# Patient Record
Sex: Female | Born: 1950
Health system: Southern US, Community
[De-identification: ages and names within clinical notes are randomized; demographics above are authoritative.]

## PROBLEM LIST (undated history)

## (undated) DIAGNOSIS — I350 Nonrheumatic aortic (valve) stenosis: Secondary | ICD-10-CM

## (undated) DIAGNOSIS — I4819 Other persistent atrial fibrillation: Secondary | ICD-10-CM

## (undated) DIAGNOSIS — H9190 Unspecified hearing loss, unspecified ear: Secondary | ICD-10-CM

## (undated) DIAGNOSIS — I10 Essential (primary) hypertension: Secondary | ICD-10-CM

## (undated) DIAGNOSIS — E039 Hypothyroidism, unspecified: Secondary | ICD-10-CM

## (undated) DIAGNOSIS — I499 Cardiac arrhythmia, unspecified: Secondary | ICD-10-CM

## (undated) DIAGNOSIS — Z87442 Personal history of urinary calculi: Secondary | ICD-10-CM

## (undated) DIAGNOSIS — N200 Calculus of kidney: Secondary | ICD-10-CM

## (undated) DIAGNOSIS — G20 Parkinson's disease: Secondary | ICD-10-CM

## (undated) DIAGNOSIS — R319 Hematuria, unspecified: Secondary | ICD-10-CM

## (undated) DIAGNOSIS — Z8719 Personal history of other diseases of the digestive system: Secondary | ICD-10-CM

## (undated) DIAGNOSIS — R0600 Dyspnea, unspecified: Secondary | ICD-10-CM

## (undated) DIAGNOSIS — G20A1 Parkinson's disease without dyskinesia, without mention of fluctuations: Secondary | ICD-10-CM

## (undated) DIAGNOSIS — K219 Gastro-esophageal reflux disease without esophagitis: Secondary | ICD-10-CM

## (undated) DIAGNOSIS — I4891 Unspecified atrial fibrillation: Secondary | ICD-10-CM

## (undated) DIAGNOSIS — G4730 Sleep apnea, unspecified: Secondary | ICD-10-CM

## (undated) DIAGNOSIS — E785 Hyperlipidemia, unspecified: Secondary | ICD-10-CM

## (undated) DIAGNOSIS — R011 Cardiac murmur, unspecified: Secondary | ICD-10-CM

## (undated) HISTORY — DX: Nonrheumatic aortic (valve) stenosis: I35.0

## (undated) HISTORY — PX: TONSILLECTOMY: SUR1361

## (undated) HISTORY — PX: KNEE ARTHROSCOPY: SUR90

## (undated) HISTORY — PX: VEIN LIGATION: SHX2652

## (undated) HISTORY — DX: Other persistent atrial fibrillation: I48.19

## (undated) HISTORY — DX: Hyperlipidemia, unspecified: E78.5

## (undated) HISTORY — DX: Essential (primary) hypertension: I10

## (undated) HISTORY — PX: ABDOMINAL HYSTERECTOMY: SHX81

## (undated) HISTORY — DX: Gastro-esophageal reflux disease without esophagitis: K21.9

---
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
## 1999-11-28 ENCOUNTER — Encounter: Payer: Self-pay | Admitting: Family Medicine

## 1999-11-28 ENCOUNTER — Encounter: Admission: RE | Admit: 1999-11-28 | Discharge: 1999-11-28 | Payer: Self-pay | Admitting: Family Medicine

## 2000-12-02 ENCOUNTER — Encounter: Payer: Self-pay | Admitting: Internal Medicine

## 2000-12-02 ENCOUNTER — Encounter: Admission: RE | Admit: 2000-12-02 | Discharge: 2000-12-02 | Payer: Self-pay | Admitting: Internal Medicine

## 2001-12-04 ENCOUNTER — Encounter: Admission: RE | Admit: 2001-12-04 | Discharge: 2001-12-04 | Payer: Self-pay | Admitting: Internal Medicine

## 2001-12-04 ENCOUNTER — Encounter: Payer: Self-pay | Admitting: Internal Medicine

## 2003-03-18 ENCOUNTER — Other Ambulatory Visit: Admission: RE | Admit: 2003-03-18 | Discharge: 2003-03-18 | Payer: Self-pay | Admitting: Family Medicine

## 2003-08-12 ENCOUNTER — Ambulatory Visit (HOSPITAL_COMMUNITY): Admission: RE | Admit: 2003-08-12 | Discharge: 2003-08-12 | Payer: Self-pay | Admitting: Family Medicine

## 2004-04-02 ENCOUNTER — Ambulatory Visit: Payer: Self-pay | Admitting: Family Medicine

## 2004-05-16 ENCOUNTER — Ambulatory Visit: Payer: Self-pay

## 2004-09-18 ENCOUNTER — Encounter: Admission: RE | Admit: 2004-09-18 | Discharge: 2004-09-18 | Payer: Self-pay | Admitting: Family Medicine

## 2004-10-08 ENCOUNTER — Ambulatory Visit: Payer: Self-pay | Admitting: Family Medicine

## 2005-09-23 ENCOUNTER — Encounter: Admission: RE | Admit: 2005-09-23 | Discharge: 2005-09-23 | Payer: Self-pay | Admitting: Family Medicine

## 2005-10-11 ENCOUNTER — Ambulatory Visit: Payer: Self-pay | Admitting: Family Medicine

## 2005-10-11 ENCOUNTER — Other Ambulatory Visit: Admission: RE | Admit: 2005-10-11 | Discharge: 2005-10-11 | Payer: Self-pay | Admitting: Family Medicine

## 2005-10-14 ENCOUNTER — Ambulatory Visit: Payer: Self-pay | Admitting: Family Medicine

## 2006-01-22 ENCOUNTER — Ambulatory Visit: Payer: Self-pay | Admitting: Family Medicine

## 2006-01-22 LAB — CONVERTED CEMR LAB: TSH: 3.56 microintl units/mL (ref 0.35–5.50)

## 2006-02-12 ENCOUNTER — Ambulatory Visit: Payer: Self-pay | Admitting: Family Medicine

## 2006-08-28 ENCOUNTER — Telehealth (INDEPENDENT_AMBULATORY_CARE_PROVIDER_SITE_OTHER): Payer: Self-pay | Admitting: *Deleted

## 2006-10-06 ENCOUNTER — Ambulatory Visit (HOSPITAL_COMMUNITY): Admission: RE | Admit: 2006-10-06 | Discharge: 2006-10-06 | Payer: Self-pay | Admitting: Family Medicine

## 2006-10-06 ENCOUNTER — Ambulatory Visit: Payer: Self-pay | Admitting: Family Medicine

## 2006-10-08 ENCOUNTER — Encounter (INDEPENDENT_AMBULATORY_CARE_PROVIDER_SITE_OTHER): Payer: Self-pay | Admitting: *Deleted

## 2006-10-08 LAB — CONVERTED CEMR LAB
BUN: 19 mg/dL (ref 6–23)
Basophils Relative: 0.8 % (ref 0.0–1.0)
Bilirubin, Direct: 0.1 mg/dL (ref 0.0–0.3)
CO2: 28 meq/L (ref 19–32)
Eosinophils Relative: 3.4 % (ref 0.0–5.0)
GFR calc Af Amer: 73 mL/min
Glucose, Bld: 103 mg/dL — ABNORMAL HIGH (ref 70–99)
Hemoglobin: 14.2 g/dL (ref 12.0–15.0)
Lymphocytes Relative: 25.9 % (ref 12.0–46.0)
Monocytes Absolute: 0.6 10*3/uL (ref 0.2–0.7)
Monocytes Relative: 10.4 % (ref 3.0–11.0)
Neutro Abs: 3.7 10*3/uL (ref 1.4–7.7)
Potassium: 4.5 meq/L (ref 3.5–5.1)
Sodium: 142 meq/L (ref 135–145)
TSH: 4.21 microintl units/mL (ref 0.35–5.50)
Total Protein: 6.4 g/dL (ref 6.0–8.3)

## 2007-03-25 ENCOUNTER — Encounter (INDEPENDENT_AMBULATORY_CARE_PROVIDER_SITE_OTHER): Payer: Self-pay | Admitting: *Deleted

## 2007-05-01 ENCOUNTER — Telehealth (INDEPENDENT_AMBULATORY_CARE_PROVIDER_SITE_OTHER): Payer: Self-pay | Admitting: *Deleted

## 2007-10-07 ENCOUNTER — Ambulatory Visit (HOSPITAL_COMMUNITY): Admission: RE | Admit: 2007-10-07 | Discharge: 2007-10-07 | Payer: Self-pay | Admitting: *Deleted

## 2008-10-12 ENCOUNTER — Ambulatory Visit (HOSPITAL_COMMUNITY): Admission: RE | Admit: 2008-10-12 | Discharge: 2008-10-12 | Payer: Self-pay | Admitting: Family Medicine

## 2009-10-24 ENCOUNTER — Ambulatory Visit (HOSPITAL_COMMUNITY): Admission: RE | Admit: 2009-10-24 | Discharge: 2009-10-24 | Payer: Self-pay | Admitting: Family Medicine

## 2011-04-16 ENCOUNTER — Other Ambulatory Visit (HOSPITAL_COMMUNITY): Payer: Self-pay | Admitting: Family Medicine

## 2011-04-16 DIAGNOSIS — Z1231 Encounter for screening mammogram for malignant neoplasm of breast: Secondary | ICD-10-CM

## 2011-05-09 ENCOUNTER — Ambulatory Visit (HOSPITAL_COMMUNITY)
Admission: RE | Admit: 2011-05-09 | Discharge: 2011-05-09 | Disposition: A | Discharge status: Home / Self Care | Payer: BC Managed Care – PPO | Source: Ambulatory Visit | Attending: Family Medicine | Admitting: Family Medicine

## 2011-05-09 DIAGNOSIS — Z1231 Encounter for screening mammogram for malignant neoplasm of breast: Secondary | ICD-10-CM | POA: Insufficient documentation

## 2012-01-30 ENCOUNTER — Other Ambulatory Visit: Payer: Self-pay | Admitting: Family Medicine

## 2012-01-30 DIAGNOSIS — R1032 Left lower quadrant pain: Secondary | ICD-10-CM

## 2012-02-03 ENCOUNTER — Ambulatory Visit
Admission: RE | Admit: 2012-02-03 | Discharge: 2012-02-03 | Disposition: A | Discharge status: Home / Self Care | Payer: BC Managed Care – PPO | Source: Ambulatory Visit | Attending: Family Medicine | Admitting: Family Medicine

## 2012-02-03 DIAGNOSIS — R1032 Left lower quadrant pain: Secondary | ICD-10-CM

## 2012-02-03 MED ORDER — IOHEXOL 300 MG/ML  SOLN
125.0000 mL | Freq: Once | INTRAMUSCULAR | Status: AC | PRN
  Administered 2012-02-03: 125 mL via INTRAVENOUS

## 2012-02-10 ENCOUNTER — Other Ambulatory Visit: Payer: Self-pay | Admitting: Urology

## 2012-02-25 ENCOUNTER — Encounter (HOSPITAL_BASED_OUTPATIENT_CLINIC_OR_DEPARTMENT_OTHER): Payer: Self-pay | Admitting: *Deleted

## 2012-02-25 NOTE — Progress Notes (Signed)
Pt instructed npo p mn 02/27/12 x omeprazole, bisoprolol, levothyroxine w sip of water.  Ok to take pain med prn w sip of water.  To [facility] 1/3 @ 1100.  Needs istat, ekg on arrival.

## 2012-02-28 ENCOUNTER — Ambulatory Visit (HOSPITAL_BASED_OUTPATIENT_CLINIC_OR_DEPARTMENT_OTHER)
Admission: RE | Admit: 2012-02-28 | Discharge: 2012-02-28 | Disposition: A | Discharge status: Home / Self Care | Payer: BC Managed Care – PPO | Source: Ambulatory Visit | Attending: Urology | Admitting: Urology

## 2012-02-28 ENCOUNTER — Encounter (HOSPITAL_BASED_OUTPATIENT_CLINIC_OR_DEPARTMENT_OTHER): Payer: Self-pay | Admitting: Certified Registered"

## 2012-02-28 ENCOUNTER — Encounter (HOSPITAL_BASED_OUTPATIENT_CLINIC_OR_DEPARTMENT_OTHER)
Admission: RE | Disposition: A | Discharge status: Home / Self Care | Payer: Self-pay | Source: Ambulatory Visit | Attending: Urology

## 2012-02-28 ENCOUNTER — Ambulatory Visit (HOSPITAL_BASED_OUTPATIENT_CLINIC_OR_DEPARTMENT_OTHER): Payer: BC Managed Care – PPO | Admitting: Certified Registered"

## 2012-02-28 ENCOUNTER — Other Ambulatory Visit: Payer: Self-pay

## 2012-02-28 DIAGNOSIS — R319 Hematuria, unspecified: Secondary | ICD-10-CM | POA: Insufficient documentation

## 2012-02-28 DIAGNOSIS — I10 Essential (primary) hypertension: Secondary | ICD-10-CM | POA: Insufficient documentation

## 2012-02-28 DIAGNOSIS — Z01810 Encounter for preprocedural cardiovascular examination: Secondary | ICD-10-CM | POA: Insufficient documentation

## 2012-02-28 DIAGNOSIS — N200 Calculus of kidney: Secondary | ICD-10-CM | POA: Insufficient documentation

## 2012-02-28 HISTORY — DX: Hematuria, unspecified: R31.9

## 2012-02-28 HISTORY — PX: CYSTOSCOPY/RETROGRADE/URETEROSCOPY/STONE EXTRACTION WITH BASKET: SHX5317

## 2012-02-28 HISTORY — PX: CYSTOSCOPY WITH STENT PLACEMENT: SHX5790

## 2012-02-28 HISTORY — DX: Cardiac murmur, unspecified: R01.1

## 2012-02-28 HISTORY — PX: HOLMIUM LASER APPLICATION: SHX5852

## 2012-02-28 HISTORY — DX: Hypothyroidism, unspecified: E03.9

## 2012-02-28 HISTORY — DX: Calculus of kidney: N20.0

## 2012-02-28 HISTORY — DX: Unspecified hearing loss, unspecified ear: H91.90

## 2012-02-28 HISTORY — PX: CYSTOSCOPY W/ RETROGRADES: SHX1426

## 2012-02-28 HISTORY — DX: Personal history of other diseases of the digestive system: Z87.19

## 2012-02-28 LAB — POCT I-STAT, CHEM 8
BUN: 15 mg/dL (ref 6–23)
Chloride: 105 mEq/L (ref 96–112)
Creatinine, Ser: 1 mg/dL (ref 0.50–1.10)
Potassium: 4 mEq/L (ref 3.5–5.1)
Sodium: 141 mEq/L (ref 135–145)

## 2012-02-28 SURGERY — HOLMIUM LASER APPLICATION
Anesthesia: General | Site: Ureter | Laterality: Right | Wound class: Clean Contaminated

## 2012-02-28 MED ORDER — FENTANYL CITRATE 0.05 MG/ML IJ SOLN
INTRAMUSCULAR | Status: DC | PRN
  Administered 2012-02-28: 50 ug via INTRAVENOUS
  Administered 2012-02-28 (×2): 25 ug via INTRAVENOUS

## 2012-02-28 MED ORDER — MIDAZOLAM HCL 5 MG/5ML IJ SOLN
INTRAMUSCULAR | Status: DC | PRN
  Administered 2012-02-28: 2 mg via INTRAVENOUS

## 2012-02-28 MED ORDER — OXYBUTYNIN CHLORIDE 5 MG PO TABS: 5.0000 mg | ORAL_TABLET | Freq: Three times a day (TID) | ORAL | Status: DC | PRN

## 2012-02-28 MED ORDER — DEXAMETHASONE SODIUM PHOSPHATE 4 MG/ML IJ SOLN
INTRAMUSCULAR | Status: DC | PRN
  Administered 2012-02-28: 8 mg via INTRAVENOUS

## 2012-02-28 MED ORDER — LACTATED RINGERS IV SOLN
INTRAVENOUS | Status: DC
  Filled 2012-02-28: qty 1000

## 2012-02-28 MED ORDER — SENNA-DOCUSATE SODIUM 8.6-50 MG PO TABS: 1.0000 | ORAL_TABLET | Freq: Two times a day (BID) | ORAL | Status: DC

## 2012-02-28 MED ORDER — PROMETHAZINE HCL 25 MG/ML IJ SOLN
6.2500 mg | INTRAMUSCULAR | Status: DC | PRN
  Filled 2012-02-28: qty 1

## 2012-02-28 MED ORDER — PROPOFOL 10 MG/ML IV BOLUS
INTRAVENOUS | Status: DC | PRN
  Administered 2012-02-28: 180 mg via INTRAVENOUS

## 2012-02-28 MED ORDER — GENTAMICIN IN SALINE 1.6-0.9 MG/ML-% IV SOLN
80.0000 mg | INTRAVENOUS | Status: AC
  Administered 2012-02-28: 400 mg via INTRAVENOUS
  Filled 2012-02-28: qty 50

## 2012-02-28 MED ORDER — KETOROLAC TROMETHAMINE 30 MG/ML IJ SOLN
INTRAMUSCULAR | Status: DC | PRN
  Administered 2012-02-28: 30 mg via INTRAVENOUS

## 2012-02-28 MED ORDER — FENTANYL CITRATE 0.05 MG/ML IJ SOLN
25.0000 ug | INTRAMUSCULAR | Status: DC | PRN
  Filled 2012-02-28: qty 1

## 2012-02-28 MED ORDER — ONDANSETRON HCL 4 MG/2ML IJ SOLN
INTRAMUSCULAR | Status: DC | PRN
  Administered 2012-02-28: 4 mg via INTRAVENOUS

## 2012-02-28 MED ORDER — SODIUM CHLORIDE 0.9 % IR SOLN
Status: DC | PRN
  Administered 2012-02-28: 3000 mL

## 2012-02-28 MED ORDER — IOHEXOL 350 MG/ML SOLN
INTRAVENOUS | Status: DC | PRN
  Administered 2012-02-28: 5 mL

## 2012-02-28 MED ORDER — OXYCODONE-ACETAMINOPHEN 5-325 MG PO TABS: 1.0000 | ORAL_TABLET | ORAL | Status: DC | PRN

## 2012-02-28 MED ORDER — LIDOCAINE HCL (CARDIAC) 20 MG/ML IV SOLN
INTRAVENOUS | Status: DC | PRN
  Administered 2012-02-28: 60 mg via INTRAVENOUS

## 2012-02-28 MED ORDER — LACTATED RINGERS IV SOLN
INTRAVENOUS | Status: DC
  Administered 2012-02-28 (×2): via INTRAVENOUS
  Filled 2012-02-28: qty 1000

## 2012-02-28 SURGICAL SUPPLY — 37 items
ADAPTER CATH URET PLST 4-6FR (CATHETERS) ×4 IMPLANT
BAG DRAIN URO-CYSTO SKYTR STRL (DRAIN) ×4 IMPLANT
BAG URO CATCHER STRL LF (DRAPE) ×4 IMPLANT
BASKET LASER NITINOL 1.9FR (BASKET) ×4 IMPLANT
BASKET STNLS GEMINI 4WIRE 3FR (BASKET) IMPLANT
BASKET ZERO TIP NITINOL 2.4FR (BASKET) IMPLANT
CANISTER SUCT LVC 12 LTR MEDI- (MISCELLANEOUS) ×4 IMPLANT
CATH FOLEY 2WAY  3CC  8FR (CATHETERS)
CATH FOLEY 2WAY 3CC 8FR (CATHETERS) IMPLANT
CATH INTERMIT  6FR 70CM (CATHETERS) ×4 IMPLANT
CLOTH BEACON ORANGE TIMEOUT ST (SAFETY) ×4 IMPLANT
DRAPE CAMERA CLOSED 9X96 (DRAPES) ×4 IMPLANT
ELECT REM PT RETURN 9FT ADLT (ELECTROSURGICAL)
ELECTRODE REM PT RTRN 9FT ADLT (ELECTROSURGICAL) IMPLANT
FLEXIVA TRACTIP ×4 IMPLANT
GLOVE BIO SURGEON STRL SZ 6.5 (GLOVE) ×4 IMPLANT
GLOVE BIO SURGEON STRL SZ7 (GLOVE) ×4 IMPLANT
GLOVE BIO SURGEON STRL SZ7.5 (GLOVE) ×4 IMPLANT
GLOVE INDICATOR 7.0 STRL GRN (GLOVE) ×8 IMPLANT
GOWN PREVENTION PLUS LG XLONG (DISPOSABLE) ×8 IMPLANT
GOWN STRL REIN XL XLG (GOWN DISPOSABLE) ×4 IMPLANT
GUIDEWIRE 0.038 PTFE COATED (WIRE) IMPLANT
GUIDEWIRE ANG ZIPWIRE 038X150 (WIRE) ×4 IMPLANT
GUIDEWIRE STR DUAL SENSOR (WIRE) ×4 IMPLANT
IV NS IRRIG 3000ML ARTHROMATIC (IV SOLUTION) ×4 IMPLANT
KIT BALLIN UROMAX 15FX10 (LABEL) IMPLANT
KIT BALLN UROMAX 15FX4 (MISCELLANEOUS) IMPLANT
KIT BALLN UROMAX 26 75X4 (MISCELLANEOUS)
PACK CYSTOSCOPY (CUSTOM PROCEDURE TRAY) ×4 IMPLANT
SET HIGH PRES BAL DIL (LABEL)
SHEATH ACCESS URETERAL 38CM (SHEATH) ×4 IMPLANT
SHEATH URET ACCESS 12FR/35CM (UROLOGICAL SUPPLIES) IMPLANT
SHEATH URET ACCESS 12FR/55CM (UROLOGICAL SUPPLIES) IMPLANT
STENT CONTOUR 6FRX24X.038 (STENTS) IMPLANT
STENT URET 6FRX24 CONTOUR (STENTS) ×4 IMPLANT
SYRINGE 10CC LL (SYRINGE) ×4 IMPLANT
SYRINGE IRR TOOMEY STRL 70CC (SYRINGE) IMPLANT

## 2012-02-28 NOTE — Anesthesia Preprocedure Evaluation (Signed)
Anesthesia Evaluation  Patient identified by MRN, date of birth, ID band Patient awake    Reviewed: Allergy & Precautions, H&P , NPO status , Patient's Chart, lab work & pertinent test results  Airway Mallampati: III TM Distance: >3 FB Neck ROM: Full    Dental  (+) Teeth Intact and Dental Advisory Given   Pulmonary neg pulmonary ROS,  breath sounds clear to auscultation  Pulmonary exam normal       Cardiovascular negative cardio ROS      Neuro/Psych negative neurological ROS  negative psych ROS   GI/Hepatic negative GI ROS, Neg liver ROS, hiatal hernia, GERD-  ,  Endo/Other  Hypothyroidism Morbid obesity  Renal/GU negative Renal ROS  negative genitourinary   Musculoskeletal negative musculoskeletal ROS (+) Fibromyalgia -  Abdominal (+) + obese,   Peds negative pediatric ROS (+)  Hematology negative hematology ROS (+)   Anesthesia Other Findings   Reproductive/Obstetrics negative OB ROS                           Anesthesia Physical Anesthesia Plan  ASA: III  Anesthesia Plan: General   Post-op Pain Management:    Induction: Intravenous  Airway Management Planned: LMA  Additional Equipment:   Intra-op Plan:   Post-operative Plan: Extubation in OR  Informed Consent: I have reviewed the patients History and Physical, chart, labs and discussed the procedure including the risks, benefits and alternatives for the proposed anesthesia with the patient or authorized representative who has indicated his/her understanding and acceptance.   Dental advisory given  Plan Discussed with: CRNA  Anesthesia Plan Comments:         Anesthesia Quick Evaluation

## 2012-02-28 NOTE — H&P (Signed)
Cindy Richmond is an 62 y.o. female.   Chief Complaint: Pre-Op Left Ureteroscopic Stone Manipulation  HPI:   1 - Left Renal Stone / Flank Pain / Hematuria -  Pt with 11mm left UPJ stone, 1100HU found by PCP on work-up of intermitant hematuria and flank pain on/off x several monthes. No prior stone events. No concomitant stones. Pain very infreqeuny, bothers for a day every few mos then goes away.  2 - Gross Hematuria - Intermitant gross blood on/off x last 6-94mos. Contrast CT abdomen (no delayed phase) 01/2012 wtih no large GU masses, but ureters / bladder not yet evaluated. Non-smoker. No dye / chemical exposure.   PMH sig for obesity, OA, hyothyroid, HTN. No CV disease. No strong blood thinners.  PT initially opted for observation of left stone, but has since decided for treatment given ongoing intermittent flank pain and blood.   Past Medical History  Diagnosis Date  . Hypothyroidism   . Kidney stone on left side   . Heart murmur     asymptomatic  . Fibromyalgia   . H/O hiatal hernia   . Hematuria   . HOH (hard of hearing)     sightly, bilaterally    Past Surgical History  Procedure Date  . Tonsillectomy   . Knee arthroscopy     right  . Abdominal hysterectomy     lso  . Vein ligation     History reviewed. No pertinent family history. Social History:  reports that she has never smoked. She does not have any smokeless tobacco history on file. She reports that she does not drink alcohol. Her drug history not on file.  Allergies:  Allergies  Allergen Reactions  . Penicillins Other (See Comments)    unknown    No prescriptions prior to admission    No results found for this or any previous visit (from the past 48 hour(s)). No results found.  Review of Systems  Constitutional: Negative.  Negative for fever and chills.  HENT: Negative.   Eyes: Negative.   Respiratory: Negative.   Cardiovascular: Negative.   Gastrointestinal: Negative.   Genitourinary: Positive  for hematuria and flank pain. Negative for dysuria.  Musculoskeletal: Negative.   Skin: Negative.   Neurological: Negative.   Endo/Heme/Allergies: Negative.  Does not bruise/bleed easily.  Psychiatric/Behavioral: Negative.     Height 5\' 6"  (1.676 m), weight 113.399 kg (250 lb). Physical Exam  Constitutional: She is oriented to person, place, and time. She appears well-developed and well-nourished.       obese  HENT:  Head: Normocephalic and atraumatic.  Eyes: EOM are normal. Pupils are equal, round, and reactive to light.  Neck: Normal range of motion. Neck supple.  Cardiovascular: Normal rate and regular rhythm.   Respiratory: Effort normal and breath sounds normal.  GI: Soft. Bowel sounds are normal.  Genitourinary:       Minimal CVAT  Musculoskeletal: Normal range of motion.  Neurological: She is alert and oriented to person, place, and time.  Skin: Skin is warm and dry.  Psychiatric: She has a normal mood and affect. Her behavior is normal. Judgment and thought content normal.     Assessment/Plan  1 - Left Renal Stone / Flank Pain / Hematuria -  LIkely source of intermitatn pain and hematuria. Not passable size. Pt body habitus and stone density make success of SWL not great.  We re-discussed treatment approaches to renal stones including observation, medical expulsive therapy (MET), ureteroscopy (URS), shockwave lithotripsy (SWL), and percutaneous  nephrostolithotomy (PCNL) with the later preferred for cases of larger stones or in cases of unique / complex anatomy. We discussed the relative merits of the techniques including risks, benefits, and predicted efficacy with PCNL, URS, and SWL being presented in order of decreasing efficacy, but also decreasing risk in most patients.  After careful consideration, the patient has elected to undergo URS with cysto / bilateral retrogrades to treat her stone and complete hematuria evaluation.   We re-discussed ureteroscopic stone  manipulation with basketing and laser-lithotripsy in detail.  We discussed risks including bleeding, infection, damage to kidney / ureter  bladder, rarely loss of kidney. We discussed anesthetic risks and rare but serious surgical complications including DVT, PE, MI, and mortality. We specifically addressed that in 5-10% of cases a staged approach is required with stenting followed by re-attempt ureteroscopy if anatomy unfavorable. The patient voiced understanding and wises to proceed.   Siyah Mault 02/28/2012, 6:38 AM

## 2012-02-28 NOTE — Anesthesia Postprocedure Evaluation (Signed)
  Anesthesia Post-op Note  Patient: Cindy Richmond  Procedure(s) Performed: Procedure(s) (LRB): HOLMIUM LASER APPLICATION (Left) CYSTOSCOPY WITH RETROGRADE PYELOGRAM (Right) CYSTOSCOPY/RETROGRADE/URETEROSCOPY/STONE EXTRACTION WITH BASKET (Left) CYSTOSCOPY WITH STENT PLACEMENT (Left)  Patient Location: PACU  Anesthesia Type: General  Level of Consciousness: awake and alert   Airway and Oxygen Therapy: Patient Spontanous Breathing  Post-op Pain: mild  Post-op Assessment: Post-op Vital signs reviewed, Patient's Cardiovascular Status Stable, Respiratory Function Stable, Patent Airway and No signs of Nausea or vomiting  Last Vitals:  Filed Vitals:   02/28/12 1047  BP: 135/86  Pulse: 71  Temp: 36.5 C  Resp: 18    Post-op Vital Signs: stable   Complications: No apparent anesthesia complications

## 2012-02-28 NOTE — Transfer of Care (Signed)
Immediate Anesthesia Transfer of Care Note  Patient: Cindy Richmond  Procedure(s) Performed: Procedure(s) (LRB): HOLMIUM LASER APPLICATION (Left) CYSTOSCOPY WITH RETROGRADE PYELOGRAM (Right) CYSTOSCOPY/RETROGRADE/URETEROSCOPY/STONE EXTRACTION WITH BASKET (Left) CYSTOSCOPY WITH STENT PLACEMENT (Left)  Patient Location: PACU  Anesthesia Type: General  Level of Consciousness: awake, oriented, sedated and patient cooperative  Airway & Oxygen Therapy: Patient Spontanous Breathing and Patient connected to face mask oxygen  Post-op Assessment: Report given to PACU RN and Post -op Vital signs reviewed and stable  Post vital signs: Reviewed and stable  Complications: No apparent anesthesia complications

## 2012-02-28 NOTE — Brief Op Note (Signed)
02/28/2012  2:10 PM  PATIENT:  Aleena P Desantis  62 y.o. female  PRE-OPERATIVE DIAGNOSIS:  left renal stone  POST-OPERATIVE DIAGNOSIS:  left renal stone  PROCEDURE:  Procedure(s) (LRB) with comments: HOLMIUM LASER APPLICATION (Left) CYSTOSCOPY WITH RETROGRADE PYELOGRAM (Right) CYSTOSCOPY/RETROGRADE/URETEROSCOPY/STONE EXTRACTION WITH BASKET (Left) CYSTOSCOPY WITH STENT PLACEMENT (Left)  SURGEON:  Surgeon(s) and Role:    * Sebastian Ache, MD - Primary  PHYSICIAN ASSISTANT:   ASSISTANTS: none   ANESTHESIA:   general  EBL:  Total I/O In: 1000 [I.V.:1000] Out: -   BLOOD ADMINISTERED:none  DRAINS: none   LOCAL MEDICATIONS USED:  NONE  SPECIMEN:  Source of Specimen:  Left Renal Stone  DISPOSITION OF SPECIMEN:  [facility] for compositional analysis  COUNTS:  YES  TOURNIQUET:  * No tourniquets in log *  DICTATION: .Other Dictation: Dictation Number [id number]  PLAN OF CARE: Discharge to home after PACU  PATIENT DISPOSITION:  PACU - hemodynamically stable.   Delay start of Pharmacological VTE agent (>24hrs) due to surgical blood loss or risk of bleeding: no

## 2012-02-28 NOTE — Anesthesia Procedure Notes (Signed)
Procedure Name: LMA Insertion Date/Time: 02/28/2012 12:58 PM Performed by: Renella Cunas D Pre-anesthesia Checklist: Patient identified, Emergency Drugs available, Suction available and Patient being monitored Patient Re-evaluated:Patient Re-evaluated prior to inductionOxygen Delivery Method: Circle System Utilized Preoxygenation: Pre-oxygenation with 100% oxygen Intubation Type: IV induction Ventilation: Mask ventilation without difficulty LMA: LMA inserted LMA Size: 4.0 Number of attempts: 1 Airway Equipment and Method: bite block Placement Confirmation: positive ETCO2 Tube secured with: Tape Dental Injury: Teeth and Oropharynx as per pre-operative assessment

## 2012-03-02 NOTE — Op Note (Signed)
[name], Richmond NO.:  [id number]  MEDICAL RECORD NO.:  [id number]  LOCATION:                                 FACILITY:  PHYSICIAN:  Sebastian Ache, MD          DATE OF BIRTH:  DATE OF PROCEDURE:02/28/2012 DATE OF DISCHARGE:                              OPERATIVE REPORT   DIAGNOSES:  Large left renal stone, hematuria and flank pain.  PROCEDURES: 1. Cystoscopy with bilateral retrograde pyelograms and interpretation. 2. Left ureteroscopic stone manipulation with laser lithotripsy. 3. Left ureteral stent placement, 6 x 24, no tether.  FINDINGS: 1. Unremarkable bilateral retrograde pyelograms. 2. Large left intrarenal stone within the renal pelvis.  DRAINS:  None.  SPECIMEN:  Left renal stone fragments for compositional analysis.  ESTIMATED BLOOD LOSS:  Nil.  INDICATIONS:  Ms. Cindy Richmond is a pleasant 62 year old lady with recent history of gross hematuria.  She was found on workup of this to have a large left intrarenal stone.  She had axial imaging with CT urogram; however, her ureters were not suitably opacified.  We discussed the options and she then wished to proceed with left ureteroscopic stone manipulation with concomitant retrograde pyelograms to complete her hematuria workup.  Informed consent was obtained and placed in the medical record.  PROCEDURE IN DETAIL:  The patient being Cindy Richmond, was verified. Procedure being left ureteroscopic stone manipulation with bilateral retrograde pyelograms was confirmed.  Procedure was carried out.  Time- out was performed.  Intravenous antibiotics were administered.  General LMA anesthesia was introduced.  The patient was placed into a low lithotomy position.  Sterile field was created by prepping and draping the patient's vagina, introitus and proximal thighs using iodine x3. Next, cystourethroscopy was performed using a 22-French rigid cystoscope with 12-degree offset lens.  Inspection of the urinary  bladder revealed no diverticula, calcifications, papillary lesions.  Ureteral orifices were in normal anatomic position.  Next, the right ureteral orifice was gently cannulated using a 6-French end-hole catheter and right retrograde pyelogram was seen.  Right retrograde pyelogram demonstrated a single right ureter, single system right kidney.  No filling defects or narrowing was noted. Similarly, the left retrograde pyelogram was obtained.  Left retrograde pyelogram demonstrated a single left ureter, single system left kidney.  There was a filling defect in the left renal pelvis consistent with known stone.  No hydroureteronephrosis noted.  A 0.038 Glidewire was advanced at the level of the upper pole and set aside as a safety wire.  Next, the cystoscope was exchanged for the 6.4-French semi- rigid ureteroscope.  Then, semi-rigid ureteroscopy was performed at the entire length of left ureter alongside a second Sensor working wire using normal saline under pressure.  No filling defects or calcifications were noted.  Next, the ureteroscope was exchanged for a 36-cm 12/14 ureteral access sheath, which was placed under continuous fluoroscopy.  Next, flexible digital ureteroscopy was performed at the entire left kidney.  A single large calcification was found within the left renal pelvis consistent with known stone.  Holmium laser energy was then applied to the stone using settings of 0.5 joules and 10 hertz, and the stone was  fragmented into pieces approximately 3 mm or less in diameter.  Next, an escape-type basket was used to grasp these fragments and removed them out sequentially and set aside for compositional analysis.  Repeat panendoscopy revealed no residual stone fragments larger than 0.5 mm and no evidence of perforation.  Excellent hemostasis was noted.  The sheath was removed under continuous ureteroscopy and no mucosal abnormalities or additional calcifications were found.   Next, a new 6 x 24 double-J stent was placed through the remaining safety wire using fluoroscopic guidance.  Good proximal and distal curl were noted. Bladder was emptied per cystoscope.  Procedure was then terminated.  The patient tolerated the procedure well.  There were no immediate periprocedural complications.  The patient was taken to the postanesthesia care unit in stable condition.          ______________________________ Sebastian Ache, MD     TM/MEDQ  D:  02/28/2012  T:  02/28/2012  Job:  [id number]

## 2012-03-03 ENCOUNTER — Encounter (HOSPITAL_BASED_OUTPATIENT_CLINIC_OR_DEPARTMENT_OTHER): Payer: Self-pay | Admitting: Urology

## 2012-11-04 ENCOUNTER — Other Ambulatory Visit (HOSPITAL_COMMUNITY): Payer: Self-pay | Admitting: Family Medicine

## 2012-11-04 DIAGNOSIS — Z1231 Encounter for screening mammogram for malignant neoplasm of breast: Secondary | ICD-10-CM

## 2012-11-06 ENCOUNTER — Ambulatory Visit (HOSPITAL_COMMUNITY)
Admission: RE | Admit: 2012-11-06 | Discharge: 2012-11-06 | Disposition: A | Discharge status: Home / Self Care | Payer: BC Managed Care – PPO | Source: Ambulatory Visit | Attending: Family Medicine | Admitting: Family Medicine

## 2012-11-06 DIAGNOSIS — Z1231 Encounter for screening mammogram for malignant neoplasm of breast: Secondary | ICD-10-CM

## 2012-11-10 ENCOUNTER — Other Ambulatory Visit: Payer: Self-pay | Admitting: Family Medicine

## 2012-11-10 DIAGNOSIS — R928 Other abnormal and inconclusive findings on diagnostic imaging of breast: Secondary | ICD-10-CM

## 2012-11-25 ENCOUNTER — Ambulatory Visit
Admission: RE | Admit: 2012-11-25 | Discharge: 2012-11-25 | Disposition: A | Discharge status: Home / Self Care | Payer: BC Managed Care – PPO | Source: Ambulatory Visit | Attending: Family Medicine | Admitting: Family Medicine

## 2012-11-25 DIAGNOSIS — R928 Other abnormal and inconclusive findings on diagnostic imaging of breast: Secondary | ICD-10-CM

## 2013-03-12 ENCOUNTER — Other Ambulatory Visit: Payer: Self-pay | Admitting: Family Medicine

## 2013-03-12 DIAGNOSIS — N6320 Unspecified lump in the left breast, unspecified quadrant: Secondary | ICD-10-CM

## 2013-06-01 ENCOUNTER — Ambulatory Visit
Admission: RE | Admit: 2013-06-01 | Discharge: 2013-06-01 | Disposition: A | Discharge status: Home / Self Care | Payer: Self-pay | Source: Ambulatory Visit | Attending: Family Medicine | Admitting: Family Medicine

## 2013-06-01 ENCOUNTER — Ambulatory Visit
Admission: RE | Admit: 2013-06-01 | Discharge: 2013-06-01 | Disposition: A | Discharge status: Home / Self Care | Payer: Medicare Other | Source: Ambulatory Visit | Attending: Family Medicine | Admitting: Family Medicine

## 2013-06-01 DIAGNOSIS — N6320 Unspecified lump in the left breast, unspecified quadrant: Secondary | ICD-10-CM

## 2013-11-03 ENCOUNTER — Other Ambulatory Visit (HOSPITAL_COMMUNITY): Payer: Self-pay | Admitting: Family Medicine

## 2013-11-03 ENCOUNTER — Other Ambulatory Visit: Payer: Self-pay | Admitting: Family Medicine

## 2013-11-03 DIAGNOSIS — Z1231 Encounter for screening mammogram for malignant neoplasm of breast: Secondary | ICD-10-CM

## 2013-11-03 DIAGNOSIS — N6320 Unspecified lump in the left breast, unspecified quadrant: Secondary | ICD-10-CM

## 2013-11-10 ENCOUNTER — Ambulatory Visit
Admission: RE | Admit: 2013-11-10 | Discharge: 2013-11-10 | Disposition: A | Discharge status: Home / Self Care | Payer: Medicare Other | Source: Ambulatory Visit | Attending: Family Medicine | Admitting: Family Medicine

## 2013-11-10 DIAGNOSIS — N6320 Unspecified lump in the left breast, unspecified quadrant: Secondary | ICD-10-CM

## 2014-04-26 ENCOUNTER — Other Ambulatory Visit: Payer: Self-pay | Admitting: Family Medicine

## 2014-04-26 DIAGNOSIS — N6489 Other specified disorders of breast: Secondary | ICD-10-CM

## 2014-05-03 ENCOUNTER — Ambulatory Visit
Admission: RE | Admit: 2014-05-03 | Discharge: 2014-05-03 | Disposition: A | Discharge status: Home / Self Care | Payer: Medicare Other | Source: Ambulatory Visit | Attending: Family Medicine | Admitting: Family Medicine

## 2014-05-03 ENCOUNTER — Other Ambulatory Visit: Payer: Self-pay | Admitting: Family Medicine

## 2014-05-03 DIAGNOSIS — N6489 Other specified disorders of breast: Secondary | ICD-10-CM

## 2014-06-08 ENCOUNTER — Ambulatory Visit: Payer: Medicare Other | Attending: Family Medicine

## 2014-06-08 DIAGNOSIS — M79605 Pain in left leg: Secondary | ICD-10-CM | POA: Insufficient documentation

## 2014-06-08 DIAGNOSIS — M25652 Stiffness of left hip, not elsewhere classified: Secondary | ICD-10-CM | POA: Insufficient documentation

## 2014-06-08 DIAGNOSIS — R269 Unspecified abnormalities of gait and mobility: Secondary | ICD-10-CM | POA: Diagnosis not present

## 2014-06-08 DIAGNOSIS — M797 Fibromyalgia: Secondary | ICD-10-CM | POA: Insufficient documentation

## 2014-06-08 DIAGNOSIS — E039 Hypothyroidism, unspecified: Secondary | ICD-10-CM | POA: Diagnosis not present

## 2014-06-08 DIAGNOSIS — M25562 Pain in left knee: Secondary | ICD-10-CM

## 2014-06-08 NOTE — Therapy (Signed)
[facility] [address], [address] [place], [place], [address] Phone: [phone]   Fax:  [phone]  Physical Therapy Evaluation  Patient Details  Name: Cindy Richmond MRN: [id number] Date of Birth: [birth year] Referring Provider:  Farris Has, MD  Encounter Date: 06/08/2014      PT End of Session - 06/08/14 1311    Visit Number 1   Number of Visits 10  medicare   Date for PT Re-Evaluation 08/03/14   PT Start Time 1231   PT Stop Time 1308   PT Time Calculation (min) 37 min   Activity Tolerance Patient tolerated treatment well   Behavior During Therapy [facility] for tasks assessed/performed      Past Medical History  Diagnosis Date  . Hypothyroidism   . Kidney stone on left side   . Heart murmur     asymptomatic  . Fibromyalgia   . H/O hiatal hernia   . Hematuria   . HOH (hard of hearing)     sightly, bilaterally    Past Surgical History  Procedure Laterality Date  . Tonsillectomy    . Knee arthroscopy      right  . Abdominal hysterectomy      lso  . Vein ligation    . Holmium laser application  02/28/2012    Procedure: HOLMIUM LASER APPLICATION;  Surgeon: Sebastian Ache, MD;  Location: [facility];  Service: Urology;  Laterality: Left;  . Cystoscopy w/ retrogrades  02/28/2012    Procedure: CYSTOSCOPY WITH RETROGRADE PYELOGRAM;  Surgeon: Sebastian Ache, MD;  Location: [facility];  Service: Urology;  Laterality: Right;  . Cystoscopy/retrograde/ureteroscopy/stone extraction with basket  02/28/2012    Procedure: CYSTOSCOPY/RETROGRADE/URETEROSCOPY/STONE EXTRACTION WITH BASKET;  Surgeon: Sebastian Ache, MD;  Location: [facility];  Service: Urology;  Laterality: Left;  . Cystoscopy with stent placement  02/28/2012    Procedure: CYSTOSCOPY WITH STENT PLACEMENT;  Surgeon: Sebastian Ache, MD;  Location: [facility];  Service: Urology;  Laterality: Left;    There were  no vitals filed for this visit.  Visit Diagnosis:  Pain in joint, lower leg, left - Plan: PT plan of care cert/re-cert  Abnormality of gait - Plan: PT plan of care cert/re-cert  Hip stiffness, left - Plan: PT plan of care cert/re-cert      Subjective Assessment - 06/08/14 1237    Subjective Pt reports to PT with Lt LE pain that began ~3 weeks ago after helping at her church.  Woke up the next day with pain.     Pertinent History Rt knee scope   Limitations Standing;Walking   How long can you stand comfortably? 5 minutes in the kitchen  (15 minutes-goal)   How long can you walk comfortably? 5 minutes (15 minutes-goal)   Patient Stated Goals reduce Lt leg pain to improve standing/walking   Currently in Pain? Yes   Pain Score 5    Pain Location Leg  from ankle to hip   Pain Orientation Left   Pain Descriptors / Indicators Aching;Burning;Shooting;Sore;Sharp   Pain Type Acute pain   Pain Onset 1 to 4 weeks ago   Pain Frequency Intermittent   Aggravating Factors  standing, walking   Pain Relieving Factors sitting, topical rub   Multiple Pain Sites No            [facility] PT Assessment - 06/08/14 0001    Assessment   Medical Diagnosis leg pain (M79.606)   Onset Date 05/18/14  Next MD Visit none scheduled   Prior Therapy none   Precautions   Precautions Fall  cane    Restrictions   Weight Bearing Restrictions No   Balance Screen   Has the patient fallen in the past 6 months No   Has the patient had a decrease in activity level because of a fear of falling?  No   Is the patient reluctant to leave their home because of a fear of falling?  No   Home Environment   Living Enviornment Private residence   Home Access Stairs to enter   Home Layout --  bedroom is down stairs   Prior Function   Level of Independence Independent with basic ADLs   Vocation Full time employment   Leisure walking, travel   Cognition   Overall Cognitive Status Within Functional Limits for tasks  assessed   Observation/Other Assessments   Focus on Therapeutic Outcomes (FOTO)  63% limitation   Posture/Postural Control   Posture/Postural Control No significant limitations   ROM / Strength   AROM / PROM / Strength AROM;Strength;PROM   AROM   Overall AROM  Within functional limits for tasks performed   PROM   Overall PROM  Deficits   Overall PROM Comments Hip flexibility limited by 25% in all motions Lt=Rt, bilateral knee PROM  Rt=Lt   PROM Assessment Site Knee;Hip   Strength   Overall Strength Deficits   Overall Strength Comments Bilateral hip strength 4+/5, knees 4+/5   Strength Assessment Site Knee;Hip   Palpation   Palpation No significant palpable tenderness today   Transfers   Transfers Sit to Stand   Sit to Stand With upper extremity assist   Ambulation/Gait   Ambulation/Gait Yes   Ambulation/Gait Assistance 6: Modified independent (Device/Increase time)   Ambulation Distance (Feet) 100 Feet   Assistive device Straight cane   Gait Pattern Decreased stance time - left;Decreased step length - left                           PT Education - 06/08/14 1304    Education provided Yes   Education Details single knee to chest, long arc quads, heel slides, hamstring stretch   Person(s) Educated Patient   Methods Explanation;Handout;Demonstration   Comprehension Verbalized understanding;Returned demonstration          PT Short Term Goals - 06/08/14 1313    PT SHORT TERM GOAL #1   Title be independent in initial HEP   Time 4   Period Weeks   Status New   PT SHORT TERM GOAL #2   Title stand and walk for 7-10 minutes without limitation   Time 4   Period Weeks   Status New   PT SHORT TERM GOAL #3   Title report 40% less Lt LE pain with standing tasks   Time 4   Period Weeks   Status New   PT SHORT TERM GOAL #4   Title demonstrate symmetry with ambulation on level surface   Time 4   Period Weeks   Status New           PT Long Term Goals  - 06/08/14 1314    PT LONG TERM GOAL #1   Title be independent in advanced HEP   Time 8   Period Weeks   Status New   PT LONG TERM GOAL #2   Title reduce FOTO to < or = to 43% limitaiton   Time 8  Period Weeks   Status New   PT LONG TERM GOAL #3   Title stand and walk for 15 minutes without limitation   Time 8   Period Weeks   Status New   PT LONG TERM GOAL #4   Title report 75% reduced Lt LE pain with standing and walking   Time 8   Period Weeks   Status New               Plan - 29-Jun-2014 1311    Clinical Impression Statement Pt with Lt LE pain, stiffness and gait abnormality. Pt will benefit from PT to return to regular standing and walking tolerance.   Pt will benefit from skilled therapeutic intervention in order to improve on the following deficits Abnormal gait;Decreased range of motion;Difficulty walking;Decreased endurance;Decreased activity tolerance;Pain;Decreased strength   Rehab Potential Good   PT Frequency 2x / week   PT Duration 8 weeks   PT Treatment/Interventions ADLs/Self Care Home Management;Moist Heat;Therapeutic activities;Passive range of motion;Therapeutic exercise;Ultrasound;Gait training;Manual techniques;Electrical Stimulation;Cryotherapy;Neuromuscular re-education   PT Next Visit Plan Lt LE flexibility and strength, gait training.  Modalities PRN   Consulted and Agree with Plan of Care Patient          G-Codes - 2014-06-29 1250    Functional Assessment Tool Used FOTO:  63% limitation   Functional Limitation Other PT primary   Other PT Primary Current Status (Z6109) At least 60 percent but less than 80 percent impaired, limited or restricted   Other PT Primary Goal Status (U0454) At least 40 percent but less than 60 percent impaired, limited or restricted       Problem List There are no active problems to display for this patient.   Kastin Cerda, PT 06-29-2014, 1:22 PM  [facility] [address] [place], [place], [address] Phone: [phone]   Fax:  [phone]

## 2014-06-08 NOTE — Patient Instructions (Signed)
Knee to Chest   Lying supine, bend involved knee to chest.  Hold 20 seconds,  _3__ times. Repeat with other leg. Do __2-3_ times per day.  HIP / KNEE: Flexion, Heel Slides - Supine   Slide heel up toward buttocks, keeping leg in straight line.  Hold 10 seconds,. __5_ reps per set, _2-3__ sets per day Use towel or pillowcase under heel as needed.  Copyright  VHI. All rights reserved.   KNEE: Extension, Long Arc Quads - Sitting   Raise leg until knee is straight.  Hold 5 seconds 10-20___ reps per set, _2-3__ sets per day  Copyright  VHI. All rights reserved.  HIP: Hamstrings - Short Sitting   Rest leg on raised surface. Keep knee straight. Lift chest. Hold _20__ seconds. _2__ reps per set, _3__ sets per day  Copyright  VHI. All rights reserved.

## 2014-06-15 ENCOUNTER — Ambulatory Visit: Payer: Medicare Other

## 2014-06-15 DIAGNOSIS — M25562 Pain in left knee: Secondary | ICD-10-CM

## 2014-06-15 DIAGNOSIS — M79605 Pain in left leg: Secondary | ICD-10-CM | POA: Diagnosis not present

## 2014-06-15 DIAGNOSIS — R269 Unspecified abnormalities of gait and mobility: Secondary | ICD-10-CM

## 2014-06-15 DIAGNOSIS — M25652 Stiffness of left hip, not elsewhere classified: Secondary | ICD-10-CM

## 2014-06-15 NOTE — Therapy (Signed)
[facility] [address], [address] [place], [place], [address] Phone: [phone]   Fax:  [phone]  Physical Therapy Treatment  Patient Details  Name: Cindy HopperMyla P Badman MRN: [id number] Date of Birth: 01/06/1951 Referring Provider:  Lelon PerlaLowne, Yvonne R, DO  Encounter Date: 06/15/2014      PT End of Session - 06/15/14 1308    Visit Number 2   Number of Visits 10  Medicare   Date for PT Re-Evaluation 08/03/14   PT Start Time 1230   PT Stop Time 1304   PT Time Calculation (min) 34 min   Activity Tolerance Patient tolerated treatment well   Behavior During Therapy [facility] for tasks assessed/performed      Past Medical History  Diagnosis Date  . Hypothyroidism   . Kidney stone on left side   . Heart murmur     asymptomatic  . Fibromyalgia   . H/O hiatal hernia   . Hematuria   . HOH (hard of hearing)     sightly, bilaterally    Past Surgical History  Procedure Laterality Date  . Tonsillectomy    . Knee arthroscopy      right  . Abdominal hysterectomy      lso  . Vein ligation    . Holmium laser application  02/28/2012    Procedure: HOLMIUM LASER APPLICATION;  Surgeon: Sebastian Acheheodore Manny, MD;  [facility] [place];  Service: Urology;  Laterality: Left;  . Cystoscopy w/ retrogrades  02/28/2012    Procedure: CYSTOSCOPY WITH RETROGRADE PYELOGRAM;  Surgeon: Sebastian Acheheodore Manny, MD;  [facility] [place];  Service: Urology;  Laterality: Right;  . Cystoscopy/retrograde/ureteroscopy/stone extraction with basket  02/28/2012    Procedure: CYSTOSCOPY/RETROGRADE/URETEROSCOPY/STONE EXTRACTION WITH BASKET;  Surgeon: Sebastian Acheheodore Manny, MD;  [facility] [place];  Service: Urology;  Laterality: Left;  . Cystoscopy with stent placement  02/28/2012    Procedure: CYSTOSCOPY WITH STENT PLACEMENT;  Surgeon: Sebastian Acheheodore Manny, MD;  [facility] [place];  Service: Urology;  Laterality: Left;    There were  no vitals filed for this visit.  Visit Diagnosis:  Pain in joint, lower leg, left  Abnormality of gait  Hip stiffness, left      Subjective Assessment - 06/15/14 1232    Subjective Pt denies any pain today.  Some "soreness" reported sometimes during the day.     Currently in Pain? Yes   Pain Score 1    Pain Location Leg   Pain Orientation Left   Pain Descriptors / Indicators Sore   Pain Type Acute pain   Pain Onset 1 to 4 weeks ago   Aggravating Factors  standing and walking   Pain Relieving Factors sitting, topical rub   Multiple Pain Sites No                         [facility] Adult PT Treatment/Exercise - 06/15/14 0001    Exercises   Exercises Knee/Hip   Knee/Hip Exercises: Stretches   Active Hamstring Stretch 3 reps;20 seconds  seated and supine with strap   Knee/Hip Exercises: Standing   Other Standing Knee Exercises standing hip abduction & extension 2x10 bil. each   Knee/Hip Exercises: Seated   Long Arc Quad Strengthening;Left;2 sets;10 reps   Knee/Hip Exercises: Supine   Short Arc Quad Sets AAROM;Strengthening;Both;20 reps   Heel Slides AAROM;Left;1 set;10 reps   Modalities   Modalities Ultrasound   Ultrasound   Ultrasound Location Lt medial/distal  patella   Ultrasound Parameters 1.2 w/cm2 continuous x 8 minutes   Ultrasound Goals Pain                  PT Short Term Goals - 06/15/14 1236    PT SHORT TERM GOAL #1   Title be independent in initial HEP   Time 4   Period Weeks   Status Achieved  independent with current HEP   PT SHORT TERM GOAL #2   Title stand and walk for 7-10 minutes without limitation   Time 4   Period Weeks   Status Achieved  10 minutes max   PT SHORT TERM GOAL #4   Title demonstrate symmetry with ambulation on level surface   Time 4   Period Weeks   Status On-going  Pt demonstrates mild assymmetry on level surface           PT Long Term Goals - 06/08/14 1314    PT LONG TERM GOAL #1   Title be  independent in advanced HEP   Time 8   Period Weeks   Status New   PT LONG TERM GOAL #2   Title reduce FOTO to < or = to 43% limitaiton   Time 8   Period Weeks   Status New   PT LONG TERM GOAL #3   Title stand and walk for 15 minutes without limitation   Time 8   Period Weeks   Status New   PT LONG TERM GOAL #4   Title report 75% reduced Lt LE pain with standing and walking   Time 8   Period Weeks   Status New               Plan - 06/15/14 1308    Pt will benefit from skilled therapeutic intervention in order to improve on the following deficits Abnormal gait;Decreased range of motion;Difficulty walking;Decreased endurance;Decreased activity tolerance;Pain;Decreased strength        Problem List There are no active problems to display for this patient.   Sachi Boulay, PT 06/15/2014, 1:10 PM  [facility] [address] [place], [place], [address] Phone: [phone]   Fax:  [phone]

## 2014-06-22 ENCOUNTER — Encounter: Payer: Self-pay | Admitting: Physical Therapy

## 2014-06-22 ENCOUNTER — Ambulatory Visit: Payer: Medicare Other | Admitting: Physical Therapy

## 2014-06-22 DIAGNOSIS — R269 Unspecified abnormalities of gait and mobility: Secondary | ICD-10-CM

## 2014-06-22 DIAGNOSIS — M79605 Pain in left leg: Secondary | ICD-10-CM | POA: Diagnosis not present

## 2014-06-22 DIAGNOSIS — M25652 Stiffness of left hip, not elsewhere classified: Secondary | ICD-10-CM

## 2014-06-22 DIAGNOSIS — M25562 Pain in left knee: Secondary | ICD-10-CM

## 2014-06-22 NOTE — Therapy (Addendum)
[facility] [address] [place], [place], [address] Phone: [phone]   Fax:  [phone]  Physical Therapy Treatment  Patient Details  Name: Cindy Richmond MRN: [id number] Date of Birth: 12-25-1950 Referring Provider:  Rosalita Chessman, DO  Encounter Date: 06/22/2014      PT End of Session - 06/22/14 1307    Visit Number 3   Number of Visits 10   Date for PT Re-Evaluation 08/03/14   PT Start Time 1238   PT Stop Time 1315   PT Time Calculation (min) 37 min   Activity Tolerance Patient tolerated treatment well   Behavior During Therapy [facility] for tasks assessed/performed      Past Medical History  Diagnosis Date  . Hypothyroidism   . Kidney stone on left side   . Heart murmur     asymptomatic  . Fibromyalgia   . H/O hiatal hernia   . Hematuria   . HOH (hard of hearing)     sightly, bilaterally    Past Surgical History  Procedure Laterality Date  . Tonsillectomy    . Knee arthroscopy      right  . Abdominal hysterectomy      lso  . Vein ligation    . Holmium laser application  2/0/3559    Procedure: HOLMIUM LASER APPLICATION;  Surgeon: Alexis Frock, MD;  [facility];  Service: Urology;  Laterality: Left;  . Cystoscopy w/ retrogrades  02/28/2012    Procedure: CYSTOSCOPY WITH RETROGRADE PYELOGRAM;  Surgeon: Alexis Frock, MD;  [facility];  Service: Urology;  Laterality: Right;  . Cystoscopy/retrograde/ureteroscopy/stone extraction with basket  02/28/2012    Procedure: CYSTOSCOPY/RETROGRADE/URETEROSCOPY/STONE EXTRACTION WITH BASKET;  Surgeon: Alexis Frock, MD;  [facility];  Service: Urology;  Laterality: Left;  . Cystoscopy with stent placement  02/28/2012    Procedure: CYSTOSCOPY WITH STENT PLACEMENT;  Surgeon: Alexis Frock, MD;  Location: [facility];  Service: Urology;  Laterality: Left;    There were no vitals  filed for this visit.  Visit Diagnosis:  Pain in joint, lower leg, left  Hip stiffness, left  Abnormality of gait      Subjective Assessment - 06/22/14 1240    Subjective Knee is sore today. Pt stood for many hours at church this weekend.    Currently in Pain? Yes   Pain Score 4    Pain Location Leg   Pain Orientation Left   Pain Descriptors / Indicators Sore   Pain Type Acute pain   Aggravating Factors  Walking and standing   Pain Relieving Factors Sitting   Multiple Pain Sites No                         [facility] Adult PT Treatment/Exercise - 06/22/14 0001    Knee/Hip Exercises: Stretches   Active Hamstring Stretch 3 reps;20 seconds  seated and supine with strap   Knee/Hip Exercises: Standing   Other Standing Knee Exercises standing hip abduction & extension 2x10 bil. each   Knee/Hip Exercises: Seated   Long Arc Quad Strengthening;Both;2 sets;10 reps;Weights   Long Arc Quad Weight 1 lbs.   Knee/Hip Exercises: Supine   Short Arc Quad Sets Strengthening;Both;2 sets;10 reps   Short Arc Quad Sets Limitations Added 1#   Ultrasound   Ultrasound Location Lt medial, distal knee   Ultrasound Parameters 1.2 w/cm   Ultrasound Goals Pain  PT Short Term Goals - 06/22/14 1306    PT SHORT TERM GOAL #1   Title be independent in initial HEP   Time 4   Period Weeks   Status Achieved   PT SHORT TERM GOAL #2   Title stand and walk for 7-10 minutes without limitation   Time 4   Period Weeks   Status Achieved   PT SHORT TERM GOAL #3   Title report 40% less Lt LE pain with standing tasks   Time 4   Period Weeks   Status Achieved  60% per pt.    PT SHORT TERM GOAL #4   Title demonstrate symmetry with ambulation on level surface   Time 4   Period Weeks   Status On-going           PT Long Term Goals - 06/08/14 1314    PT LONG TERM GOAL #1   Title be independent in advanced HEP   Time 8   Period Weeks   Status New   PT LONG  TERM GOAL #2   Title reduce FOTO to < or = to 43% limitaiton   Time 8   Period Weeks   Status New   PT LONG TERM GOAL #3   Title stand and walk for 15 minutes without limitation   Time 8   Period Weeks   Status New   PT LONG TERM GOAL #4   Title report 75% reduced Lt LE pain with standing and walking   Time 8   Period Weeks   Status New               Plan - 06/22/14 1308    Clinical Impression Statement Pt met pain goal with standing today. Continues to improve doing HEP.    Pt will benefit from skilled therapeutic intervention in order to improve on the following deficits Abnormal gait;Decreased range of motion;Difficulty walking;Decreased endurance;Decreased activity tolerance;Pain;Decreased strength   Rehab Potential Good   PT Frequency 2x / week   PT Duration 8 weeks   PT Treatment/Interventions ADLs/Self Care Home Management;Moist Heat;Therapeutic activities;Passive range of motion;Therapeutic exercise;Ultrasound;Gait training;Manual techniques;Electrical Stimulation;Cryotherapy;Neuromuscular re-education   PT Next Visit Plan Lt LE flexibility and strength, gait training, continue ultrasound    Consulted and Agree with Plan of Care Patient        Problem List There are no active problems to display for this patient.   Aladdin Kollmann, PTA 06/22/2014, 1:12 PM PHYSICAL THERAPY DISCHARGE SUMMARY  Visits from Start of Care: 3  Current functional level related to goals / functional outcomes: Pt attended 3 PT sessions and then called to cancel all remaining appts.     Remaining deficits: Unknown as pt didn't return to PT.     Education / Equipment: HEP Plan: Patient agrees to discharge.  Patient goals were partially met. Patient is being discharged due to the patient's request.  ?????   Sigurd Sos, PT 08/02/2014 8:43 AM [facility] [address], [place], [place], [address] Phone:  [phone]   Fax:  [phone]

## 2014-06-29 ENCOUNTER — Encounter: Payer: Medicare Other | Admitting: Physical Therapy

## 2014-07-06 ENCOUNTER — Encounter: Payer: Medicare Other | Admitting: Physical Therapy

## 2014-12-09 ENCOUNTER — Other Ambulatory Visit: Payer: Self-pay

## 2014-12-09 DIAGNOSIS — Z1231 Encounter for screening mammogram for malignant neoplasm of breast: Secondary | ICD-10-CM

## 2015-01-05 ENCOUNTER — Ambulatory Visit
Admission: RE | Admit: 2015-01-05 | Discharge: 2015-01-05 | Disposition: A | Discharge status: Home / Self Care | Payer: Medicare Other | Source: Ambulatory Visit

## 2015-01-05 DIAGNOSIS — Z1231 Encounter for screening mammogram for malignant neoplasm of breast: Secondary | ICD-10-CM

## 2016-01-24 ENCOUNTER — Other Ambulatory Visit: Payer: Self-pay

## 2016-01-24 ENCOUNTER — Other Ambulatory Visit: Payer: Self-pay | Admitting: Family Medicine

## 2016-01-24 ENCOUNTER — Ambulatory Visit (HOSPITAL_COMMUNITY): Payer: Medicare Other | Attending: Cardiology

## 2016-01-24 DIAGNOSIS — R011 Cardiac murmur, unspecified: Secondary | ICD-10-CM

## 2016-01-24 DIAGNOSIS — I350 Nonrheumatic aortic (valve) stenosis: Secondary | ICD-10-CM | POA: Insufficient documentation

## 2016-02-08 ENCOUNTER — Telehealth: Payer: Self-pay | Admitting: *Deleted

## 2016-02-08 NOTE — Telephone Encounter (Signed)
NOTES SENT TO SCHEDULING ON 02/08/16

## 2016-02-09 ENCOUNTER — Telehealth: Payer: Self-pay

## 2016-02-09 NOTE — Telephone Encounter (Signed)
NOTES SENT TO SCHEDULING.  °

## 2016-02-25 NOTE — Progress Notes (Signed)
HPI: 65 yo female for evaluation of AS. Echo 11/17 showed normal LV function, moderate LVH, moderate AS with mean gradient 24 mmHg and mild RAE. Patient notes dyspnea after walking 50 yards and increased fatigue during the day. No orthopnea, PND or pedal edema. She does not have exertional chest pain or syncope. Because of the above cardiology asked to evaluate.   Current Outpatient Prescriptions  Medication Sig Dispense Refill  . bisoprolol-hydrochlorothiazide (ZIAC) 5-6.25 MG per tablet Take 1 tablet by mouth daily.    Marland Kitchen. esomeprazole (NEXIUM) 20 MG capsule Take 20 mg by mouth daily at 12 noon.    . etodolac (LODINE) 400 MG tablet Take 400 mg by mouth 2 (two) times daily.    . fish oil-omega-3 fatty acids 1000 MG capsule Take 2 g by mouth daily.    Marland Kitchen. levothyroxine (SYNTHROID, LEVOTHROID) 75 MCG tablet Take 75 mcg by mouth daily.     No current facility-administered medications for this visit.     Allergies  Allergen Reactions  . Penicillins Other (See Comments)    unknown     Past Medical History:  Diagnosis Date  . Aortic stenosis   . H/O hiatal hernia   . Heart murmur    asymptomatic  . Hematuria   . HOH (hard of hearing)    sightly, bilaterally  . Hyperlipidemia   . Hypertension   . Hypothyroidism   . Kidney stone on left side     Past Surgical History:  Procedure Laterality Date  . ABDOMINAL HYSTERECTOMY     lso  . CYSTOSCOPY W/ RETROGRADES  02/28/2012   Procedure: CYSTOSCOPY WITH RETROGRADE PYELOGRAM;  Surgeon: Sebastian Acheheodore Manny, MD;  [facility] [place];  Service: Urology;  Laterality: Right;  . CYSTOSCOPY WITH STENT PLACEMENT  02/28/2012   Procedure: CYSTOSCOPY WITH STENT PLACEMENT;  Surgeon: Sebastian Acheheodore Manny, MD;  [facility] [place];  Service: Urology;  Laterality: Left;  . CYSTOSCOPY/RETROGRADE/URETEROSCOPY/STONE EXTRACTION WITH BASKET  02/28/2012   Procedure: CYSTOSCOPY/RETROGRADE/URETEROSCOPY/STONE EXTRACTION WITH BASKET;   Surgeon: Sebastian Acheheodore Manny, MD;  [facility] [place];  Service: Urology;  Laterality: Left;  . HOLMIUM LASER APPLICATION  02/28/2012   Procedure: HOLMIUM LASER APPLICATION;  Surgeon: Sebastian Acheheodore Manny, MD;  [facility] [place];  Service: Urology;  Laterality: Left;  . KNEE ARTHROSCOPY     right  . TONSILLECTOMY    . VEIN LIGATION      Social History   Social History  . Marital status: Widowed    Spouse name: N/A  . Number of children: 3  . Years of education: N/A   Occupational History  . Not on file.   Social History Main Topics  . Smoking status: Never Smoker  . Smokeless tobacco: Never Used  . Alcohol use Yes     Comment: Occasional  . Drug use: Unknown  . Sexual activity: Not on file   Other Topics Concern  . Not on file   Social History Narrative  . No narrative on file    Family History  Problem Relation Age of Onset  . Heart failure Father     ROS: Fatigue but no fevers or chills, productive cough, hemoptysis, dysphasia, odynophagia, melena, hematochezia, dysuria, hematuria, rash, seizure activity, orthopnea, PND, pedal edema, claudication. Remaining systems are negative.  Physical Exam:   Blood pressure 130/80, pulse 70, height 5\' 6"  (1.676 m), weight 283 lb (128.4 kg).  General:  Well developed/obese in NAD Skin warm/dry Patient not depressed No  peripheral clubbing Back-normal HEENT-normal/normal eyelids Neck supple/normal carotid upstroke bilaterally; no bruits; no JVD; no thyromegaly chest - CTA/ normal expansion CV - RRR/normal S1 and S2; no rubs or gallops;  PMI nondisplaced, 2/6 systolic murmur left sternal border. S2 is not diminished.  Abdomen -NT/ND, no HSM, no mass, + bowel sounds, no bruit 2+ femoral pulses, no bruits Ext-no edema, chords, 2+ DP Neuro-grossly nonfocal  ECG -sinus rhythm at a rate of 70. Incomplete right bundle branch block. Left ventricular hypertrophy.  A/P  1 Aortic stenosis-patient's  aortic stenosis is moderate on echocardiogram and does not sound severe on examination. She will need follow-up echocardiogram in 1 year. I have explained that she will likely require aortic valve replacement in the future. I do not think it is contributing to her dyspnea at present.  2 dyspnea-this is likely multifactorial including obesity, deconditioning and possibly obstructive sleep apnea. We have discussed the importance of exercise and weight loss. I will refer her to pulmonary for sleep study and treatment of sleep apnea if indicated.  3 hypertension-blood pressure controlled. Continue present medications.  4 Morbid obesity-We discussed weight loss and diet.  Olga MillersBrian Crenshaw, MD

## 2016-03-01 ENCOUNTER — Encounter: Payer: Self-pay | Admitting: Cardiology

## 2016-03-01 ENCOUNTER — Ambulatory Visit (INDEPENDENT_AMBULATORY_CARE_PROVIDER_SITE_OTHER): Payer: Medicare Other | Admitting: Cardiology

## 2016-03-01 VITALS — BP 130/80 | HR 70 | Ht 66.0 in | Wt 283.0 lb

## 2016-03-01 DIAGNOSIS — I350 Nonrheumatic aortic (valve) stenosis: Secondary | ICD-10-CM

## 2016-03-01 DIAGNOSIS — G4730 Sleep apnea, unspecified: Secondary | ICD-10-CM | POA: Diagnosis not present

## 2016-03-01 DIAGNOSIS — R0600 Dyspnea, unspecified: Secondary | ICD-10-CM

## 2016-03-01 NOTE — Patient Instructions (Signed)
Medication Instructions:   NO CHANGE  Follow-Up:  Your physician wants you to follow-up in: 6 MONTHS WITH DR CRENSHAW You will receive a reminder letter in the mail two months in advance. If you don't receive a letter, please call our office to schedule the follow-up appointment.   REFERRAL TO PULMONARY FOR SLEEP APNEA   

## 2016-03-05 NOTE — Addendum Note (Signed)
Addended by: Domingos Riggi L on: 03/05/2016 10:05 AM   Modules accepted: Orders  

## 2016-03-06 NOTE — Addendum Note (Signed)
Addended by: Yara Tomkinson L on: 03/06/2016 08:14 AM   Modules accepted: Orders  

## 2016-03-19 ENCOUNTER — Other Ambulatory Visit: Payer: Self-pay | Admitting: Family Medicine

## 2016-03-19 DIAGNOSIS — Z1231 Encounter for screening mammogram for malignant neoplasm of breast: Secondary | ICD-10-CM

## 2016-04-10 ENCOUNTER — Ambulatory Visit: Payer: 59

## 2016-04-22 ENCOUNTER — Encounter: Payer: Self-pay | Admitting: Internal Medicine

## 2016-04-22 ENCOUNTER — Encounter (INDEPENDENT_AMBULATORY_CARE_PROVIDER_SITE_OTHER): Payer: Self-pay

## 2016-04-22 ENCOUNTER — Ambulatory Visit (INDEPENDENT_AMBULATORY_CARE_PROVIDER_SITE_OTHER)
Admission: RE | Admit: 2016-04-22 | Discharge: 2016-04-22 | Disposition: A | Discharge status: Home / Self Care | Payer: Medicare Other | Source: Ambulatory Visit | Attending: Internal Medicine | Admitting: Internal Medicine

## 2016-04-22 ENCOUNTER — Ambulatory Visit (INDEPENDENT_AMBULATORY_CARE_PROVIDER_SITE_OTHER): Payer: Medicare Other | Admitting: Internal Medicine

## 2016-04-22 VITALS — BP 128/80 | HR 68 | Ht 63.0 in | Wt 285.0 lb

## 2016-04-22 DIAGNOSIS — I350 Nonrheumatic aortic (valve) stenosis: Secondary | ICD-10-CM | POA: Insufficient documentation

## 2016-04-22 DIAGNOSIS — R0609 Other forms of dyspnea: Secondary | ICD-10-CM | POA: Diagnosis not present

## 2016-04-22 DIAGNOSIS — G4733 Obstructive sleep apnea (adult) (pediatric): Secondary | ICD-10-CM

## 2016-04-22 DIAGNOSIS — F514 Sleep terrors [night terrors]: Secondary | ICD-10-CM | POA: Diagnosis not present

## 2016-04-22 NOTE — Progress Notes (Signed)
04/22/2016-66 year old female never smoker referred courtesy of Dr. Jens Som with question of OSA. Medical problems include moderate AS, morbid obesity, HBP, dyspnea on exertion, hypothyroid Widowed for 15 years but husband and sister both had told her she snores loudly. Sleeping alone now. Bothersome daytime sleepiness-never finishes a TV show but okay driving. No sleeping pills. 2 cups of coffee daily. Concerned about dyspnea on exertion which has been present probably for months with no acute event. Not aware of any pulmonary history. Describes "night terrors" for at least much of her adult life-about 8 times she has thrown herself out of bed onto the floor. No sleepwalking. ENT surgery limited to tonsils. Office Spirometry 04/23/2811-mild restriction-FVC 2.22/75%, FEV1 1.85/83%, ratio 0.83.  Prior to Admission medications   Medication Sig Start Date End Date Taking? Authorizing Provider  bisoprolol-hydrochlorothiazide (ZIAC) 5-6.25 MG per tablet Take 1 tablet by mouth daily.   Yes Historical Provider, MD  cholecalciferol (VITAMIN D) 1000 units tablet Take 2,000 Units by mouth daily.   Yes Historical Provider, MD  esomeprazole (NEXIUM) 20 MG capsule Take 20 mg by mouth daily at 12 noon.   Yes Historical Provider, MD  etodolac (LODINE) 400 MG tablet Take 400 mg by mouth 2 (two) times daily.   Yes Historical Provider, MD  fish oil-omega-3 fatty acids 1000 MG capsule Take 2 g by mouth daily.   Yes Historical Provider, MD  Glucosamine-Chondroit-Vit C-Mn (GLUCOSAMINE 1500 COMPLEX) CAPS Take by mouth.   Yes Historical Provider, MD  levothyroxine (SYNTHROID, LEVOTHROID) 75 MCG tablet Take 75 mcg by mouth daily.   Yes Historical Provider, MD  vitamin B-12 (CYANOCOBALAMIN) 500 MCG tablet Take 500 mcg by mouth daily.   Yes Historical Provider, MD   Past Medical History:  Diagnosis Date  . Aortic stenosis   . H/O hiatal hernia   . Heart murmur    asymptomatic  . Hematuria   . HOH (hard of hearing)     sightly, bilaterally  . Hyperlipidemia   . Hypertension   . Hypothyroidism   . Kidney stone on left side    Past Surgical History:  Procedure Laterality Date  . ABDOMINAL HYSTERECTOMY     lso  . CYSTOSCOPY W/ RETROGRADES  02/28/2012   Procedure: CYSTOSCOPY WITH RETROGRADE PYELOGRAM;  Surgeon: Sebastian Ache, MD;  Location[facility];  Service: Urology;  Laterality: Right;  . CYSTOSCOPY WITH STENT PLACEMENT  02/28/2012   Procedure: CYSTOSCOPY WITH STENT PLACEMENT;  Surgeon: Sebastian Ache, MD;  [facility];  Service: Urology;  Laterality: Left;  . CYSTOSCOPY/RETROGRADE/URETEROSCOPY/STONE EXTRACTION WITH BASKET  02/28/2012   Procedure: CYSTOSCOPY/RETROGRADE/URETEROSCOPY/STONE EXTRACTION WITH BASKET;  Surgeon: Sebastian Ache, MD;  Location: [facility];  Service: Urology;  Laterality: Left;  . HOLMIUM LASER APPLICATION  02/28/2012   Procedure: HOLMIUM LASER APPLICATION;  Surgeon: Sebastian Ache, MD;  [facility];  Service: Urology;  Laterality: Left;  . KNEE ARTHROSCOPY     right  . TONSILLECTOMY    . VEIN LIGATION     Family History  Problem Relation Age of Onset  . Heart failure Father    Social History   Social History  . Marital status: Widowed    Spouse name: N/A  . Number of children: 3  . Years of education: N/A   Occupational History  . Not on file.   Social History Main Topics  . Smoking status: Never Smoker  . Smokeless tobacco: Never Used  . Alcohol use Yes  Comment: Occasional  . Drug use: Unknown  . Sexual activity: Not on file   Other Topics Concern  . Not on file   Social History Narrative  . No narrative on file   ROS-see HPI   Negative unless "+" Constitutional:    weight loss, night sweats, fevers, chills, + fatigue, lassitude. HEENT:    headaches, difficulty swallowing, tooth/dental problems, sore throat,       sneezing, itching, ear ache, nasal congestion, post nasal  drip, snoring CV:    chest pain, orthopnea, PND, swelling in lower extremities, anasarca,                                                           dizziness, palpitations Resp:   + shortness of breath with exertion or at rest.                productive cough,   non-productive cough, coughing up of blood.              change in color of mucus.  wheezing.   Skin:    rash or lesions. GI:  No-   heartburn, indigestion, abdominal pain, nausea, vomiting, diarrhea,                 change in bowel habits, loss of appetite GU: dysuria, change in color of urine, no urgency or frequency.   flank pain. MS:   joint pain, stiffness, decreased range of motion, back pain. Neuro-     nothing unusual Psych:  change in mood or affect.  depression or anxiety.   memory loss.  OBJ- Physical Exam General- Alert, Oriented, Affect-appropriate, Distress- none acute, + obese Skin- rash-none, lesions- none, excoriation- none Lymphadenopathy- none Head- atraumatic            Eyes- Gross vision intact, PERRLA, conjunctivae and secretions clear            Ears- Hearing, canals-normal            Nose- Clear, no-Septal dev, mucus, polyps, erosion, perforation             Throat- Mallampati IV , mucosa clear , drainage- none, tonsils- atrophic, + own teeth Neck- flexible , trachea midline, no stridor , thyroid nl, carotid no bruit Chest - symmetrical excursion , unlabored           Heart/CV- RRR ,  Murmur + 2/6 syst  Ao space , no gallop  , no rub, nl s1 s2                           - JVD- none , edema- none, stasis changes- none, varices- none           Lung- clear to P&A, wheeze- none, cough- none , dullness-none, rub- none           Chest wall-  Abd-  Br/ Gen/ Rectal- Not done, not indicated Extrem- cyanosis- none, clubbing, none, atrophy- none, strength- nl Neuro- grossly intact to observation

## 2016-04-22 NOTE — Assessment & Plan Note (Signed)
She is significantly overweight, impacting her other medical problems. Weight loss is encouraged.

## 2016-04-22 NOTE — Assessment & Plan Note (Signed)
This degree of restriction is consistent with her body habitus and presumption of obesity hypoventilation. Dyspnea likely multifactorial, reflecting obesity, deconditioning, aortic stenosis. She denies history of significant anemia. Plan- CXR

## 2016-04-22 NOTE — Assessment & Plan Note (Signed)
She describes a total of probably 8 events going back at least to her teens. With these she has thrown herself out of bed. Not clear if this is a true parasomnia or reflects abrupt respiratory-induced arousals from sleep. I doubt seizure. Plan-sleep study

## 2016-04-22 NOTE — Assessment & Plan Note (Signed)
Defined by Dr. Crenshaw/cardiology as moderate. No intervention for now, being followed annually.

## 2016-04-22 NOTE — Assessment & Plan Note (Signed)
Probable diagnosis based on exam and history. Complicated by cardiac disease and her history of night terrors. Plan-schedule split protocol sleep study

## 2016-04-22 NOTE — Patient Instructions (Signed)
Order- schedudle split protocol NPSG   Dx OSA, night terrors  Order- CXR    Dx dyspnea on exertion  Order- office spirometry     We will arrange a return visit here for you after your sleep study, to go over results

## 2016-04-26 ENCOUNTER — Ambulatory Visit
Admission: RE | Admit: 2016-04-26 | Discharge: 2016-04-26 | Disposition: A | Discharge status: Home / Self Care | Payer: Medicare Other | Source: Ambulatory Visit | Attending: Family Medicine | Admitting: Family Medicine

## 2016-04-26 DIAGNOSIS — Z1231 Encounter for screening mammogram for malignant neoplasm of breast: Secondary | ICD-10-CM

## 2016-06-10 ENCOUNTER — Encounter (HOSPITAL_BASED_OUTPATIENT_CLINIC_OR_DEPARTMENT_OTHER): Payer: Medicare Other

## 2016-06-30 ENCOUNTER — Ambulatory Visit (HOSPITAL_BASED_OUTPATIENT_CLINIC_OR_DEPARTMENT_OTHER): Payer: Medicare Other | Attending: Internal Medicine | Admitting: Internal Medicine

## 2016-06-30 DIAGNOSIS — G4733 Obstructive sleep apnea (adult) (pediatric): Secondary | ICD-10-CM | POA: Insufficient documentation

## 2016-06-30 DIAGNOSIS — F514 Sleep terrors [night terrors]: Secondary | ICD-10-CM

## 2016-07-02 DIAGNOSIS — G4733 Obstructive sleep apnea (adult) (pediatric): Secondary | ICD-10-CM | POA: Diagnosis not present

## 2016-07-02 NOTE — Procedures (Signed)
Patient Name: Cindy Richmond, Kearl Date: 06/30/2016 Gender: Female D.O.B: 1950-11-23 Age (years): 11 Referring Provider: Baird Lyons MD, ABSM Height (inches): 65 Interpreting Physician: Baird Lyons MD, ABSM Weight (lbs): 280 RPSGT: Joni Reining BMI: 29 MRN: [id number] Neck Size: 16.00 CLINICAL INFORMATION Sleep Study Type: Split Night CPAP  Indication for sleep study: Excessive Daytime Sleepiness, Fatigue, Hypertension, OSA, Sleep walking/talking/parasomnias, Snoring  Epworth Sleepiness Score: 12  SLEEP STUDY TECHNIQUE As per the AASM Manual for the Scoring of Sleep and Associated Events v2.3 (April 2016) with a hypopnea requiring 4% desaturations.  The channels recorded and monitored were frontal, central and occipital EEG, electrooculogram (EOG), submentalis EMG (chin), nasal and oral airflow, thoracic and abdominal wall motion, anterior tibialis EMG, snore microphone, electrocardiogram, and pulse oximetry. Continuous positive airway pressure (CPAP) was initiated when the patient met split night criteria and was titrated according to treat sleep-disordered breathing.  MEDICATIONS Medications self-administered by patient taken the night of the study : NONE REPORTED  RESPIRATORY PARAMETERS Diagnostic  Total AHI (/hr): 46.4 RDI (/hr): 54.4 OA Index (/hr): 12.9 CA Index (/hr): 0.0 REM AHI (/hr): 18.5 NREM AHI (/hr): 48.3 Supine AHI (/hr): N/A Non-supine AHI (/hr): 46.39 Min O2 Sat (%): 87.00 Mean O2 (%): 93.36 Time below 88% (min): 0.4   Titration  Optimal Pressure (cm): 13 AHI at Optimal Pressure (/hr): 0.0 Min O2 at Optimal Pressure (%): 93.0 Supine % at Optimal (%): 0 Sleep % at Optimal (%): 95   SLEEP ARCHITECTURE The recording time for the entire night was 378.5 minutes.  During a baseline period of 183.2 minutes, the patient slept for 134.5 minutes in REM and nonREM, yielding a sleep efficiency of 73.4%. Sleep onset after lights out was 11.9 minutes with a REM  latency of 134.5 minutes. The patient spent 24.54% of the night in stage N1 sleep, 70.63% in stage N2 sleep, 0.00% in stage N3 and 4.83% in REM.  During the titration period of 178.2 minutes, the patient slept for 102.0 minutes in REM and nonREM, yielding a sleep efficiency of 57.3%. Sleep onset after CPAP initiation was 42.0 minutes with a REM latency of 23.0 minutes. The patient spent 9.31% of the night in stage N1 sleep, 60.78% in stage N2 sleep, 0.00% in stage N3 and 29.90% in REM.  CARDIAC DATA The 2 lead EKG demonstrated sinus rhythm. The mean heart rate was 65.20 beats per minute. Other EKG findings include: PVCs.  LEG MOVEMENT DATA The total Periodic Limb Movements of Sleep (PLMS) were 38. The PLMS index was 9.64 .  IMPRESSIONS - Severe obstructive sleep apnea occurred during the diagnostic portion of the study (AHI = 46.4/hour). An optimal PAP pressure was selected for this patient ( 13 cm of water) - No significant central sleep apnea occurred during the diagnostic portion of the study (CAI = 0.0/hour). - Mild oxygen desaturation was noted during the diagnostic portion of the study (Min O2 = 87.00%). - The patient snored with Loud snoring volume during the diagnostic portion of the study. - EKG findings include PVCs. - Mild periodic limb movements of sleep occurred during the study. - Patient history on arrival described sleep walking, acting out dreams. This was not observed during current study. Consider confusional arousals due to respiratory events, or possibly REM Behavior Disorder.  DIAGNOSIS - Obstructive Sleep Apnea (327.23 [G47.33 ICD-10])  RECOMMENDATIONS - Trial of CPAP therapy on 13 cm H2O with a Medium size Fisher&Paykel Nasal Mask Eson2 mask and heated humidification. - Be careful with alcohol,  sedatives and other CNS depressants that may worsen sleep apnea and disrupt normal sleep architecture. - Sleep hygiene should be reviewed to assess factors that may improve  sleep quality. - Weight management and regular exercise should be initiated or continued. - Note if parasomnias are prevented by controlling respiratory arousals.  [Electronically signed] 07/02/2016 01:17 PM  Baird Lyons MD, [place], American Board of Sleep Medicine   NPI: [id number]  [place], American Board of Sleep Medicine  ELECTRONICALLY SIGNED ON:  07/02/2016, 1:13 PM [place] PH: [phone]   FX: [phone] [place]

## 2016-07-25 ENCOUNTER — Ambulatory Visit (INDEPENDENT_AMBULATORY_CARE_PROVIDER_SITE_OTHER): Payer: Medicare Other | Admitting: Internal Medicine

## 2016-07-25 ENCOUNTER — Encounter: Payer: Self-pay | Admitting: Internal Medicine

## 2016-07-25 VITALS — BP 128/68 | HR 65 | Ht 65.0 in | Wt 290.0 lb

## 2016-07-25 DIAGNOSIS — G4733 Obstructive sleep apnea (adult) (pediatric): Secondary | ICD-10-CM | POA: Diagnosis not present

## 2016-07-25 DIAGNOSIS — F514 Sleep terrors [night terrors]: Secondary | ICD-10-CM | POA: Diagnosis not present

## 2016-07-25 NOTE — Patient Instructions (Addendum)
Order- new DME, new CPAP auto 5-20, mask of choice, humidifier, supplies AirView     Dx OSA  Please call as needed

## 2016-07-25 NOTE — Progress Notes (Signed)
HPI  female never smoker followed for OSA, complicated by ASA, morbid obesity, HBP, DOE, hypothyroid NPSG 06/30/16- Severe OSA-AHI 46.4/hour, CPAP titration to 13 CWP, desaturation to 87%, body weight 280 pounds Office Spirometry 04/23/2811-mild restriction-FVC 2.22/75%, FEV1 1.85/83%, ratio 0.83. -----------------------------------------------------------------------------  04/22/2016-66 year old female never smoker referred courtesy of Dr. Jens Som with question of OSA. Medical problems include moderate AS, morbid obesity, HBP, dyspnea on exertion, hypothyroid Widowed for 15 years but husband and sister both had told her she snores loudly. Sleeping alone now. Bothersome daytime sleepiness-never finishes a TV show but okay driving. No sleeping pills. 2 cups of coffee daily. Concerned about dyspnea on exertion which has been present probably for months with no acute event. Not aware of any pulmonary history. Describes "night terrors" for at least much of her adult life-about 8 times she has thrown herself out of bed onto the floor. No sleepwalking. ENT surgery limited to tonsils. Office Spirometry 04/23/2811-mild restriction-FVC 2.22/75%, FEV1 1.85/83%, ratio 0.83.  07/25/16- 66 year old female never smoker followed for OSA, complicated by ASA, morbid obesity, HBP, DOE, hypothyroid NPSG 06/30/16- Severe OSA-AHI 46.4/hour, CPAP titration to 13 CWP, desaturation to 87%, body weight 280 pounds RESULTS  OF THE SLEEP STUDY  patient wants to go over the sleep study  We reviewed her sleep study, discussed the basics and treatment choices. Plan-CPAP auto 5-20  ROS-see HPI   Negative unless "+" Constitutional:    weight loss, night sweats, fevers, chills, + fatigue, lassitude. HEENT:    headaches, difficulty swallowing, tooth/dental problems, sore throat,       sneezing, itching, ear ache, nasal congestion, post nasal drip, snoring CV:    chest pain, orthopnea, PND, swelling in lower extremities, anasarca,                                                            dizziness, palpitations Resp:   + shortness of breath with exertion or at rest.                productive cough,   non-productive cough, coughing up of blood.              change in color of mucus.  wheezing.   Skin:    rash or lesions. GI:  No-   heartburn, indigestion, abdominal pain, nausea, vomiting, diarrhea,                 change in bowel habits, loss of appetite GU: dysuria, change in color of urine, no urgency or frequency.   flank pain. MS:   joint pain, stiffness, decreased range of motion, back pain. Neuro-     nothing unusual Psych:  change in mood or affect.  depression or anxiety.   memory loss.  OBJ- Physical Exam General- Alert, Oriented, Affect-appropriate, Distress- none acute, + obese Skin- rash-none, lesions- none, excoriation- none Lymphadenopathy- none Head- atraumatic            Eyes- Gross vision intact, PERRLA, conjunctivae and secretions clear            Ears- Hearing, canals-normal            Nose- Clear, no-Septal dev, mucus, polyps, erosion, perforation             Throat- Mallampati IV , mucosa clear , drainage- none, tonsils-  atrophic, + own teeth Neck- flexible , trachea midline, no stridor , thyroid nl, carotid no bruit Chest - symmetrical excursion , unlabored           Heart/CV- RRR ,  Murmur + 2/6 syst  Ao space , no gallop  , no rub, nl s1 s2                           - JVD- none , edema- none, stasis changes- none, varices- none           Lung- clear to P&A, wheeze- none, cough- none , dullness-none, rub- none           Chest wall-  Abd-  Br/ Gen/ Rectal- Not done, not indicated Extrem- cyanosis- none, clubbing, none, atrophy- none, strength- nl Neuro- grossly intact to observation

## 2016-07-28 NOTE — Assessment & Plan Note (Signed)
These events were not noted during her sleep study and may reflect partial arousals due to sleep disordered breathing. We will reassess as needed.

## 2016-07-28 NOTE — Assessment & Plan Note (Signed)
Importance of weight control emphasized. Consider bariatric referral.

## 2016-07-28 NOTE — Assessment & Plan Note (Addendum)
We discussed options. CPAP is appropriate initial therapy for her to try at auto 5-20. We discussed comfort issues and goals and reinforced importance of achieving normal body weight, reemphasize in her responsibility to drive safely. Plan new CPAP auto 5-20

## 2016-09-24 ENCOUNTER — Encounter: Payer: Self-pay | Admitting: Internal Medicine

## 2016-09-26 ENCOUNTER — Ambulatory Visit (INDEPENDENT_AMBULATORY_CARE_PROVIDER_SITE_OTHER): Payer: Medicare Other | Admitting: Internal Medicine

## 2016-09-26 ENCOUNTER — Encounter: Payer: Self-pay | Admitting: Internal Medicine

## 2016-09-26 VITALS — BP 120/64 | HR 74 | Ht 65.0 in | Wt 293.4 lb

## 2016-09-26 DIAGNOSIS — G4733 Obstructive sleep apnea (adult) (pediatric): Secondary | ICD-10-CM | POA: Diagnosis not present

## 2016-09-26 NOTE — Progress Notes (Signed)
HPI  female never smoker followed for OSA, complicated by ASA, morbid obesity, HBP, DOE, hypothyroid NPSG 06/30/16- Severe OSA-AHI 46.4/hour, CPAP titration to 13 CWP, desaturation to 87%, body weight 280 pounds Office Spirometry 04/23/2811-mild restriction-FVC 2.22/75%, FEV1 1.85/83%, ratio 0.83. -----------------------------------------------------------------------------  04/22/2016-[age] year old female never smoker referred courtesy of Dr. Jens Somrenshaw with question of OSA. Medical problems include moderate AS, morbid obesity, HBP, dyspnea on exertion, hypothyroid Widowed for 15 years but husband and sister both had told her she snores loudly. Sleeping alone now. Bothersome daytime sleepiness-never finishes a TV show but okay driving. No sleeping pills. 2 cups of coffee daily. Concerned about dyspnea on exertion which has been present probably for months with no acute event. Not aware of any pulmonary history. Describes "night terrors" for at least much of her adult life-about 8 times she has thrown herself out of bed onto the floor. No sleepwalking. ENT surgery limited to tonsils. Office Spirometry 04/23/2811-mild restriction-FVC 2.22/75%, FEV1 1.85/83%, ratio 0.83.  07/25/16- [age] year old female never smoker followed for OSA,? Night terrors, complicated by ASA, morbid obesity, HBP, DOE, hypothyroid NPSG 06/30/16- Severe OSA-AHI 46.4/hour, CPAP titration to 13 CWP, desaturation to 87%, body weight 280 pounds RESULTS  OF THE SLEEP STUDY  patient wants to go over the sleep study  We reviewed her sleep study, discussed the basics and treatment choices. Plan-CPAP auto 5-20  09/26/16- [age] year old female never smoker followed for OSA, complicated by ASA, morbid obesity, HBP, DOE, hypothyroid CPAP auto 5-20/ [facility] FOLLOWS FOR: [facility]. Pt unable to tolerate CPAP machine-noise, face mask, and overall not sleeping. She says she just cannot get comfortable with CPAP-complains of "noise"  although she admits its very quiet. Complains that the 3 different styles of masks provided by her DME company have all made her nose or face uncomfortable one way or another. Admits she's so worked up by it now that she can't let go to sleep wearing it and doesn't want to wear it. States that her arthritis pains interfere with her sleep. She doesn't want to try a sleeping pill to see if we could get past this stage. She doesn't want to be referred to consider an oral appliance. Her son uses an oral appliance and she said she will talk with him about his. She is focused on wanting to lose weight as the solution, but wants to go somewhere where somebody else will make her lose weight. She doesn't feel able to make her life style changes herself.  ROS-see HPI   Negative unless "+" Constitutional:    weight loss, night sweats, fevers, chills, + fatigue, lassitude. HEENT:    headaches, difficulty swallowing, tooth/dental problems, sore throat,       sneezing, itching, ear ache, nasal congestion, post nasal drip, snoring CV:    chest pain, orthopnea, PND, swelling in lower extremities, anasarca,                                                           dizziness, palpitations Resp:   + shortness of breath with exertion or at rest.                productive cough,   non-productive cough, coughing up of blood.              change in color  of mucus.  wheezing.   Skin:    rash or lesions. GI:  No-   heartburn, indigestion, abdominal pain, nausea, vomiting, diarrhea,                 change in bowel habits, loss of appetite GU: dysuria, change in color of urine, no urgency or frequency.   flank pain. MS:   joint pain, stiffness, decreased range of motion, back pain. Neuro-     nothing unusual Psych:  change in mood or affect.  depression or anxiety.   memory loss.  OBJ- Physical Exam General- Alert, Oriented, Affect-appropriate, Distress- none acute, + obese Skin- rash-none, lesions- none, excoriation-  none Lymphadenopathy- none Head- atraumatic            Eyes- Gross vision intact, PERRLA, conjunctivae and secretions clear            Ears- Hearing, canals-normal            Nose- Clear, no-Septal dev, mucus, polyps, erosion, perforation             Throat- Mallampati IV , mucosa clear , drainage- none, tonsils- atrophic, + own teeth Neck- flexible , trachea midline, no stridor , thyroid nl, carotid no bruit Chest - symmetrical excursion , unlabored           Heart/CV- RRR ,  Murmur + 2/6 syst  Ao space , no gallop  , no rub, nl s1 s2                           - JVD- none , edema- none, stasis changes- none, varices- none           Lung- clear to P&A, wheeze- none, cough- none , dullness-none, rub- none           Chest wall-  Abd-  Br/ Gen/ Rectal- Not done, not indicated Extrem- cyanosis- none, clubbing, none, atrophy- none, strength- nl, + cane Neuro- grossly intact to observation

## 2016-09-26 NOTE — Assessment & Plan Note (Signed)
She correctly understands that significant weight loss would make a dramatic difference in her overall health, but doesn't feel she can do it on her own. Plan-referral connection for bariatric program.

## 2016-09-26 NOTE — Patient Instructions (Addendum)
Order- [facility]   Dc CPAP - patient unable to tolerate for now  Order- referral number to Bariatric Program for weight loss options  Please do talk with you son about the oral appliance he is using for sleep apnea. I would be happy to refer you to an expert who could explain this approach to you.   I would be happy to work with you again on your sleep apnea issue as needed.

## 2016-09-26 NOTE — Assessment & Plan Note (Signed)
She is not wearing CPAP and not prepared to keep working with it. She has rejected other options for now and wants to focus on weight loss. Plan-we will have her return her CPAP back in so she is not pain for which she is using. We can come back to active treatment for sleep apnea and she is ready.

## 2016-09-30 ENCOUNTER — Telehealth: Payer: Self-pay | Admitting: Internal Medicine

## 2016-09-30 NOTE — Telephone Encounter (Signed)
Called and spoke with pt and she stated that she would rather see Dr. Dalbert GarnetBeasley for weight loss options.  Pt wanted to call and make sure you were aware of this.  She will call them tomorrow. Nothing further is needed. Thanks

## 2016-11-07 ENCOUNTER — Ambulatory Visit: Payer: Medicare Other | Admitting: Internal Medicine

## 2017-03-11 NOTE — Progress Notes (Signed)
HPI: FU AS. Echo 11/17 showed normal LV function, moderate LVH, moderate AS with mean gradient 24 mmHg and mild RAE. Since last seen, she notes some dyspnea on exertion but no orthopnea, PND, pedal edema, exertional chest pain or syncope.  Current Outpatient Medications  Medication Sig Dispense Refill  . bisoprolol-hydrochlorothiazide (ZIAC) 5-6.25 MG per tablet Take 1 tablet by mouth daily.    . cholecalciferol (VITAMIN D) 1000 units tablet Take 2,000 Units by mouth daily.    Marland Kitchen. esomeprazole (NEXIUM) 20 MG capsule Take 20 mg by mouth daily at 12 noon.    . etodolac (LODINE) 400 MG tablet Take 400 mg by mouth 2 (two) times daily.    . fish oil-omega-3 fatty acids 1000 MG capsule Take 2 g by mouth daily.    . Glucosamine-Chondroit-Vit C-Mn (GLUCOSAMINE 1500 COMPLEX) CAPS Take by mouth.    . levothyroxine (SYNTHROID, LEVOTHROID) 75 MCG tablet Take 75 mcg by mouth daily.    . vitamin B-12 (CYANOCOBALAMIN) 500 MCG tablet Take 500 mcg by mouth daily.     No current facility-administered medications for this visit.      Past Medical History:  Diagnosis Date  . Aortic stenosis   . H/O hiatal hernia   . Heart murmur    asymptomatic  . Hematuria   . HOH (hard of hearing)    sightly, bilaterally  . Hyperlipidemia   . Hypertension   . Hypothyroidism   . Kidney stone on left side     Past Surgical History:  Procedure Laterality Date  . ABDOMINAL HYSTERECTOMY     lso  . CYSTOSCOPY W/ RETROGRADES  02/28/2012   Procedure: CYSTOSCOPY WITH RETROGRADE PYELOGRAM;  Surgeon: Sebastian Acheheodore Manny, MD;  [facility] [place];  Service: Urology;  Laterality: Right;  . CYSTOSCOPY WITH STENT PLACEMENT  02/28/2012   Procedure: CYSTOSCOPY WITH STENT PLACEMENT;  Surgeon: Sebastian Acheheodore Manny, MD;  [facility] [place];  Service: Urology;  Laterality: Left;  . CYSTOSCOPY/RETROGRADE/URETEROSCOPY/STONE EXTRACTION WITH BASKET  02/28/2012   Procedure:  CYSTOSCOPY/RETROGRADE/URETEROSCOPY/STONE EXTRACTION WITH BASKET;  Surgeon: Sebastian Acheheodore Manny, MD;  [facility] [place];  Service: Urology;  Laterality: Left;  . HOLMIUM LASER APPLICATION  02/28/2012   Procedure: HOLMIUM LASER APPLICATION;  Surgeon: Sebastian Acheheodore Manny, MD;  [facility] [place];  Service: Urology;  Laterality: Left;  . KNEE ARTHROSCOPY     right  . TONSILLECTOMY    . VEIN LIGATION      Social History   Socioeconomic History  . Marital status: Widowed    Spouse name: Not on file  . Number of children: 3  . Years of education: Not on file  . Highest education level: Not on file  Social Needs  . Financial resource strain: Not on file  . Food insecurity - worry: Not on file  . Food insecurity - inability: Not on file  . Transportation needs - medical: Not on file  . Transportation needs - non-medical: Not on file  Occupational History  . Not on file  Tobacco Use  . Smoking status: Never Smoker  . Smokeless tobacco: Never Used  Substance and Sexual Activity  . Alcohol use: Yes    Comment: Occasional  . Drug use: Not on file  . Sexual activity: Not on file  Other Topics Concern  . Not on file  Social History Narrative  . Not on file    Family History  Problem Relation Age of Onset  . Heart failure Father  ROS: no fevers or chills, productive cough, hemoptysis, dysphasia, odynophagia, melena, hematochezia, dysuria, hematuria, rash, seizure activity, orthopnea, PND, pedal edema, claudication. Remaining systems are negative.  Physical Exam: Well-developed obese in no acute distress.  Skin is warm and dry.  HEENT is normal.  Neck is supple.  Chest is clear to auscultation with normal expansion.  Cardiovascular exam is regular rate and rhythm. 2/6 systolic murmur left sternal border. S2 is not diminished. Abdominal exam nontender or distended. No masses palpated. Extremities show trace edema. neuro grossly intact  ECG- normal  sinus rhythm at a rate of 69. Incomplete right bundle branch block. Left ventricular hypertrophy. personally reviewed  A/P  1 aortic stenosis-plan follow-up echocardiogram to reassess. She understands she will likely require aortic valve replacement in the future.  2 hypertension-blood pressure is controlled. Continue present medications.  3 dyspnea-likely multifactorial including obesity hypoventilation syndrome, deconditioning and possible sleep apnea.  4 morbid obesity-we discussed the importance of weight loss.  [name], MD

## 2017-03-14 ENCOUNTER — Ambulatory Visit: Payer: Medicare Other | Admitting: Cardiology

## 2017-03-14 ENCOUNTER — Encounter: Payer: Self-pay | Admitting: Cardiology

## 2017-03-14 VITALS — BP 112/76 | HR 69 | Ht 65.0 in | Wt 289.0 lb

## 2017-03-14 DIAGNOSIS — I10 Essential (primary) hypertension: Secondary | ICD-10-CM

## 2017-03-14 DIAGNOSIS — I350 Nonrheumatic aortic (valve) stenosis: Secondary | ICD-10-CM | POA: Diagnosis not present

## 2017-03-14 DIAGNOSIS — R0600 Dyspnea, unspecified: Secondary | ICD-10-CM

## 2017-03-14 NOTE — Patient Instructions (Signed)

## 2017-03-26 ENCOUNTER — Ambulatory Visit (HOSPITAL_COMMUNITY): Payer: Medicare Other | Attending: Internal Medicine

## 2017-03-26 ENCOUNTER — Other Ambulatory Visit: Payer: Self-pay

## 2017-03-26 DIAGNOSIS — Z8249 Family history of ischemic heart disease and other diseases of the circulatory system: Secondary | ICD-10-CM | POA: Diagnosis not present

## 2017-03-26 DIAGNOSIS — I10 Essential (primary) hypertension: Secondary | ICD-10-CM | POA: Diagnosis not present

## 2017-03-26 DIAGNOSIS — E785 Hyperlipidemia, unspecified: Secondary | ICD-10-CM | POA: Insufficient documentation

## 2017-03-26 DIAGNOSIS — I350 Nonrheumatic aortic (valve) stenosis: Secondary | ICD-10-CM

## 2017-05-06 ENCOUNTER — Other Ambulatory Visit: Payer: Self-pay | Admitting: Family Medicine

## 2017-05-06 DIAGNOSIS — Z1231 Encounter for screening mammogram for malignant neoplasm of breast: Secondary | ICD-10-CM

## 2017-05-07 ENCOUNTER — Ambulatory Visit
Admission: RE | Admit: 2017-05-07 | Discharge: 2017-05-07 | Disposition: A | Discharge status: Home / Self Care | Payer: Medicare Other | Source: Ambulatory Visit | Attending: Family Medicine | Admitting: Family Medicine

## 2017-05-07 DIAGNOSIS — Z1231 Encounter for screening mammogram for malignant neoplasm of breast: Secondary | ICD-10-CM

## 2017-07-24 ENCOUNTER — Encounter: Payer: Self-pay | Admitting: Neurology

## 2017-07-29 NOTE — Progress Notes (Signed)
Cindy Richmond was seen today in the movement disorders clinic for neurologic consultation at the request of Mila PalmerWolters, Sharon, MD.  The consultation is for the evaluation of right hand tremor.  Tremor: Yes.   , started in leg on the right a year ago at rest.  That got better about 6 months ago but now notes tremor in the R hand.    How long has it been going on? 1 year.  Saw Dr. Charlett BlakeVoytek about it and states that she was told that it could be disc related.  Notes are reviewed from her visit with orthopedics dated January 01, 2017.  It appears that she was being seen for low back pain.  I do not see any comments about tremor.  At rest or with activation?  rest  Fam hx of tremor?  No.  Located where?  R hand and R leg  Affected by caffeine:  No. (cup in the AM)  Affected by alcohol: doesn't drink  Affected by stress:  No.  Affected by fatigue:  No.  Spills soup if on spoon:  No.  Spills glass of liquid if full:  No.  Affects ADL's (tying shoes, brushing teeth, etc):  No.  Tremor inducing meds:  No.  Other Specific Symptoms:  Voice: no change Sleep: had PSG - had sleep apnea but could not tolerate CPAP  Vivid Dreams:  Yes.    Acting out dreams:  Yes.  , very active at night.  Has fallen out of the bed last 4-5 months ago Wet Pillows: No. Postural symptoms:  Yes.    Falls?  Yes.   Bradykinesia symptoms: difficulty getting out of a chair (but attributes to arthritis); walking is troublesome because of back pain and "my legs feel tired" Loss of smell:  No. Loss of taste:  No. Urinary Incontinence:  No. but has urgency Difficulty Swallowing:  No. Handwriting, micrographia: Yes.   Depression:  No. Memory changes:  No. Hallucinations:  No.  visual distortions: "I don't know" N/V:  No. Lightheaded:  No.  Syncope: No. Diplopia:  No. Dyskinesia:  No.  Neuroimaging of the brain has not previously been performed.    PREVIOUS MEDICATIONS: none to date  ALLERGIES:   Allergies  Allergen  Reactions  . Penicillins Other (See Comments)    unknown    CURRENT MEDICATIONS:  Outpatient Encounter Medications as of 07/31/2017  Medication Sig  . bisoprolol-hydrochlorothiazide (ZIAC) 5-6.25 MG per tablet Take 1 tablet by mouth daily.  . cholecalciferol (VITAMIN D) 1000 units tablet Take 2,000 Units by mouth daily.  Marland Kitchen. esomeprazole (NEXIUM) 20 MG capsule Take 20 mg by mouth daily at 12 noon.  . etodolac (LODINE) 400 MG tablet Take 400 mg by mouth 2 (two) times daily.  . ferrous sulfate 325 (65 FE) MG EC tablet Take 325 mg by mouth daily.  . fish oil-omega-3 fatty acids 1000 MG capsule Take 2 g by mouth daily.  . Glucosamine-Chondroit-Vit C-Mn (GLUCOSAMINE 1500 COMPLEX) CAPS Take by mouth.  . levothyroxine (SYNTHROID, LEVOTHROID) 75 MCG tablet Take 75 mcg by mouth daily.  . vitamin B-12 (CYANOCOBALAMIN) 500 MCG tablet Take 500 mcg by mouth daily.   No facility-administered encounter medications on file as of 07/31/2017.     PAST MEDICAL HISTORY:   Past Medical History:  Diagnosis Date  . Aortic stenosis   . H/O hiatal hernia   . Heart murmur    asymptomatic  . Hematuria   . HOH (hard of hearing)  sightly, bilaterally  . Hyperlipidemia   . Hypertension   . Hypothyroidism   . Kidney stone on left side     PAST SURGICAL HISTORY:   Past Surgical History:  Procedure Laterality Date  . ABDOMINAL HYSTERECTOMY     lso  . CYSTOSCOPY W/ RETROGRADES  02/28/2012   Procedure: CYSTOSCOPY WITH RETROGRADE PYELOGRAM;  Surgeon: Sebastian Ache, MD;  Location: [facility];  Service: Urology;  Laterality: Right;  . CYSTOSCOPY WITH STENT PLACEMENT  02/28/2012   Procedure: CYSTOSCOPY WITH STENT PLACEMENT;  Surgeon: Sebastian Ache, MD;  Location: [facility];  Service: Urology;  Laterality: Left;  . CYSTOSCOPY/RETROGRADE/URETEROSCOPY/STONE EXTRACTION WITH BASKET  02/28/2012   Procedure: CYSTOSCOPY/RETROGRADE/URETEROSCOPY/STONE EXTRACTION WITH BASKET;  Surgeon: Sebastian Ache, MD;  Location: [facility];  Service: Urology;  Laterality: Left;  . HOLMIUM LASER APPLICATION  02/28/2012   Procedure: HOLMIUM LASER APPLICATION;  Surgeon: Sebastian Ache, MD;  Location: [facility];  Service: Urology;  Laterality: Left;  . KNEE ARTHROSCOPY     right  . TONSILLECTOMY    . VEIN LIGATION      SOCIAL HISTORY:   Social History   Socioeconomic History  . Marital status: Widowed    Spouse name: Not on file  . Number of children: 3  . Years of education: Not on file  . Highest education level: Not on file  Occupational History  . Not on file  Social Needs  . Financial resource strain: Not on file  . Food insecurity:    Worry: Not on file    Inability: Not on file  . Transportation needs:    Medical: Not on file    Non-medical: Not on file  Tobacco Use  . Smoking status: Never Smoker  . Smokeless tobacco: Never Used  Substance and Sexual Activity  . Alcohol use: Yes    Comment: Occasional  . Drug use: Never  . Sexual activity: Not on file  Lifestyle  . Physical activity:    Days per week: Not on file    Minutes per session: Not on file  . Stress: Not on file  Relationships  . Social connections:    Talks on phone: Not on file    Gets together: Not on file    Attends religious service: Not on file    Active member of club or organization: Not on file    Attends meetings of clubs or organizations: Not on file    Relationship status: Not on file  . Intimate partner violence:    Fear of current or ex partner: Not on file    Emotionally abused: Not on file    Physically abused: Not on file    Forced sexual activity: Not on file  Other Topics Concern  . Not on file  Social History Narrative  . Not on file    FAMILY HISTORY:   Family Status  Relation Name Status  . Mother  Deceased  . Father  Deceased    ROS:  A complete 10 system review of systems was obtained and was unremarkable apart from what is mentioned  above.  PHYSICAL EXAMINATION:    VITALS:   Vitals:   07/31/17 1323  BP: 100/70  Pulse: 68  SpO2: 93%  Weight: 285 lb (129.3 kg)  Height: 5\' 5"  (1.651 m)    GEN:  The patient appears stated age and is in NAD. HEENT:  Normocephalic, atraumatic.  The mucous membranes are moist. The superficial temporal  arteries are without ropiness or tenderness. CV:  RRR Lungs:  CTAB.  There is DOE. Neck/HEME:  There are no carotid bruits bilaterally.  Neurological examination:  Orientation: The patient is alert and oriented x3. Fund of knowledge is appropriate.  Recent and remote memory are intact.  Attention and concentration are normal.    Able to name objects and repeat phrases. Cranial nerves: There is good facial symmetry. Pupils are equal round and reactive to light bilaterally. Fundoscopic exam reveals clear margins bilaterally. Extraocular muscles are intact. The visual fields are full to confrontational testing. The speech is fluent and clear.  While she has no trouble with the gutteral sounds, she does have pseudobulbar speech.  Soft palate rises symmetrically and there is no tongue deviation. Hearing is intact to conversational tone. Sensation: Sensation is intact to light and pinprick throughout (facial, trunk, extremities). Vibration is intact at the bilateral big toe. There is no extinction with double simultaneous stimulation. There is no sensory dermatomal level identified. Motor: Strength is 5/5 in the bilateral upper and lower extremities.   Shoulder shrug is equal and symmetric.  There is no pronator drift. Deep tendon reflexes: Deep tendon reflexes are 2/4 at the bilateral biceps, triceps, brachioradialis, and absent at the bilateral patella and achilles. Plantar responses are downgoing bilaterally.  Movement examination: Tone: There is increased tone in the RUE.  She has normal tone in the LUE/bilateral lower extremities Abnormal movements: There is RUE and more rare LUE tremor.   There is RLE resting tremor.   Coordination:  There is no decremation with RAM's, with any form of RAMS, including alternating supination and pronation of the forearm, hand opening and closing, finger taps, heel taps and toe taps on the right.   Gait and Station: The patient has no difficulty arising out of a deep-seated chair without the use of the hands. The patient's stride length is slightly decreased, no shuffling, but has decreased arm swing bilaterally.    Labs: Received lab work from primary care physician.  Lab work is dated November 07, 2016.  White blood cells are 6.5, hemoglobin 15.5, hematocrit 47.1 and platelets 146.  Sodium was 144, potassium 4.4, chloride 105, CO2 29, BUN 17 and creatinine 0.87.  Glucose was 99.  AST 17, ALT 22, alkaline phosphatase 61.  TSH was 2.92.  ASSESSMENT/PLAN:  1.  Parkinsonism.  I suspect that this does represent idiopathic Parkinson's disease but I did tell her that, based on pseudobulbar speech, an atypical state cannot be ruled out.  Time will help [place] differentiate these clinically.  The patient has tremor, bradykinesia, rigidity and mild postural instability.  -We discussed the diagnosis as well as pathophysiology of the disease.  We discussed treatment options as well as prognostic indicators.  Patient education was provided.  -We discussed that it used to be thought that levodopa would increase risk of melanoma but now it is believed that Parkinsons itself likely increases risk of melanoma. she is to get regular skin checks.  -Greater than 50% of the 60 minute visit was spent in counseling answering questions and talking about what to expect now as well as in the future.  We talked about medication options as well as potential future surgical options.  We talked about safety in the home.  -We decided to add carbidopa/levodopa 25/100.  1/2 tab tid x 1 wk, then 1/2 in am & noon & 1 at night for a week, then 1/2 in am &1 at noon &night for a week,  then 1 po  tid.  Risks, benefits, side effects and alternative therapies were discussed.  The opportunity to ask questions was given and they were answered to the best of my ability.  The patient expressed understanding and willingness to follow the outlined treatment protocols.  -I will refer the patient to the Parkinson's program in [facility]  -We discussed community resources in the area including patient support groups and community exercise programs for PD and pt education was provided to the patient.  -MRI brain will be done  -invited to PD symposium  2. REM behavior disorder  -This is commonly associated with PD and the patient is experiencing this.  We discussed that this can be very serious and even harmful.  We talked about medications as well as physical barriers to put in the bed (particularly soft bed rails, pillow barriers).  We talked about moving the night stand so that it is not so close to the side of the bed.  We didn't want to add 2 different medications today but will continue to address/monitor.  3.   Follow up is anticipated in the next few months, sooner should new neurologic issues arise.     Cc:  Mila Palmer, MD

## 2017-07-31 ENCOUNTER — Ambulatory Visit: Payer: Medicare Other | Admitting: Neurology

## 2017-07-31 ENCOUNTER — Encounter: Payer: Self-pay | Admitting: Neurology

## 2017-07-31 VITALS — BP 100/70 | HR 68 | Ht 65.0 in | Wt 285.0 lb

## 2017-07-31 DIAGNOSIS — R490 Dysphonia: Secondary | ICD-10-CM

## 2017-07-31 DIAGNOSIS — J383 Other diseases of vocal cords: Secondary | ICD-10-CM

## 2017-07-31 DIAGNOSIS — G20A1 Parkinson's disease without dyskinesia, without mention of fluctuations: Secondary | ICD-10-CM | POA: Insufficient documentation

## 2017-07-31 DIAGNOSIS — R270 Ataxia, unspecified: Secondary | ICD-10-CM

## 2017-07-31 DIAGNOSIS — G20 Parkinson's disease: Secondary | ICD-10-CM | POA: Diagnosis not present

## 2017-07-31 DIAGNOSIS — G4752 REM sleep behavior disorder: Secondary | ICD-10-CM

## 2017-07-31 MED ORDER — CARBIDOPA-LEVODOPA 25-100 MG PO TABS: 1.0000 | ORAL_TABLET | Freq: Three times a day (TID) | ORAL | 1 refills | Status: DC

## 2017-07-31 NOTE — Addendum Note (Signed)
Addended bySilvio Pate: MCCRACKEN, [name] L on: 07/31/2017 03:16 PM   Modules accepted: Orders

## 2017-07-31 NOTE — Patient Instructions (Addendum)
1. Start Carbidopa Levodopa as follows:  Take 1/2 tablet three times daily, at least 30 minutes before meals, for one week  Then take 1/2 tablet in the morning, 1/2 tablet in the afternoon, 1 tablet in the evening, at least 30 minutes before meals, for one week  Then take 1/2 tablet in the morning, 1 tablet in the afternoon, 1 tablet in the evening, at least 30 minutes before meals, for one week  Then take 1 tablet three times daily, at least 30 minutes before meals   As a reminder, carbidopa/levodopa can be taken at the same time as a carbohydrate, but we like to have you take your pill either 30 minutes before a protein source or 1 hour after as protein can interfere with carbidopa/levodopa absorption.  Registration is OPEN!    Third Annual Parkinson's Education Symposium   To register: [email or web address]      Search:  [employer] DeckerParkinson's Symposium  Register EACH person attending individually Questions: Contact Link SnufferJessica Thomas, Alexander MtLCSW  [phone] or [email or web address]    Drumming for Adults with Parkinson's Thursdays 9:30-10:30 am May 2, May 9, & May 16 & Tuesdays 10:00-11:00 am June 4, June 18, & July 2 [employer], [address] To register: [phone] or email [email or web address]       Community Parkinson's Exercise Programs   Parkinson's Wellness Recovery Exercise Programs:   PWR! Moves PD Exercise Class:  This is a therapist-led exercise class for people with Parkinson's disease in the [place] community. It consists of a one-hour exercise class each week. Classes are offered in eight-week sessions, and the cost per session is $80. Class size is limited to a maximum of 20 participants. Participant criteria includes: Participant must be able to get up and down from the floor with minimal to no assistance, have had 0-1 falls in the past 6 months, and have completed physical or occupational therapy at [facility] within the past year.  To find out more about session dates, questions, or to register, please contact Lonia BloodAmy Marriott, Physical Therapist, or Modena Morrowenise Robertson, Physical Environmental health practitionerTherapist Assistant, at [facility] at [phone].  PWR! Circuit Class:  This is a therapist-led exercise class with intervals of circuit activities incorporating PWR! Moves into functional activities. It consists of one 45-minute exercise class per week. Classes are offered in eight-week sessions, and the cost per session is $120. Class size is limited to a maximum of eight participants to allow for hands-on instruction. Participant criteria: class is ideal for people with Parkinson's disease who have completed PWR! Moves Exercise Class or who are currently independently exercising and want to be challenged, must be able to walk independently with 0-1 falls in the past 6 months, able to get up and down from the floor independently, able to sit to stand independently, and able to jog 20 feet.   To find out more about session dates, questions, or to register, please contact Lonia BloodAmy Marriott, Physical Therapist, or Modena Morrowenise Robertson, Physical Environmental health practitionerTherapist Assistant, at [facility] at [phone].   [employer] Parkinson's Cycle:   Parkinson's Cycle Class at [facility] This is an ongoing class on Monday and Thursday mornings at 10:45 a.m. A healthcare provider referral is required to enroll. This class is FREE to participants, and you do not have to be a member of the [employer] to enroll. Contact Beth at [phone] or [email or web address]. Parkinson's Cycle Class at [facility] Ongoing Class Monday, Wednesday, and Friday mornings at 9:00 a.m. A  healthcare provider referral is required to enroll. This class is FREE to participants, and you do not have to be a member of the [facility] to enroll. Contact Marlee at [phone] or  [email or web address]. Parkinson's Cycle Class at [facility] Ongoing Class every Friday mornings at 12 p.m.  A healthcare provider referral is required to enroll. This class is FREE to participants, and you do not have to be a member of the [facility] to enroll. Contact [phone].  Parkinson's Cycle Class at [facility] Ongoing Class every Monday at 12pm.  A healthcare provider referral is required to enroll. This class is FREE to participants, and you do not have to be a member of the [facility] to enroll. Contact Raynelle Fanning at [phone] or  [email or web address].   [employer]:  [employer]  Classes are offered Mondays at 5:15 p.m. and Tuesdays and Thursdays at 12 p.m. at [employer]. For more information, contact [phone] or visit [email or web address] or [email or web address]. [employer] [place] Classes are offered Monday, Wednesday, and Friday from 9:30 a.m. - 11:00 a.m. For more information, contact [phone] or [phone] or email [email or web address] or visit [email or web address] or [email or web address]. [employer] (classes are offered at 2 locations) . [facility] [employer] in [place] (for more information, contact Thad Stovall at [phone] or email [email or web address] . Al Corpus at [facility] (class is open to the public -- for more information, contact Arn Medal at [phone] or email [email or web address]) Otho Bellows [place] Classes are held at [facility] in [place], [place]. For more information, call Dr. Ventura Bruns at [phone] or [email or web address].   Personal Training for Parkinson's:   ACT Offers certified personal training to customize a program to meet your exercise needs to address Parkinson's disease. For more information, contact [phone] or visit [email or web address].  Community Dance for  Parkinson's:   Community dance class for people with Parkinson's Disease Wednesdays at 9 a.m. The [employer] of [address], [place] [address] Please contact Arrie Senate [phone] for more information  Scholarships Available for Fitness Programs:  The [employer] for [employer] is a non-profit 501(C)3 organization run by volunteers, whose mission is to strive to empower those living with Parkinson's Disease (PD), Progressive Supra-Nuclear Palsy (PSP) and Multiple System Atrophy (MSA).  Through financial support, recipients benefit from individual and group programs. [phone] [email or web address]

## 2017-08-04 ENCOUNTER — Telehealth: Payer: Self-pay | Admitting: Psychology

## 2017-08-04 NOTE — Telephone Encounter (Signed)
Telephone call to patient to introduce myself and shared resources/information.  In addition, was calling to share information about the Parkinson's symposium as it sounds like she may be interested.  I left a message with my contact information for return call.

## 2017-08-05 ENCOUNTER — Encounter: Payer: Self-pay | Admitting: Neurology

## 2017-08-08 ENCOUNTER — Telehealth: Payer: Self-pay | Admitting: Neurology

## 2017-08-08 NOTE — Telephone Encounter (Signed)
Tell patient that I got report of her MRI brain from [facility].  Reported to show some T2 hyperintensities throughout the white matter.  This is likely from the patient's history of hyperlipidemia and hypertension.  It was otherwise unremarkable.  Cindy RubensteinJade, we will give patient results.  All

## 2017-08-08 NOTE — Telephone Encounter (Signed)
Tried to call patient with no answer and no vm.

## 2017-08-11 NOTE — Telephone Encounter (Signed)
Patient made aware of results.   Also asked about her low blood pressure in office and being on blood pressure medication. I advised her to keep a log of blood pressures at home and if consistently low, she may need to discuss d/c of medication with her PCP.

## 2017-09-02 ENCOUNTER — Ambulatory Visit: Payer: Medicare Other | Admitting: Physical Therapy

## 2017-09-29 ENCOUNTER — Encounter: Payer: Self-pay | Admitting: Physical Therapy

## 2017-09-29 ENCOUNTER — Ambulatory Visit: Payer: Medicare Other | Attending: Neurology | Admitting: Physical Therapy

## 2017-09-29 ENCOUNTER — Other Ambulatory Visit: Payer: Self-pay

## 2017-09-29 VITALS — HR 74

## 2017-09-29 DIAGNOSIS — M6281 Muscle weakness (generalized): Secondary | ICD-10-CM | POA: Diagnosis present

## 2017-09-29 DIAGNOSIS — R2689 Other abnormalities of gait and mobility: Secondary | ICD-10-CM

## 2017-09-29 DIAGNOSIS — R2681 Unsteadiness on feet: Secondary | ICD-10-CM | POA: Diagnosis present

## 2017-09-29 DIAGNOSIS — R29818 Other symptoms and signs involving the nervous system: Secondary | ICD-10-CM | POA: Diagnosis not present

## 2017-09-29 NOTE — Therapy (Addendum)
[facility] [address]  [address] [place], [place], [address] Phone: [phone]   Fax:  [phone]  Physical Therapy Evaluation  Patient Details  Name: Cindy Richmond MRN: [id number] Date of Birth: December 27, 1950 Referring Provider: Kerin Salen, DO   Encounter Date: 09/29/2017  PT End of Session - 09/29/17 1447    Visit Number  1    Number of Visits  12    PT Start Time  1441    PT Stop Time  1533    PT Time Calculation (min)  52 min    Equipment Utilized During Treatment  Gait belt    Activity Tolerance  Patient tolerated treatment well    Behavior During Therapy  [facility] for tasks assessed/performed       Past Medical History:  Diagnosis Date  . Aortic stenosis   . GERD (gastroesophageal reflux disease)   . H/O hiatal hernia   . Heart murmur    asymptomatic  . Hematuria   . HOH (hard of hearing)    sightly, bilaterally  . Hyperlipidemia   . Hypertension   . Hypothyroidism   . Kidney stone on left side     Past Surgical History:  Procedure Laterality Date  . ABDOMINAL HYSTERECTOMY     lso  . CYSTOSCOPY W/ RETROGRADES  02/28/2012   Procedure: CYSTOSCOPY WITH RETROGRADE PYELOGRAM;  Surgeon: Sebastian Ache, MD;  Location: [facility];  Service: Urology;  Laterality: Right;  . CYSTOSCOPY WITH STENT PLACEMENT  02/28/2012   Procedure: CYSTOSCOPY WITH STENT PLACEMENT;  Surgeon: Sebastian Ache, MD;  Location: [facility];  Service: Urology;  Laterality: Left;  . CYSTOSCOPY/RETROGRADE/URETEROSCOPY/STONE EXTRACTION WITH BASKET  02/28/2012   Procedure: CYSTOSCOPY/RETROGRADE/URETEROSCOPY/STONE EXTRACTION WITH BASKET;  Surgeon: Sebastian Ache, MD;  Location: [facility];  Service: Urology;  Laterality: Left;  . HOLMIUM LASER APPLICATION  02/28/2012   Procedure: HOLMIUM LASER APPLICATION;  Surgeon: Sebastian Ache, MD;  Location: [facility];  Service: Urology;  Laterality: Left;   . KNEE ARTHROSCOPY     right  . TONSILLECTOMY    . VEIN LIGATION      Vitals:   09/29/17 1525  Pulse: 74  SpO2: 96%     Subjective Assessment - 09/29/17 1446    Subjective  Pt states that she is seeking therapy because of her difficulty with walking and recent dx of Parkinson's in June. Pt states that she has two slipped vertebrae in her low back and has difficulty standing for > 5 minutes secondary to this. She states that any longer than that she feels that her legs will collapse. Pt states that she takes smaller steps and will perform other movements including turning when walking or going up stairs very slowly.     Pertinent History  Parkinson's dx,     Limitations  Standing;Walking;House hold activities    Currently in Pain?  No/denies         [facility] PT Assessment - 09/29/17 0001      Assessment   Medical Diagnosis  Parkinson's disease    Referring Provider  Lurena Joiner Tat, DO    Hand Dominance  Right    Next MD Visit  12/09/2017    Prior Therapy  Yes      Balance Screen   Has the patient fallen in the past 6 months  No    Has the patient had a decrease in activity level because of a fear of falling?  No    Is the patient reluctant to leave their home because of a fear of falling?   No      Home Environment   Living Environment  Private residence    Living Arrangements  Alone    Home Access  Level entry    Home Layout  Two level    Home Equipment  Cane - single point;Shower seaNyet - built in;Toilet riser      Prior Function   Level of Independence  Independent with basic ADLs    Vocation  Retired    Leisure  Go on cruises; Geophysicist/field seismologistilver sneakers 2x/week; chair yoga class 1x/week      Sensation   Light Touch  Appears Intact      Coordination   Gross Motor Movements are Fluid and Coordinated  Yes      ROM / Strength   AROM / PROM / Strength  Strength      AROM   AROM Assessment Site  --    Right/Left Hip  --    Right/Left Ankle  --      Strength   Strength  Assessment Site  Hip;Knee;Ankle    Right/Left Hip  Right;Left    Right Hip Flexion  4/5    Right Hip Extension  4-/5    Right Hip External Rotation   4/5    Right Hip Internal Rotation  4-/5    Right Hip ABduction  4+/5    Right Hip ADduction  4-/5    Left Hip Flexion  4/5    Left Hip Extension  4-/5    Left Hip External Rotation  4/5    Left Hip Internal Rotation  4-/5    Left Hip ABduction  4+/5    Left Hip ADduction  4-/5    Right/Left Knee  Right;Left    Right Knee Flexion  4+/5    Right Knee Extension  5/5    Left Knee Flexion  4+/5    Left Knee Extension  5/5    Right/Left Ankle  Right;Left    Right Ankle Dorsiflexion  4+/5    Left Ankle Dorsiflexion  4+/5      Ambulation/Gait   Ambulation/Gait  Yes    Ambulation/Gait Assistance  6: Modified independent (Device/Increase time)    Assistive device  Straight cane    Gait Pattern  Decreased arm swing - right;Decreased arm swing - left;Decreased step length - right;Decreased step length - left;Decreased stride length;Trendelenburg;Lateral trunk lean to right;Narrow base of support;Decreased trunk rotation;Trunk flexed    Gait velocity  2.06 with SPC; 1.89 w no AD    Stairs  Yes    Stairs Assistance  6: Modified independent (Device/Increase time);5: Supervision    Stairs Assistance Details (indicate cue type and reason)  Cueing for hand placement on railing and descending technique    Stair Management Technique  One rail Right;Step to pattern;Sideways;With cane    Number of Stairs  12    Height of Stairs  7      Standardized Balance Assessment   Standardized Balance Assessment  Berg Balance Test;Timed Up and Go Test;10 meter walk test    10 Meter Walk  15.91 sec w/ SPC; 17.28 sec w/o AD      Berg Balance Test   Sit to Stand  Able to stand without using hands and stabilize independently    Standing Unsupported  Able to stand safely 2 minutes    Sitting with Back Unsupported but Feet Supported on  Floor or Stool  Able to sit  safely and securely 2 minutes    Stand to Sit  Sits safely with minimal use of hands    Transfers  Able to transfer safely, minor use of hands    Standing Unsupported with Eyes Closed  Able to stand 10 seconds safely    Standing Ubsupported with Feet Together  Able to place feet together independently and stand 1 minute safely    From Standing, Reach Forward with Outstretched Arm  Can reach forward >5 cm safely (2")    From Standing Position, Pick up Object from Floor  Able to pick up shoe safely and easily    From Standing Position, Turn to Look Behind Over each Shoulder  Turn sideways only but maintains balance    Turn 360 Degrees  Able to turn 360 degrees safely but slowly    Standing Unsupported, Alternately Place Feet on Step/Stool  Able to stand independently and complete 8 steps >20 seconds    Standing Unsupported, One Foot in Front  Able to place foot tandem independently and hold 30 seconds    Standing on One Leg  Tries to lift leg/unable to hold 3 seconds but remains standing independently    Total Score  46    Berg comment:  46-51 moderate (>50%)      Timed Up and Go Test   Normal TUG (seconds)  12.71    Manual TUG (seconds)  12.97    Cognitive TUG (seconds)  15.18      Functional Gait  Assessment   Gait assessed   Yes    Gait Level Surface  Walks 20 ft, slow speed, abnormal gait pattern, evidence for imbalance or deviates 10-15 in outside of the 12 in walkway width. Requires more than 7 sec to ambulate 20 ft.    Change in Gait Speed  Able to change speed, demonstrates mild gait deviations, deviates 6-10 in outside of the 12 in walkway width, or no gait deviations, unable to achieve a major change in velocity, or uses a change in velocity, or uses an assistive device.    Gait with Horizontal Head Turns  Performs head turns with moderate changes in gait velocity, slows down, deviates 10-15 in outside 12 in walkway width but recovers, can continue to walk.    Gait with Vertical Head  Turns  Performs task with moderate change in gait velocity, slows down, deviates 10-15 in outside 12 in walkway width but recovers, can continue to walk.    Gait and Pivot Turn  Turns slowly, requires verbal cueing, or requires several small steps to catch balance following turn and stop    Step Over Obstacle  Is able to step over one shoe box (4.5 in total height) but must slow down and adjust steps to clear box safely. May require verbal cueing.    Gait with Narrow Base of Support  Ambulates less than 4 steps heel to toe or cannot perform without assistance.    Gait with Eyes Closed  Walks 20 ft, slow speed, abnormal gait pattern, evidence for imbalance, deviates 10-15 in outside 12 in walkway width. Requires more than 9 sec to ambulate 20 ft.    Ambulating Backwards  Walks 20 ft, slow speed, abnormal gait pattern, evidence for imbalance, deviates 10-15 in outside 12 in walkway width.    Steps  Two feet to a stair, must use rail.    Total Score  10    FGA comment:  < 19 =  high risk fall                Objective measurements completed on examination: See above findings.                PT Short Term Goals - 09/29/17 1830      PT SHORT TERM GOAL #1   Title  Pt will be independent with initial HEP    Time  --    Status  New    Target Date  10/13/17        PT Long Term Goals - 09/29/17 1832      PT LONG TERM GOAL #1   Title  Pt will be independent with advanced HEP    Status  New    Target Date  11/10/17      PT LONG TERM GOAL #2   Title  Pt will reduce cognitive TUG time to within 2 seconds of normal TUG time to decrease fall risk    Status  New    Target Date  11/10/17      PT LONG TERM GOAL #3   Title  Pt will increase gait speed to 2.62 ft/s to with LRAD improve safety with community amb    Status  New    Target Date  11/10/17      PT LONG TERM GOAL #4   Title  Pt will improve Berg score to 52/56 to demonstrate meaningful change in balance ability       Status  New    Target Date  11/10/17      PT LONG TERM GOAL #5   Title  Pt will improve FGA score to 19/30 to decrease fall risk    Status  New    Target Date  11/10/17      Additional Long Term Goals   Additional Long Term Goals  Yes      PT LONG TERM GOAL #6   Title  Pt will ascend/descend 1 flight of stairs with 1 UE assist and reciprocal gait pattern to improve functional mobility within the home    Status  New    Target Date  11/10/17             Plan - 09/29/17 1812    Clinical Impression Statement  Pt is a 67 y/o F who presents to OP PT with PD dx. She presents with decreased strength, impaired balance ability, gait abnormalities, and decreased ability to perform functional activities independently including climbing a flight of stairs with a normal gait pattern. Her prognosis is positively impacted by the fact that her dx was very recent, but is negatively impacted by the progressive nature of PD. She will benefit from physical therapy at this time to address these aforementioned impairments, and maintain or improve her functional mobility.     Clinical Presentation  Evolving    Clinical Presentation due to:  Progressive nature of PD; and other comorbidities including obesity and cardiac involvement    Clinical Decision Making  Moderate    Rehab Potential  Fair    Clinical Impairments Affecting Rehab Potential  PD dx, morbid obesity    PT Frequency  2x / week    PT Duration  6 weeks    PT Treatment/Interventions  ADLs/Self Care Home Management;Cryotherapy;Electrical Stimulation;DME Instruction;Gait training;Stair training;Functional mobility training;Therapeutic activities;Therapeutic exercise;Balance training;Neuromuscular re-education;Patient/family education;Manual techniques;Taping    Consulted and Agree with Plan of Care  Patient       Patient will benefit from  skilled therapeutic intervention in order to improve the following deficits and impairments:  Abnormal  gait, Decreased endurance, Decreased activity tolerance, Decreased strength, Decreased balance, Decreased mobility, Difficulty walking, Improper body mechanics, Postural dysfunction  Visit Diagnosis: Other symptoms and signs involving the nervous system  Unsteadiness on feet  Other abnormalities of gait and mobility  Muscle weakness (generalized)     Problem List Patient Active Problem List   Diagnosis Date Noted  . Parkinson's disease (HCC) 07/31/2017  . RBD (REM behavioral disorder) 07/31/2017  . Aortic stenosis, moderate 04/22/2016  . Obstructive sleep apnea 04/22/2016  . Dyspnea on exertion 04/22/2016  . Night terrors, adult 04/22/2016  . Obesity, morbid (HCC) 04/22/2016    Mikal Plane, SPT 09/29/2017, 7:23 PM  [facility] [address]  [address] [place], [place], [address] Phone: [phone]   Fax:  [phone]  Name: Cindy Richmond MRN: [id number] Date of Birth: 1950/06/08

## 2017-10-02 ENCOUNTER — Ambulatory Visit: Payer: Medicare Other | Admitting: Physical Therapy

## 2017-10-02 ENCOUNTER — Encounter: Payer: Self-pay | Admitting: Physical Therapy

## 2017-10-02 DIAGNOSIS — R29818 Other symptoms and signs involving the nervous system: Secondary | ICD-10-CM | POA: Diagnosis not present

## 2017-10-02 DIAGNOSIS — R2689 Other abnormalities of gait and mobility: Secondary | ICD-10-CM

## 2017-10-02 DIAGNOSIS — R2681 Unsteadiness on feet: Secondary | ICD-10-CM

## 2017-10-02 DIAGNOSIS — M6281 Muscle weakness (generalized): Secondary | ICD-10-CM

## 2017-10-02 NOTE — Therapy (Addendum)
[facility] [address]  [address] [place], [place], [address] Phone: [phone]   Fax:  [phone]  Physical Therapy Treatment  Patient Details  Name: Cindy Richmond MRN: [id number] Date of Birth: 04-Mar-1950 Referring Provider: Kerin Salen, DO   Encounter Date: 10/02/2017  PT End of Session - 10/02/17 1541    Visit Number  2    Number of Visits  12    PT Start Time  1537   Pt late due to returning home after forgetting to take medication   PT Stop Time  1611    PT Time Calculation (min)  34 min    Equipment Utilized During Treatment  Gait belt    Activity Tolerance  Patient tolerated treatment well    Behavior During Therapy  [facility] for tasks assessed/performed       Past Medical History:  Diagnosis Date  . Aortic stenosis   . GERD (gastroesophageal reflux disease)   . H/O hiatal hernia   . Heart murmur    asymptomatic  . Hematuria   . HOH (hard of hearing)    sightly, bilaterally  . Hyperlipidemia   . Hypertension   . Hypothyroidism   . Kidney stone on left side     Past Surgical History:  Procedure Laterality Date  . ABDOMINAL HYSTERECTOMY     lso  . CYSTOSCOPY W/ RETROGRADES  02/28/2012   Procedure: CYSTOSCOPY WITH RETROGRADE PYELOGRAM;  Surgeon: Sebastian Ache, MD;  Location: [facility];  Service: Urology;  Laterality: Right;  . CYSTOSCOPY WITH STENT PLACEMENT  02/28/2012   Procedure: CYSTOSCOPY WITH STENT PLACEMENT;  Surgeon: Sebastian Ache, MD;  Location[facility];  Service: Urology;  Laterality: Left;  . CYSTOSCOPY/RETROGRADE/URETEROSCOPY/STONE EXTRACTION WITH BASKET  02/28/2012   Procedure: CYSTOSCOPY/RETROGRADE/URETEROSCOPY/STONE EXTRACTION WITH BASKET;  Surgeon: Sebastian Ache, MD;  Location: [facility];  Service: Urology;  Laterality: Left;  . HOLMIUM LASER APPLICATION  02/28/2012   Procedure: HOLMIUM LASER APPLICATION;  Surgeon: Sebastian Ache, MD;  Location:  [facility];  Service: Urology;  Laterality: Left;  . KNEE ARTHROSCOPY     right  . TONSILLECTOMY    . VEIN LIGATION      There were no vitals filed for this visit.  Subjective Assessment - 10/02/17 1540    Subjective  Pt states that she is well today and was late secondary to forgetting to take her medication (Ldopa) and having to return home to get it.     Pertinent History  Parkinson's dx,     Limitations  Standing;Walking;House hold activities    Currently in Pain?  No/denies                       [facility] Adult PT Treatment/Exercise - 10/02/17 0001      Exercises   Exercises  Knee/Hip;Lumbar      Lumbar Exercises: Aerobic   Nustep  L3 x 6 min          Balance Exercises - 10/02/17 1553      OTAGO PROGRAM   Head Movements  Standing;5 reps    Neck Movements  Standing;5 reps    Back Extension  Standing;5 reps    Trunk Movements  Standing;5 reps    Ankle Movements  Sitting;10 reps    Knee Extensor  10 reps    Knee Flexor  10 reps    Hip ABductor  10 reps  Ankle Plantorflexors  20 reps, support    Ankle Dorsiflexors  20 reps, support    Knee Bends  10 reps, support    Backwards Walking  Support   4 x 10 ft reps at counter   Walking and Turning Around  Assistive device   2 reps; [facility] with CGA from therapist       PT Education - 10/02/17 1822    Education Details  [place] exercise instruction began today    Person(s) Educated  Patient       PT Short Term Goals - 10/02/17 1622      PT SHORT TERM GOAL #1   Title  Pt will be independent with initial HEP    Status  On-going        PT Long Term Goals - 10/02/17 1621      PT LONG TERM GOAL #1   Title  Pt will be independent with advanced HEP    Status  On-going      PT LONG TERM GOAL #2   Title  Pt will reduce cognitive TUG time to within 2 seconds of normal TUG time to decrease fall risk    Status  On-going      PT LONG TERM GOAL #3   Title  Pt will increase gait speed to  2.62 ft/s to with LRAD improve safety with community amb    Status  On-going      PT LONG TERM GOAL #4   Title  Pt will improve Berg score to 52/56 to demonstrate meaningful change in balance ability      Status  On-going      PT LONG TERM GOAL #5   Title  Pt will improve FGA score to 19/30 to decrease fall risk    Status  On-going      PT LONG TERM GOAL #6   Title  Pt will ascend/descend 1 flight of stairs with 1 UE assist and reciprocal gait pattern to improve functional mobility within the home    Status  On-going            Plan - 10/02/17 1803    Clinical Impression Statement  Konnie's visit today focused on instruction in [place] with emphasis on LE strengthening and initial of balance portion of program. Instruction will be continued next visit. Pt did well with exercises introduced today but did express some dizziness with figure-8 walking that subsided when pt looked ahead during exercise instead of down at the floor. She also requires intermittent rest breaks due to low back pain. Pt will continue to benefit from physical therapy to address impairments including gait abnormalities, decreased strength, and balance abilities and continue to progress toward functional goals.     Rehab Potential  Fair    Clinical Impairments Affecting Rehab Potential  PD dx, morbid obesity    PT Treatment/Interventions  ADLs/Self Care Home Management;Cryotherapy;Electrical Stimulation;DME Instruction;Gait training;Stair training;Functional mobility training;Therapeutic activities;Therapeutic exercise;Balance training;Neuromuscular re-education;Patient/family education;Manual techniques;Taping    Consulted and Agree with Plan of Care  Patient       Patient will benefit from skilled therapeutic intervention in order to improve the following deficits and impairments:  Abnormal gait, Decreased endurance, Decreased activity tolerance, Decreased strength, Decreased balance, Decreased  mobility, Difficulty walking, Improper body mechanics, Postural dysfunction  Visit Diagnosis: Other symptoms and signs involving the nervous system  Unsteadiness on feet  Other abnormalities of gait and mobility  Muscle weakness (generalized)     Problem List Patient  Active Problem List   Diagnosis Date Noted  . Parkinson's disease (HCC) 07/31/2017  . RBD (REM behavioral disorder) 07/31/2017  . Aortic stenosis, moderate 04/22/2016  . Obstructive sleep apnea 04/22/2016  . Dyspnea on exertion 04/22/2016  . Night terrors, adult 04/22/2016  . Obesity, morbid (HCC) 04/22/2016    Mikal Plane, SPT  10/02/2017, 6:36 PM  [facility] [address]  [address] [place], [place], [address] Phone: [phone]   Fax:  [phone]  Name: IKHLAS ALBO MRN: [id number] Date of Birth: March 13, 1950

## 2017-10-06 ENCOUNTER — Encounter: Payer: Medicare Other | Admitting: Physical Therapy

## 2017-10-09 ENCOUNTER — Ambulatory Visit: Payer: Medicare Other | Admitting: Physical Therapy

## 2017-10-09 ENCOUNTER — Encounter: Payer: Self-pay | Admitting: Physical Therapy

## 2017-10-09 DIAGNOSIS — R2689 Other abnormalities of gait and mobility: Secondary | ICD-10-CM

## 2017-10-09 DIAGNOSIS — R2681 Unsteadiness on feet: Secondary | ICD-10-CM

## 2017-10-09 DIAGNOSIS — M6281 Muscle weakness (generalized): Secondary | ICD-10-CM

## 2017-10-09 DIAGNOSIS — R29818 Other symptoms and signs involving the nervous system: Secondary | ICD-10-CM | POA: Diagnosis not present

## 2017-10-09 NOTE — Therapy (Addendum)
[facility] [address]  [address] [place], [place], [address] Phone: [phone]   Fax:  [phone]  Physical Therapy Treatment  Patient Details  Name: Cindy Richmond MRN: [id number] Date of Birth: 02/21/1951 Referring Provider: Kerin Salenebecca Tat, DO   Encounter Date: 10/09/2017  PT End of Session - 10/09/17 1410    Visit Number  3    Number of Visits  12    PT Start Time  1504    PT Stop Time  1545    PT Time Calculation (min)  41 min    Equipment Utilized During Treatment  Gait belt    Activity Tolerance  Patient tolerated treatment well    Behavior During Therapy  [facility] for tasks assessed/performed       Past Medical History:  Diagnosis Date  . Aortic stenosis   . GERD (gastroesophageal reflux disease)   . H/O hiatal hernia   . Heart murmur    asymptomatic  . Hematuria   . HOH (hard of hearing)    sightly, bilaterally  . Hyperlipidemia   . Hypertension   . Hypothyroidism   . Kidney stone on left side     Past Surgical History:  Procedure Laterality Date  . ABDOMINAL HYSTERECTOMY     lso  . CYSTOSCOPY W/ RETROGRADES  02/28/2012   Procedure: CYSTOSCOPY WITH RETROGRADE PYELOGRAM;  Surgeon: Sebastian Acheheodore Manny, MD;  [facility] [place];  Service: Urology;  Laterality: Right;  . CYSTOSCOPY WITH STENT PLACEMENT  02/28/2012   Procedure: CYSTOSCOPY WITH STENT PLACEMENT;  Surgeon: Sebastian Acheheodore Manny, MD;  [facility] [place];  Service: Urology;  Laterality: Left;  . CYSTOSCOPY/RETROGRADE/URETEROSCOPY/STONE EXTRACTION WITH BASKET  02/28/2012   Procedure: CYSTOSCOPY/RETROGRADE/URETEROSCOPY/STONE EXTRACTION WITH BASKET;  Surgeon: Sebastian Acheheodore Manny, MD;  [facility] [place];  Service: Urology;  Laterality: Left;  . HOLMIUM LASER APPLICATION  02/28/2012   Procedure: HOLMIUM LASER APPLICATION;  Surgeon: Sebastian Acheheodore Manny, MD;  [facility] [place];  Service: Urology;  Laterality: Left;   . KNEE ARTHROSCOPY     right  . TONSILLECTOMY    . VEIN LIGATION      There were no vitals filed for this visit.  Subjective Assessment - 10/09/17 1406    Subjective  Pt reports that she is well today with no new concerns. Pt states that she did not do the exercises while she was at the beach over the weekend, but did them when she returned on Tuesday.     Pertinent History  Parkinson's dx,     Limitations  Standing;Walking;House hold activities    Currently in Pain?  No/denies    Pain Score  7    with activity    Pain Location  Back    Pain Orientation  Medial                       [facility] Adult PT Treatment/Exercise - 10/09/17 0001      Lumbar Exercises: Aerobic   Nustep  L3 x 6 min         PWR [facility]) - 10/09/17 1436    PWR! exercises  Moves in sitting;Moves in standing    PWR! Up  --    PWR! Rock  --    [place] Up  Standing; 10 reps; VC for glenohumeral ER    PWR! Rock  Standing; 10 reps; 5 reps to each side    PWR! Up  Sitting; 10  reps; VC for external rotation     PWR! Rock  Sitting; 20 reps    PWR! Twist  Sitting; 20 reps; 10 each side    PWR! Step  Sitting; 10 reps; 5 each side       Balance Exercises - 10/09/17 1719      [place]AG[place] PROGRAM   Sideways Walking  Assistive device    Tandem Stance  10 seconds, support    Tandem Walk  Support    One Leg Stand  10 seconds, support    Heel Walking  Support    Toe Walk  Support   Stopped after 10 reps secondary to R knee pain (OA)   Heel Toe Walking Backward  --   Support   Sit to Stand  10 reps, no support        PT Education - 10/09/17 1727    Education Details  [employer] instruction today and began PWR! moves, as well as informed pt of rationale for PWR moves    Person(s) Educated  Patient    Methods  Explanation;Demonstration;Handout    Comprehension  Verbalized understanding;Returned demonstration       PT Short Term Goals - 10/09/17 1728      PT SHORT TERM GOAL #1   Title  Pt will  be independent with initial HEP    Status  Achieved        PT Long Term Goals - 10/02/17 1621      PT LONG TERM GOAL #1   Title  Pt will be independent with advanced HEP    Status  On-going      PT LONG TERM GOAL #2   Title  Pt will reduce cognitive TUG time to within 2 seconds of normal TUG time to decrease fall risk    Status  On-going      PT LONG TERM GOAL #3   Title  Pt will increase gait speed to 2.62 ft/s to with LRAD improve safety with community amb    Status  On-going      PT LONG TERM GOAL #4   Title  Pt will improve Berg score to 52/56 to demonstrate meaningful change in balance ability      Status  On-going      PT LONG TERM GOAL #5   Title  Pt will improve FGA score to 19/30 to decrease fall risk    Status  On-going      PT LONG TERM GOAL #6   Title  Pt will ascend/descend 1 flight of stairs with 1 UE assist and reciprocal gait pattern to improve functional mobility within the home    Status  On-going            Plan - 10/09/17 1725    Clinical Impression Statement  Finished instruction in [place] exercise program today with all exercises performed well during today's session. Began incorporating PWR! movements into today's session with some fatigue noted by patient during these activities. She requires rest breaks due to her back pain, and states today that pain can get up to a 7 with activity but immediately resolves with sitting. She continues to demonstrate decreased LE strength, gait abnormalities, and impairments in ability to perform activities of daily living, and therefore will continue to benefit from physical therapy to address these impairments and progress toward functional goals.     Rehab Potential  Fair    Clinical Impairments Affecting Rehab Potential  PD dx, morbid obesity  PT Treatment/Interventions  ADLs/Self Care Home Management;Cryotherapy;Electrical Stimulation;DME Instruction;Gait training;Stair training;Functional mobility  training;Therapeutic activities;Therapeutic exercise;Balance training;Neuromuscular re-education;Patient/family education;Manual techniques;Taping    Consulted and Agree with Plan of Care  Patient       Patient will benefit from skilled therapeutic intervention in order to improve the following deficits and impairments:  Abnormal gait, Decreased endurance, Decreased activity tolerance, Decreased strength, Decreased balance, Decreased mobility, Difficulty walking, Improper body mechanics, Postural dysfunction  Visit Diagnosis: Other symptoms and signs involving the nervous system  Unsteadiness on feet  Other abnormalities of gait and mobility  Muscle weakness (generalized)     Problem List Patient Active Problem List   Diagnosis Date Noted  . Parkinson's disease (HCC) 07/31/2017  . RBD (REM behavioral disorder) 07/31/2017  . Aortic stenosis, moderate 04/22/2016  . Obstructive sleep apnea 04/22/2016  . Dyspnea on exertion 04/22/2016  . Night terrors, adult 04/22/2016  . Obesity, morbid (HCC) 04/22/2016    Mikal PlaneJenna Basir Niven, SPT  10/09/2017, 6:34 PM  [facility] [place] [address]  [address] [place], [place], [address] Phone: [phone]   Fax:  [phone]  Name: Cindy Richmond MRN: [id number] Date of Birth: 09/06/1950

## 2017-10-13 ENCOUNTER — Ambulatory Visit: Payer: Medicare Other | Admitting: Physical Therapy

## 2017-10-13 ENCOUNTER — Encounter: Payer: Self-pay | Admitting: Physical Therapy

## 2017-10-13 DIAGNOSIS — R2681 Unsteadiness on feet: Secondary | ICD-10-CM

## 2017-10-13 DIAGNOSIS — M6281 Muscle weakness (generalized): Secondary | ICD-10-CM

## 2017-10-13 DIAGNOSIS — R2689 Other abnormalities of gait and mobility: Secondary | ICD-10-CM

## 2017-10-13 DIAGNOSIS — R29818 Other symptoms and signs involving the nervous system: Secondary | ICD-10-CM

## 2017-10-13 NOTE — Therapy (Signed)
[facility] [address]  [address] [place], [place], [address] Phone: [phone]   Fax:  [phone]  Physical Therapy Treatment  Patient Details  Name: Cindy Richmond MRN: [id number] Date of Birth: April 25, 1950 Referring Provider: Kerin Salen, DO   Encounter Date: 10/13/2017  PT End of Session - 10/13/17 1448    Visit Number  4    Number of Visits  12    Authorization Type  UHC Medicare    PT Start Time  1448    PT Stop Time  1527    PT Time Calculation (min)  39 min    Equipment Utilized During Treatment  Gait belt    Activity Tolerance  Patient tolerated treatment well    Behavior During Therapy  [facility] for tasks assessed/performed       Past Medical History:  Diagnosis Date  . Aortic stenosis   . GERD (gastroesophageal reflux disease)   . H/O hiatal hernia   . Heart murmur    asymptomatic  . Hematuria   . HOH (hard of hearing)    sightly, bilaterally  . Hyperlipidemia   . Hypertension   . Hypothyroidism   . Kidney stone on left side     Past Surgical History:  Procedure Laterality Date  . ABDOMINAL HYSTERECTOMY     lso  . CYSTOSCOPY W/ RETROGRADES  02/28/2012   Procedure: CYSTOSCOPY WITH RETROGRADE PYELOGRAM;  Surgeon: Sebastian Ache, MD;  [facility];  Service: Urology;  Laterality: Right;  . CYSTOSCOPY WITH STENT PLACEMENT  02/28/2012   Procedure: CYSTOSCOPY WITH STENT PLACEMENT;  Surgeon: Sebastian Ache, MD;  Location: [facility];  Service: Urology;  Laterality: Left;  . CYSTOSCOPY/RETROGRADE/URETEROSCOPY/STONE EXTRACTION WITH BASKET  02/28/2012   Procedure: CYSTOSCOPY/RETROGRADE/URETEROSCOPY/STONE EXTRACTION WITH BASKET;  Surgeon: Sebastian Ache, MD;  Location: [facility];  Service: Urology;  Laterality: Left;  . HOLMIUM LASER APPLICATION  02/28/2012   Procedure: HOLMIUM LASER APPLICATION;  Surgeon: Sebastian Ache, MD;  Location: [facility];   Service: Urology;  Laterality: Left;  . KNEE ARTHROSCOPY     right  . TONSILLECTOMY    . VEIN LIGATION      There were no vitals filed for this visit.  Subjective Assessment - 10/13/17 1451    Subjective  Pt reports long history of LBP with prolonged standing or walking but states this usually resolsves with rest - has been told she has 2 "slipped discs" and arthritis in her back.    Pertinent History  Parkinson's dx,     Limitations  Standing;Walking;House hold activities    Currently in Pain?  No/denies    Pain Score  0-No pain   at rest, up to 7-8/10   Pain Location  Back    Pain Orientation  Medial    Pain Descriptors / Indicators  Pressure   "feels like it will give way"   Pain Type  Chronic pain    Pain Frequency  Intermittent    Aggravating Factors   prolonged standing or walking    Pain Relieving Factors  sitting/resting    Effect of Pain on Daily Activities  limited walking/standing tolerance                       [facility] Adult PT Treatment/Exercise - 10/13/17 1448      Exercises   Exercises  Lumbar      Lumbar Exercises: Aerobic   Nustep  L4 x 6 min (B UE/LE to promote reciprocal movement patterns)      Lumbar Exercises: Supine   Ab Set  10 reps;5 seconds    Glut Set  10 reps;3 seconds    Glut Set Limitations  in hooklying + abd bracing    Bent Knee Raise  10 reps;2 seconds    Bent Knee Raise Limitations  brace marching    Bridge Limitations  unable to clear hips off mat table    Other Supine Lumbar Exercises  Hooklying abd bracing + alt hip ABD/ER with red TB x10        PWR ([facility]) - 10/13/17 1400    PWR! exercises  Moves in sitting;Moves in standing    PWR! Up  x10    PWR! Rock  x10    PWR! Twist  x10    PWR Step  x10    PWR! Up  x20    PWR! Rock  [employer]x20    PWR! Twist  x20    PWR! Step  x10          PT Education - 10/13/17 1527    Education Details  Seated PWR! Moves added to [employer]HEP    Person(s) Educated  Patient    Methods   Explanation;Demonstration;Handout    Comprehension  Verbalized understanding;Returned demonstration       PT Short Term Goals - 10/09/17 1728      PT SHORT TERM GOAL #1   Title  Pt will be independent with initial HEP    Status  Achieved        PT Long Term Goals - 10/02/17 1621      PT LONG TERM GOAL #1   Title  Pt will be independent with advanced HEP    Status  On-going      PT LONG TERM GOAL #2   Title  Pt will reduce cognitive TUG time to within 2 seconds of normal TUG time to decrease fall risk    Status  On-going      PT LONG TERM GOAL #3   Title  Pt will increase gait speed to 2.62 ft/s to with LRAD improve safety with community amb    Status  On-going      PT LONG TERM GOAL #4   Title  Pt will improve Berg score to 52/56 to demonstrate meaningful change in balance ability      Status  On-going      PT LONG TERM GOAL #5   Title  Pt will improve FGA score to 19/30 to decrease fall risk    Status  On-going      PT LONG TERM GOAL #6   Title  Pt will ascend/descend 1 flight of stairs with 1 UE assist and reciprocal gait pattern to improve functional mobility within the home    Status  On-going            Plan - 10/13/17 1500    Clinical Impression Statement  Everlene denies any issues/concerns with [facility] as current HEP, but continues to note limitation with standing/walking activities due to LBP from spinal stenosis, As such, introduced basic core/lumbopelvic strengthening to promote improved musculature support for low back. Reviewed seated PWR! Moves intriduced last session with good pt tolerance, therefore added to HEP. Completed instruction in standing PWR! Moves but deferred adding these to HEP until furhter review.    Rehab Potential  Fair    Clinical Impairments Affecting Rehab Potential  PD  dx, morbid obesity    PT Treatment/Interventions  ADLs/Self Care Home Management;Cryotherapy;Electrical Stimulation;DME Instruction;Gait  training;Stair training;Functional mobility training;Therapeutic activities;Therapeutic exercise;Balance training;Neuromuscular re-education;Patient/family education;Manual techniques;Taping    Consulted and Agree with Plan of Care  Patient       Patient will benefit from skilled therapeutic intervention in order to improve the following deficits and impairments:  Abnormal gait, Decreased endurance, Decreased activity tolerance, Decreased strength, Decreased balance, Decreased mobility, Difficulty walking, Improper body mechanics, Postural dysfunction  Visit Diagnosis: Other symptoms and signs involving the nervous system  Unsteadiness on feet  Other abnormalities of gait and mobility  Muscle weakness (generalized)     Problem List Patient Active Problem List   Diagnosis Date Noted  . Parkinson's disease (HCC) 07/31/2017  . RBD (REM behavioral disorder) 07/31/2017  . Aortic stenosis, moderate 04/22/2016  . Obstructive sleep apnea 04/22/2016  . Dyspnea on exertion 04/22/2016  . Night terrors, adult 04/22/2016  . Obesity, morbid (HCC) 04/22/2016    Marry GuanJoAnne M Evette Diclemente, PT, MPT 10/13/2017, 3:32 PM  [facility] [place] [address]  [address] [place], [place], [address] Phone: [phone]   Fax:  [phone]  Name: Murriel HopperMyla P Strohmeyer MRN: [id number] Date of Birth: 06/26/1950

## 2017-10-16 ENCOUNTER — Encounter: Payer: Self-pay | Admitting: Physical Therapy

## 2017-10-16 ENCOUNTER — Ambulatory Visit: Payer: Medicare Other | Admitting: Physical Therapy

## 2017-10-16 DIAGNOSIS — R2681 Unsteadiness on feet: Secondary | ICD-10-CM

## 2017-10-16 DIAGNOSIS — R29818 Other symptoms and signs involving the nervous system: Secondary | ICD-10-CM

## 2017-10-16 DIAGNOSIS — M6281 Muscle weakness (generalized): Secondary | ICD-10-CM

## 2017-10-16 DIAGNOSIS — R2689 Other abnormalities of gait and mobility: Secondary | ICD-10-CM

## 2017-10-16 NOTE — Therapy (Signed)
[facility] [address]  [address] [place], [place], [address] Phone: [phone]   Fax:  [phone]  Physical Therapy Treatment  Patient Details  Name: Cindy Richmond MRN: [id number] Date of Birth: Mar 03, 1950 Referring Provider: Kerin Salen, DO   Encounter Date: 10/16/2017  PT End of Session - 10/16/17 1447    Visit Number  5    Number of Visits  12    Date for PT Re-Evaluation  11/10/17    Authorization Type  UHC Medicare    PT Start Time  1447    PT Stop Time  1528    PT Time Calculation (min)  41 min    Activity Tolerance  Patient tolerated treatment well;Patient limited by fatigue    Behavior During Therapy  [facility] for tasks assessed/performed       Past Medical History:  Diagnosis Date  . Aortic stenosis   . GERD (gastroesophageal reflux disease)   . H/O hiatal hernia   . Heart murmur    asymptomatic  . Hematuria   . HOH (hard of hearing)    sightly, bilaterally  . Hyperlipidemia   . Hypertension   . Hypothyroidism   . Kidney stone on left side     Past Surgical History:  Procedure Laterality Date  . ABDOMINAL HYSTERECTOMY     lso  . CYSTOSCOPY W/ RETROGRADES  02/28/2012   Procedure: CYSTOSCOPY WITH RETROGRADE PYELOGRAM;  Surgeon: Sebastian Ache, MD;  Location: [facility];  Service: Urology;  Laterality: Right;  . CYSTOSCOPY WITH STENT PLACEMENT  02/28/2012   Procedure: CYSTOSCOPY WITH STENT PLACEMENT;  Surgeon: Sebastian Ache, MD;  [facility]: [facility];  Service: Urology;  Laterality: Left;  . CYSTOSCOPY/RETROGRADE/URETEROSCOPY/STONE EXTRACTION WITH BASKET  02/28/2012   Procedure: CYSTOSCOPY/RETROGRADE/URETEROSCOPY/STONE EXTRACTION WITH BASKET;  Surgeon: Sebastian Ache, MD;  Location: [facility];  Service: Urology;  Laterality: Left;  . HOLMIUM LASER APPLICATION  02/28/2012   Procedure: HOLMIUM LASER APPLICATION;  Surgeon: Sebastian Ache, MD;  Location[facility];  Service: Urology;  Laterality: Left;  . KNEE ARTHROSCOPY     right  . TONSILLECTOMY    . VEIN LIGATION      There were no vitals filed for this visit.  Subjective Assessment - 10/16/17 1454    Subjective  Pt reports she had difficulty finding a chair to complete the seated PWR! Moves, esp PWR! Step, as all of her chairs have padded seat coverings which don't allow her to pivot well.    Pertinent History  Parkinson's dx,     Limitations  Standing;Walking;House hold activities    Currently in Pain?  No/denies                       [facility] Adult PT Treatment/Exercise - 10/16/17 1447      Ambulation/Gait   Ambulation Distance (Feet)  100 Feet   x4   Assistive device  None;Straight cane    Gait Comments  No AD on 1st rep but difficulty coordinating arm swing, therefore B canes with opposite end of cane held by PT to facilitate reciprocal pattern, followed by B extended height canes (pt better able to perform reciprocal pattern but slower cadence), finishing up with SPC focusing on recpirocal arm swing on opp side.      Lumbar Exercises: Aerobic   Nustep  L4 x 6 min (B UE/LE to promote reciprocal movement patterns)  [facility]WR [facility]) - 10/16/17 1447    PWR! exercises  Moves in standing;Functional moves    PWR! Up  x20    PWR! Rock  [employer]x20    PWR! Twist  x20    PWR Step  x20    PWR! Step Through Forward/Back  PWR! Step forward & back alternating feet x10 each, combined fwd/back x10 with each leg            PT Short Term Goals - 10/09/17 1728      PT SHORT TERM GOAL #1   Title  Pt will be independent with initial HEP    Status  Achieved        PT Long Term Goals - 10/02/17 1621      PT LONG TERM GOAL #1   Title  Pt will be independent with advanced HEP    Status  On-going      PT LONG TERM GOAL #2   Title  Pt will reduce cognitive TUG time to within 2 seconds of normal TUG time to decrease fall risk    Status  On-going      PT LONG  TERM GOAL #3   Title  Pt will increase gait speed to 2.62 ft/s to with LRAD improve safety with community amb    Status  On-going      PT LONG TERM GOAL #4   Title  Pt will improve Berg score to 52/56 to demonstrate meaningful change in balance ability      Status  On-going      PT LONG TERM GOAL #5   Title  Pt will improve FGA score to 19/30 to decrease fall risk    Status  On-going      PT LONG TERM GOAL #6   Title  Pt will ascend/descend 1 flight of stairs with 1 UE assist and reciprocal gait pattern to improve functional mobility within the home    Status  On-going            Plan - 10/16/17 1452    Clinical Impression Statement  Cindy Richmond denying any questions regarding seated PWR! Moves at home, but did note difficulty with PWR! Step due to surface of chair not allowing for ability to pivot. Reviewed standing PWR! Moves with pt able to demonstrate good technique, but fatiguing quickly requiring seated rest breaks. Discussed remaining PWR! Moves positions with pt verbalizing concern with prone position due to unable to lay comfortably in prone and quadruped position due to inability to tolerate kneeling on knees, therefore willmost likely defer these positions. Remainder of visit focusing on PWR! stepping patterns to promote increased step length and improved foot clearance as well as increased reciprocal arm swing to improve gait stability and decrease risk for falls - pt able to coordinate reasonably well with isolated moves, but experienced difficulty incorporpating moves into walking, esp reciprocal arm swing.    Rehab Potential  Fair    Clinical Impairments Affecting Rehab Potential  PD dx, morbid obesity    PT Treatment/Interventions  ADLs/Self Care Home Management;Cryotherapy;Electrical Stimulation;DME Instruction;Gait training;Stair training;Functional mobility training;Therapeutic activities;Therapeutic exercise;Balance training;Neuromuscular re-education;Patient/family  education;Manual techniques;Taping    Consulted and Agree with Plan of Care  Patient       Patient will benefit from skilled therapeutic intervention in order to improve the following deficits and impairments:  Abnormal gait, Decreased endurance, Decreased activity tolerance, Decreased strength, Decreased balance, Decreased mobility, Difficulty walking, Improper body mechanics, Postural dysfunction  Visit Diagnosis: Other symptoms and signs  involving the nervous system  Unsteadiness on feet  Other abnormalities of gait and mobility  Muscle weakness (generalized)     Problem List Patient Active Problem List   Diagnosis Date Noted  . Parkinson's disease (HCC) 07/31/2017  . RBD (REM behavioral disorder) 07/31/2017  . Aortic stenosis, moderate 04/22/2016  . Obstructive sleep apnea 04/22/2016  . Dyspnea on exertion 04/22/2016  . Night terrors, adult 04/22/2016  . Obesity, morbid (HCC) 04/22/2016    Marry Guan, PT, MPT 10/16/2017, 3:47 PM  [facility] [address]  [address] [place], [place], [address] Phone: [phone]   Fax:  [phone]  Name: Cindy Richmond MRN: [id number] Date of Birth: 1950-03-29

## 2017-10-20 ENCOUNTER — Encounter: Payer: Self-pay | Admitting: Physical Therapy

## 2017-10-20 ENCOUNTER — Ambulatory Visit: Payer: Medicare Other | Admitting: Physical Therapy

## 2017-10-20 DIAGNOSIS — R29818 Other symptoms and signs involving the nervous system: Secondary | ICD-10-CM | POA: Diagnosis not present

## 2017-10-20 DIAGNOSIS — M6281 Muscle weakness (generalized): Secondary | ICD-10-CM

## 2017-10-20 DIAGNOSIS — R2681 Unsteadiness on feet: Secondary | ICD-10-CM

## 2017-10-20 DIAGNOSIS — R2689 Other abnormalities of gait and mobility: Secondary | ICD-10-CM

## 2017-10-20 NOTE — Therapy (Signed)
[facility] Outpatient Rehabilitation [facility] [address]  [address] [place], [place], [address] Phone: [phone]   Fax:  [phone]  Physical Therapy Treatment  Patient Details  Name: DAUN RENS MRN: [id number] Date of Birth: May 15, 1950 Referring Provider: Kerin Salen, DO   Encounter Date: 10/20/2017  PT End of Session - 10/20/17 1445    Visit Number  6    Number of Visits  12    Date for PT Re-Evaluation  11/10/17    Authorization Type  [employer] Medicare    PT Start Time  1445    PT Stop Time  1524    PT Time Calculation (min)  39 min    Activity Tolerance  Patient tolerated treatment well;Patient limited by fatigue    Behavior During Therapy  [facility] for tasks assessed/performed       Past Medical History:  Diagnosis Date  . Aortic stenosis   . GERD (gastroesophageal reflux disease)   . H/O hiatal hernia   . Heart murmur    asymptomatic  . Hematuria   . HOH (hard of hearing)    sightly, bilaterally  . Hyperlipidemia   . Hypertension   . Hypothyroidism   . Kidney stone on left side     Past Surgical History:  Procedure Laterality Date  . ABDOMINAL HYSTERECTOMY     lso  . CYSTOSCOPY W/ RETROGRADES  02/28/2012   Procedure: CYSTOSCOPY WITH RETROGRADE PYELOGRAM;  Surgeon: Sebastian Ache, MD;  Location: [facility];  Service: Urology;  Laterality: Right;  . CYSTOSCOPY WITH STENT PLACEMENT  02/28/2012   Procedure: CYSTOSCOPY WITH STENT PLACEMENT;  Surgeon: Sebastian Ache, MD;  Location: [facility];  Service: Urology;  Laterality: Left;  . CYSTOSCOPY/RETROGRADE/URETEROSCOPY/STONE EXTRACTION WITH BASKET  02/28/2012   Procedure: CYSTOSCOPY/RETROGRADE/URETEROSCOPY/STONE EXTRACTION WITH BASKET;  Surgeon: Sebastian Ache, MD;  Location: [facility];  Service: Urology;  Laterality: Left;  . HOLMIUM LASER APPLICATION  02/28/2012   Procedure: HOLMIUM LASER APPLICATION;  Surgeon: Sebastian Ache, MD;  Location: [facility];  Service: Urology;  Laterality: Left;  . KNEE ARTHROSCOPY     right  . TONSILLECTOMY    . VEIN LIGATION      There were no vitals filed for this visit.  Subjective Assessment - 10/20/17 1447    Subjective  Estee noting some increased discomfort in her shoulders last night, not sure if it is from the PWR! Moves or something else.    Pertinent History  Parkinson's disease    Limitations  Standing;Walking;House hold activities    Currently in Pain?  Yes    Pain Score  4     Pain Location  Knee    Pain Orientation  Right    Pain Descriptors / Indicators  Aching   "inflammed"   Pain Type  Chronic pain                       [facility] Adult PT Treatment/Exercise - 10/20/17 1445      Exercises   Exercises  Lumbar      Lumbar Exercises: Aerobic   Nustep  L4 x 6 min (B UE/LE to promote reciprocal movement patterns)      Lumbar Exercises: Seated   Other Seated Lumbar Exercises  Abd bracing + B row & scap retraction/shoulder extension with red TB x10 each, R/L pallof press with double red TB x10 each      Lumbar Exercises: Supine  Bent Knee Raise  10 reps;2 seconds    Bent Knee Raise Limitations  brace marching    Dead Bug  10 reps;2 seconds    Dead Bug Limitations  from hooklying        [facility]WR [facility]) - 10/20/17 1445    PWR! exercises  Moves in standing;Moves in supine;Functional moves    PWR! Up  x10    PWR! Rock  x10    PWR! Twist  x10   knees bent   PWR! Step  x10    PWR! Up  x10    PWR! Rock  x10    PWR! Twist  x10    PWR Step  x10    PWR! Sit to Stand  x10            PT Short Term Goals - 10/09/17 1728      PT SHORT TERM GOAL #1   Title  Pt will be independent with initial HEP    Status  Achieved        PT Long Term Goals - 10/02/17 1621      PT LONG TERM GOAL #1   Title  Pt will be independent with advanced HEP    Status  On-going      PT LONG TERM GOAL #2   Title  Pt will reduce cognitive TUG time to within 2 seconds of  normal TUG time to decrease fall risk    Status  On-going      PT LONG TERM GOAL #3   Title  Pt will increase gait speed to 2.62 ft/s to with LRAD improve safety with community amb    Status  On-going      PT LONG TERM GOAL #4   Title  Pt will improve Berg score to 52/56 to demonstrate meaningful change in balance ability      Status  On-going      PT LONG TERM GOAL #5   Title  Pt will improve FGA score to 19/30 to decrease fall risk    Status  On-going      PT LONG TERM GOAL #6   Title  Pt will ascend/descend 1 flight of stairs with 1 UE assist and reciprocal gait pattern to improve functional mobility within the home    Status  On-going            Plan - 10/20/17 1451    Clinical Impression Statement  Lavilla reporting more fatigued today requiring more frequent rest breaks. As such reduced reps with standing PWR! Moves and focused more on seated and supine activities with frequent opportunities for brief rest breaks. Pt with increased difficulty with supine PWR! Moves due to obesity and feels like her bed will be too soft at home to attempt these, therefore shifting to more core and lumbopelvic strengthening activities while in supine with better tolerance.    Rehab Potential  Fair    Clinical Impairments Affecting Rehab Potential  PD dx, morbid obesity    PT Treatment/Interventions  ADLs/Self Care Home Management;Cryotherapy;Electrical Stimulation;DME Instruction;Gait training;Stair training;Functional mobility training;Therapeutic activities;Therapeutic exercise;Balance training;Neuromuscular re-education;Patient/family education;Manual techniques;Taping    Consulted and Agree with Plan of Care  Patient       Patient will benefit from skilled therapeutic intervention in order to improve the following deficits and impairments:  Abnormal gait, Decreased endurance, Decreased activity tolerance, Decreased strength, Decreased balance, Decreased mobility, Difficulty walking, Improper  body mechanics, Postural dysfunction  Visit Diagnosis: Other symptoms and signs involving the nervous  system  Unsteadiness on feet  Other abnormalities of gait and mobility  Muscle weakness (generalized)     Problem List Patient Active Problem List   Diagnosis Date Noted  . Parkinson's disease (HCC) 07/31/2017  . RBD (REM behavioral disorder) 07/31/2017  . Aortic stenosis, moderate 04/22/2016  . Obstructive sleep apnea 04/22/2016  . Dyspnea on exertion 04/22/2016  . Night terrors, adult 04/22/2016  . Obesity, morbid (HCC) 04/22/2016    Marry GuanJoAnne M Gio Janoski, PT, MPT 10/20/2017, 3:29 PM  [facility] [address]  [address] [place], [place], [address] Phone: [phone]   Fax:  [phone]  Name: Murriel HopperMyla P Grosvenor MRN: [id number] Date of Birth: 08/12/1950

## 2017-10-23 ENCOUNTER — Encounter: Payer: Self-pay | Admitting: Physical Therapy

## 2017-10-23 ENCOUNTER — Ambulatory Visit: Payer: Medicare Other | Admitting: Physical Therapy

## 2017-10-23 DIAGNOSIS — R29818 Other symptoms and signs involving the nervous system: Secondary | ICD-10-CM

## 2017-10-23 DIAGNOSIS — R2689 Other abnormalities of gait and mobility: Secondary | ICD-10-CM

## 2017-10-23 DIAGNOSIS — R2681 Unsteadiness on feet: Secondary | ICD-10-CM

## 2017-10-23 DIAGNOSIS — M6281 Muscle weakness (generalized): Secondary | ICD-10-CM

## 2017-10-23 NOTE — Therapy (Signed)
[facility] Outpatient Rehabilitation [facility] [address]  [address] [place], [place], [address] Phone: [phone]   Fax:  [phone]  Physical Therapy Treatment  Patient Details  Name: Cindy Richmond MRN: [id number] Date of Birth: 29-Jan-1951 Referring Provider: Kerin Salen, DO   Encounter Date: 10/23/2017  PT End of Session - 10/23/17 1450    Visit Number  7    Number of Visits  12    Date for PT Re-Evaluation  11/10/17    Authorization Type  [employer] Medicare    PT Start Time  1450    PT Stop Time  1528    PT Time Calculation (min)  38 min    Activity Tolerance  Patient tolerated treatment well;Patient limited by fatigue    Behavior During Therapy  [facility] for tasks assessed/performed       Past Medical History:  Diagnosis Date  . Aortic stenosis   . GERD (gastroesophageal reflux disease)   . H/O hiatal hernia   . Heart murmur    asymptomatic  . Hematuria   . HOH (hard of hearing)    sightly, bilaterally  . Hyperlipidemia   . Hypertension   . Hypothyroidism   . Kidney stone on left side     Past Surgical History:  Procedure Laterality Date  . ABDOMINAL HYSTERECTOMY     lso  . CYSTOSCOPY W/ RETROGRADES  02/28/2012   Procedure: CYSTOSCOPY WITH RETROGRADE PYELOGRAM;  Surgeon: Sebastian Ache, MD;  Location: [facility];  Service: Urology;  Laterality: Right;  . CYSTOSCOPY WITH STENT PLACEMENT  02/28/2012   Procedure: CYSTOSCOPY WITH STENT PLACEMENT;  Surgeon: Sebastian Ache, MD;  Location: [facility];  Service: Urology;  Laterality: Left;  . CYSTOSCOPY/RETROGRADE/URETEROSCOPY/STONE EXTRACTION WITH BASKET  02/28/2012   Procedure: CYSTOSCOPY/RETROGRADE/URETEROSCOPY/STONE EXTRACTION WITH BASKET;  Surgeon: Sebastian Ache, MD;  Location: [facility];  Service: Urology;  Laterality: Left;  . HOLMIUM LASER APPLICATION  02/28/2012   Procedure: HOLMIUM LASER APPLICATION;  Surgeon: Sebastian Ache, MD;  Location: [facility];  Service: Urology;  Laterality: Left;  . KNEE ARTHROSCOPY     right  . TONSILLECTOMY    . VEIN LIGATION      There were no vitals filed for this visit.  Subjective Assessment - 10/23/17 1450    Subjective  Tylesha reporting increased soreness in arms after some therapy sessions.    Pertinent History  Parkinson's disease    Limitations  Standing;Walking;House hold activities    Currently in Pain?  No/denies                       [facility] Adult PT Treatment/Exercise - 10/23/17 1450      Exercises   Exercises  Lumbar;Knee/Hip      Lumbar Exercises: Aerobic   Nustep  L4 x 6 min (LE only at pt request due c/o increased arm pain)      Knee/Hip Exercises: Standing   Hip Flexion  Left;Both;10 reps    Hip Flexion Limitations  alt toe clears to 8" step - 2# on ankles    Hip Extension  Right;Left;15 reps;Knee straight;Stengthening    Extension Limitations  2#    Forward Step Up  Right;Left;10 reps;Hand Hold: 1;Step Height: 6"    Forward Step Up Limitations  step-over, UE support on TM rail    Step Down  Right;Left;10 reps;Hand Hold: 2;Step Height: 4"    Step Down Limitations  lateral step down  with light touch on floor      Knee/Hip Exercises: Seated   Long Arc Quad  Right;Left;15 reps;Weights;Strengthening    Long Arc Quad Weight  2 lbs.    Marching  Both;10 reps;15 reps;Weights;Strengthening    Marching Limitations  2    Hamstring Curl  Right;Left;15 reps;Strengthening    Hamstring Limitations  red TB    Abduction/Adduction   Right;Left;15 reps;Strengthening   each direction   Abd/Adduction Limitations  red TB               PT Short Term Goals - 10/09/17 1728      PT SHORT TERM GOAL #1   Title  Pt will be independent with initial HEP    Status  Achieved        PT Long Term Goals - 10/02/17 1621      PT LONG TERM GOAL #1   Title  Pt will be independent with advanced HEP    Status  On-going      PT LONG TERM GOAL #2   Title  Pt  will reduce cognitive TUG time to within 2 seconds of normal TUG time to decrease fall risk    Status  On-going      PT LONG TERM GOAL #3   Title  Pt will increase gait speed to 2.62 ft/s to with LRAD improve safety with community amb    Status  On-going      PT LONG TERM GOAL #4   Title  Pt will improve Berg score to 52/56 to demonstrate meaningful change in balance ability      Status  On-going      PT LONG TERM GOAL #5   Title  Pt will improve FGA score to 19/30 to decrease fall risk    Status  On-going      PT LONG TERM GOAL #6   Title  Pt will ascend/descend 1 flight of stairs with 1 UE assist and reciprocal gait pattern to improve functional mobility within the home    Status  On-going            Plan - 10/23/17 1457    Clinical Impression Statement  Sammi reporting only one opportunity to attempt Standing PWR! Moves at home but denies any concerns at this time. Treatment session today focusing more on LE strengthening to improve functional mobility and increase activity tolerance - pt continues to require intermittent rest breaks due to fatigue.    Rehab Potential  Fair    Clinical Impairments Affecting Rehab Potential  PD dx, morbid obesity    PT Treatment/Interventions  ADLs/Self Care Home Management;Cryotherapy;Electrical Stimulation;DME Instruction;Gait training;Stair training;Functional mobility training;Therapeutic activities;Therapeutic exercise;Balance training;Neuromuscular re-education;Patient/family education;Manual techniques;Taping    Consulted and Agree with Plan of Care  Patient       Patient will benefit from skilled therapeutic intervention in order to improve the following deficits and impairments:  Abnormal gait, Decreased endurance, Decreased activity tolerance, Decreased strength, Decreased balance, Decreased mobility, Difficulty walking, Improper body mechanics, Postural dysfunction  Visit Diagnosis: Other symptoms and signs involving the nervous  system  Unsteadiness on feet  Other abnormalities of gait and mobility  Muscle weakness (generalized)     Problem List Patient Active Problem List   Diagnosis Date Noted  . Parkinson's disease (HCC) 07/31/2017  . RBD (REM behavioral disorder) 07/31/2017  . Aortic stenosis, moderate 04/22/2016  . Obstructive sleep apnea 04/22/2016  . Dyspnea on exertion 04/22/2016  . Night terrors, adult 04/22/2016  .  Obesity, morbid (HCC) 04/22/2016    Marry Guan, PT, MPT 10/23/2017, 6:05 PM  [facility] [address]  [address] [place], [place], [address] Phone: [phone]   Fax:  [phone]  Name: Cindy Richmond MRN: [id number] Date of Birth: [birth year]

## 2017-10-28 ENCOUNTER — Ambulatory Visit: Payer: Medicare Other | Admitting: Physical Therapy

## 2017-11-03 ENCOUNTER — Ambulatory Visit: Payer: Medicare Other | Attending: Neurology | Admitting: Physical Therapy

## 2017-11-03 ENCOUNTER — Encounter: Payer: Self-pay | Admitting: Physical Therapy

## 2017-11-03 DIAGNOSIS — M6281 Muscle weakness (generalized): Secondary | ICD-10-CM | POA: Diagnosis present

## 2017-11-03 DIAGNOSIS — R2689 Other abnormalities of gait and mobility: Secondary | ICD-10-CM | POA: Insufficient documentation

## 2017-11-03 DIAGNOSIS — R29818 Other symptoms and signs involving the nervous system: Secondary | ICD-10-CM

## 2017-11-03 DIAGNOSIS — R2681 Unsteadiness on feet: Secondary | ICD-10-CM | POA: Insufficient documentation

## 2017-11-03 NOTE — Therapy (Signed)
[facility] Outpatient Rehabilitation [facility] [address]  [address] [place], [place], [address] Phone: [phone]   Fax:  [phone]  Physical Therapy Treatment  Patient Details  Name: Cindy Richmond Richmond MRN: [id number] Date of Birth: 26-Oct-1950 Referring Provider: Kerin Salen, DO   Encounter Date: 11/03/2017  PT End of Session - 11/03/17 1445    Visit Number  8    Number of Visits  12    Date for PT Re-Evaluation  11/10/17    Authorization Type  [employer]HC Medicare    PT Start Time  1445    PT Stop Time  1534    PT Time Calculation (min)  49 min    Activity Tolerance  Patient tolerated treatment well;Patient limited by fatigue    Behavior During Therapy  [facility] for tasks assessed/performed       Past Medical History:  Diagnosis Date  . Aortic stenosis   . GERD (gastroesophageal reflux disease)   . H/O hiatal hernia   . Heart murmur    asymptomatic  . Hematuria   . HOH (hard of hearing)    sightly, bilaterally  . Hyperlipidemia   . Hypertension   . Hypothyroidism   . Kidney stone on left side     Past Surgical History:  Procedure Laterality Date  . ABDOMINAL HYSTERECTOMY     lso  . CYSTOSCOPY W/ RETROGRADES  02/28/2012   Procedure: CYSTOSCOPY WITH RETROGRADE PYELOGRAM;  Surgeon: Sebastian Ache, MD;  [facility];  Service: Urology;  Laterality: Right;  . CYSTOSCOPY WITH STENT PLACEMENT  02/28/2012   Procedure: CYSTOSCOPY WITH STENT PLACEMENT;  Surgeon: Sebastian Ache, MD;  Location[facility]  Service: Urology;  Laterality: Left;  . CYSTOSCOPY/RETROGRADE/URETEROSCOPY/STONE EXTRACTION WITH BASKET  02/28/2012   Procedure: CYSTOSCOPY/RETROGRADE/URETEROSCOPY/STONE EXTRACTION WITH BASKET;  Surgeon: Sebastian Ache, MD;  Location: [facility];  Service: Urology;  Laterality: Left;  . HOLMIUM LASER APPLICATION  02/28/2012   Procedure: HOLMIUM LASER APPLICATION;  Surgeon: Sebastian Ache, MD;  Location: [facility];  Service: Urology;  Laterality: Left;  . KNEE ARTHROSCOPY     right  . TONSILLECTOMY    . VEIN LIGATION      There were no vitals filed for this visit.  Subjective Assessment - 11/03/17 1448    Subjective  Pt left HEP folder at clinic on last visit, so has been trying to do her exercises from memory as best she can.    Pertinent History  Parkinson's disease    Limitations  Standing;Walking;House hold activities    Currently in Pain?  No/denies                       [facility] Adult PT Treatment/Exercise - 11/03/17 1445      Self-Care   Self-Care  Posture    Posture  Pt inquiring about strategies to improve standing tolerance in kitchen w/o limitation d/t LBP - Provided instruction in posture & body mechanics for typical daily tasks with particular emphasis on standing task.      Exercises   Exercises  Lumbar;Knee/Hip      Lumbar Exercises: Aerobic   Nustep  L5 x 6 min (B UE/LE to promote reciprocal movement patterns)        [facility]) - 11/03/17 1445    PWR! exercises  Moves in sitting;Moves in standing;Moves in supine    PWR! Twist  x5 with knees bent - promoting carryover into  rolling side-to-side in bed    PWR! Up  x10 - cues for proper LE alignment during squat portion of movement    PWR! Rock  x10 - cues for weight shift and toe push from trailing leg with lifting leg off floor    PWR! Twist  x10 - cues for full return to starting position before initiating rotation to opp side    PWR Step  x10 - cues for slight lateral turn during step with both arms extended in opp directions & eyes following stepping hand    Comments  Standing PWR! Moves reviewed with pt leading demonstration & PT providing cues for clarification & proper technique.    PWR! Up  x20 - cues to externally rotate arms to promote scap retraction & trunk extension    PWR! Rock  x20 - cues proper trunk lean with opp UE/LE extension and eyes following hands    PWR! Twist  x20 - cues  for full return to starting position before initiating rotation to opp side    PWR! Step  x20 - cues for increased hip flexion prior to lateral stepping with feet     Comments  Seated PWR! Moves reviewed with pt leading demonstration & PT providing cues for clarification & proper technique.          PT Education - 11/03/17 1518    Education Details  Posture & body mechanics education for typical daily tasks    Person(s) Educated  Patient    Methods  Explanation;Demonstration;Handout    Comprehension  Verbalized understanding;Returned demonstration       PT Short Term Goals - 10/09/17 1728      PT SHORT TERM GOAL #1   Title  Pt Cindy Richmond be independent with initial HEP    Status  Achieved        PT Long Term Goals - 10/02/17 1621      PT LONG TERM GOAL #1   Title  Pt Cindy Richmond be independent with advanced HEP    Status  On-going      PT LONG TERM GOAL #2   Title  Pt Cindy Richmond reduce cognitive TUG time to within 2 seconds of normal TUG time to decrease fall risk    Status  On-going      PT LONG TERM GOAL #3   Title  Pt Cindy Richmond increase gait speed to 2.62 ft/s to with LRAD improve safety with community amb    Status  On-going      PT LONG TERM GOAL #4   Title  Pt Cindy Richmond improve Berg score to 52/56 to demonstrate meaningful change in balance ability      Status  On-going      PT LONG TERM GOAL #5   Title  Pt Cindy Richmond improve FGA score to 19/30 to decrease fall risk    Status  On-going      PT LONG TERM GOAL #6   Title  Pt Cindy Richmond ascend/descend 1 flight of stairs with 1 UE assist and reciprocal gait pattern to improve functional mobility within the home    Status  On-going            Plan - 11/03/17 1449    Clinical Impression Statement  Cindy Richmond Richmond reporting limited opportunity to complete HEP over past 2 weeks d/t having left HEP folder at clinic and limited recall of some of exercises and PWR! Moves - reviewed PWR! Moves in sitting and standing with pt leading demonstration of movement  pattens and  PT providing cues for clarification and proper technique as needed.  Pt inquiring about strategies to improve standing tolerance in kitchen w/o limitation d/t LBP, therefore provided instruction in proper neutral spine posture & body mechanics for typical daily tasks with particular emphasis on standing tasks in kitchen and with household chores. Pt nearing end of Medicare authorized dates, therefore Cindy Richmond plan to assess progess toward goals as of next session to determine readiness to transtion to HEP vs need for recert.    Rehab Potential  Fair    Clinical Impairments Affecting Rehab Potential  PD dx, morbid obesity    PT Treatment/Interventions  ADLs/Self Care Home Management;Cryotherapy;Electrical Stimulation;DME Instruction;Gait training;Stair training;Functional mobility training;Therapeutic activities;Therapeutic exercise;Balance training;Neuromuscular re-education;Patient/family education;Manual techniques;Taping    Consulted and Agree with Plan of Care  Patient       Patient Cindy Richmond benefit from skilled therapeutic intervention in order to improve the following deficits and impairments:  Abnormal gait, Decreased endurance, Decreased activity tolerance, Decreased strength, Decreased balance, Decreased mobility, Difficulty walking, Improper body mechanics, Postural dysfunction  Visit Diagnosis: Other symptoms and signs involving the nervous system  Unsteadiness on feet  Other abnormalities of gait and mobility  Muscle weakness (generalized)     Problem List Patient Active Problem List   Diagnosis Date Noted  . Parkinson's disease (HCC) 07/31/2017  . RBD (REM behavioral disorder) 07/31/2017  . Aortic stenosis, moderate 04/22/2016  . Obstructive sleep apnea 04/22/2016  . Dyspnea on exertion 04/22/2016  . Night terrors, adult 04/22/2016  . Obesity, morbid (HCC) 04/22/2016    Marry Guan, PT, MPT 11/03/2017, 6:08 PM  [facility] [address]  [address] [place], [place], [address] Phone: [phone]   Fax:  [phone]  Name: Cindy Richmond Richmond MRN: [id number] Date of Birth: 17-Nov-1950

## 2017-11-03 NOTE — Patient Instructions (Signed)

## 2017-11-06 ENCOUNTER — Ambulatory Visit: Payer: Medicare Other | Admitting: Physical Therapy

## 2017-11-06 ENCOUNTER — Encounter: Payer: Self-pay | Admitting: Physical Therapy

## 2017-11-06 VITALS — BP 142/90 | HR 69

## 2017-11-06 DIAGNOSIS — R29818 Other symptoms and signs involving the nervous system: Secondary | ICD-10-CM

## 2017-11-06 DIAGNOSIS — R2689 Other abnormalities of gait and mobility: Secondary | ICD-10-CM

## 2017-11-06 DIAGNOSIS — R2681 Unsteadiness on feet: Secondary | ICD-10-CM

## 2017-11-06 DIAGNOSIS — M6281 Muscle weakness (generalized): Secondary | ICD-10-CM

## 2017-11-06 NOTE — Therapy (Addendum)
[facility] [address]  [place], [place], [address] Phone: [phone]   Fax:  [phone]  Physical Therapy Treatment  Patient Details  Name: Cindy Richmond MRN: [id number] Date of Birth: [birth year] Referring Provider: Alonza Bogus, DO   Encounter Date: 11/06/2017  PT End of Session - 11/06/17 1445    Visit Number  9    Number of Visits  12    Date for PT Re-Evaluation  11/10/17    Authorization Type  [employer] Medicare    PT Start Time  5009    PT Stop Time  1534    PT Time Calculation (min)  49 min    Activity Tolerance  Patient tolerated treatment well;Patient limited by fatigue    Behavior During Therapy  [facility] for tasks assessed/performed       Past Medical History:  Diagnosis Date  . Aortic stenosis   . GERD (gastroesophageal reflux disease)   . H/O hiatal hernia   . Heart murmur    asymptomatic  . Hematuria   . HOH (hard of hearing)    sightly, bilaterally  . Hyperlipidemia   . Hypertension   . Hypothyroidism   . Kidney stone on left side     Past Surgical History:  Procedure Laterality Date  . ABDOMINAL HYSTERECTOMY     lso  . CYSTOSCOPY W/ RETROGRADES  02/28/2012   Procedure: CYSTOSCOPY WITH RETROGRADE PYELOGRAM;  Surgeon: Alexis Frock, MD;  Location: [facility];  Service: Urology;  Laterality: Right;  . CYSTOSCOPY WITH STENT PLACEMENT  02/28/2012   Procedure: CYSTOSCOPY WITH STENT PLACEMENT;  Surgeon: Alexis Frock, MD;  Location: [facility];  Service: Urology;  Laterality: Left;  . CYSTOSCOPY/RETROGRADE/URETEROSCOPY/STONE EXTRACTION WITH BASKET  02/28/2012   Procedure: CYSTOSCOPY/RETROGRADE/URETEROSCOPY/STONE EXTRACTION WITH BASKET;  Surgeon: Alexis Frock, MD;  Location: [facility];  Service: Urology;  Laterality: Left;  . HOLMIUM LASER APPLICATION  05/03/1827   Procedure: HOLMIUM LASER APPLICATION;  Surgeon: Alexis Frock, MD;  Location: [facility];  Service: Urology;  Laterality: Left;  . KNEE ARTHROSCOPY     right  . TONSILLECTOMY    . VEIN LIGATION      Vitals:   11/06/17 1532  BP: (!) 142/90  Pulse: 69  SpO2: 91%    Subjective Assessment - 11/06/17 1446    Subjective  Pt noting increased stiffness in knees and ankles today, but not able to attribute this to anything in particular.    Pertinent History  Parkinson's disease    Limitations  Standing;Walking;House hold activities    Currently in Pain?  No/denies         [facility] PT Assessment - 11/06/17 1445      Assessment   Medical Diagnosis  Parkinson's disease    Referring Provider  Wells Guiles Tat, DO    Hand Dominance  Right    Next MD Visit  12/09/2017      Strength   Right Hip Flexion  4+/5    Right Hip Extension  4-/5    Right Hip External Rotation   4+/5    Right Hip Internal Rotation  4+/5    Right Hip ABduction  4+/5    Right Hip ADduction  4/5    Left Hip Flexion  4+/5    Left Hip Extension  4-/5    Left Hip External Rotation  4+/5    Left Hip Internal Rotation  4+/5    Left  Hip ABduction  4+/5    Left Hip ADduction  4/5    Right Knee Flexion  4+/5    Right Knee Extension  5/5    Left Knee Flexion  4+/5    Left Knee Extension  5/5    Right Ankle Dorsiflexion  5/5    Left Ankle Dorsiflexion  5/5      Ambulation/Gait   Ambulation/Gait Assistance  7: Independent;6: Modified independent (Device/Increase time)    Assistive device  None;Straight cane    Gait Pattern  Within Functional Limits    Ambulation Surface  Level;Indoor    Gait velocity  3.42 ft/sec with SPC; 3.32 ft/sec w/o AD    Stairs Assistance  6: Modified independent (Device/Increase time);5: Supervision    Stair Management Technique  One rail Right;Alternating pattern;Step to pattern;Forwards   step-to & reciprocal ascent, step-to descent   Number of Stairs  12    Height of Stairs  7      Standardized Balance Assessment   Standardized Balance Assessment  Berg Balance  Test;Timed Up and Go Test;Five Times Sit to Stand;10 meter walk test    Five times sit to stand comments   11.22 sec    10 Meter Walk  9.58 sec with SPC; 9.87 sec w/o AD      Berg Balance Test   Sit to Stand  Able to stand without using hands and stabilize independently    Standing Unsupported  Able to stand safely 2 minutes    Sitting with Back Unsupported but Feet Supported on Floor or Stool  Able to sit safely and securely 2 minutes    Stand to Sit  Sits safely with minimal use of hands    Transfers  Able to transfer safely, minor use of hands    Standing Unsupported with Eyes Closed  Able to stand 10 seconds safely    Standing Ubsupported with Feet Together  Able to place feet together independently and stand 1 minute safely    From Standing, Reach Forward with Outstretched Arm  Can reach confidently >25 cm (10")    From Standing Position, Pick up Object from Floor  Able to pick up shoe safely and easily    From Standing Position, Turn to Look Behind Over each Shoulder  Looks behind from both sides and weight shifts well    Turn 360 Degrees  Able to turn 360 degrees safely in 4 seconds or less    Standing Unsupported, Alternately Place Feet on Step/Stool  Able to stand independently and safely and complete 8 steps in 20 seconds    Standing Unsupported, One Foot in Front  Able to place foot tandem independently and hold 30 seconds    Standing on One Leg  Tries to lift leg/unable to hold 3 seconds but remains standing independently    Total Score  53    Berg comment:  52-55 lower fall risk (> 25%)      Timed Up and Go Test   Normal TUG (seconds)  9.34    Manual TUG (seconds)  11.19    Cognitive TUG (seconds)  11      Functional Gait  Assessment   Gait Level Surface  Walks 20 ft in less than 7 sec but greater than 5.5 sec, uses assistive device, slower speed, mild gait deviations, or deviates 6-10 in outside of the 12 in walkway width.    Change in Gait Speed  Able to smoothly change  walking speed without loss of balance  or gait deviation. Deviate no more than 6 in outside of the 12 in walkway width.    Gait with Horizontal Head Turns  Performs head turns smoothly with no change in gait. Deviates no more than 6 in outside 12 in walkway width    Gait with Vertical Head Turns  Performs task with slight change in gait velocity (eg, minor disruption to smooth gait path), deviates 6 - 10 in outside 12 in walkway width or uses assistive device    Gait and Pivot Turn  Pivot turns safely within 3 sec and stops quickly with no loss of balance.    Step Over Obstacle  Is able to step over one shoe box (4.5 in total height) without changing gait speed. No evidence of imbalance.    Gait with Narrow Base of Support  Ambulates 4-7 steps.    Gait with Eyes Closed  Walks 20 ft, slow speed, abnormal gait pattern, evidence for imbalance, deviates 10-15 in outside 12 in walkway width. Requires more than 9 sec to ambulate 20 ft.    Ambulating Backwards  Walks 20 ft, uses assistive device, slower speed, mild gait deviations, deviates 6-10 in outside 12 in walkway width.    Steps  Two feet to a stair, must use rail.    Total Score  20                   [facility] Adult PT Treatment/Exercise - 11/06/17 1445      Lumbar Exercises: Aerobic   Nustep  L5 x 6 min (B UE/LE to promote reciprocal movement patterns)               PT Short Term Goals - 10/09/17 1728      PT SHORT TERM GOAL #1   Title  Pt will be independent with initial HEP    Status  Achieved        PT Long Term Goals - 11/06/17 1450      PT LONG TERM GOAL #1   Title  Pt will be independent with advanced HEP    Status  Achieved      PT LONG TERM GOAL #2   Title  Pt will reduce cognitive TUG time to within 2 seconds of normal TUG time to decrease fall risk    Status  Achieved      PT LONG TERM GOAL #3   Title  Pt will increase gait speed to 2.62 ft/s to with LRAD improve safety with community amb    Status   Achieved      PT LONG TERM GOAL #4   Title  Pt will improve Berg score to 52/56 to demonstrate meaningful change in balance ability      Status  Achieved      PT LONG TERM GOAL #5   Title  Pt will improve FGA score to 19/30 to decrease fall risk    Status  Achieved      PT LONG TERM GOAL #6   Title  Pt will ascend/descend 1 flight of stairs with 1 UE assist and reciprocal gait pattern to improve functional mobility within the home    Status  Partially Met            Plan - 11/06/17 1448    Clinical Impression Statement  Cindy Richmond has demonstrated excellent progress with PT with significant improvement noted in gait speed and stability along with all standardized balance testing. All goals met with exception of reciprocal stair  descent with pt feeling more comfortable with step-to pattern. Pt feels confident with continuing on her own with HEP at this point therefore will proceed with discharge from PT for this epsiode and have pt schedule to return for 6 month Parkinson's disease screen..    Rehab Potential  Fair    Clinical Impairments Affecting Rehab Potential  PD dx, morbid obesity    PT Treatment/Interventions  ADLs/Self Care Home Management;Cryotherapy;Electrical Stimulation;DME Instruction;Gait training;Stair training;Functional mobility training;Therapeutic activities;Therapeutic exercise;Balance training;Neuromuscular re-education;Patient/family education;Manual techniques;Taping    PT Next Visit Plan  6 month Parkinson's screen    Consulted and Agree with Plan of Care  Patient       Patient will benefit from skilled therapeutic intervention in order to improve the following deficits and impairments:  Abnormal gait, Decreased endurance, Decreased activity tolerance, Decreased strength, Decreased balance, Decreased mobility, Difficulty walking, Improper body mechanics, Postural dysfunction  Visit Diagnosis: Other symptoms and signs involving the nervous system  Unsteadiness on  feet  Other abnormalities of gait and mobility  Muscle weakness (generalized)     Problem List Patient Active Problem List   Diagnosis Date Noted  . Parkinson's disease ([place]) 07/31/2017  . RBD (REM behavioral disorder) 07/31/2017  . Aortic stenosis, moderate 04/22/2016  . Obstructive sleep apnea 04/22/2016  . Dyspnea on exertion 04/22/2016  . Night terrors, adult 04/22/2016  . Obesity, morbid ([place]) 04/22/2016   PHYSICAL THERAPY DISCHARGE SUMMARY  Visits from Start of Care: 9  Current functional level related to goals / functional outcomes:   Refer to above clinical impression.   Remaining deficits:   As above.   Education / Equipment:   [place]! Moves  Plan: Patient agrees to discharge.  Patient goals were met. Patient is being discharged due to meeting the stated rehab goals.  ?????      Percival Spanish, PT, MPT 11/06/2017, 6:00 PM  [facility] [address]  [place], [place], [address] Phone: [phone]   Fax:  [phone]  Name: Cindy Richmond MRN: [id number] Date of Birth: July 27, 1950

## 2017-12-05 NOTE — Progress Notes (Signed)
Cindy Richmond was seen today in the movement disorders clinic for follow-up of parkinsonism.  She was started on levodopa last visit (7am/4pm/10pm).  She reports today that has really helped.  She has tremor that comes and goes.  She has had no falls since our last visit.  No lightheadedness or near syncope. Going to silver sneakers 3 days per week In regards to REM behavior disorder, she reports that she has not been doing that at all.  she [facility]  This shows some T2 hyperintensities throughout the subcortical white matter, likely due to the patient's history of hypertension and hyperlipidemia.  She did have outpatient therapy since our last visit.  Those notes are reviewed.  Noting that she is spontaneously sweating.  PREVIOUS MEDICATIONS: none to date  ALLERGIES:   Allergies  Allergen Reactions  . Penicillins Other (See Comments)    unknown    CURRENT MEDICATIONS:  Outpatient Encounter Medications as of 12/09/2017  Medication Sig  . bisoprolol-hydrochlorothiazide (ZIAC) 5-6.25 MG per tablet Take 1 tablet by mouth daily.  . carbidopa-levodopa (SINEMET IR) 25-100 MG tablet Take 1 tablet by mouth 3 (three) times daily.  . cholecalciferol (VITAMIN D) 1000 units tablet Take 2,000 Units by mouth daily.  Marland Kitchen esomeprazole (NEXIUM) 20 MG capsule Take 20 mg by mouth daily at 12 noon.  . fish oil-omega-3 fatty acids 1000 MG capsule Take 2 g by mouth daily.  . Glucosamine-Chondroit-Vit C-Mn (GLUCOSAMINE 1500 COMPLEX) CAPS Take by mouth.  . levothyroxine (SYNTHROID, LEVOTHROID) 75 MCG tablet Take 75 mcg by mouth daily.  . vitamin B-12 (CYANOCOBALAMIN) 500 MCG tablet Take 500 mcg by mouth daily.  . [DISCONTINUED] etodolac (LODINE) 400 MG tablet Take 400 mg by mouth 2 (two) times daily.  . [DISCONTINUED] ferrous sulfate 325 (65 FE) MG EC tablet Take 325 mg by mouth daily.   No facility-administered encounter medications on file as of 12/09/2017.     PAST MEDICAL HISTORY:    Past Medical History:  Diagnosis Date  . Aortic stenosis   . GERD (gastroesophageal reflux disease)   . H/O hiatal hernia   . Heart murmur    asymptomatic  . Hematuria   . HOH (hard of hearing)    sightly, bilaterally  . Hyperlipidemia   . Hypertension   . Hypothyroidism   . Kidney stone on left side     PAST SURGICAL HISTORY:   Past Surgical History:  Procedure Laterality Date  . ABDOMINAL HYSTERECTOMY     lso  . CYSTOSCOPY W/ RETROGRADES  02/28/2012   Procedure: CYSTOSCOPY WITH RETROGRADE PYELOGRAM;  Surgeon: Sebastian Ache, MD;  Location: [facility];  Service: Urology;  Laterality: Right;  . CYSTOSCOPY WITH STENT PLACEMENT  02/28/2012   Procedure: CYSTOSCOPY WITH STENT PLACEMENT;  Surgeon: Sebastian Ache, MD;  Location: [facility];  Service: Urology;  Laterality: Left;  . CYSTOSCOPY/RETROGRADE/URETEROSCOPY/STONE EXTRACTION WITH BASKET  02/28/2012   Procedure: CYSTOSCOPY/RETROGRADE/URETEROSCOPY/STONE EXTRACTION WITH BASKET;  Surgeon: Sebastian Ache, MD;  Location: [facility];  Service: Urology;  Laterality: Left;  . HOLMIUM LASER APPLICATION  02/28/2012   Procedure: HOLMIUM LASER APPLICATION;  Surgeon: Sebastian Ache, MD;  Location: [facility];  Service: Urology;  Laterality: Left;  . KNEE ARTHROSCOPY     right  . TONSILLECTOMY    . VEIN LIGATION      SOCIAL HISTORY:   Social History   Socioeconomic History  . Marital status: Widowed  Spouse name: Not on file  . Number of children: 3  . Years of education: Not on file  . Highest education level: Not on file  Occupational History  . 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 Financial resource strain: Not on file  . Food insecurity:    Worry: Not on file    Inability: Not on file  . Transportation needs:    Medical: Not on file    Non-medical: Not on file  Tobacco Use  . Smoking status: Never Smoker  . Smokeless tobacco: Never Used    Substance and Sexual Activity  . Alcohol use: Never    Frequency: Never  . Drug use: Never  . Sexual activity: Not on file  Lifestyle  . Physical activity:    Days per week: Not on file    Minutes per session: Not on file  . Stress: Not on file  Relationships  . Social connections:    Talks on phone: Not on file    Gets together: Not on file    Attends religious service: Not on file    Active member of club or organization: Not on file    Attends meetings of clubs or organizations: Not on file    Relationship status: Not on file  . Intimate partner violence:    Fear of current or ex partner: Not on file    Emotionally abused: Not on file    Physically abused: Not on file    Forced sexual activity: Not on file  Other Topics Concern  . Not on file  Social History Narrative  . Not on file    FAMILY HISTORY:   Family Status  Relation Name Status  . Mother  Deceased  . Father  Deceased  . Sister  Alive  . Son x3 Alive    ROS: Review of Systems  Constitutional: Negative.   HENT: Negative.   Eyes: Negative.   Respiratory: Negative.   Cardiovascular: Negative.   Genitourinary: Positive for frequency and urgency.  Musculoskeletal: Positive for back pain.  Skin: Negative.     PHYSICAL EXAMINATION:    VITALS:   Vitals:   12/09/17 1430  BP: 138/88  Pulse: 66  SpO2: 96%  Weight: 283 lb (128.4 kg)  Height: 5\' 5"  (1.651 m)    GEN:  The patient appears stated age and is in NAD. HEENT:  Normocephalic, atraumatic.  The mucous membranes are moist. The superficial temporal arteries are without ropiness or tenderness. CV:  RRR Lungs:  CTAB Neck/HEME:  There are no carotid bruits bilaterally.  Neurological examination:  Orientation: The patient is alert and oriented x3. Cranial nerves: There is good facial symmetry. The speech is fluent and clear. Soft palate rises symmetrically and there is no tongue deviation. Hearing is intact to conversational tone. Sensation:  Sensation is intact to light touch throughout Motor: Strength is 5/5 in the bilateral upper and lower extremities.   Shoulder shrug is equal and symmetric.  There is no pronator drift.  Movement examination: Tone: There is increased tone in the right upper extremity (has not taken medication since 7 AM and seen in the afternoon). Abnormal movements: There is intermittent right upper extremity resting tremor Coordination:  There is no decremation with RAM's, with any form of RAMS, including alternating supination and pronation of the forearm, hand opening and closing, finger taps, heel taps and toe taps on the right.   Gait and Station: The patient has no  difficulty arising out of a deep-seated chair without the use of the hands. The patient's stride length is slightly decreased, no shuffling, but has decreased arm swing bilaterally.    Labs: Received lab work from primary care physician.  Lab work is dated November 07, 2016.  White blood cells are 6.5, hemoglobin 15.5, hematocrit 47.1 and platelets 146.  Sodium was 144, potassium 4.4, chloride 105, CO2 29, BUN 17 and creatinine 0.87.  Glucose was 99.  AST 17, ALT 22, alkaline phosphatase 61.  TSH was 2.92.  ASSESSMENT/PLAN:  1.  Parkinsonism.  I suspect that this does represent idiopathic Parkinson's disease but I did tell her that, based on pseudobulbar speech, an atypical state cannot be ruled out.  Time will help [place] differentiate these clinically.  The patient has tremor, bradykinesia, rigidity and mild postural instability.  -We discussed the diagnosis as well as pathophysiology of the disease.  We discussed treatment options as well as prognostic indicators.  Patient education was provided.  -We discussed that it used to be thought that levodopa would increase risk of melanoma but now it is believed that Parkinsons itself likely increases risk of melanoma. she is to get regular skin checks.  -Continue carbidopa/levodopa 25/100, but change  timing to 7am/11am/4pm  2. REM behavior disorder  -She has not noted significant behavioral issues in this regard since our last visit.  She we will continue to monitor and will let me now.  3. Urinary frequency  -Encouraged her to make a follow-up appointment at [facility] urology, where she has been seen in the past.  4.  Sweating  -This can be associated with Parkinson's disease.  Talked to her about clonidine.  She declined for now.  5.  Follow up is anticipated in the next 5 months, sooner should new neurologic issues arise.   Cc:  Mila Palmer, MD

## 2017-12-09 ENCOUNTER — Encounter: Payer: Self-pay | Admitting: Neurology

## 2017-12-09 ENCOUNTER — Ambulatory Visit: Payer: Medicare Other | Admitting: Neurology

## 2017-12-09 VITALS — BP 138/88 | HR 66 | Ht 65.0 in | Wt 283.0 lb

## 2017-12-09 DIAGNOSIS — G20 Parkinson's disease: Secondary | ICD-10-CM | POA: Diagnosis not present

## 2017-12-09 DIAGNOSIS — L749 Eccrine sweat disorder, unspecified: Secondary | ICD-10-CM

## 2017-12-09 DIAGNOSIS — R350 Frequency of micturition: Secondary | ICD-10-CM

## 2017-12-09 DIAGNOSIS — G4752 REM sleep behavior disorder: Secondary | ICD-10-CM

## 2017-12-09 MED ORDER — CARBIDOPA-LEVODOPA 25-100 MG PO TABS: 1.0000 | ORAL_TABLET | Freq: Three times a day (TID) | ORAL | 1 refills | Status: DC

## 2017-12-09 NOTE — Patient Instructions (Addendum)
Continue carbidopa/levodopa 25/100, but change timing to 7am/11am/4pm  Let me know if you want a referral to a urogynecologist for the bladder

## 2017-12-29 ENCOUNTER — Telehealth: Payer: Self-pay | Admitting: Neurology

## 2017-12-29 ENCOUNTER — Encounter: Payer: Self-pay | Admitting: Internal Medicine

## 2017-12-29 NOTE — Telephone Encounter (Signed)
Called back. They needed MR report from [facility] on mutual patient. MR brain faxed to [facility] at [phone] as requested with confirmation received.

## 2017-12-29 NOTE — Telephone Encounter (Signed)
Katherine from Dr. James Ivanoff office  lmom regarding this patient and not being able to see anything performed and no report? Please Call. Thanks

## 2018-01-13 ENCOUNTER — Telehealth: Payer: Self-pay | Admitting: Neurology

## 2018-01-13 NOTE — Telephone Encounter (Signed)
Lab work is received and is dated December 29, 2017.  White blood cells are 7.4, hemoglobin 15.7, hematocrit 47.4 and platelets 150.  Sodium is 139, potassium 4.5, chloride 102, CO2 28, BUN 14, creatinine 0.82, AST 14, ALT 11, alkaline phosphatase 66, TSH 2.46

## 2018-04-07 ENCOUNTER — Other Ambulatory Visit: Payer: Self-pay | Admitting: Family Medicine

## 2018-04-07 DIAGNOSIS — Z1231 Encounter for screening mammogram for malignant neoplasm of breast: Secondary | ICD-10-CM

## 2018-05-11 ENCOUNTER — Ambulatory Visit: Payer: Medicare Other | Admitting: Physical Therapy

## 2018-05-11 NOTE — Progress Notes (Signed)
Cindy Richmond was seen today in the movement disorders clinic for follow-up of parkinsonism.  She was started on levodopa last visit (7am/4pm/10pm).  She reports today that has really helped.  She has tremor that comes and goes.  She has had no falls since our last visit.  No lightheadedness or near syncope. Going to silver sneakers 3 days per week In regards to REM behavior disorder, she reports that she has not been doing that at all.  she had an MRI brain [facility].  This shows some T2 hyperintensities throughout the subcortical white matter, likely due to the patient's history of hypertension and hyperlipidemia.  She did have outpatient therapy since our last visit.  Those notes are reviewed.  Noting that she is spontaneously sweating.  05/12/18 update: Patient seen today in follow-up for parkinsonism.  She is on carbidopa/levodopa 25/100, 1 tablet 3 times per day(8am/1pm/6pm).  Last took med at 1pm today.  I did ask her to move those dosages closer together after our last visit.  She reports today that she is doing "about the same."  Very sleepy in the AM but is supposed to be on CPAP at night, but cannot tolerate it so next morning sleepiness is due to this.  She has had no falls.  No lightheadedness or near syncope.  Reports that she has no "energy" in her legs.  Was going to chair yoga and silver sneakers 2 days a week.    PREVIOUS MEDICATIONS: carbidopa/levodopa 25/100  ALLERGIES:   Allergies  Allergen Reactions  . Penicillins Other (See Comments)    unknown    CURRENT MEDICATIONS:  Outpatient Encounter Medications as of 05/12/2018  Medication Sig  . bisoprolol-hydrochlorothiazide (ZIAC) 5-6.25 MG per tablet Take 1 tablet by mouth daily.  . carbidopa-levodopa (SINEMET IR) 25-100 MG tablet Take 1 tablet by mouth 3 (three) times daily.  . cholecalciferol (VITAMIN D) 1000 units tablet Take 2,000 Units by mouth daily.  Marland Kitchen esomeprazole (NEXIUM) 20 MG capsule Take 20 mg by mouth daily  at 12 noon.  . Glucosamine-Chondroit-Vit C-Mn (GLUCOSAMINE 1500 COMPLEX) CAPS Take by mouth.  . levothyroxine (SYNTHROID, LEVOTHROID) 75 MCG tablet Take 75 mcg by mouth daily.  . vitamin B-12 (CYANOCOBALAMIN) 500 MCG tablet Take 500 mcg by mouth daily.  . [DISCONTINUED] fish oil-omega-3 fatty acids 1000 MG capsule Take 2 g by mouth daily.   No facility-administered encounter medications on file as of 05/12/2018.     PAST MEDICAL HISTORY:   Past Medical History:  Diagnosis Date  . Aortic stenosis   . GERD (gastroesophageal reflux disease)   . H/O hiatal hernia   . Heart murmur    asymptomatic  . Hematuria   . HOH (hard of hearing)    sightly, bilaterally  . Hyperlipidemia   . Hypertension   . Hypothyroidism   . Kidney stone on left side     PAST SURGICAL HISTORY:   Past Surgical History:  Procedure Laterality Date  . ABDOMINAL HYSTERECTOMY     lso  . CYSTOSCOPY W/ RETROGRADES  02/28/2012   Procedure: CYSTOSCOPY WITH RETROGRADE PYELOGRAM;  Surgeon: Sebastian Ache, MD;  Location: [facility];  Service: Urology;  Laterality: Right;  . CYSTOSCOPY WITH STENT PLACEMENT  02/28/2012   Procedure: CYSTOSCOPY WITH STENT PLACEMENT;  Surgeon: Sebastian Ache, MD;  Location: [facility];  Service: Urology;  Laterality: Left;  . CYSTOSCOPY/RETROGRADE/URETEROSCOPY/STONE EXTRACTION WITH BASKET  02/28/2012   Procedure: CYSTOSCOPY/RETROGRADE/URETEROSCOPY/STONE EXTRACTION WITH BASKET;  Surgeon: Normand Sloop  Berneice Heinrich, MD;  Location: [facility];  Service: Urology;  Laterality: Left;  . HOLMIUM LASER APPLICATION  02/28/2012   Procedure: HOLMIUM LASER APPLICATION;  Surgeon: Sebastian Ache, MD;  Location: [facility];  Service: Urology;  Laterality: Left;  . KNEE ARTHROSCOPY     right  . TONSILLECTOMY    . VEIN LIGATION      SOCIAL HISTORY:   Social History   Socioeconomic History  . Marital status: Widowed    Spouse name: Not on file  . Number of  children: 3  . Years of education: Not on file  . Highest education level: Not on file  Occupational History  . 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 Financial resource strain: Not on file  . Food insecurity:    Worry: Not on file    Inability: Not on file  . Transportation needs:    Medical: Not on file    Non-medical: Not on file  Tobacco Use  . Smoking status: Never Smoker  . Smokeless tobacco: Never Used  Substance and Sexual Activity  . Alcohol use: Never    Frequency: Never  . Drug use: Never  . Sexual activity: Not on file  Lifestyle  . Physical activity:    Days per week: Not on file    Minutes per session: Not on file  . Stress: Not on file  Relationships  . Social connections:    Talks on phone: Not on file    Gets together: Not on file    Attends religious service: Not on file    Active member of club or organization: Not on file    Attends meetings of clubs or organizations: Not on file    Relationship status: Not on file  . Intimate partner violence:    Fear of current or ex partner: Not on file    Emotionally abused: Not on file    Physically abused: Not on file    Forced sexual activity: Not on file  Other Topics Concern  . Not on file  Social History Narrative  . Not on file    FAMILY HISTORY:   Family Status  Relation Name Status  . Mother  Deceased  . Father  Deceased  . Sister  Alive  . Son x3 Alive    ROS: Review of Systems  Constitutional: Negative.   HENT: Negative.   Eyes: Negative.   Cardiovascular: Negative.   Gastrointestinal: Negative.   Genitourinary: Negative.   Skin: Negative.   Endo/Heme/Allergies: Negative.     PHYSICAL EXAMINATION:    VITALS:   Vitals:   05/12/18 1450  BP: 130/86  Pulse: 72  Temp: 97.6 F (36.4 C)  TempSrc: Oral  SpO2: 94%  Weight: 289 lb (131.1 kg)  Height: 5' 5.5" (1.664 m)    GEN:  The patient appears stated age and is in NAD. HEENT:  Normocephalic, atraumatic.   The mucous membranes are moist. The superficial temporal arteries are without ropiness or tenderness. CV:  RRR Lungs:  CTAB Neck/HEME:  There are no carotid bruits bilaterally.  Neurological examination:  Orientation: The patient is alert and oriented x3. Cranial nerves: There is good facial symmetry. The speech is fluent and clear but is pseudobulbar in quality. Soft palate rises symmetrically and there is no tongue deviation. Hearing is intact to conversational tone. Sensation: Sensation is intact to light touch throughout Motor: Strength is 5/5 in the bilateral upper and lower extremities.  Shoulder shrug is equal and symmetric.  There is no pronator drift.  Movement examination: Tone: There is at least moderate increased tone in the right upper extremity.  Tone is mildly increased in the left upper extremity. Abnormal movements: There is intermittent right upper extremity resting tremor Coordination:  There is mild decremation, with any form of RAMS, including alternating supination and pronation of the forearm, hand opening and closing, finger taps, heel taps and toe taps. Gait and Station: The patient has no difficulty arising out of a deep-seated chair without the use of the hands. The patient's stride length is slightly decreased, no shuffling, but has decreased arm swing bilaterally.    Labs: Received lab work from primary care physician.  Lab work is dated December 29, 2017.  White blood cells are 7.4, hemoglobin 15.7, hematocrit 47.4 and platelets 150.  Sodium was 139, potassium 4.5, chloride 102, CO2 28, BUN 14, creatinine 0.82, AST 14, ALT 11, alkaline phosphatase 66, TSH 2.46.  ASSESSMENT/PLAN:  1.  Parkinsonism.  I suspect that this does represent idiopathic Parkinson's disease but I did tell her that, based on pseudobulbar speech, an atypical state cannot be ruled out.  Time will help [place] differentiate these clinically.  The patient has tremor, bradykinesia, rigidity and mild  postural instability.  -We discussed the diagnosis as well as pathophysiology of the disease.  We discussed treatment options as well as prognostic indicators.  Patient education was provided.  -We discussed that it used to be thought that levodopa would increase risk of melanoma but now it is believed that Parkinsons itself likely increases risk of melanoma. she is to get regular skin checks.  -I was going to start pramipexole but after hearing r/b/se, she was very resistant and ultimately refused.    -Increase carbidopa/levodopa 25/1 100 to 1 tablet 4 times per day at 8 AM/noon/4 PM/8 PM.  2. REM behavior disorder  -She has not noted significant behavioral issues in this regard since our last visit.  She we will continue to monitor and will let me now.  3. Urinary frequency  -Encouraged her to make a follow-up appointment at [facility] urology, where she has been seen in the past.  4.  Sweating  -This can be associated with Parkinson's disease.  Talked to her about clonidine.  She declined for now.  5.  Follow up is anticipated in the next few months, sooner should new neurologic issues arise.  Much greater than 50% of this visit was spent in counseling and coordinating care.  Total face to face time:  25 min   Cc:  Mila Palmer, MD

## 2018-05-12 ENCOUNTER — Encounter: Payer: Self-pay | Admitting: Neurology

## 2018-05-12 ENCOUNTER — Ambulatory Visit: Payer: Medicare Other | Admitting: Neurology

## 2018-05-12 ENCOUNTER — Other Ambulatory Visit: Payer: Self-pay

## 2018-05-12 VITALS — BP 130/86 | HR 72 | Temp 97.6°F | Ht 65.5 in | Wt 289.0 lb

## 2018-05-12 DIAGNOSIS — G20 Parkinson's disease: Secondary | ICD-10-CM | POA: Diagnosis not present

## 2018-05-12 NOTE — Patient Instructions (Addendum)
Take carbidopa/levodopa 25/100 at 8am/noon/4pm/8pm.  Call me when you need a refill of the medication  The physicians and staff at [facility] are committed to providing excellent care. You may receive a survey requesting feedback about your experience at our office. We strive to receive "very good" responses to the survey questions. If you feel that your experience would prevent you from giving the office a "very good " response, please contact our office to try to remedy the situation. We may be reached at [phone]. Thank you for taking the time out of your busy day to complete the survey.

## 2018-05-15 ENCOUNTER — Ambulatory Visit: Payer: Medicare Other

## 2018-06-16 ENCOUNTER — Telehealth: Payer: Self-pay | Admitting: *Deleted

## 2018-06-16 NOTE — Telephone Encounter (Signed)
06/16/18 LMOM @ 10:08 am, RE: follow up appointment.

## 2018-07-03 ENCOUNTER — Other Ambulatory Visit: Payer: Self-pay | Admitting: Orthopaedic Surgery

## 2018-07-03 ENCOUNTER — Other Ambulatory Visit: Payer: Self-pay

## 2018-07-03 ENCOUNTER — Emergency Department (HOSPITAL_COMMUNITY)
Admission: EM | Admit: 2018-07-03 | Discharge: 2018-07-03 | Disposition: A | Discharge status: Home / Self Care | Payer: Medicare Other | Attending: Emergency Medicine | Admitting: Emergency Medicine

## 2018-07-03 ENCOUNTER — Emergency Department (HOSPITAL_COMMUNITY): Payer: Medicare Other

## 2018-07-03 ENCOUNTER — Encounter (HOSPITAL_COMMUNITY): Payer: Self-pay | Admitting: Emergency Medicine

## 2018-07-03 DIAGNOSIS — I10 Essential (primary) hypertension: Secondary | ICD-10-CM | POA: Insufficient documentation

## 2018-07-03 DIAGNOSIS — Z79899 Other long term (current) drug therapy: Secondary | ICD-10-CM | POA: Diagnosis not present

## 2018-07-03 DIAGNOSIS — W010XXA Fall on same level from slipping, tripping and stumbling without subsequent striking against object, initial encounter: Secondary | ICD-10-CM | POA: Diagnosis not present

## 2018-07-03 DIAGNOSIS — S0083XA Contusion of other part of head, initial encounter: Secondary | ICD-10-CM | POA: Insufficient documentation

## 2018-07-03 DIAGNOSIS — S42212A Unspecified displaced fracture of surgical neck of left humerus, initial encounter for closed fracture: Secondary | ICD-10-CM | POA: Diagnosis not present

## 2018-07-03 DIAGNOSIS — Y939 Activity, unspecified: Secondary | ICD-10-CM | POA: Insufficient documentation

## 2018-07-03 DIAGNOSIS — E039 Hypothyroidism, unspecified: Secondary | ICD-10-CM | POA: Diagnosis not present

## 2018-07-03 DIAGNOSIS — M25512 Pain in left shoulder: Secondary | ICD-10-CM

## 2018-07-03 DIAGNOSIS — G20 Parkinson's disease: Secondary | ICD-10-CM | POA: Insufficient documentation

## 2018-07-03 DIAGNOSIS — Y92002 Bathroom of unspecified non-institutional (private) residence single-family (private) house as the place of occurrence of the external cause: Secondary | ICD-10-CM | POA: Insufficient documentation

## 2018-07-03 DIAGNOSIS — Y999 Unspecified external cause status: Secondary | ICD-10-CM | POA: Insufficient documentation

## 2018-07-03 DIAGNOSIS — S4992XA Unspecified injury of left shoulder and upper arm, initial encounter: Secondary | ICD-10-CM | POA: Diagnosis present

## 2018-07-03 DIAGNOSIS — W19XXXA Unspecified fall, initial encounter: Secondary | ICD-10-CM

## 2018-07-03 MED ORDER — HYDROCODONE-ACETAMINOPHEN 5-325 MG PO TABS: 1.0000 | ORAL_TABLET | Freq: Three times a day (TID) | ORAL | 0 refills | Status: AC | PRN

## 2018-07-03 MED ORDER — ACETAMINOPHEN 500 MG PO TABS: 1000.0000 mg | ORAL_TABLET | Freq: Three times a day (TID) | ORAL | 0 refills | Status: AC

## 2018-07-03 MED ORDER — FENTANYL CITRATE (PF) 100 MCG/2ML IJ SOLN
50.0000 ug | Freq: Once | INTRAMUSCULAR | Status: AC
  Administered 2018-07-03: 02:00:00 50 ug via INTRAVENOUS
  Filled 2018-07-03: qty 2

## 2018-07-03 MED ORDER — HYDROCODONE-ACETAMINOPHEN 5-325 MG PO TABS
1.0000 | ORAL_TABLET | Freq: Once | ORAL | Status: AC
  Administered 2018-07-03: 1 via ORAL
  Filled 2018-07-03: qty 1

## 2018-07-03 NOTE — ED Notes (Signed)
Patient verbalizes understanding of discharge instructions. Opportunity for questioning and answers were provided. Armband removed by staff, pt discharged from ED by wheelchair   

## 2018-07-03 NOTE — ED Notes (Signed)
Sling applied .

## 2018-07-03 NOTE — ED Notes (Signed)
Placed pt on purewick  

## 2018-07-03 NOTE — Discharge Instructions (Signed)
A consult for home health has been placed.  You should be contacted sometime tomorrow regarding the scheduling of the assessment.   Please follow-up with your orthopedist regarding your left shoulder fracture.

## 2018-07-03 NOTE — ED Notes (Signed)
Spoke with patient's sister. States she will come pick up the patient.

## 2018-07-03 NOTE — ED Provider Notes (Signed)
[facility] EMERGENCY DEPARTMENT Provider Note  CSN: [id number] Arrival date & time: 07/03/18 0054  Chief Complaint(s) Fall  HPI Cindy Richmond is a 68 y.o. female with a history of Parkinson's disease who presents to the emergency department after mechanical fall resulting in left shoulder pain.  She reports that after getting up from the toilet in the bathroom, she felt her knees and legs give away causing her to fall.  She landed on her left side.  Endorsed hitting her face on the closet door but states that this was a mild blow.  Left shoulder pain is severe throbbing that is exacerbated with movement and palpation.  Alleviated mildly by immobilization.  Patient was given fentanyl by EMS who did provide significant pain relief.  No other alleviating or aggravating factors.  Patient denies any other physical complaints including headache, neck pain, back pain, chest pain, abdominal pain, hip pain, lower extremity pain, right upper extremity pain.  HPI  Past Medical History Past Medical History:  Diagnosis Date  . Aortic stenosis   . GERD (gastroesophageal reflux disease)   . H/O hiatal hernia   . Heart murmur    asymptomatic  . Hematuria   . HOH (hard of hearing)    sightly, bilaterally  . Hyperlipidemia   . Hypertension   . Hypothyroidism   . Kidney stone on left side    Patient Active Problem List   Diagnosis Date Noted  . Parkinson's disease (HCC) 07/31/2017  . RBD (REM behavioral disorder) 07/31/2017  . Aortic stenosis, moderate 04/22/2016  . Obstructive sleep apnea 04/22/2016  . Dyspnea on exertion 04/22/2016  . Night terrors, adult 04/22/2016  . Obesity, morbid (HCC) 04/22/2016   Home Medication(s) Prior to Admission medications   Medication Sig Start Date End Date Taking? Authorizing Provider  acetaminophen (TYLENOL) 500 MG tablet Take 2 tablets (1,000 mg total) by mouth every 8 (eight) hours for 5 days. Do not take more than 4000 mg of  acetaminophen (Tylenol) in a 24-hour period. Please note that other medicines that you may be prescribed may have Tylenol as well. 07/03/18 07/08/18  Nira Conn, MD  bisoprolol-hydrochlorothiazide [facility]) 5-6.25 MG per tablet Take 1 tablet by mouth daily.    [provider]  carbidopa-levodopa (SINEMET IR) 25-100 MG tablet Take 1 tablet by mouth 3 (three) times daily. 12/09/17   Tat, Octaviano Batty, DO  cholecalciferol (VITAMIN D) 1000 units tablet Take 2,000 Units by mouth daily.    [provider]  esomeprazole (NEXIUM) 20 MG capsule Take 20 mg by mouth daily at 12 noon.    [provider]  Glucosamine-Chondroit-Vit C-Mn (GLUCOSAMINE 1500 COMPLEX) CAPS Take by mouth.    [provider]  HYDROcodone-acetaminophen (NORCO/VICODIN) 5-325 MG tablet Take 1 tablet by mouth every 8 (eight) hours as needed for up to 5 days for severe pain (That is not improved by your scheduled acetaminophen regimen). Please do not exceed 4000 mg of acetaminophen (Tylenol) a 24-hour period. Please note that he may be prescribed additional medicine that contains acetaminophen. 07/03/18 07/08/18  Nira Conn, MD  levothyroxine (SYNTHROID, LEVOTHROID) 75 MCG tablet Take 75 mcg by mouth daily.    [provider]  vitamin B-12 (CYANOCOBALAMIN) 500 MCG tablet Take 500 mcg by mouth daily.    [provider]  Past Surgical History Past Surgical History:  Procedure Laterality Date  . ABDOMINAL HYSTERECTOMY     lso  . CYSTOSCOPY W/ RETROGRADES  02/28/2012   Procedure: CYSTOSCOPY WITH RETROGRADE PYELOGRAM;  Surgeon: Sebastian Acheheodore Manny, MD;  [facility] [facility] [place];  Service: Urology;  Laterality: Right;  . CYSTOSCOPY WITH STENT PLACEMENT  02/28/2012   Procedure: CYSTOSCOPY WITH STENT PLACEMENT;  Surgeon: Sebastian Acheheodore Manny, MD;  [facility] [place];  Service: Urology;  Laterality: Left;  . CYSTOSCOPY/RETROGRADE/URETEROSCOPY/STONE EXTRACTION WITH BASKET  02/28/2012   Procedure: CYSTOSCOPY/RETROGRADE/URETEROSCOPY/STONE EXTRACTION WITH BASKET;  Surgeon: Sebastian Acheheodore Manny, MD;  [facility] [place];  Service: Urology;  Laterality: Left;  . HOLMIUM LASER APPLICATION  02/28/2012   Procedure: HOLMIUM LASER APPLICATION;  Surgeon: Sebastian Acheheodore Manny, MD;  [facility] [place];  Service: Urology;  Laterality: Left;  . KNEE ARTHROSCOPY     right  . TONSILLECTOMY    . VEIN LIGATION     Family History Family History  Problem Relation Age of Onset  . CAD Mother   . Heart failure Father   . Healthy Sister   . Healthy Son     Social History Social History   Tobacco Use  . Smoking status: Never Smoker  . Smokeless tobacco: Never Used  Substance Use Topics  . Alcohol use: Never    Frequency: Never  . Drug use: Never   Allergies Penicillins  Review of Systems Review of Systems All other systems are reviewed and are negative for acute change except as noted in the HPI  Physical Exam Vital Signs  I have reviewed the triage vital signs BP 135/80   Pulse 80   Temp 97.6 F (36.4 C) (Oral)   Resp 18   Ht 5\' 6"  (1.676 m)   Wt 131.5 kg   SpO2 94%   BMI 46.81 kg/m   Physical Exam Constitutional:      General: She is not in acute distress.    Appearance: She is well-developed. She is not diaphoretic.  HENT:     Head: Normocephalic. Contusion present.      Right Ear: External ear normal.     Left Ear: External ear normal.     Nose: Nose normal.  Eyes:     General: No scleral icterus.       Right eye: No discharge.        Left eye: No discharge.     Conjunctiva/sclera: Conjunctivae normal.     Pupils: Pupils are equal, round, and reactive to light.  Neck:     Musculoskeletal: Normal range of motion and neck supple.  Cardiovascular:     Rate and Rhythm: Normal rate and  regular rhythm.     Pulses:          Radial pulses are 2+ on the right side and 2+ on the left side.       Dorsalis pedis pulses are 2+ on the right side and 2+ on the left side.     Heart sounds: Normal heart sounds. No murmur. No friction rub. No gallop.   Pulmonary:     Effort: Pulmonary effort is normal. No respiratory distress.     Breath sounds: Normal breath sounds. No stridor. No wheezing.  Abdominal:     General: There is no distension.     Palpations: Abdomen is soft.     Tenderness: There is no abdominal tenderness.  Musculoskeletal:     Left shoulder: She exhibits tenderness, bony tenderness and  swelling. She exhibits normal pulse and normal strength.     Cervical back: She exhibits no bony tenderness.     Thoracic back: She exhibits no bony tenderness.     Lumbar back: She exhibits no bony tenderness.     Comments: Clavicles stable. Chest stable to AP/Lat compression. Pelvis stable to Lat compression. No obvious extremity deformity. No chest or abdominal wall contusion.  Skin:    General: Skin is warm and dry.     Findings: No erythema or rash.  Neurological:     Mental Status: She is alert and oriented to person, place, and time.     Motor: Tremor (resting) present.     Comments: Moving all extremities     ED Results and Treatments Labs (all labs ordered are listed, but only abnormal results are displayed) Labs Reviewed - No data to display                                                                                                                       EKG  EKG Interpretation  Date/Time:    Ventricular Rate:    PR Interval:    QRS Duration:   QT Interval:    QTC Calculation:   R Axis:     Text Interpretation:        Radiology Dg Forearm Left  Result Date: 07/03/2018 CLINICAL DATA:  68 y/o F; fall at home onto the left side. Felt a pop in the arm. EXAM: LEFT FOREARM - 2 VIEW; LEFT SHOULDER - 2+ VIEW; LEFT HUMERUS - 2+ VIEW COMPARISON:  None.  FINDINGS: Left shoulder: Acute comminuted and impacted surgical neck fracture of the left proximal humerus. No glenohumeral joint dislocation. Normal acromioclavicular and coracoclavicular intervals. Left humerus: Acute comminuted and impacted surgical neck fracture of the left proximal humerus. No additional fracture of the humerus identified. Left forearm: No acute fracture or dislocation identified. Elbow joint is maintained. Osteoarthrosis of the basal joint. Normal radiocarpal articulation. IMPRESSION: 1. Acute comminuted and impacted surgical neck fracture of the left proximal humerus. 2. No additional fracture or dislocation identified. Electronically Signed   By: Mitzi Hansen M.D.   On: 07/03/2018 02:56   Dg Shoulder Left  Result Date: 07/03/2018 CLINICAL DATA:  68 y/o F; fall at home onto the left side. Felt a pop in the arm. EXAM: LEFT FOREARM - 2 VIEW; LEFT SHOULDER - 2+ VIEW; LEFT HUMERUS - 2+ VIEW COMPARISON:  None. FINDINGS: Left shoulder: Acute comminuted and impacted surgical neck fracture of the left proximal humerus. No glenohumeral joint dislocation. Normal acromioclavicular and coracoclavicular intervals. Left humerus: Acute comminuted and impacted surgical neck fracture of the left proximal humerus. No additional fracture of the humerus identified. Left forearm: No acute fracture or dislocation identified. Elbow joint is maintained. Osteoarthrosis of the basal joint. Normal radiocarpal articulation. IMPRESSION: 1. Acute comminuted and impacted surgical neck fracture of the left proximal humerus. 2. No additional fracture or dislocation identified. Electronically Signed   By: Micah Noel  [place] M.D.   On: 07/03/2018 02:56   Dg Humerus Left  Result Date: 07/03/2018 CLINICAL DATA:  68 y/o F; fall at home onto the left side. Felt a pop in the arm. EXAM: LEFT FOREARM - 2 VIEW; LEFT SHOULDER - 2+ VIEW; LEFT HUMERUS - 2+ VIEW COMPARISON:  None. FINDINGS: Left shoulder: Acute  comminuted and impacted surgical neck fracture of the left proximal humerus. No glenohumeral joint dislocation. Normal acromioclavicular and coracoclavicular intervals. Left humerus: Acute comminuted and impacted surgical neck fracture of the left proximal humerus. No additional fracture of the humerus identified. Left forearm: No acute fracture or dislocation identified. Elbow joint is maintained. Osteoarthrosis of the basal joint. Normal radiocarpal articulation. IMPRESSION: 1. Acute comminuted and impacted surgical neck fracture of the left proximal humerus. 2. No additional fracture or dislocation identified. Electronically Signed   By: Mitzi Hansen M.D.   On: 07/03/2018 02:56   Pertinent labs & imaging results that were available during my care of the patient were reviewed by me and considered in my medical decision making (see chart for details).  Medications Ordered in ED Medications  fentaNYL (SUBLIMAZE) injection 50 mcg (50 mcg Intravenous Given 07/03/18 0151)  HYDROcodone-acetaminophen (NORCO/VICODIN) 5-325 MG per tablet 1 tablet (1 tablet Oral Given 07/03/18 0354)                                                                                                                                    Procedures Procedures  (including critical care time)  Medical Decision Making / ED Course I have reviewed the nursing notes for this encounter and the patient's prior records (if available in EHR or on provided paperwork).    Mechanical fall resulting in left humeral neck fracture.  No other injuries noted on work-up.  Patient was provided with additional pain medicine and sling.  Discussed options for admission versus DC home.  She reported that she felt safe being discharged home she has a sister that can help take care of her.  Patient was amicable to having home health evaluation.  Case management consult was placed and face-to-face orders.  Patient states she is already established  with [facility].  The patient appears reasonably screened and/or stabilized for discharge and I doubt any other medical condition or other [facility] requiring further screening, evaluation, or treatment in the ED at this time prior to discharge.  The patient is safe for discharge with strict return precautions.   Final Clinical Impression(s) / ED Diagnoses Final diagnoses:  Fall  Closed displaced fracture of surgical neck of left humerus, unspecified fracture morphology, initial encounter    Disposition: Discharge  Condition: Good  I have discussed the results, Dx and Tx plan with the patient who expressed understanding and agree(s) with the plan. Discharge instructions discussed at great length. The patient was given strict return precautions who verbalized understanding of the instructions. No further questions at time of discharge.  ED Discharge Orders         Ordered    acetaminophen (TYLENOL) 500 MG tablet  Every 8 hours     07/03/18 0411    HYDROcodone-acetaminophen (NORCO/VICODIN) 5-325 MG tablet  Every 8 hours PRN     07/03/18 0411    Consult to care management    Provider:  (Not yet assigned)   07/03/18 0411    Home Health     07/03/18 0411    Face-to-face encounter (required for Medicare/Medicaid patients)    Comments:  I Amadeo Garnet Cardama certify that this patient is under my care and that I, or a nurse practitioner or physician's assistant working with me, had a face-to-face encounter that meets the physician face-to-face encounter requirements with this patient on 07/03/2018. The encounter with the patient was in whole, or in part for the following medical condition(s) which is the primary reason for home health care (List medical condition): History of Parkinson's disease resulting in fall and left humerus fracture   07/03/18 0411           Follow Up: Mila Palmer, MD [address] [place] [address] [phone]  Schedule an  appointment as soon as possible for a visit  As needed for additional pain control  Specialists, [facility] [address] [place] [address] [phone]  Schedule an appointment as soon as possible for a visit  For close follow up to assess for your left humerus fracture     This chart was dictated using voice recognition software.  Despite best efforts to proofread,  errors can occur which can change the documentation meaning.   Nira Conn, MD 07/03/18 [phone]

## 2018-07-03 NOTE — ED Triage Notes (Signed)
Pt coming by EMS after fall at home on left side. No LOC. Pt "felt a pop" in left arm. Vitals WDL. 100 mcg of Fentanyl given by EMS. Pt not on blood thinners.

## 2018-07-09 ENCOUNTER — Ambulatory Visit
Admission: RE | Admit: 2018-07-09 | Discharge: 2018-07-09 | Disposition: A | Discharge status: Home / Self Care | Payer: Medicare Other | Source: Ambulatory Visit | Attending: Orthopaedic Surgery | Admitting: Orthopaedic Surgery

## 2018-07-09 DIAGNOSIS — M25512 Pain in left shoulder: Secondary | ICD-10-CM

## 2018-07-13 ENCOUNTER — Telehealth: Payer: Self-pay

## 2018-07-13 NOTE — Telephone Encounter (Signed)
   [facility] Pre-operative Risk Assessment    Request for surgical clearance:  1. What type of surgery is being performed? Left total shoulder replacement due to proximal humerus fracture   2. When is this surgery scheduled? 07/22/18   3. What type of clearance is required (medical clearance vs. Pharmacy clearance to hold med vs. Both)? Medical  4. Are there any medications that need to be held prior to surgery and how long? Not specified    5. Practice name and name of physician performing surgery? [facility] (Dr. Griffin Basil, MD)   6. What is your office phone number [phone] ext. [place]   What is your office fax number [phone]  8.   Anesthesia type (None, local, MAC, general) ? Unknown   Meryl Crutch 07/13/2018, 2:26 PM  _________________________________________________________________   (provider comments below)

## 2018-07-14 ENCOUNTER — Telehealth (HOSPITAL_COMMUNITY): Payer: Self-pay | Admitting: *Deleted

## 2018-07-14 ENCOUNTER — Encounter: Payer: Self-pay | Admitting: Cardiology

## 2018-07-14 ENCOUNTER — Telehealth (INDEPENDENT_AMBULATORY_CARE_PROVIDER_SITE_OTHER): Payer: Medicare Other | Admitting: Cardiology

## 2018-07-14 ENCOUNTER — Encounter: Payer: Self-pay | Admitting: *Deleted

## 2018-07-14 ENCOUNTER — Telehealth: Payer: Self-pay | Admitting: Neurology

## 2018-07-14 VITALS — BP 166/103 | HR 71 | Ht 66.0 in | Wt 289.0 lb

## 2018-07-14 DIAGNOSIS — Z01810 Encounter for preprocedural cardiovascular examination: Secondary | ICD-10-CM

## 2018-07-14 DIAGNOSIS — R0600 Dyspnea, unspecified: Secondary | ICD-10-CM

## 2018-07-14 DIAGNOSIS — I10 Essential (primary) hypertension: Secondary | ICD-10-CM

## 2018-07-14 DIAGNOSIS — I350 Nonrheumatic aortic (valve) stenosis: Secondary | ICD-10-CM | POA: Diagnosis not present

## 2018-07-14 NOTE — Telephone Encounter (Signed)
   Primary Cardiologist:Brian Jens Som, MD  Chart reviewed as part of pre-operative protocol coverage. Because of Cindy Richmond's past medical history and time since last visit, he/she will require a follow-up visit in order to better assess preoperative cardiovascular risk.  Pre-op covering staff: - Please schedule appointment and call patient to inform them.  Patient has a history of moderate AS and hasn't been seen since 02/2017. Please schedule patient for a virtual visit with Dr. Jens Som or an APP on his team. VIDEO preferred. Surgery is scheduled for 07/22/18.  - Please contact requesting surgeon's office via preferred method (i.e, phone, fax) to inform them of need for appointment prior to surgery.  If applicable, this message will also be routed to pharmacy pool and/or primary cardiologist for input on holding anticoagulant/antiplatelet agent as requested below so that this information is available at time of patient's appointment.   Cindy Rutherford Jahiem Franzoni, PA  07/14/2018, 8:13 AM

## 2018-07-14 NOTE — Patient Instructions (Signed)
Medication Instructions:  NO CHANGE If you need a refill on your cardiac medications before your next appointment, please call your pharmacy.   Lab work: If you have labs (blood work) drawn today and your tests are completely normal, you will receive your results only by: . MyChart Message (if you have MyChart) OR . A paper copy in the mail If you have any lab test that is abnormal or we need to change your treatment, we will call you to review the results.  Testing/Procedures: Your physician has requested that you have an echocardiogram. Echocardiography is a painless test that uses sound waves to create images of your heart. It provides your doctor with information about the size and shape of your heart and how well your heart's chambers and valves are working. This procedure takes approximately one hour. There are no restrictions for this procedure.[address]    Follow-Up: At [facility], you and your health needs are our priority.  As part of our continuing mission to provide you with exceptional heart care, we have created designated Provider Care Teams.  These Care Teams include your primary Cardiologist (physician) and Advanced Practice Providers (APPs -  Physician Assistants and Nurse Practitioners) who all work together to provide you with the care you need, when you need it. You will need a follow up appointment in 6 months.  Please call our office 2 months in advance to schedule this appointment.  You may see Brian Crenshaw, MD or one of the following Advanced Practice Providers on your designated Care Team:   Luke Kilroy, PA-C Krista Kroeger, PA-C . Callie Goodrich, PA-C     

## 2018-07-14 NOTE — Telephone Encounter (Signed)
noted 

## 2018-07-14 NOTE — Telephone Encounter (Signed)
Received request by Cindy Richmond for "clearance" for surgery.  Cannot state how optimized the patient is from a Parkinson's standpoint without a visit since I have not seen her since October.  Please asked the patient to make a video visit.  [employer], please hold that form until visit

## 2018-07-14 NOTE — Telephone Encounter (Signed)
She is sch for 07-16-18 for a EVISIT

## 2018-07-14 NOTE — Progress Notes (Signed)
Virtual Visit via Video Note The purpose of this virtual visit is to provide medical care while limiting exposure to the novel coronavirus.    Consent was obtained for video visit:  Yes.   Answered questions that patient had about telehealth interaction:  Yes.   I discussed the limitations, risks, security and privacy concerns of performing an evaluation and management service by telemedicine. I also discussed with the patient that there may be a patient responsible charge related to this service. The patient expressed understanding and agreed to proceed.  Pt location: Home Physician Location: home Name of referring provider:  Mila Palmer, MD I connected with [name] at patients initiation/request on 07/16/2018 at  1:00 PM EDT by video enabled telemedicine application and verified that I am speaking with the correct person using two identifiers. Pt MRN:  [id number] Pt DOB:  26-Dec-1950 Video Participants:  Tedi P Blossom Hoops (sister)   History of Present Illness:  Patient is seen today for follow-up of parkinsonism.  She is currently on carbidopa/levodopa 25/100, 1 tablet at 8 AM/noon/4 PM/8 PM.  She has refused a dopamine agonist.  I have reviewed medical record since our last visit.  She was in the emergency room on Jul 03, 2018 after a fall, causing left shoulder pain.  She doesn't know what caused her to fall.  This resulted in a left humeral neck fracture.  Patient is awaiting surgery.  Surgeon has requested "neurologic clearance."   Current Outpatient Medications on File Prior to Visit  Medication Sig Dispense Refill  . acetaminophen (TYLENOL) 500 MG tablet Take 1,000 mg by mouth every 4 (four) hours as needed for moderate pain or headache.     . bisoprolol-hydrochlorothiazide (ZIAC) 5-6.25 MG per tablet Take 1 tablet by mouth daily.    . carbidopa-levodopa (SINEMET IR) 25-100 MG tablet Take 1 tablet by mouth 4 (four) times daily.    Marland Kitchen esomeprazole (NEXIUM) 20 MG capsule Take  20 mg by mouth daily.     Marland Kitchen levothyroxine (SYNTHROID, LEVOTHROID) 75 MCG tablet Take 75 mcg by mouth daily.    . Liniments (SALONPAS PAIN RELIEF PATCH EX) Apply 1 patch topically daily as needed (pain).    Bertram Gala Glycol-Propyl Glycol (SYSTANE OP) Place 1 drop into both eyes daily as needed (dry eyes).     No current facility-administered medications on file prior to visit.      Observations/Objective:   Vitals:   07/16/18 1227  Weight: 280 lb (127 kg)  Height: 5\' 6"  (1.676 m)   GEN:  The patient appears stated age and is in NAD.  Neurological examination:  Orientation: The patient is alert and oriented x3. Cranial nerves: There is good facial symmetry. There is minimal facial hypomimia.  The speech is fluent and clear. Soft palate rises symmetrically and there is no tongue deviation. Hearing is intact to conversational tone. Motor: Strength is at least antigravity x 4.   Shoulder shrug is equal and symmetric.  There is no pronator drift.  Movement examination: Tone: unable Abnormal movements: There is very mild right upper extremity resting tremor Coordination:  There is no decremation with RAM's, with hand opening and closing or finger taps or toe taps bilaterally Gait and Station: The patient pushes off of her kitchen table.  She walks well in her home.    Assessment and Plan:   1.  Parkinsonism.  I suspect that this does represent idiopathic Parkinson's disease but I did tell her that, based on  pseudobulbar speech, an atypical state cannot be ruled out.  Time will help us differentiate these clinically.  The patient has tremor, bradykinesia, rigidity and mild postural instability.             -Continue carbidopa/levodopa 25/100, 1 tablet 4 times per day.  -Patient has refused dopamine agonist.  Discussed with the patient that she needs to take her levodopa the morning of surgery.  -Discussed risks of anesthesia and how that affects patients with Parkinson's different than  other patients.  -From my standpoint, the benefits to surgery in her to clearly outweigh the risks, given that she needs the surgery to fix her humeral neck fracture.  She is as optimized from a Parkinson's standpoint as she is going to be right now.  Following surgery, she may need some physical therapy.  2. REM behavior disorder             -She has not noted significant behavioral issues in this regard since our last visit.  She we will continue to monitor and will let me now.  3. Urinary frequency             -Encouraged her to make a [facility], where she has been seen in the past.  4.  Sweating             -This can be associated with Parkinson's disease.  Talked to her about clonidine.  She declined for now.  Follow Up Instructions:  Patient has a follow-up in September.  She wishes to keep that appointment.   -I discussed the assessment and treatment plan with the patient. The patient was provided an opportunity to ask questions and all were answered. The patient agreed with the plan and demonstrated an understanding of the instructions.   The patient was advised to call back or seek an in-person evaluation if the symptoms worsen or if the condition fails to improve as anticipated.    Total Time spent in visit with the patient was:  15 min, of which more than 50% of the time was spent in counseling and/or coordinating care, as above.   Pt understands and agrees with the plan of care outlined.     Kerin Salenebecca Avice Funchess, DO

## 2018-07-14 NOTE — Telephone Encounter (Signed)
Pt is scheduled for a Virtual Visit via Video with Dr. Jens Somrenshaw. Sent consent to mychart, but pt also gave verbal consent for this visit.      Virtual Visit Pre-Appointment Phone Call  "(Name), I am calling you today to discuss your upcoming appointment. We are currently trying to limit exposure to the virus that causes COVID-19 by seeing patients at home rather than in the office."  1. "What is the BEST phone number to call the day of the visit?" - include this in appointment notes  2. "Do you have or have access to (through a family member/friend) a smartphone with video capability that we can use for your visit?" a. If yes - list this number in appt notes as "cell" (if different from BEST phone #) and list the appointment type as a VIDEO visit in appointment notes b. If no - list the appointment type as a PHONE visit in appointment notes  3. Confirm consent - "In the setting of the current Covid19 crisis, you are scheduled for a (phone or video) visit with your provider on (date) at (time).  Just as we do with many in-office visits, in order for you to participate in this visit, we must obtain consent.  If you'd like, I can send this to your mychart (if signed up) or email for you to review.  Otherwise, I can obtain your verbal consent now.  All virtual visits are billed to your insurance company just like a normal visit would be.  By agreeing to a virtual visit, we'd like you to understand that the technology does not allow for your provider to perform an examination, and thus may limit your provider's ability to fully assess your condition. If your provider identifies any concerns that need to be evaluated in person, we will make arrangements to do so.  Finally, though the technology is pretty good, we cannot assure that it will always work on either your or our end, and in the setting of a video visit, we may have to convert it to a phone-only visit.  In either situation, we cannot ensure that  we have a secure connection.  Are you willing to proceed?" STAFF: Did the patient verbally acknowledge consent to telehealth visit? Document YES/NO here: YES  4. Advise patient to be prepared - "Two hours prior to your appointment, go ahead and check your blood pressure, pulse, oxygen saturation, and your weight (if you have the equipment to check those) and write them all down. When your visit starts, your provider will ask you for this information. If you have an Apple Watch or Kardia device, please plan to have heart rate information ready on the day of your appointment. Please have a pen and paper handy nearby the day of the visit as well."  5. Give patient instructions for MyChart download to smartphone OR Doximity/Doxy.me as below if video visit (depending on what platform provider is using)  6. Inform patient they will receive a phone call 15 minutes prior to their appointment time (may be from unknown caller ID) so they should be prepared to answer    TELEPHONE CALL NOTE  Cindy Richmond has been deemed a candidate for a follow-up tele-health visit to limit community exposure during the Covid-19 pandemic. I spoke with the patient via phone to ensure availability of phone/video source, confirm preferred email & phone number, and discuss instructions and expectations.  I reminded Cindy Richmond to be prepared with any vital sign and/or heart  rhythm information that could potentially be obtained via home monitoring, at the time of her visit. I reminded Cindy Richmond to expect a phone call prior to her visit.  Elliot Cousin, RMA 07/14/2018 8:37 AM   INSTRUCTIONS FOR DOWNLOADING THE MYCHART APP TO SMARTPHONE  - The patient must first make sure to have activated MyChart and know their login information - If Apple, go to [employer] and type in MyChart in the search bar and download the app. If Android, ask patient to go to [employer] and type in [place] in the search bar and download the  app. The app is free but as with any other app downloads, their phone may require them to verify saved payment information or Apple/Android password.  - The patient will need to then log into the app with their MyChart username and password, and select [facility] as their healthcare provider to link the account. When it is time for your visit, go to the MyChart app, find appointments, and click Begin Video Visit. Be sure to Select Allow for your device to access the Microphone and Camera for your visit. You will then be connected, and your provider will be with you shortly.  **If they have any issues connecting, or need assistance please contact MyChart service desk [phone])**  **If using a computer, in order to ensure the best quality for their visit they will need to use either of the following Internet Browsers: [employer] or Google Chrome**  IF USING DOXIMITY or DOXY.ME - The patient will receive a link just prior to their visit by text.     FULL LENGTH CONSENT FOR TELE-HEALTH VISIT   I hereby voluntarily request, consent and authorize [facility] and its employed or contracted physicians, physician assistants, nurse practitioners or other licensed health care professionals (the Practitioner), to provide me with telemedicine health care services (the "Services") as deemed necessary by the treating Practitioner. I acknowledge and consent to receive the Services by the Practitioner via telemedicine. I understand that the telemedicine visit will involve communicating with the Practitioner through live audiovisual communication technology and the disclosure of certain medical information by electronic transmission. I acknowledge that I have been given the opportunity to request an in-person assessment or other available alternative prior to the telemedicine visit and am voluntarily participating in the telemedicine visit.  I understand that I have the right to withhold or  withdraw my consent to the use of telemedicine in the course of my care at any time, without affecting my right to future care or treatment, and that the Practitioner or I may terminate the telemedicine visit at any time. I understand that I have the right to inspect all information obtained and/or recorded in the course of the telemedicine visit and may receive copies of available information for a reasonable fee.  I understand that some of the potential risks of receiving the Services via telemedicine include:  Marland Kitchen Delay or interruption in medical evaluation due to technological equipment failure or disruption; . Information transmitted may not be sufficient (e.g. poor resolution of images) to allow for appropriate medical decision making by the Practitioner; and/or  . In rare instances, security protocols could fail, causing a breach of personal health information.  Furthermore, I acknowledge that it is my responsibility to provide information about my medical history, conditions and care that is complete and accurate to the best of my ability. I acknowledge that Practitioner's advice, recommendations, and/or decision may be based on  factors not within their control, such as incomplete or inaccurate data provided by me or distortions of diagnostic images or specimens that may result from electronic transmissions. I understand that the practice of medicine is not an exact science and that Practitioner makes no warranties or guarantees regarding treatment outcomes. I acknowledge that I will receive a copy of this consent concurrently upon execution via email to the email address I last provided but may also request a printed copy by calling the office of [facility].    I understand that my insurance will be billed for this visit.   I have read or had this consent read to me. . I understand the contents of this consent, which adequately explains the benefits and risks of the Services being provided via  telemedicine.  . I have been provided ample opportunity to ask questions regarding this consent and the Services and have had my questions answered to my satisfaction. . I give my informed consent for the services to be provided through the use of telemedicine in my medical care  By participating in this telemedicine visit I agree to the above.

## 2018-07-14 NOTE — Progress Notes (Signed)
Virtual Visit via Video Note   This visit type was conducted due to national recommendations for restrictions regarding the COVID-19 Pandemic (e.g. social distancing) in an effort to limit this patient's exposure and mitigate transmission in our community.  Due to her co-morbid illnesses, this patient is at least at moderate risk for complications without adequate follow up.  This format is felt to be most appropriate for this patient at this time.  All issues noted in this document were discussed and addressed.  A limited physical exam was performed with this format.  Please refer to the patient's chart for her consent to telehealth for [facility].   Date:  07/14/2018   ID:  Cindy Richmond, Cindy Richmond Mar 17, 1950, MRN [id number]  Patient Location: Home Provider Location: Home  PCP:  Mila Palmer, MD  Cardiologist:  [name], MD   Evaluation Performed:  Follow-Up Visit  Chief Complaint:  FU AS  History of Present Illness:    FU AS. Echo 11/17 showed normal LV function, moderate LVH, moderate AS with mean gradient 24 mmHg and mild RAE. Echocardiogram repeated January 2019 and showed normal LV function, grade 1 diastolic dysfunction, moderate aortic stenosis with mean gradient 25 mmHg.  Since last seen,  she has some dyspnea on exertion which is unchanged.  She states it is no different compared to 1 year ago.  No orthopnea or PND.  She denies chest pain or syncope.  The patient does not have symptoms concerning for COVID-19 infection (fever, chills, cough, or new shortness of breath).    Past Medical History:  Diagnosis Date  . Aortic stenosis   . GERD (gastroesophageal reflux disease)   . H/O hiatal hernia   . Heart murmur    asymptomatic  . Hematuria   . HOH (hard of hearing)    sightly, bilaterally  . Hyperlipidemia   . Hypertension   . Hypothyroidism   . Kidney stone on left side    Past Surgical History:  Procedure Laterality Date  . ABDOMINAL HYSTERECTOMY     lso   . CYSTOSCOPY W/ RETROGRADES  02/28/2012   Procedure: CYSTOSCOPY WITH RETROGRADE PYELOGRAM;  Surgeon: Sebastian Ache, MD;  Location: [facility];  Service: Urology;  Laterality: Right;  . CYSTOSCOPY WITH STENT PLACEMENT  02/28/2012   Procedure: CYSTOSCOPY WITH STENT PLACEMENT;  Surgeon: Sebastian Ache, MD;  Location: [facility];  Service: Urology;  Laterality: Left;  . CYSTOSCOPY/RETROGRADE/URETEROSCOPY/STONE EXTRACTION WITH BASKET  02/28/2012   Procedure: CYSTOSCOPY/RETROGRADE/URETEROSCOPY/STONE EXTRACTION WITH BASKET;  Surgeon: Sebastian Ache, MD;  Location[facility];  Service: Urology;  Laterality: Left;  . HOLMIUM LASER APPLICATION  02/28/2012   Procedure: HOLMIUM LASER APPLICATION;  Surgeon: Sebastian Ache, MD;  Location: [facility];  Service: Urology;  Laterality: Left;  . KNEE ARTHROSCOPY     right  . TONSILLECTOMY    . VEIN LIGATION       Current Meds  Medication Sig  . acetaminophen (TYLENOL) 500 MG tablet Take 1,000 mg by mouth every 4 (four) hours as needed.  . bisoprolol-hydrochlorothiazide (ZIAC) 5-6.25 MG per tablet Take 1 tablet by mouth daily.  . carbidopa-levodopa (SINEMET IR) 25-100 MG tablet Take 1 tablet by mouth 4 (four) times daily.  . cholecalciferol (VITAMIN D) 1000 units tablet Take 2,000 Units by mouth daily.  Marland Kitchen esomeprazole (NEXIUM) 20 MG capsule Take 20 mg by mouth daily at 12 noon.  . Glucosamine-Chondroit-Vit C-Mn (GLUCOSAMINE 1500 COMPLEX) CAPS Take 1 capsule by mouth  daily.   . levothyroxine (SYNTHROID, LEVOTHROID) 75 MCG tablet Take 75 mcg by mouth daily.  Marland Kitchen. oxyCODONE (OXY IR/ROXICODONE) 5 MG immediate release tablet Take 1 tablet by mouth daily.  . vitamin B-12 (CYANOCOBALAMIN) 500 MCG tablet Take 500 mcg by mouth daily.     Allergies:   Penicillins   Social History   Tobacco Use  . Smoking status: Never Smoker  . Smokeless tobacco: Never Used  Substance Use Topics  . Alcohol use: Never     Frequency: Never  . Drug use: Never     Family Hx: The patient's family history includes CAD in her mother; Healthy in her sister and son; Heart failure in her father.  ROS:   Please see the history of present illness.    No fevers, chills or productive cough.  Patient fell recently and has residual pain in shoulder from fracture. All other systems reviewed and are negative.  Wt Readings from Last 3 Encounters:  07/14/18 289 lb (131.1 kg)  07/03/18 290 lb (131.5 kg)  05/12/18 289 lb (131.1 kg)     Objective:    Vital Signs:  BP (!) 166/103   Pulse 71   Ht 5\' 6"  (1.676 m)   Wt 289 lb (131.1 kg)   BMI 46.65 kg/m    VITAL SIGNS:  reviewed  No acute distress Answers questions appropriately Normal affect Remainder of physical examination not performed (telehealth visit; coronavirus pandemic)  ASSESSMENT & PLAN:    1. Preoperative evaluation prior to shoulder surgery-patient fell recently fracturing her shoulder by report.  This will require surgical intervention.  She does not have chest pain and her dyspnea on exertion is chronic and unchanged.  I will arrange an echocardiogram to reassess aortic stenosis preoperatively.  If it is unchanged he may proceed. 2. Aortic stenosis-we will arrange follow-up echocardiogram.  We discussed the symptoms to be aware of including worsening dyspnea, chest pain or syncope.  She understands she will likely require aortic valve replacement in the future. 3. Hypertension-patient's blood pressure is elevated; however she states typically controlled.  We will continue present regimen and add additional medications if needed. 4. Dyspnea-this is felt to be multifactorial including deconditioning, obesity hypoventilation syndrome and possible sleep apnea. 5. Morbid obesity-we discussed the importance of weight loss and diet.  COVID-19 Education: The importance of social distancing was discussed today.  Time:   Today, I have spent 12 minutes with  the patient with telehealth technology discussing the above problems.     Medication Adjustments/Labs and Tests Ordered: Current medicines are reviewed at length with the patient today.  Concerns regarding medicines are outlined above.   Tests Ordered: No orders of the defined types were placed in this encounter.   Medication Changes: No orders of the defined types were placed in this encounter.   Disposition:  Follow up in 6 month(s)  Signed, Olga MillersBrian Keili Hasten, MD  07/14/2018 9:26 AM    [facility]

## 2018-07-14 NOTE — Telephone Encounter (Signed)
Left message for patient to call office about appointment.(COVID 19) screening

## 2018-07-15 ENCOUNTER — Telehealth (HOSPITAL_COMMUNITY): Payer: Self-pay | Admitting: Cardiology

## 2018-07-15 NOTE — Telephone Encounter (Signed)

## 2018-07-16 ENCOUNTER — Encounter: Payer: Self-pay | Admitting: Neurology

## 2018-07-16 ENCOUNTER — Ambulatory Visit (HOSPITAL_COMMUNITY): Payer: Medicare Other | Attending: Internal Medicine

## 2018-07-16 ENCOUNTER — Other Ambulatory Visit: Payer: Self-pay

## 2018-07-16 ENCOUNTER — Telehealth (INDEPENDENT_AMBULATORY_CARE_PROVIDER_SITE_OTHER): Payer: Medicare Other | Admitting: Neurology

## 2018-07-16 DIAGNOSIS — G20A1 Parkinson's disease without dyskinesia, without mention of fluctuations: Secondary | ICD-10-CM

## 2018-07-16 DIAGNOSIS — Z01810 Encounter for preprocedural cardiovascular examination: Secondary | ICD-10-CM | POA: Diagnosis not present

## 2018-07-16 DIAGNOSIS — R0600 Dyspnea, unspecified: Secondary | ICD-10-CM | POA: Diagnosis not present

## 2018-07-16 DIAGNOSIS — G20 Parkinson's disease: Secondary | ICD-10-CM

## 2018-07-16 DIAGNOSIS — I350 Nonrheumatic aortic (valve) stenosis: Secondary | ICD-10-CM | POA: Insufficient documentation

## 2018-07-17 ENCOUNTER — Other Ambulatory Visit: Payer: Self-pay

## 2018-07-17 ENCOUNTER — Telehealth: Payer: Self-pay

## 2018-07-17 ENCOUNTER — Encounter (HOSPITAL_COMMUNITY): Payer: Self-pay

## 2018-07-17 ENCOUNTER — Other Ambulatory Visit (HOSPITAL_COMMUNITY)
Admission: RE | Admit: 2018-07-17 | Discharge: 2018-07-17 | Disposition: A | Discharge status: Home / Self Care | Payer: Medicare Other | Source: Ambulatory Visit | Attending: Orthopaedic Surgery | Admitting: Orthopaedic Surgery

## 2018-07-17 ENCOUNTER — Encounter (HOSPITAL_COMMUNITY)
Admission: RE | Admit: 2018-07-17 | Discharge: 2018-07-17 | Disposition: A | Discharge status: Home / Self Care | Payer: Medicare Other | Source: Ambulatory Visit | Attending: Orthopaedic Surgery | Admitting: Orthopaedic Surgery

## 2018-07-17 ENCOUNTER — Telehealth: Payer: Self-pay | Admitting: Cardiology

## 2018-07-17 DIAGNOSIS — E785 Hyperlipidemia, unspecified: Secondary | ICD-10-CM | POA: Insufficient documentation

## 2018-07-17 DIAGNOSIS — Z87442 Personal history of urinary calculi: Secondary | ICD-10-CM | POA: Diagnosis not present

## 2018-07-17 DIAGNOSIS — E039 Hypothyroidism, unspecified: Secondary | ICD-10-CM | POA: Diagnosis not present

## 2018-07-17 DIAGNOSIS — Z01818 Encounter for other preprocedural examination: Secondary | ICD-10-CM | POA: Insufficient documentation

## 2018-07-17 DIAGNOSIS — Z1159 Encounter for screening for other viral diseases: Secondary | ICD-10-CM | POA: Diagnosis not present

## 2018-07-17 DIAGNOSIS — S42202A Unspecified fracture of upper end of left humerus, initial encounter for closed fracture: Secondary | ICD-10-CM | POA: Diagnosis not present

## 2018-07-17 DIAGNOSIS — H9193 Unspecified hearing loss, bilateral: Secondary | ICD-10-CM | POA: Insufficient documentation

## 2018-07-17 DIAGNOSIS — X58XXXA Exposure to other specified factors, initial encounter: Secondary | ICD-10-CM | POA: Diagnosis not present

## 2018-07-17 DIAGNOSIS — Z79899 Other long term (current) drug therapy: Secondary | ICD-10-CM | POA: Diagnosis not present

## 2018-07-17 DIAGNOSIS — G20 Parkinson's disease: Secondary | ICD-10-CM | POA: Diagnosis not present

## 2018-07-17 DIAGNOSIS — Z79890 Hormone replacement therapy (postmenopausal): Secondary | ICD-10-CM | POA: Diagnosis not present

## 2018-07-17 DIAGNOSIS — K219 Gastro-esophageal reflux disease without esophagitis: Secondary | ICD-10-CM | POA: Diagnosis not present

## 2018-07-17 DIAGNOSIS — I10 Essential (primary) hypertension: Secondary | ICD-10-CM | POA: Diagnosis not present

## 2018-07-17 HISTORY — DX: Parkinson's disease: G20

## 2018-07-17 HISTORY — DX: Parkinson's disease without dyskinesia, without mention of fluctuations: G20.A1

## 2018-07-17 LAB — BASIC METABOLIC PANEL
Anion gap: 10 (ref 5–15)
BUN: 16 mg/dL (ref 8–23)
CO2: 27 mmol/L (ref 22–32)
Calcium: 9.6 mg/dL (ref 8.9–10.3)
Chloride: 103 mmol/L (ref 98–111)
Creatinine, Ser: 0.78 mg/dL (ref 0.44–1.00)
GFR calc Af Amer: >60 mL/min (ref >60)
GFR calc non Af Amer: >60 mL/min (ref >60)
Glucose, Bld: 81 mg/dL (ref 70–99)
Potassium: 4.3 mmol/L (ref 3.5–5.1)
Sodium: 140 mmol/L (ref 135–145)

## 2018-07-17 LAB — CBC
HCT: 49.3 % — ABNORMAL HIGH (ref 36.0–46.0)
Hemoglobin: 15.3 g/dL — ABNORMAL HIGH (ref 12.0–15.0)
MCH: 28.5 pg (ref 26.0–34.0)
MCHC: 31 g/dL (ref 30.0–36.0)
MCV: 91.8 fL (ref 80.0–100.0)
Platelets: 193 10*3/uL (ref 150–400)
RBC: 5.37 MIL/uL — ABNORMAL HIGH (ref 3.87–5.11)
RDW: 15.9 % — ABNORMAL HIGH (ref 11.5–15.5)
WBC: 8.4 10*3/uL (ref 4.0–10.5)
nRBC: 0 % (ref 0.0–0.2)

## 2018-07-17 LAB — SURGICAL PCR SCREEN
MRSA, PCR: NEGATIVE
Staphylococcus aureus: POSITIVE — AB

## 2018-07-17 NOTE — Telephone Encounter (Signed)
Per Last note: Preoperative evaluation prior to shoulder surgery-patient fell recently fracturing her shoulder by report.  This will require surgical intervention.  She does not have chest pain and her dyspnea on exertion is chronic and unchanged.  I will arrange an echocardiogram to reassess aortic stenosis preoperatively.  If it is unchanged he may  Per result note: Notes recorded by Lewayne Bunting, MD on 07/17/2018 at 7:22 AM EDT AS somewhat worse compared to previous now moderate to severe; make sure she has fuov with me 6 months [name]

## 2018-07-17 NOTE — Telephone Encounter (Signed)
   [name] Pre-operative Risk Assessment    Request for surgical clearance:  1. What type of surgery is being performed? Left Total Shoulder Replacement  2. When is this surgery scheduled? 07/22/2018   3. What type of clearance is required (medical clearance vs. Pharmacy clearance to hold med vs. Both)? Medical   4. Are there any medications that need to be held prior to surgery and how long? No    5. Practice name and name of physician performing surgery? [facility]   6. What is your office phone number [phone] ext [phone] Cindy Richmond    7.   What is your office fax number [phone]  8.   Anesthesia type (None, local, MAC, general) ? Not listed   Cindy Richmond 07/17/2018, 4:39 PM  _________________________________________________________________   (provider comments below)

## 2018-07-17 NOTE — Patient Instructions (Addendum)
Cindy Richmond  07/17/2018   Your procedure is scheduled on: 07-22-18    Report to [facility] Main  Entrance    Report to Admitting at 7:25 AM   YOU NEED TO HAVE A COVID 19 TEST ON 07-17-18. THIS TEST MUST BE DONE BEFORE SURGERY, COME TO [facility] ENTRANCE BETWEEN THE HOURS OF 900 AM AND 300 PM ON YOUR COVID TEST DATE.   Call this number if you have problems the morning of surgery [phone]    Remember: NO SOLID FOOD AFTER MIDNIGHT THE NIGHT PRIOR TO SURGERY. NOTHING BY MOUTH EXCEPT CLEAR LIQUIDS UNTIL 3 HOURS PRIOR TO SCHEDULED SURGERY. PLEASE FINISH ENSURE DRINK PER SURGEON ORDER 3 HOURS PRIOR TO SCHEDULED SURGERY TIME WHICH NEEDS TO BE COMPLETED AT 4:30 AM.   CLEAR LIQUID DIET   Foods Allowed                                                                     Foods Excluded  Coffee and tea, regular and decaf                             liquids that you cannot  Plain Jell-O in any flavor                                             see through such as: Fruit ices (not with fruit pulp)                                     milk, soups, orange juice  Iced Popsicles                                    All solid food Carbonated beverages, regular and diet                                    Cranberry, grape and apple juices Sports drinks like Gatorade Lightly seasoned clear broth or consume(fat free) Sugar, honey syrup  Sample Menu Breakfast                                Lunch                                     Supper Cranberry juice                    Beef broth                            Chicken broth Jell-O  Grape juice                           Apple juice Coffee or tea                        Jell-O                                      Popsicle                                                Coffee or tea                        Coffee or  tea  _____________________________________________________________________     BRUSH YOUR TEETH MORNING OF SURGERY AND RINSE YOUR MOUTH OUT, NO CHEWING GUM CANDY OR MINTS.     Take these medicines the morning of surgery with A SIP OF WATER:Carbidopa-Levodopa (Sinemet), Levothyroxine (Synthroid), Esomeprazole (Nexium). You may also use and bring your eyedrops as needed.                                You may not have any metal on your body including hair pins and              piercings     Do not wear jewelry, make-up, lotions, powders or perfumes, deodorant              Do not wear nail polish.  Do not shave  48 hours prior to surgery.         Do not bring valuables to the hospital. [facility] IS NOT             RESPONSIBLE   FOR VALUABLES.  Contacts, dentures or bridgework may not be worn into surgery.   Special Instructions: N/A              Please read over the following fact sheets you were given: _____________________________________________________________________             [facility] - Preparing for Surgery Before surgery, you can play an important role.  Because skin is not sterile, your skin needs to be as free of germs as possible.  You can reduce the number of germs on your skin by washing with CHG (chlorahexidine gluconate) soap before surgery.  CHG is an antiseptic cleaner which kills germs and bonds with the skin to continue killing germs even after washing. Please DO NOT use if you have an allergy to CHG or antibacterial soaps.  If your skin becomes reddened/irritated stop using the CHG and inform your nurse when you arrive at Short Stay. Do not shave (including legs and underarms) for at least 48 hours prior to the first CHG shower.  You may shave your face/neck. Please follow these instructions carefully:  1.  Shower with CHG Soap the night before surgery and the  morning of Surgery.  2.  If you choose to wash your hair, wash your hair first as usual with  your  normal  shampoo.  3.  After you shampoo, rinse your hair and body  thoroughly to remove the  shampoo.                           4.  Use CHG as you would any other liquid soap.  You can apply chg directly  to the skin and wash                       Gently with a scrungie or clean washcloth.  5.  Apply the CHG Soap to your body ONLY FROM THE NECK DOWN.   Do not use on face/ open                           Wound or open sores. Avoid contact with eyes, ears mouth and genitals (private parts).                       Wash face,  Genitals (private parts) with your normal soap.             6.  Wash thoroughly, paying special attention to the area where your surgery  will be performed.  7.  Thoroughly rinse your body with warm water from the neck down.  8.  DO NOT shower/wash with your normal soap after using and rinsing off  the CHG Soap.                9.  Pat yourself dry with a clean towel.            10.  Wear clean pajamas.            11.  Place clean sheets on your bed the night of your first shower and do not  sleep with pets. Day of Surgery : Do not apply any lotions/deodorants the morning of surgery.  Please wear clean clothes to the hospital/surgery center.  FAILURE TO FOLLOW THESE INSTRUCTIONS MAY RESULT IN THE CANCELLATION OF YOUR SURGERY PATIENT SIGNATURE_________________________________  NURSE SIGNATURE__________________________________

## 2018-07-17 NOTE — Telephone Encounter (Signed)
New Message             Shanda Bumps @ [facility] is calling to see if the patient can  Move forward with surgery on the 27th based on updated Echo.Pls call Shanda Bumps @  [phone]

## 2018-07-17 NOTE — Progress Notes (Signed)
07-17-18 [facility]) Neurology Clearance   07-14-18 ([facility]) ECHO

## 2018-07-18 LAB — NOVEL CORONAVIRUS, NAA (HOSP ORDER, SEND-OUT TO REF LAB; TAT 18-24 HRS): SARS-CoV-2, NAA: NOT DETECTED

## 2018-07-18 NOTE — Telephone Encounter (Signed)
Ok for surgery Cindy Richmond  

## 2018-07-21 ENCOUNTER — Telehealth: Payer: Self-pay | Admitting: Cardiology

## 2018-07-21 NOTE — Anesthesia Preprocedure Evaluation (Addendum)
Anesthesia Evaluation  Patient identified by MRN, date of birth, ID band Patient awake    Reviewed: Allergy & Precautions, H&P , NPO status , Patient's Chart, lab work & pertinent test results  Airway Mallampati: II   Neck ROM: full    Dental   Pulmonary shortness of breath, sleep apnea ,    breath sounds clear to auscultation       Cardiovascular hypertension, + Valvular Problems/Murmurs AS  Rhythm:regular Rate:Normal  Moderate-severe AS.  AVA 1.1 cm2   Neuro/Psych    GI/Hepatic hiatal hernia, GERD  ,  Endo/Other  Hypothyroidism   Renal/GU stones     Musculoskeletal   Abdominal   Peds  Hematology   Anesthesia Other Findings   Reproductive/Obstetrics                            Anesthesia Physical Anesthesia Plan  ASA: III  Anesthesia Plan: General   Post-op Pain Management:    Induction: Intravenous  PONV Risk Score and Plan: 3 and Ondansetron, Dexamethasone, Midazolam and Treatment may vary due to age or medical condition  Airway Management Planned: Oral ETT  Additional Equipment: Arterial line  Intra-op Plan:   Post-operative Plan: Extubation in OR  Informed Consent: I have reviewed the patients History and Physical, chart, labs and discussed the procedure including the risks, benefits and alternatives for the proposed anesthesia with the patient or authorized representative who has indicated his/her understanding and acceptance.       Plan Discussed with: CRNA, Anesthesiologist and Surgeon  Anesthesia Plan Comments: (See PAT note 07/17/2018, Jodell Cipro, PA-C)       Anesthesia Quick Evaluation

## 2018-07-21 NOTE — Telephone Encounter (Signed)
Clearance done.. Dr. Darel Hong spoke with Dr. Austin Miles office this morning.

## 2018-07-21 NOTE — Telephone Encounter (Signed)
Spoke with jessica, Aware of dr crenshaw's recommendations.  

## 2018-07-21 NOTE — Progress Notes (Signed)
Anesthesia Chart Review   Case:  [id number] Date/Time:  07/22/18 0943   Procedure:  REVERSE SHOULDER ARTHROPLASTY (Left )   Anesthesia type:  Choice   Pre-op diagnosis:  LT PROXIMAL HUMERUS FRACTURE   Location:  [facility] ROOM 06 / [facility]   Surgeon:  Bjorn PippinVarkey, Dax T, MD      DISCUSSION:68 yo never smoker with h/o hypothyroidism, HTN, AS, HLD, GERD, Parkinsonism, left proximal humerus fracture scheduled for above procedure 07/22/18 with Dr. Ramond Marrowax Varkey.    Pt last seen by neurologist, Dr. Lurena Joinerebecca Tat, 07/16/2018 via telemedicine.  Per her note, "From my standpoint, the benefits to surgery in her to clearly outweigh the risks, given that she needs the surgery to fix her humeral neck fracture.  She is as optimized from a Parkinson's standpoint as she is going to be right now.  Following surgery, she may need some physical therapy."  Pt last seen by cardiologist, Dr. Olga MillersBrian Crenshaw, 07/14/2018 via telemedicine.  Per his note, "Preoperative evaluation prior to shoulder surgery-patient fell recently fracturing her shoulder by report.  This will require surgical intervention.  She does not have chest pain and her dyspnea on exertion is chronic and unchanged.  I will arrange an echocardiogram to reassess aortic stenosis preoperatively.  If it is unchanged he may proceed."  Echo with Peak and mean gradients through the valve 63 and 37 mm Hg consistent with moderate to severe AS. Compared to previous echo report, gradients are significanlty increased.  Per Dr. Olga MillersBrian Crenshaw, "AS somewhat worse compared to previous now moderate to severe; make sure she has fuov with me 6 months. Ok for surgery"  VS: BP 140/75   Pulse 64   Temp 37.1 C (Oral)   Resp 20   Ht 5\' 6"  (1.676 m)   Wt 131.5 kg   SpO2 98%   BMI 46.81 kg/m   PROVIDERS: Mila PalmerWolters, Sharon, MD is PCP   Olga Millersrenshaw, Brian, MD is Cardiologist   Tat, Lurena Joinerebecca, DO is Neurologist  LABS: Labs reviewed: Acceptable for surgery. (all labs ordered are listed, but  only abnormal results are displayed)  Labs Reviewed  SURGICAL PCR SCREEN - Abnormal; Notable for the following components:      Result Value   Staphylococcus aureus POSITIVE (*)    All other components within normal limits  CBC - Abnormal; Notable for the following components:   RBC 5.37 (*)    Hemoglobin 15.3 (*)    HCT 49.3 (*)    RDW 15.9 (*)    All other components within normal limits  BASIC METABOLIC PANEL     IMAGES:   EKG: 07/17/2018 Rate 66 bpm Normal sinus rhythm  RSR' or QR pattern in V1 suggests right ventricular conduction delay Left ventricular hypertrophy No significant change since last tracing   CV: Echo 07/16/2018 IMPRESSIONS    1. The left ventricle has hyperdynamic systolic function, with an ejection fraction of >65%. The cavity size was normal. There is mildly increased left ventricular wall thickness. Left ventricular diastolic Doppler parameters are indeterminate.  2. The right ventricle has normal systolic function. The cavity was normal. There is no increase in right ventricular wall thickness.  3. The mitral valve is abnormal. Mild thickening of the mitral valve leaflet.  4. The aortic valve is abnormal. Severely thickening of the aortic valve. Severe calcifcation of the aortic valve. Moderate stenosis of the aortic valve.  5. AV is thickened, calcified with restricted motion Peak and mean gradients through the valve are 63  and 37 mm Hg consistent with moderate to severe AS. Compared to previous echo report, gradients are significanlty increased.  6. The interatrial septum was not assessed. Past Medical History:  Diagnosis Date  . Aortic stenosis   . GERD (gastroesophageal reflux disease)   . H/O hiatal hernia   . Heart murmur    asymptomatic  . Hematuria   . HOH (hard of hearing)    sightly, bilaterally  . Hyperlipidemia   . Hypertension   . Hypothyroidism   . Kidney stone on left side   . Parkinson disease [facility])     Past Surgical  History:  Procedure Laterality Date  . ABDOMINAL HYSTERECTOMY     lso  . CYSTOSCOPY W/ RETROGRADES  02/28/2012   Procedure: CYSTOSCOPY WITH RETROGRADE PYELOGRAM;  Surgeon: Sebastian Ache, MD;  Location: [facility];  Service: Urology;  Laterality: Right;  . CYSTOSCOPY WITH STENT PLACEMENT  02/28/2012   Procedure: CYSTOSCOPY WITH STENT PLACEMENT;  Surgeon: Sebastian Ache, MD;  Location: [facility];  Service: Urology;  Laterality: Left;  . CYSTOSCOPY/RETROGRADE/URETEROSCOPY/STONE EXTRACTION WITH BASKET  02/28/2012   Procedure: CYSTOSCOPY/RETROGRADE/URETEROSCOPY/STONE EXTRACTION WITH BASKET;  Surgeon: Sebastian Ache, MD;  Location[facility];  Service: Urology;  Laterality: Left;  . HOLMIUM LASER APPLICATION  02/28/2012   Procedure: HOLMIUM LASER APPLICATION;  Surgeon: Sebastian Ache, MD;  Location: [facility];  Service: Urology;  Laterality: Left;  . KNEE ARTHROSCOPY     right  . TONSILLECTOMY    . VEIN LIGATION      MEDICATIONS: . acetaminophen (TYLENOL) 500 MG tablet  . bisoprolol-hydrochlorothiazide (ZIAC) 5-6.25 MG per tablet  . carbidopa-levodopa (SINEMET IR) 25-100 MG tablet  . esomeprazole (NEXIUM) 20 MG capsule  . levothyroxine (SYNTHROID, LEVOTHROID) 75 MCG tablet  . Liniments (SALONPAS PAIN RELIEF PATCH EX)  . Polyethyl Glycol-Propyl Glycol (SYSTANE OP)   No current facility-administered medications for this encounter.     Janey Genta [facility] Pre-Surgical Testing [phone] 07/21/18 11:16 AM

## 2018-07-21 NOTE — Progress Notes (Addendum)
Left phone message for patient to call back if she has any symptoms of covid 19 1632: Spoke with patient via telephone   SCREENING SYMPTOMS OF COVID 19:   COUGH--no  RUNNY NOSE--- no  SORE THROAT---no  NASAL CONGESTION----no  SNEEZING----no  SHORTNESS OF BREATH---no  DIFFICULTY BREATHING---no TEMP >100.0 -----no  UNEXPLAINED BODY ACHES------no  CHILLS -------- no  HEADACHES ---------no  LOSS OF SMELL/ TASTE --------no    HAVE YOU OR ANY FAMILY MEMBER TRAVELLED PAST 14 DAYS OUT OF THE   COUNTY---no STATE----no COUNTRY----no  HAVE YOU OR ANY FAMILY MEMBER BEEN EXPOSED TO ANYONE WITH COVID 19? no

## 2018-07-21 NOTE — Telephone Encounter (Signed)
New Message    Sherri from Dr Austin Miles office is calling and is wanting to make sure the pt is cleared for Surgery   Please call

## 2018-07-22 ENCOUNTER — Inpatient Hospital Stay (HOSPITAL_COMMUNITY): Payer: Medicare Other

## 2018-07-22 ENCOUNTER — Encounter (HOSPITAL_COMMUNITY): Payer: Self-pay | Admitting: Anesthesiology

## 2018-07-22 ENCOUNTER — Inpatient Hospital Stay (HOSPITAL_COMMUNITY)
Admission: RE | Admit: 2018-07-22 | Discharge: 2018-07-23 | DRG: 483 | Disposition: A | Discharge status: Home / Self Care | Payer: Medicare Other | Attending: Orthopaedic Surgery | Admitting: Orthopaedic Surgery

## 2018-07-22 ENCOUNTER — Inpatient Hospital Stay (HOSPITAL_COMMUNITY): Payer: Medicare Other | Admitting: Physician Assistant

## 2018-07-22 ENCOUNTER — Encounter (HOSPITAL_COMMUNITY)
Admission: RE | Disposition: A | Discharge status: Home / Self Care | Payer: Self-pay | Source: Home / Self Care | Attending: Orthopaedic Surgery

## 2018-07-22 ENCOUNTER — Inpatient Hospital Stay (HOSPITAL_COMMUNITY): Payer: Medicare Other | Admitting: Anesthesiology

## 2018-07-22 ENCOUNTER — Other Ambulatory Visit: Payer: Self-pay

## 2018-07-22 DIAGNOSIS — I10 Essential (primary) hypertension: Secondary | ICD-10-CM | POA: Diagnosis not present

## 2018-07-22 DIAGNOSIS — M659 Synovitis and tenosynovitis, unspecified: Secondary | ICD-10-CM | POA: Diagnosis present

## 2018-07-22 DIAGNOSIS — W19XXXA Unspecified fall, initial encounter: Secondary | ICD-10-CM | POA: Diagnosis present

## 2018-07-22 DIAGNOSIS — S42202A Unspecified fracture of upper end of left humerus, initial encounter for closed fracture: Secondary | ICD-10-CM | POA: Diagnosis present

## 2018-07-22 DIAGNOSIS — S42292A Other displaced fracture of upper end of left humerus, initial encounter for closed fracture: Principal | ICD-10-CM | POA: Diagnosis present

## 2018-07-22 DIAGNOSIS — Z6841 Body Mass Index (BMI) 40.0 and over, adult: Secondary | ICD-10-CM | POA: Diagnosis not present

## 2018-07-22 DIAGNOSIS — G4730 Sleep apnea, unspecified: Secondary | ICD-10-CM | POA: Diagnosis present

## 2018-07-22 DIAGNOSIS — Z09 Encounter for follow-up examination after completed treatment for conditions other than malignant neoplasm: Secondary | ICD-10-CM

## 2018-07-22 DIAGNOSIS — K219 Gastro-esophageal reflux disease without esophagitis: Secondary | ICD-10-CM | POA: Diagnosis present

## 2018-07-22 DIAGNOSIS — G20 Parkinson's disease: Secondary | ICD-10-CM | POA: Diagnosis present

## 2018-07-22 DIAGNOSIS — Z8249 Family history of ischemic heart disease and other diseases of the circulatory system: Secondary | ICD-10-CM

## 2018-07-22 DIAGNOSIS — Z419 Encounter for procedure for purposes other than remedying health state, unspecified: Secondary | ICD-10-CM

## 2018-07-22 DIAGNOSIS — E039 Hypothyroidism, unspecified: Secondary | ICD-10-CM | POA: Diagnosis not present

## 2018-07-22 HISTORY — PX: REVERSE SHOULDER ARTHROPLASTY: SHX5054

## 2018-07-22 HISTORY — DX: Dyspnea, unspecified: R06.00

## 2018-07-22 SURGERY — ARTHROPLASTY, SHOULDER, TOTAL, REVERSE
Anesthesia: General | Laterality: Left

## 2018-07-22 MED ORDER — ONDANSETRON HCL 4 MG/2ML IJ SOLN: 4.0000 mg | Freq: Four times a day (QID) | INTRAMUSCULAR | Status: DC | PRN

## 2018-07-22 MED ORDER — VANCOMYCIN HCL 1000 MG IV SOLR: 1000.0000 mg | Freq: Once | INTRAVENOUS | Status: DC

## 2018-07-22 MED ORDER — METOCLOPRAMIDE HCL 5 MG/ML IJ SOLN: 5.0000 mg | Freq: Three times a day (TID) | INTRAMUSCULAR | Status: DC | PRN

## 2018-07-22 MED ORDER — DEXAMETHASONE SODIUM PHOSPHATE 10 MG/ML IJ SOLN
INTRAMUSCULAR | Status: DC | PRN
  Administered 2018-07-22: 6 mg via INTRAVENOUS

## 2018-07-22 MED ORDER — PANTOPRAZOLE SODIUM 40 MG PO TBEC
40.0000 mg | DELAYED_RELEASE_TABLET | Freq: Every day | ORAL | Status: DC
  Administered 2018-07-23: 09:00:00 40 mg via ORAL
  Filled 2018-07-22: qty 1

## 2018-07-22 MED ORDER — PROPOFOL 10 MG/ML IV BOLUS
INTRAVENOUS | Status: DC | PRN
  Administered 2018-07-22: 120 mg via INTRAVENOUS

## 2018-07-22 MED ORDER — SUGAMMADEX SODIUM 500 MG/5ML IV SOLN
INTRAVENOUS | Status: DC | PRN
  Administered 2018-07-22: 300 mg via INTRAVENOUS

## 2018-07-22 MED ORDER — SODIUM CHLORIDE 0.9 % IV SOLN
INTRAVENOUS | Status: DC | PRN
  Administered 2018-07-22: 20:00:00 250 mL via INTRAVENOUS

## 2018-07-22 MED ORDER — CARBIDOPA-LEVODOPA 25-100 MG PO TABS
1.0000 | ORAL_TABLET | Freq: Four times a day (QID) | ORAL | Status: DC
  Administered 2018-07-22 – 2018-07-23 (×3): 1 via ORAL
  Filled 2018-07-22 (×3): qty 1

## 2018-07-22 MED ORDER — CELECOXIB 200 MG PO CAPS
200.0000 mg | ORAL_CAPSULE | Freq: Two times a day (BID) | ORAL | Status: DC
  Administered 2018-07-22 – 2018-07-23 (×3): 200 mg via ORAL
  Filled 2018-07-22 (×3): qty 1

## 2018-07-22 MED ORDER — FENTANYL CITRATE (PF) 100 MCG/2ML IJ SOLN
100.0000 ug | Freq: Once | INTRAMUSCULAR | Status: AC
  Administered 2018-07-22: 09:00:00 50 ug via INTRAVENOUS
  Filled 2018-07-22: qty 2

## 2018-07-22 MED ORDER — METOCLOPRAMIDE HCL 5 MG PO TABS: 5.0000 mg | ORAL_TABLET | Freq: Three times a day (TID) | ORAL | Status: DC | PRN

## 2018-07-22 MED ORDER — ONDANSETRON HCL 4 MG PO TABS: 4.0000 mg | ORAL_TABLET | Freq: Four times a day (QID) | ORAL | Status: DC | PRN

## 2018-07-22 MED ORDER — LACTATED RINGERS IV SOLN
INTRAVENOUS | Status: DC
  Administered 2018-07-22: 08:00:00 via INTRAVENOUS

## 2018-07-22 MED ORDER — DOCUSATE SODIUM 100 MG PO CAPS
100.0000 mg | ORAL_CAPSULE | Freq: Two times a day (BID) | ORAL | Status: DC
  Administered 2018-07-22 – 2018-07-23 (×2): 100 mg via ORAL
  Filled 2018-07-22 (×2): qty 1

## 2018-07-22 MED ORDER — VANCOMYCIN HCL 1000 MG IV SOLR
1000.0000 mg | Freq: Once | INTRAVENOUS | Status: AC
  Administered 2018-07-22: 21:00:00 1000 mg via INTRAVENOUS
  Filled 2018-07-22 (×2): qty 1000

## 2018-07-22 MED ORDER — FENTANYL CITRATE (PF) 100 MCG/2ML IJ SOLN: 25.0000 ug | INTRAMUSCULAR | Status: DC | PRN

## 2018-07-22 MED ORDER — VANCOMYCIN HCL 1000 MG IV SOLR
INTRAVENOUS | Status: AC
  Filled 2018-07-22: qty 1000

## 2018-07-22 MED ORDER — VANCOMYCIN HCL POWD
Status: DC | PRN
  Administered 2018-07-22: 11:00:00 1000 mg via TOPICAL

## 2018-07-22 MED ORDER — VANCOMYCIN HCL IN DEXTROSE 1-5 GM/200ML-% IV SOLN
1000.0000 mg | Freq: Two times a day (BID) | INTRAVENOUS | Status: DC
  Filled 2018-07-22: qty 200

## 2018-07-22 MED ORDER — SODIUM CHLORIDE 0.9 % IR SOLN
Status: DC | PRN
  Administered 2018-07-22 (×3): 1000 mL

## 2018-07-22 MED ORDER — STERILE WATER FOR IRRIGATION IR SOLN
Status: DC | PRN
  Administered 2018-07-22: 2000 mL

## 2018-07-22 MED ORDER — OXYCODONE HCL 5 MG PO TABS
5.0000 mg | ORAL_TABLET | ORAL | Status: DC | PRN
  Administered 2018-07-22 – 2018-07-23 (×2): 5 mg via ORAL
  Filled 2018-07-22 (×2): qty 1

## 2018-07-22 MED ORDER — FENTANYL CITRATE (PF) 100 MCG/2ML IJ SOLN
INTRAMUSCULAR | Status: AC
  Filled 2018-07-22: qty 2

## 2018-07-22 MED ORDER — LIDOCAINE 2% (20 MG/ML) 5 ML SYRINGE
INTRAMUSCULAR | Status: DC | PRN
  Administered 2018-07-22: 60 mg via INTRAVENOUS

## 2018-07-22 MED ORDER — DIPHENHYDRAMINE HCL 12.5 MG/5ML PO ELIX: 12.5000 mg | ORAL_SOLUTION | ORAL | Status: DC | PRN

## 2018-07-22 MED ORDER — OXYCODONE HCL 5 MG PO TABS: 5.0000 mg | ORAL_TABLET | Freq: Once | ORAL | Status: DC | PRN

## 2018-07-22 MED ORDER — BUPIVACAINE LIPOSOME 1.3 % IJ SUSP
INTRAMUSCULAR | Status: DC | PRN
  Administered 2018-07-22: 10 mL via PERINEURAL

## 2018-07-22 MED ORDER — TRANEXAMIC ACID-NACL 1000-0.7 MG/100ML-% IV SOLN
1000.0000 mg | INTRAVENOUS | Status: AC
  Administered 2018-07-22: 1000 mg via INTRAVENOUS
  Filled 2018-07-22: qty 100

## 2018-07-22 MED ORDER — LEVOTHYROXINE SODIUM 75 MCG PO TABS
75.0000 ug | ORAL_TABLET | Freq: Every day | ORAL | Status: DC
  Administered 2018-07-23: 06:00:00 75 ug via ORAL
  Filled 2018-07-22: qty 1

## 2018-07-22 MED ORDER — VANCOMYCIN HCL 10 G IV SOLR
1500.0000 mg | INTRAVENOUS | Status: AC
  Administered 2018-07-22: 08:00:00 1500 mg via INTRAVENOUS
  Filled 2018-07-22: qty 1500

## 2018-07-22 MED ORDER — ONDANSETRON HCL 4 MG/2ML IJ SOLN
INTRAMUSCULAR | Status: DC | PRN
  Administered 2018-07-22: 4 mg via INTRAVENOUS

## 2018-07-22 MED ORDER — FENTANYL CITRATE (PF) 100 MCG/2ML IJ SOLN
INTRAMUSCULAR | Status: DC | PRN
  Administered 2018-07-22 (×2): 50 ug via INTRAVENOUS

## 2018-07-22 MED ORDER — MIDAZOLAM HCL 2 MG/2ML IJ SOLN
2.0000 mg | Freq: Once | INTRAMUSCULAR | Status: AC
  Administered 2018-07-22: 09:00:00 1 mg via INTRAVENOUS
  Filled 2018-07-22: qty 2

## 2018-07-22 MED ORDER — SODIUM CHLORIDE 0.9 % IV SOLN
INTRAVENOUS | Status: DC | PRN
  Administered 2018-07-22: 25 ug/min via INTRAVENOUS

## 2018-07-22 MED ORDER — PROPOFOL 10 MG/ML IV BOLUS
INTRAVENOUS | Status: AC
  Filled 2018-07-22: qty 40

## 2018-07-22 MED ORDER — METOPROLOL TARTRATE 5 MG/5ML IV SOLN
INTRAVENOUS | Status: DC | PRN
  Administered 2018-07-22 (×2): 1 mg via INTRAVENOUS

## 2018-07-22 MED ORDER — HYDROMORPHONE HCL 1 MG/ML IJ SOLN: 0.5000 mg | INTRAMUSCULAR | Status: DC | PRN

## 2018-07-22 MED ORDER — ZOLPIDEM TARTRATE 5 MG PO TABS: 5.0000 mg | ORAL_TABLET | Freq: Every evening | ORAL | Status: DC | PRN

## 2018-07-22 MED ORDER — OXYCODONE HCL 5 MG/5ML PO SOLN: 5.0000 mg | Freq: Once | ORAL | Status: DC | PRN

## 2018-07-22 MED ORDER — CHLORHEXIDINE GLUCONATE 4 % EX LIQD: 60.0000 mL | Freq: Once | CUTANEOUS | Status: DC

## 2018-07-22 MED ORDER — BUPIVACAINE-EPINEPHRINE (PF) 0.5% -1:200000 IJ SOLN
INTRAMUSCULAR | Status: DC | PRN
  Administered 2018-07-22: 15 mL via PERINEURAL

## 2018-07-22 MED ORDER — SUCCINYLCHOLINE CHLORIDE 200 MG/10ML IV SOSY
PREFILLED_SYRINGE | INTRAVENOUS | Status: DC | PRN
  Administered 2018-07-22: 120 mg via INTRAVENOUS

## 2018-07-22 MED ORDER — BISOPROLOL-HYDROCHLOROTHIAZIDE 5-6.25 MG PO TABS
1.0000 | ORAL_TABLET | Freq: Every day | ORAL | Status: DC
  Administered 2018-07-22 – 2018-07-23 (×2): 1 via ORAL
  Filled 2018-07-22 (×2): qty 1

## 2018-07-22 MED ORDER — ACETAMINOPHEN 500 MG PO TABS
1000.0000 mg | ORAL_TABLET | Freq: Three times a day (TID) | ORAL | Status: DC
  Administered 2018-07-22 – 2018-07-23 (×3): 1000 mg via ORAL
  Filled 2018-07-22 (×3): qty 2

## 2018-07-22 MED ORDER — BUPIVACAINE-EPINEPHRINE (PF) 0.25% -1:200000 IJ SOLN
INTRAMUSCULAR | Status: AC
  Filled 2018-07-22: qty 30

## 2018-07-22 MED ORDER — ROCURONIUM BROMIDE 10 MG/ML (PF) SYRINGE
PREFILLED_SYRINGE | INTRAVENOUS | Status: DC | PRN
  Administered 2018-07-22 (×2): 10 mg via INTRAVENOUS
  Administered 2018-07-22: 60 mg via INTRAVENOUS
  Administered 2018-07-22 (×2): 10 mg via INTRAVENOUS

## 2018-07-22 MED FILL — Vancomycin HCl For IV Soln 1 GM (Base Equivalent): INTRAVENOUS | Qty: 1000 | Status: AC

## 2018-07-22 SURGICAL SUPPLY — 63 items
BASEPLATE GLENOSPHERE 25 STD (Miscellaneous) ×2 IMPLANT
BIT DRILL 3.2 PERIPHERAL SCREW (BIT) ×2 IMPLANT
BLADE SAW SAG 73X25 THK (BLADE) ×1
BLADE SAW SGTL 73X25 THK (BLADE) ×1 IMPLANT
BODY PROXIMAL PTC 11 132.5D (Spacer) ×1 IMPLANT
CAP LOCKING COCR (Cap) ×2 IMPLANT
CLSR STERI-STRIP ANTIMIC 1/2X4 (GAUZE/BANDAGES/DRESSINGS) ×2 IMPLANT
COVER SURGICAL LIGHT HANDLE (MISCELLANEOUS) ×2 IMPLANT
COVER WAND RF STERILE (DRAPES) ×2 IMPLANT
DRAPE INCISE IOBAN 66X45 STRL (DRAPES) ×4 IMPLANT
DRAPE ORTHO SPLIT 77X108 STRL (DRAPES) ×2
DRAPE SHEET LG 3/4 BI-LAMINATE (DRAPES) ×2 IMPLANT
DRAPE SURG ORHT 6 SPLT 77X108 (DRAPES) ×2 IMPLANT
DRSG AQUACEL AG ADV 3.5X 6 (GAUZE/BANDAGES/DRESSINGS) ×2 IMPLANT
DURAPREP 26ML APPLICATOR (WOUND CARE) ×2 IMPLANT
ELECT BLADE TIP CTD 4 INCH (ELECTRODE) ×2 IMPLANT
ELECT REM PT RETURN 15FT ADLT (MISCELLANEOUS) ×2 IMPLANT
GLENOSPHERE (Orthopedic Implant) ×2 IMPLANT
GLENOSPHERE REV SHOULDER 36 (Joint) ×2 IMPLANT
GLOVE BIO SURGEON STRL SZ8 (GLOVE) ×2 IMPLANT
GLOVE BIOGEL PI IND STRL 8 (GLOVE) ×2 IMPLANT
GLOVE BIOGEL PI INDICATOR 8 (GLOVE) ×2
GLOVE ECLIPSE 8.0 STRL XLNG CF (GLOVE) ×4 IMPLANT
GOWN SPEC L3 XXLG W/TWL (GOWN DISPOSABLE) ×2 IMPLANT
GOWN STRL REUS W/ TWL XL LVL3 (GOWN DISPOSABLE) ×1 IMPLANT
GOWN STRL REUS W/TWL XL LVL3 (GOWN DISPOSABLE) ×1
GUIDEWIRE GLENOID 2.5X220 (WIRE) ×2 IMPLANT
HANDPIECE INTERPULSE COAX TIP (DISPOSABLE) ×1
IMPL REVERSE SHOULDER 0X3.5 (Shoulder) ×1 IMPLANT
IMPLANT REVERSE SHOULDER 0X3.5 (Shoulder) ×2 IMPLANT
INSERT HUMERAL 36X6MM 12.5DEG (Insert) ×2 IMPLANT
KIT BASIN OR (CUSTOM PROCEDURE TRAY) ×2 IMPLANT
KIT STABILIZATION SHOULDER (MISCELLANEOUS) ×2 IMPLANT
KIT TURNOVER KIT A (KITS) ×2 IMPLANT
MANIFOLD NEPTUNE II (INSTRUMENTS) ×2 IMPLANT
NS IRRIG 1000ML POUR BTL (IV SOLUTION) ×2 IMPLANT
PACK SHOULDER (CUSTOM PROCEDURE TRAY) ×2 IMPLANT
PROXIMAL BODY PTC 11 132.5D (Spacer) ×2 IMPLANT
PTC PARTIALLY COATED DISTAL STEM (Orthopedic Implant) ×2 IMPLANT
RESTRAINT HEAD UNIVERSAL NS (MISCELLANEOUS) ×2 IMPLANT
REVERSED INSERT (Orthopedic Implant) ×2 IMPLANT
SCREW (Orthopedic Implant) ×4 IMPLANT
SCREW ASSEMBLY COCR TYPE 0 (Screw) ×2 IMPLANT
SCREW BONE 6.5X40 SM (Screw) ×2 IMPLANT
SCREW PERIPHERAL 30 (Screw) ×2 IMPLANT
SCREW PERIPHERAL 5.0X34 (Screw) ×2 IMPLANT
SET HNDPC FAN SPRY TIP SCT (DISPOSABLE) ×1 IMPLANT
SLING ULTRA III MED (ORTHOPEDIC SUPPLIES) ×4 IMPLANT
STANDARD BASEPLATE (Orthopedic Implant) ×2 IMPLANT
STEM PRTL DISTAL 11 SHOULDER (Miscellaneous) ×2 IMPLANT
SUCTION FRAZIER HANDLE 10FR (MISCELLANEOUS) ×1
SUCTION TUBE FRAZIER 10FR DISP (MISCELLANEOUS) ×1 IMPLANT
SUT ETHIBOND 2 V 37 (SUTURE) ×2 IMPLANT
SUT ETHIBOND NAB CT1 #1 30IN (SUTURE) ×2 IMPLANT
SUT FIBERWIRE #5 38 CONV NDL (SUTURE) ×12
SUT MNCRL AB 3-0 PS2 18 (SUTURE) ×4 IMPLANT
SUT VIC AB 0 CT1 36 (SUTURE) ×4 IMPLANT
SUT VIC AB 2-0 CT1 27 (SUTURE) ×1
SUT VIC AB 2-0 CT1 TAPERPNT 27 (SUTURE) ×1 IMPLANT
SUTURE FIBERWR #5 38 CONV NDL (SUTURE) ×6 IMPLANT
TOWEL OR 17X26 10 PK STRL BLUE (TOWEL DISPOSABLE) ×2 IMPLANT
WATER STERILE IRR 1000ML POUR (IV SOLUTION) ×4 IMPLANT
YANKAUER SUCT BULB TIP NO VENT (SUCTIONS) ×2 IMPLANT

## 2018-07-22 NOTE — Anesthesia Procedure Notes (Signed)
Anesthesia Regional Block: Interscalene brachial plexus block   Pre-Anesthetic Checklist: ,, timeout performed, Correct Patient, Correct Site, Correct Laterality, Correct Procedure, Correct Position, site marked, Risks and benefits discussed,  Surgical consent,  Pre-op evaluation,  At surgeon's request and post-op pain management  Laterality: Left  Prep: chloraprep       Needles:  Injection technique: Single-shot  Needle Type: Echogenic Stimulator Needle     Needle Length: 5cm  Needle Gauge: 22     Additional Needles:   Procedures:, nerve stimulator,,,,,,,   Nerve Stimulator or Paresthesia:  Response: biceps flexion, 0.45 mA,   Additional Responses:   Narrative:  Start time: 07/22/2018 8:46 AM End time: 07/22/2018 8:52 AM Injection made incrementally with aspirations every 5 mL.  Performed by: Personally  Anesthesiologist: Achille Rich, MD  Additional Notes: Functioning IV was confirmed and monitors were applied.  A 4mm 22ga Arrow echogenic stimulator needle was used. Sterile prep and drape,hand hygiene and sterile gloves were used.  Negative aspiration and negative test dose prior to incremental administration of local anesthetic. The patient tolerated the procedure well.  Ultrasound guidance: relevent anatomy identified, needle position confirmed, local anesthetic spread visualized around nerve(s), vascular puncture avoided.  Image printed for medical record.

## 2018-07-22 NOTE — Progress Notes (Signed)
Assisted Dr. Hodierne with left, ultrasound guided, interscalene  block. Side rails up, monitors on throughout procedure. See vital signs in flow sheet. Tolerated Procedure well. 

## 2018-07-22 NOTE — Anesthesia Procedure Notes (Signed)
Arterial Line Insertion Start/End5/27/2020 9:00 AM, 07/22/2018 9:19 AM Performed by: Theodosia Quay, CRNA, CRNA  Patient location: Pre-op. Preanesthetic checklist: patient identified, IV checked, site marked, risks and benefits discussed, surgical consent, monitors and equipment checked, pre-op evaluation, timeout performed and anesthesia consent Lidocaine 1% used for infiltration Right, radial was placed Catheter size: 20 Fr Hand hygiene performed  and maximum sterile barriers used   Attempts: 1 Procedure performed without using ultrasound guided technique. Following insertion, dressing applied and Biopatch. Post procedure assessment: normal and unchanged

## 2018-07-22 NOTE — Transfer of Care (Signed)
Immediate Anesthesia Transfer of Care Note  Patient: Cindy Richmond  Procedure(s) Performed: Procedure(s): REVERSE SHOULDER ARTHROPLASTY (Left)  Patient Location: PACU  Anesthesia Type:GA combined with regional for post-op pain  Level of Consciousness:  sedated, patient cooperative and responds to stimulation  Airway & Oxygen Therapy:Patient Spontanous Breathing and Patient connected to face mask oxgen  Post-op Assessment:  Report given to PACU RN and Post -op Vital signs reviewed and stable  Post vital signs:  Reviewed and stable  Last Vitals:  Vitals:   07/22/18 0912 07/22/18 0915  BP: 127/76   Pulse: 75 78  Resp: (!) 21 19  Temp:    SpO2: 97% 97%    Complications: No apparent anesthesia complications

## 2018-07-22 NOTE — H&P (Signed)
PREOPERATIVE H&P  Chief Complaint: LT PROXIMAL HUMERUS FRACTURE  HPI: Cindy Richmond is a 68 y.o. female who presents for preoperative history and physical with a diagnosis of LT PROXIMAL HUMERUS FRACTURE. Symptoms are rated as moderate to severe, and have been worsening.  This is significantly impairing activities of daily living.  Please see my clinic note for full details on this patient's care.  She has elected for surgical management.   Past Medical History:  Diagnosis Date  . Aortic stenosis   . Dyspnea    with head low, sleep apnea  . GERD (gastroesophageal reflux disease)   . H/O hiatal hernia   . Heart murmur    asymptomatic  . Hematuria   . HOH (hard of hearing)    sightly, bilaterally  . Hyperlipidemia   . Hypertension   . Hypothyroidism   . Kidney stone on left side   . Parkinson disease [facility](H[facility])    Past Surgical History:  Procedure Laterality Date  . ABDOMINAL HYSTERECTOMY     lso  . CYSTOSCOPY W/ RETROGRADES  02/28/2012   Procedure: CYSTOSCOPY WITH RETROGRADE PYELOGRAM;  Surgeon: Sebastian Acheheodore Manny, MD;  [facility];  Service: Urology;  Laterality: Right;  . CYSTOSCOPY WITH STENT PLACEMENT  02/28/2012   Procedure: CYSTOSCOPY WITH STENT PLACEMENT;  Surgeon: Sebastian Acheheodore Manny, MD;  [facility] [place];  Service: Urology;  Laterality: Left;  . CYSTOSCOPY/RETROGRADE/URETEROSCOPY/STONE EXTRACTION WITH BASKET  02/28/2012   Procedure: CYSTOSCOPY/RETROGRADE/URETEROSCOPY/STONE EXTRACTION WITH BASKET;  Surgeon: Sebastian Acheheodore Manny, MD;  [facility] ;  Service: Urology;  Laterality: Left;  . HOLMIUM LASER APPLICATION  02/28/2012   Procedure: HOLMIUM LASER APPLICATION;  Surgeon: Sebastian Acheheodore Manny, MD;  [facility] ;  Service: Urology;  Laterality: Left;  . KNEE ARTHROSCOPY     right  . TONSILLECTOMY    . VEIN LIGATION     Social History   Socioeconomic History  . Marital status: Widowed    Spouse name: Not  on file  . Number of children: 3  . Years of education: Not on file  . Highest education level: Not on file  Occupational History  . 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 Financial resource strain: Not on file  . Food insecurity:    Worry: Not on file    Inability: Not on file  . Transportation needs:    Medical: Not on file    Non-medical: Not on file  Tobacco Use  . Smoking status: Never Smoker  . Smokeless tobacco: Never Used  Substance and Sexual Activity  . Alcohol use: Never    Frequency: Never  . Drug use: Never  . Sexual activity: Not on file  Lifestyle  . Physical activity:    Days per week: Not on file    Minutes per session: Not on file  . Stress: Not on file  Relationships  . Social connections:    Talks on phone: Not on file    Gets together: Not on file    Attends religious service: Not on file    Active member of club or organization: Not on file    Attends meetings of clubs or organizations: Not on file    Relationship status: Not on file  Other Topics Concern  . Not on file  Social History Narrative  . Not on file   Family History  Problem Relation Age of Onset  . CAD Mother   . Heart failure  Father   . Healthy Sister   . Healthy Son    Allergies  Allergen Reactions  . Penicillins Other (See Comments)    unknown Did it involve swelling of the face/tongue/throat, SOB, or low BP? Unknown Did it involve sudden or severe rash/hives, skin peeling, or any reaction on the inside of your mouth or nose? Unknown Did you need to seek medical attention at a hospital or doctor's office? Unknown When did it last happen?newborn allergy If all above answers are "NO", may proceed with cephalosporin use.    Prior to Admission medications   Medication Sig Start Date End Date Taking? Authorizing Provider  acetaminophen (TYLENOL) 500 MG tablet Take 1,000 mg by mouth every 4 (four) hours as needed for moderate pain or headache.    Yes  [provider]  bisoprolol-hydrochlorothiazide (ZIAC) 5-6.25 MG per tablet Take 1 tablet by mouth daily.   Yes [provider]  carbidopa-levodopa (SINEMET IR) 25-100 MG tablet Take 1 tablet by mouth 4 (four) times daily.   Yes [provider]  esomeprazole (NEXIUM) 20 MG capsule Take 20 mg by mouth daily.    Yes [provider]  levothyroxine (SYNTHROID, LEVOTHROID) 75 MCG tablet Take 75 mcg by mouth daily.   Yes [provider]  Polyethyl Glycol-Propyl Glycol (SYSTANE OP) Place 1 drop into both eyes daily as needed (dry eyes).   Yes [provider]  Liniments (SALONPAS PAIN RELIEF PATCH EX) Apply 1 patch topically daily as needed (pain).    [provider]     Positive ROS: All other systems have been reviewed and were otherwise negative with the exception of those mentioned in the HPI and as above.  Physical Exam: General: Alert, no acute distress Cardiovascular: No pedal edema Respiratory: No cyanosis, no use of accessory musculature GI: No organomegaly, abdomen is soft and non-tender Skin: No lesions in the area of chief complaint Neurologic: Sensation intact distally Psychiatric: Patient is competent for consent with normal mood and affect Lymphatic: No axillary or cervical lymphadenopathy  MUSCULOSKELETAL: L shoulder scattered ecchymosis, questionable axillary motor on exam today though limited by pain, wwp hand.    Assessment: LT PROXIMAL HUMERUS FRACTURE  Plan: Plan for Procedure(s): REVERSE SHOULDER ARTHROPLASTY  The risks benefits and alternatives were discussed with the patient including but not limited to the risks of nonoperative treatment, versus surgical intervention including infection, bleeding, nerve injury,  blood clots, cardiopulmonary complications, morbidity, mortality, among others, and they were willing to proceed.   We additionally specifically discussed risks of axillary nerve injury,  infection, periprosthetic fracture, continued pain and longevity of implants prior to beginning procedure.     Bjorn Pippin, MD  07/22/2018 9:25 AM

## 2018-07-22 NOTE — Anesthesia Procedure Notes (Signed)
Procedure Name: Intubation Date/Time: 07/22/2018 9:56 AM Performed by: Theodosia Quay, CRNA Pre-anesthesia Checklist: Patient identified, Emergency Drugs available, Suction available, Patient being monitored and Timeout performed Patient Re-evaluated:Patient Re-evaluated prior to induction Oxygen Delivery Method: Circle system utilized Preoxygenation: Pre-oxygenation with 100% oxygen Induction Type: IV induction Ventilation: Mask ventilation without difficulty Grade View: Grade II Tube type: Oral Tube size: 7.0 mm Number of attempts: 1 Airway Equipment and Method: Stylet and Video-laryngoscopy Placement Confirmation: ETT inserted through vocal cords under direct vision,  positive ETCO2,  CO2 detector and breath sounds checked- equal and bilateral Secured at: 23 cm Tube secured with: Tape Dental Injury: Teeth and Oropharynx as per pre-operative assessment  Comments: ATOI.

## 2018-07-22 NOTE — Op Note (Signed)
Orthopaedic Surgery Operative Note (CSN: [id number])  [name]  Feb 20, 1951 Date of Surgery: 07/22/2018   Diagnoses:  Left head split proximal humerus fracture with comminution  Procedure: Left reverse total Shoulder Arthroplasty   Operative Finding Successful completion of planned procedure.  Head was clearly split and we tried to shrink the greater tuberosity fracture fragment significantly however he noted that after the reduction of the tuberosities with suture there was a inlay type appearance to the tuberosities rather than the typical onlay that we would have normally expected.  This was felt to be appropriate and taken on the tuberosities would have significant morbidity.  Patient had extremely poor bone quality and her proximal humeral shaft was a stovepipe type formation not amenable to a short stem.  We had great fit with the revive stem and good tension on our construct.  Implants: 25 baseplate, 36 standard sphere, Tornier revive 11 stem and a standard proximal body.  High offset tray with a +6 poly-.  Post-operative plan: The patient will be NWB in sling.  The patient will be admitted overnight.  DVT prophylaxis not indicated in isolated upper extremity surgery patient with no specific risks factors.  Pain control with PRN pain medication preferring oral medicines.  Follow up plan will be scheduled in approximately 7 days for incision check and XR.  Physical therapy to start after 4-week visit.  Post-Op Diagnosis: Same Surgeons:Primary: Hiram Gash, MD Assistants:Brandon Lynnell Jude Location: [facility] ROOM 06 Anesthesia: Choice Antibiotics: Ancef 2g preop, Vancomycin 1061m locally Tourniquet time: None Estimated Blood Loss: 1176Complications: None Specimens: None Implants: Implant Name Type Inv. Item Serial No. Manufacturer Lot No. LRB No. Used Action  STANDARD BASEPLATE Orthopedic Implant  [id number] 1 Implanted  SCREW   [id number] [employer]  Left 1  Implanted  SCREW Orthopedic Implant  [id number]  Left 1 Implanted  SCREW Orthopedic Implant  [id number] [employer]  Left 1 Implanted  GLENOSPHERE Orthopedic Implant  [id number]  Left 1 Implanted  SCREW ASSEMBLY COCR TYPE 0 - [id number] SCREW ASSEMBLY COCR TYPE 0 [id number]  Left 1 Implanted  CAP LOCKING COCR - [id number] CAP LOCKING COCR [id number]  Left 1 Implanted  PROXIMAL BODY PTC 11 132.5D - [id number] Spacer PROXIMAL BODY PTC 11 132.5D [id number] [employer]  Left 1 Implanted  PTC PARTIALLY COATED DISTAL STEM Orthopedic Implant  [id number]  Left 1 Implanted  REVERSED INSERT Orthopedic Implant  [id number]  Left 1 Implanted  REVERSED TRAY   [id number]  Left 1 Implanted    Indications for Surgery:   Cindy P RMoreeis a 68y.o. female with fall in the setting of Parkinson's and multiple other medical comorbidities including super morbid obesity.  Unfortunately CT demonstrated a significant displaced comminuted intra-articular fracture of the proximal humerus.  Benefits and risks of operative and nonoperative management were discussed prior to surgery with patient/guardian(s) and informed consent form was completed.  Infection and need for further surgery were discussed as was prosthetic stability and cuff issues.  We additionally specifically discussed risks of axillary nerve injury, infection, periprosthetic fracture, continued pain and longevity of implants prior to beginning procedure.      Procedure:   The patient was identified in the preoperative holding area where the surgical site was marked. Block placed by anesthesia with exparel.  The patient was taken to the OR where a procedural timeout was called and the  above noted anesthesia was induced.  The patient was positioned beachchair on allen table with spider arm positioner.  Preoperative antibiotics were dosed.  The patient's left  shoulder was prepped and draped in the usual sterile fashion.  A second preoperative timeout was called.       Standard deltopectoral approach was performed with a #10 blade. We dissected down to the subcutaneous tissues and the cephalic vein was taken laterally with the deltoid. Clavipectoral fascia was incised in line with the incision. Deep retractors were placed. The long of the biceps tendon was identified and there was significant tenosynovitis present.  Tenodesis was performed to the pectoralis tendon with #2 Ethibond. The remaining biceps was followed up into the rotator interval where it was released.    We used the bicipital groove as a landmark for the lesser and greater tuberosity fragments.  We were able to mobilize the lesser tuberosity fragment and placed stay sutures in the bone tendon junction to help with mobilization.  This point we were able to identify the  greater tuberosity fragment and 4 #5  FiberWire sutures were used to place into this for eventual repair of the tuberosities.  Once these were both mobilized we took care to identify the shaft fragment as well as the head fragment.  We carefully identified the head fragment were able to manually remove it.  At this point the axillary nerve was found and palpated and with a tug test noted to be intact.  Protected throughout the remainder of the case with blunt retractors.    We then released the SGHL with bovie cautery prior to placing a curved mayo at the junction of the anterior glenoid well above the axillary nerve and bluntly dissecting the subscapularis from the capsule.  We then carefully protected the axillary nerve as we gently released the inferior capsule to fully mobilize the subscapularis.  An anterior deltoid retractor was then placed as well as a small Hohmann retractor superiorly.   The glenoid was relatively preserved as we would expect in this fracture patient.  The remaining labrum was removed circumferentially  taking great care not to disrupt the posterior capsule.    The glenoid drill guide was placed and used to drill a guide pin in the center, inferior position. The glenoid face was then reamed concentrically over the guide wire. The center hole was drilled over the guidepin in a near anatomic angle of version. Next the glenoid vault was drilled back to a depth of  40 mm.  We tapped and then placed a 25 mm size baseplate with 0 lateralization was selected with a 6.5 x 70m length central screw.  The base plate was screwed into the glenoid vault obtaining secure fixation. We next placed superior and inferior locking screws for additional fixation.  Next a 36 mm glenosphere was selected and impacted onto the baseplate. The center screw was tightened.   We then repositioned the arm to give access to the humeral shaft fragment.  Drill holes were placed and fiberwire sutures in the shaft for vertical fixation of the tuberosities.    We initially attempted to broach with the flex stems however there was not appropriate fit.  We then switched to the revive implants.  We broached with the Revive stem implants  starting with a size 9 reamer and reaming up to 11 which obtained an appropriate fit.  The proximal body was sized separately and attached to trial and achieve a stable articulation.    We  trialed with multiple size tray and polyethylene options and selected a 0 high which provided good stability and range of motion without excess soft tissue tension. The offset was dialed in to match the normal anatomy. The shoulder was trialed.  There was good ROM in all planes and the shoulder was stable with no inferior translation.   We then mobilized her tuberosities again and placed the anterior deep limbs of the 4 #5 fiber wires around the stem.  1 of these was tied down fixing the greater tuberosity in place after bone graft harvest from the humeral head component was placed underneath.  A +0 high offset tray was  selected and impacted onto the stem.   A 36+6 polyethylene liner was impacted onto the stem.  The joint was reduced and thoroughly irrigated with pulsatile lavage. The remaining sutures were then placed through the subscapularis and the bone tendon junction and the tuberosities were reduced after bone graft placed beneath as autograft at the subscap.  We horizontally secured the tuberosities before placing vertical fixation with the suture that was placed into the shaft.  Tuberosities moved as a unit were happy with her overall reduction.  This was checked on fluoroscopy confirming our position.  We irrigated copiously at this point.  Hemostasis was obtained. The deltopectoral interval was reapproximated with #1 Ethibond. The subcutaneous tissues were closed with 3-0 Vicryl and the skin was closed with running monocryl.     The wounds were cleaned and dried and an Aquacel dressing was placed. The drapes taken down. The arm was placed into sling with abduction pillow. Patient was awakened, extubated, and transferred to the recovery room in stable condition. There were no intraoperative complications. The sponge, needle, and attention counts were correct at the end of the case.    Joya Gaskins, OPA-C, present and scrubbed throughout the case, critical for completion in a timely fashion, and for retraction, instrumentation, closure.

## 2018-07-23 MED ORDER — CELECOXIB 100 MG PO CAPS: 100.0000 mg | ORAL_CAPSULE | Freq: Two times a day (BID) | ORAL | 2 refills | Status: DC

## 2018-07-23 MED ORDER — ACETAMINOPHEN 500 MG PO TABS: 1000.0000 mg | ORAL_TABLET | Freq: Three times a day (TID) | ORAL | 0 refills | Status: AC

## 2018-07-23 MED ORDER — OXYCODONE HCL 5 MG PO TABS: ORAL_TABLET | ORAL | 0 refills | Status: AC

## 2018-07-23 MED ORDER — ONDANSETRON HCL 4 MG PO TABS: 4.0000 mg | ORAL_TABLET | Freq: Three times a day (TID) | ORAL | 1 refills | Status: AC | PRN

## 2018-07-23 NOTE — Discharge Summary (Signed)
Patient ID: Cindy Richmond MRN: [id number] DOB/AGE: 1950/10/03 68 y.o.  Admit date: 07/22/2018 Discharge date: 07/23/2018  Admission Diagnoses:Left proximal humerus fracture with headsplit  Discharge Diagnoses:  Active Problems:   Closed fracture of left proximal humerus   Past Medical History:  Diagnosis Date  . Aortic stenosis   . Dyspnea    with head low, sleep apnea  . GERD (gastroesophageal reflux disease)   . H/O hiatal hernia   . Heart murmur    asymptomatic  . Hematuria   . HOH (hard of hearing)    sightly, bilaterally  . Hyperlipidemia   . Hypertension   . Hypothyroidism   . Kidney stone on left side   . Parkinson disease (HCC)      Procedures Performed: L Reverse total shoulder arthroplasty  Discharged Condition: good  Hospital Course: Patient brought in as an outpatient for surgery.  Tolerated procedure well.  Was kept for monitoring overnight for pain control and medical monitoring postop and was found to be stable for DC home the morning after surgery.  Patient was instructed on specific activity restrictions and all questions were answered.   Consults: None  Significant Diagnostic Studies: No additional pertinent studies  Treatments: Surgery  Discharge Exam:  Dressing CDI and sling well fitting,  full and painless ROM throughout hand with DPC of 0.  Axillary nerve sensation/motor altered in setting of block and unable to be fully tested.  Distal motor and sensory altered in setting of block.   Disposition: Discharge disposition: 01-Home or Self Care       Discharge Instructions    Call MD for:  persistant nausea and vomiting   Complete by:  As directed    Call MD for:  redness, tenderness, or signs of infection (pain, swelling, redness, odor or green/yellow discharge around incision site)   Complete by:  As directed    Call MD for:  severe uncontrolled pain   Complete by:  As directed    Diet - low sodium heart healthy   Complete by:  As  directed    Discharge instructions   Complete by:  As directed    Ramond Marrow MD, MPH [facility] [address], [address] [phone] (tel)   [phone] (fax)   POST-OPERATIVE INSTRUCTIONS - TOTAL SHOULDER REPLACEMENT    WOUND CARE You may leave the operative dressing in place until your follow-up appointment.  This is a water resistant dressing which means its ok to shower.  This also means if there is drainage at the surgical site it will fill up with fluid and this is something I need to know about.  If this happens call and we will change the dressing as soon as possible.  KEEP THE INCISIONS CLEAN AND DRY. Use the Cryocuff, GameReady or Ice as often as possible for the first 3-4 days, then as needed for pain relief.  You may shower on Post-Op Day #2. The dressing is water resistant but do not scrub it as it may start to peel up.  You may remove the sling for showering, but keep a water resistant pillow under the arm to keep both the elbow and shoulder away from the body (mimicking the abduction sling). Gently pat the area dry. Do not soak the shoulder in water. Do not go swimming in the pool or ocean until your sutures are removed.  EXERCISES Wear the sling at all times except when doing your exercises. You may remove the sling for  showering, but keep the arm across the chest or in a secondary sling.   Accidental/Purposeful External Rotation and shoulder flexion (reaching behind you) is to be avoided at all costs for the first month. Please perform the exercises:   Elbow / Hand / Wrist  Range of Motion Exercises  FOLLOW-UP If you develop a Fever (>101.5), Redness or Drainage from the surgical incision site, please call our office to arrange for an evaluation. Please call the office to schedule a follow-up appointment for a wound check, 7-10 days post-operatively.    IF YOU HAVE ANY QUESTIONS, PLEASE FEEL FREE TO CALL OUR OFFICE.   HELPFUL INFORMATION   Your arm will be in a sling following surgery. You will be in this sling for the next 3-4 weeks.  I will let you know the exact duration at your follow-up visit.  You may be more comfortable sleeping in a semi-seated position the first few nights following surgery.  Keep a pillow propped under the elbow and forearm for comfort.  If you have a recliner type of chair it might be beneficial.  If not that is fine too, but it would be helpful to sleep propped up with pillows behind your operated shoulder as well under your elbow and forearm.  This will reduce pulling on the suture lines.  We suggest you use the pain medication the first night prior to going to bed, in order to ease any pain when the anesthesia wears off. You should avoid taking pain medications on an empty stomach as it will make you nauseous.  Do not drink alcoholic beverages or take illicit drugs when taking pain medications.  In most states it is against the law to drive while your arm is in a sling. And certainly against the law to drive while taking narcotics.  You may return to work/school in the next couple of days when you feel up to it. Desk work and typing in the sling is fine.  When dressing, put your operative arm in the sleeve first.  When getting undressed, take your operative arm out last.  Loose fitting, button-down shirts are recommended.  Pain medication may make you constipated.  Below are a few solutions to try in this order: Decrease the amount of pain medication if you aren't having pain. Drink lots of decaffeinated fluids. Drink prune juice and/or each dried prunes  If the first 3 don't work start with additional solutions Take Colace - an over-the-counter stool softener Take Senokot - an over-the-counter laxative Take Miralax - a stronger over-the-counter laxative   Increase activity slowly   Complete by:  As directed      Allergies as of 07/23/2018      Reactions   Penicillins Other (See Comments)    unknown Did it involve swelling of the face/tongue/throat, SOB, or low BP? Unknown Did it involve sudden or severe rash/hives, skin peeling, or any reaction on the inside of your mouth or nose? Unknown Did you need to seek medical attention at a hospital or doctor's office? Unknown When did it last happen?newborn allergy If all above answers are "NO", may proceed with cephalosporin use.      Medication List    TAKE these medications   acetaminophen 500 MG tablet Commonly known as:  TYLENOL Take 2 tablets (1,000 mg total) by mouth every 8 (eight) hours for 14 days. What changed:    when to take this  reasons to take this   bisoprolol-hydrochlorothiazide 5-6.25 MG tablet Commonly known  asAldean Ast Take 1 tablet by mouth daily.   carbidopa-levodopa 25-100 MG tablet Commonly known as:  SINEMET IR Take 1 tablet by mouth 4 (four) times daily.   celecoxib 100 MG capsule Commonly known as:  CeleBREX Take 1 capsule (100 mg total) by mouth 2 (two) times daily.   esomeprazole 20 MG capsule Commonly known as:  NEXIUM Take 20 mg by mouth daily.   levothyroxine 75 MCG tablet Commonly known as:  SYNTHROID Take 75 mcg by mouth daily.   ondansetron 4 MG tablet Commonly known as:  Zofran Take 1 tablet (4 mg total) by mouth every 8 (eight) hours as needed for up to 7 days for nausea or vomiting.   oxyCODONE 5 MG immediate release tablet Commonly known as:  Oxy IR/ROXICODONE Take 1-2 pills every 6 hrs as needed for pain, no more than 6 per day   SALONPAS PAIN RELIEF PATCH EX Apply 1 patch topically daily as needed (pain).   SYSTANE OP Place 1 drop into both eyes daily as needed (dry eyes).

## 2018-07-23 NOTE — Addendum Note (Signed)
Addendum  created 07/23/18 1105 by Theodosia Quay, CRNA   Charge Capture section accepted

## 2018-07-23 NOTE — Evaluation (Signed)
Occupational Therapy Evaluation Patient Details Name: Cindy Richmond MRN: [id number] DOB: 1950-02-29 Today's Date: 07/23/2018    History of Present Illness Cindy Richmond is a 68 y.o. female whom sustained LT PROXIMAL HUMERUS FRACTURE and is now s/p L reverse total shoulder arthroplasty. PMH includes but is not limited to: GERD, Parkinsons; hyperlipidemia, HTN; hypothyroidism, hiatial hernia; heart murmur.   Clinical Impression   Pt admitted as above and was assessed by OT followed by ADL retraining session to include toileting, bathing, dressing, sling application and instruction, home program for active elbow, wrist & hand only. No WB L UE and No ROM L shoulder. Pt was also instructed in positioning for sleep. Pt will have PRN assistance from her sister at d/c from hospital and is able to direct her as needed for care. She is overall Min assist for sling application and bathing and dressing at this time. She ambulates with SPC in right hand at supervision-Min guard level and requires frequent rest breaks. She will f/u with MD for progression of left shoulder home program and further therapy PRN.    Follow Up Recommendations  Follow surgeon's recommendation for DC plan and follow-up therapies;Supervision - Intermittent    Equipment Recommendations  None recommended by OT    Recommendations for Other Services       Precautions / Restrictions Precautions Precautions: Shoulder;Fall Type of Shoulder Precautions: NWB L shoulder; No ROM L shoulder. May do A/ROM elbow/forearm, hand and wrist only. Sling at all times, remove for ex's and to bathe only. Shoulder Interventions: Shoulder sling/immobilizer;Off for dressing/bathing/exercises Precaution Booklet Issued: Yes (comment) Precaution Comments: Handout with ex's and shoulder precautions reviewed verbally, written and handout issued to pt. Required Braces or Orthoses: Sling Restrictions Weight Bearing Restrictions: Yes LUE Weight Bearing: Non  weight bearing      Mobility Bed Mobility Overal bed mobility: Modified Independent             General bed mobility comments: Increased time, no physical assist required   Transfers Overall transfer level: Modified independent Equipment used: Straight cane             General transfer comment: No physical assist for transfers noted, Min guard for safety with initial sit to stand, and supervision for overall safety due to fall risk & h/o Parkinsons.    Balance Overall balance assessment: Modified Independent;No apparent balance deficits (not formally assessed)                                         ADL either performed or assessed with clinical judgement   ADL Overall ADL's : Needs assistance/impaired Eating/Feeding: Set up;Bed level   Grooming: Wash/dry hands;Wash/dry face;Oral care;Set up;Sitting;Standing;Min guard   Upper Body Bathing: Set up;Minimal assistance;Sitting   Lower Body Bathing: Minimal assistance;Sitting/lateral leans;Sit to/from stand   Upper Body Dressing : Set up;Minimal assistance;Sitting;Adhering to UE precautions   Lower Body Dressing: Minimal assistance;Sit to/from stand;Sitting/lateral leans   Toilet Transfer: Supervision/safety;Ambulation   Toileting- Clothing Manipulation and Hygiene: Supervision/safety;Sit to/from stand;Sitting/lateral lean   Tub/ Shower Transfer: Walk-in shower;Supervision/safety;Ambulation   Functional mobility during ADLs: Supervision/safety;Cane(Pt ambulates with SPC. Sister has been staying w/ pt for last few weeks and plans to cont to do so PRN.) General ADL Comments: Pt was educated in role of OT, shoulder precautions, No WB LUE, No ROM LUE. May do active elbow, wrist and hand (& participated in HEP  performance). Pt also participated in ADL retaining session for toileting, simulated shower transfer, & bathe and grooming sitting and standing at sink. Pt was also educated in sling use & positioning  for sleep. Pt reports that her sister has been staying with her and assisting with ADL's and will cont to do so as needed at d/c.     Vision Baseline Vision/History: Wears glasses Wears Glasses: Reading only Patient Visual Report: No change from baseline Vision Assessment?: No apparent visual deficits     Perception     Praxis      Pertinent Vitals/Pain Pain Assessment: No/denies pain     Hand Dominance Right   Extremity/Trunk Assessment Upper Extremity Assessment Upper Extremity Assessment: LUE deficits/detail LUE Deficits / Details: No ROM L UE following L reverse total shoulder arthroplasty. A/ROM WNL's noted L elbow, wrist and hand.  LUE: Unable to fully assess due to immobilization LUE Coordination: WNL   Lower Extremity Assessment Lower Extremity Assessment: Overall WFL for tasks assessed   Cervical / Trunk Assessment Cervical / Trunk Assessment: Normal   Communication Communication Communication: No difficulties;HOH   Cognition Arousal/Alertness: Awake/alert Behavior During Therapy: WFL for tasks assessed/performed Overall Cognitive Status: Within Functional Limits for tasks assessed                                     General Comments       Exercises Shoulder Exercises Elbow Flexion: AROM;10 reps;Seated;Left Elbow Extension: AROM;10 reps;Seated;Left Wrist Flexion: AROM;10 reps;Seated;Left Wrist Extension: AROM;10 reps;Seated;Left Digit Composite Flexion: AROM;10 reps;Seated;Left Composite Extension: AROM;Seated;10 reps;Left Hand Exercises Forearm Supination: AROM;Left;10 reps;Seated Forearm Pronation: AROM;10 reps;Left;Seated   Shoulder Instructions Shoulder Instructions Donning/doffing shirt without moving shoulder: Set-up;Min-guard;Patient able to independently direct caregiver(Pt sister to assist PRN) Method for sponge bathing under operated UE: Set-up;Min-guard;Patient able to independently direct caregiver(Sister will assist  PRN) Donning/doffing sling/immobilizer: Minimal assistance;Patient able to independently direct caregiver(Care giver to assist PRN. Verbal and written instructions as well as performance in pt room today.) Correct positioning of sling/immobilizer: Supervision/safety;Patient able to independently direct caregiver ROM for elbow, wrist and digits of operated UE: Modified independent Sling wearing schedule (on at all times/off for ADL's): Independent Proper positioning of operated UE when showering: Independent;Patient able to independently direct caregiver Positioning of UE while sleeping: Independent;Patient able to independently direct caregiver    Home Living Family/patient expects to be discharged to:: Private residence Living Arrangements: Alone(Sister lives in town and has been staying with pt since this injury. She is available to stay as long as needed per Pt report) Available Help at Discharge: Family;Available 24 hours/day Type of Home: House Home Access: Level entry     Home Layout: Able to live on main level with bedroom/bathroom     Bathroom Shower/Tub: Producer, television/film/videoWalk-in shower   Bathroom Toilet: Standard Bathroom Accessibility: Yes   Home Equipment: Shower seat - built in;Cane - single point          Prior Functioning/Environment Level of Independence: Independent with assistive device(s)        Comments: Pt sister is able to stay and assist PRN at d/c per pt report        OT Problem List:        OT Treatment/Interventions:      OT Goals(Current goals can be found in the care plan section) Acute Rehab OT Goals Patient Stated Goal: Go home today OT Goal Formulation: All assessment and education complete,  DC therapy  OT Frequency:     Barriers to D/C:            Co-evaluation              AM-PAC OT "6 Clicks" Daily Activity     Outcome Measure Help from another person eating meals?: None Help from another person taking care of personal grooming?:  None Help from another person toileting, which includes using toliet, bedpan, or urinal?: A Little Help from another person bathing (including washing, rinsing, drying)?: A Little Help from another person to put on and taking off regular upper body clothing?: A Little Help from another person to put on and taking off regular lower body clothing?: A Little 6 Click Score: 20   End of Session Equipment Utilized During Treatment: Other (comment)(SPC  RUE and Sling LUE) Nurse Communication: Mobility status;Other (comment)(Pt sister to assist PRN at d/c.)  Activity Tolerance: Patient tolerated treatment well;Patient limited by fatigue Patient left: in chair;with call bell/phone within reach  Pain - Right/Left: Left Pain - part of body: Shoulder                Time: 9562-1308 OT Time Calculation (min): 52 min Charges:  OT General Charges $OT Visit: 1 Visit OT Evaluation $OT Eval Low Complexity: 1 Low OT Treatments $Self Care/Home Management : 23-37 mins   Emmerich Cryer Beth Dixon, OTR/L 07/23/2018, 10:56 AM

## 2018-07-23 NOTE — Anesthesia Postprocedure Evaluation (Signed)
Anesthesia Post Note  Patient: Cindy Richmond  Procedure(s) Performed: REVERSE SHOULDER ARTHROPLASTY (Left )     Patient location during evaluation: PACU Anesthesia Type: General and Regional Level of consciousness: awake and alert Pain management: pain level controlled Vital Signs Assessment: post-procedure vital signs reviewed and stable Respiratory status: spontaneous breathing, nonlabored ventilation, respiratory function stable and patient connected to nasal cannula oxygen Cardiovascular status: blood pressure returned to baseline and stable Postop Assessment: no apparent nausea or vomiting Anesthetic complications: no    Last Vitals:  Vitals:   07/23/18 0427 07/23/18 0918  BP: 135/74 (!) 144/73  Pulse: 71 86  Resp: 16 18  Temp: (!) 36.3 C (!) 36.4 C  SpO2: 100% 95%    Last Pain:  Vitals:   07/23/18 0918  TempSrc: Oral  PainSc:                  Amariana Mirando S

## 2018-07-31 ENCOUNTER — Telehealth: Payer: Self-pay | Admitting: Neurology

## 2018-07-31 ENCOUNTER — Other Ambulatory Visit: Payer: Self-pay | Admitting: Neurology

## 2018-07-31 MED ORDER — CARBIDOPA-LEVODOPA 25-100 MG PO TABS: 1.0000 | ORAL_TABLET | Freq: Four times a day (QID) | ORAL | 1 refills | Status: DC

## 2018-07-31 NOTE — Telephone Encounter (Signed)
Requested Prescriptions   Pending Prescriptions Disp Refills  . carbidopa-levodopa (SINEMET IR) 25-100 MG tablet      Sig: Take 1 tablet by mouth 4 (four) times daily.   Rx last filled:12/09/17 #270 1 refills  Pt last seen:07/16/18  Follow up appt scheduled:11/05/18

## 2018-07-31 NOTE — Telephone Encounter (Signed)
Patient called regarding needing a refill on her medication Carbidopa Levodopa. She uses [facility] in [place]. Thanks

## 2018-07-31 NOTE — Telephone Encounter (Signed)
rx sent

## 2018-08-03 ENCOUNTER — Other Ambulatory Visit: Payer: Self-pay | Admitting: Family Medicine

## 2018-08-03 ENCOUNTER — Encounter (HOSPITAL_COMMUNITY): Payer: Self-pay | Admitting: Orthopaedic Surgery

## 2018-08-03 DIAGNOSIS — E2839 Other primary ovarian failure: Secondary | ICD-10-CM

## 2018-08-17 ENCOUNTER — Telehealth: Payer: Self-pay | Admitting: Cardiology

## 2018-08-17 DIAGNOSIS — Z79899 Other long term (current) drug therapy: Secondary | ICD-10-CM

## 2018-08-17 MED ORDER — FUROSEMIDE 20 MG PO TABS: 20.0000 mg | ORAL_TABLET | Freq: Every day | ORAL | 0 refills | Status: DC

## 2018-08-17 NOTE — Telephone Encounter (Signed)
Spoke with pt who c/o pitting edema to BLE, knees down. She states her legs have been extremely swollen since  5/27 when she had her shoulder surgery. Pt states she elevates her legs as much as she can during the day but notes that she does not sleep in her bed at night because of her shoulder. Pt denies indiscretion with salt. Does not own compression stockings. Pt on bisoprolol-HCTZ 5-6.25 mg daily and does not feel this is helping with swelling. Consulted DOD Dr. Claiborne Billings who recommended pt start furosemide 20 mg daily for 3 days and then can take every other day to help with swelling. Return in 7-10 days for BMET.   Contacted pt and made pt aware of Dr. Evette Georges recommendation. Pt will pick up Rx from pharmacy on file and will return to office in 7-10 days for lab work.

## 2018-08-17 NOTE — Telephone Encounter (Signed)
Pt c/o swelling: STAT is pt has developed SOB within 24 hours  1) How much weight have you gained and in what time span? No log  2) If swelling, where is the swelling located? Legs and feet  3) Are you currently taking a fluid pill? celebrex  4) Are you currently SOB? Yes. SOB has been a symptom for at least the past year  5) Do you have a log of your daily weights (if so, list)? No log  6) Have you gained 3 pounds in a day or 5 pounds in a week? No log  7) Have you traveled recently? no

## 2018-08-24 NOTE — Telephone Encounter (Signed)
Follow up     Patient is following up because she still has swelling in her legs. Please call to discuss.

## 2018-08-24 NOTE — Telephone Encounter (Signed)
LMVM-appt scheduled 09-01-2018 @845am  to arrive 830am for check-in detailed message, pt only to come into office ([facility]) and wear a mask.

## 2018-08-24 NOTE — Telephone Encounter (Signed)
Agree with labs - would probably advise to stay on 20 mg daily. Needs in office follow-up with Dr. Claiborne Billings or APP.  Dr Lemmie Evens

## 2018-08-24 NOTE — Telephone Encounter (Signed)
Patient states her bilateral lower extremity "swelling has improved a little" but her "legs are still really tight." She confirmed that she took furosemide 20 mg daily for three days and now she is currently taking this medication every other day as instructed by Dr. Claiborne Billings. She is taking all other medications as prescribed.   Patient is still concerned about the edema in her lower extremities. Advised her to come to our office for follow up lab work. Patient will have a BMP drawn on Wednesday, 08/26/2018.   Will inform DOD and have him advise of any further recommendations.

## 2018-08-27 LAB — BASIC METABOLIC PANEL
BUN/Creatinine Ratio: 11 — ABNORMAL LOW (ref 12–28)
BUN: 12 mg/dL (ref 8–27)
CO2: 24 mmol/L (ref 20–29)
Calcium: 9.2 mg/dL (ref 8.7–10.3)
Chloride: 103 mmol/L (ref 96–106)
Creatinine, Ser: 1.11 mg/dL — ABNORMAL HIGH (ref 0.57–1.00)
GFR calc Af Amer: 58 mL/min/{1.73_m2} — ABNORMAL LOW (ref >59)
GFR calc non Af Amer: 50 mL/min/{1.73_m2} — ABNORMAL LOW (ref >59)
Glucose: 108 mg/dL — ABNORMAL HIGH (ref 65–99)
Potassium: 4.1 mmol/L (ref 3.5–5.2)
Sodium: 143 mmol/L (ref 134–144)

## 2018-09-01 ENCOUNTER — Ambulatory Visit: Payer: Medicare Other | Admitting: Cardiology

## 2018-09-08 ENCOUNTER — Ambulatory Visit: Payer: Medicare Other | Admitting: Cardiology

## 2018-09-09 ENCOUNTER — Other Ambulatory Visit: Payer: Self-pay | Admitting: Cardiovascular Disease

## 2018-09-11 ENCOUNTER — Other Ambulatory Visit: Payer: Self-pay | Admitting: Cardiovascular Disease

## 2018-09-17 ENCOUNTER — Telehealth: Payer: Self-pay | Admitting: Cardiology

## 2018-09-17 NOTE — Telephone Encounter (Signed)
LVM, asking pt to call  Back and be pre-screened for COVID-19.

## 2018-09-18 ENCOUNTER — Ambulatory Visit (INDEPENDENT_AMBULATORY_CARE_PROVIDER_SITE_OTHER): Payer: Medicare Other | Admitting: Cardiology

## 2018-09-18 ENCOUNTER — Other Ambulatory Visit: Payer: Self-pay

## 2018-09-18 VITALS — BP 121/83 | HR 68 | Temp 97.5°F | Ht 66.0 in | Wt 286.0 lb

## 2018-09-18 DIAGNOSIS — G4733 Obstructive sleep apnea (adult) (pediatric): Secondary | ICD-10-CM | POA: Diagnosis not present

## 2018-09-18 DIAGNOSIS — R600 Localized edema: Secondary | ICD-10-CM | POA: Diagnosis not present

## 2018-09-18 DIAGNOSIS — I350 Nonrheumatic aortic (valve) stenosis: Secondary | ICD-10-CM

## 2018-09-18 DIAGNOSIS — G20 Parkinson's disease: Secondary | ICD-10-CM

## 2018-09-18 MED ORDER — FUROSEMIDE 20 MG PO TABS: 20.0000 mg | ORAL_TABLET | Freq: Every day | ORAL | 3 refills | Status: DC

## 2018-09-18 MED ORDER — POTASSIUM CHLORIDE CRYS ER 20 MEQ PO TBCR: 20.0000 meq | EXTENDED_RELEASE_TABLET | Freq: Every day | ORAL | 0 refills | Status: DC

## 2018-09-18 NOTE — Assessment & Plan Note (Signed)
Slight resting Rt arm tremor

## 2018-09-18 NOTE — Patient Instructions (Signed)
Medication Instructions:   START LASIX 40 MG DAILY FOR 3 DAYS, AFTERWARDS TAKE LASIX 20 MG DAILY   TAKE POTASSIUM FOR 3 DAYS THEN STOP  If you need a refill on your cardiac medications before your next appointment, please call your pharmacy.   Lab work: NONE ordered at this time of appointment   If you have labs (blood work) drawn today and your tests are completely normal, you will receive your results only by: Marland Kitchen MyChart Message (if you have MyChart) OR . A paper copy in the mail If you have any lab test that is abnormal or we need to change your treatment, we will call you to review the results.  Testing/Procedures: NONE ordered at this time of appointment   Follow-Up: At [facility], you and your health needs are our priority.  As part of our continuing mission to provide you with exceptional heart care, we have created designated Provider Care Teams.  These Care Teams include your primary Cardiologist (physician) and Advanced Practice Providers (APPs -  Physician Assistants and Nurse Practitioners) who all work together to provide you with the care you need, when you need it. . Your physician recommends that you schedule a follow-up appointment in 3-4 WEEKS with Kerin Ransom, PA-C  Any Other Special Instructions Will Be Listed Below (If Applicable).

## 2018-09-18 NOTE — Progress Notes (Signed)
Cardiology Office Note:    Date:  09/18/2018   ID:  Cindy Richmond, Cindy Richmond Aug 11, 1950, MRN [id number]  PCP:  Jonathon Jordan, MD  Cardiologist:  Kirk Ruths, MD  Electrophysiologist:  None   Referring MD: Jonathon Jordan, MD   C/C: edema  History of Present Illness:    Cindy Richmond is a pleasant, morbidly obese 68 y.o. female with a hx of aortic stenosis, HTN, and s/p recent Lt shoulder surgery.  The pat says she noted some LE edema prior to her shoulder surgery but felt it was worse afterwards.  She denies any unusual DOE, syncope, or chest pain.  She was told to add Lasix 20 mg daily x 3 days to her medications, then cut the Lasix back to 20 mg daily.  We thought she was on Ziac but on review today she says she has not been on that medication.  Her edema improved some but is still present.   Past Medical History:  Diagnosis Date  . Aortic stenosis   . Dyspnea    with head low, sleep apnea  . GERD (gastroesophageal reflux disease)   . H/O hiatal hernia   . Heart murmur    asymptomatic  . Hematuria   . HOH (hard of hearing)    sightly, bilaterally  . Hyperlipidemia   . Hypertension   . Hypothyroidism   . Kidney stone on left side   . Parkinson disease [facility])     Past Surgical History:  Procedure Laterality Date  . ABDOMINAL HYSTERECTOMY     lso  . CYSTOSCOPY W/ RETROGRADES  02/28/2012   Procedure: CYSTOSCOPY WITH RETROGRADE PYELOGRAM;  Surgeon: Alexis Frock, MD;  Location: [facility];  Service: Urology;  Laterality: Right;  . CYSTOSCOPY WITH STENT PLACEMENT  02/28/2012   Procedure: CYSTOSCOPY WITH STENT PLACEMENT;  Surgeon: Alexis Frock, MD;  Location[facility];  Service: Urology;  Laterality: Left;  . CYSTOSCOPY/RETROGRADE/URETEROSCOPY/STONE EXTRACTION WITH BASKET  02/28/2012   Procedure: CYSTOSCOPY/RETROGRADE/URETEROSCOPY/STONE EXTRACTION WITH BASKET;  Surgeon: Alexis Frock, MD;  Location: [facility];  Service: Urology;   Laterality: Left;  . HOLMIUM LASER APPLICATION  11/02/3380   Procedure: HOLMIUM LASER APPLICATION;  Surgeon: Alexis Frock, MD;  Location: [facility];  Service: Urology;  Laterality: Left;  . KNEE ARTHROSCOPY     right  . REVERSE SHOULDER ARTHROPLASTY Left 07/22/2018   Procedure: REVERSE SHOULDER ARTHROPLASTY;  Surgeon: Hiram Gash, MD;  Location: [facility];  Service: Orthopedics;  Laterality: Left;  . TONSILLECTOMY    . VEIN LIGATION      Current Medications: Current Meds  Medication Sig  . carbidopa-levodopa (SINEMET IR) 25-100 MG tablet Take 1 tablet by mouth 4 (four) times daily.  . celecoxib (CELEBREX) 100 MG capsule Take 1 capsule (100 mg total) by mouth 2 (two) times daily.  . Cholecalciferol (VITAMIN D3 PO) Take 50 mcg by mouth.  . esomeprazole (NEXIUM) 20 MG capsule Take 20 mg by mouth daily.   Marland Kitchen levothyroxine (SYNTHROID, LEVOTHROID) 75 MCG tablet Take 75 mcg by mouth daily.  . Omega-3 Fatty Acids (OMEGA 3 PO) Take by mouth.  Vladimir Faster Glycol-Propyl Glycol (SYSTANE OP) Place 1 drop into both eyes daily as needed (dry eyes).  . [DISCONTINUED] bisoprolol-hydrochlorothiazide (ZIAC) 5-6.25 MG per tablet Take 1 tablet by mouth daily.  . [DISCONTINUED] furosemide (LASIX) 20 MG tablet Take 1 tablet (20 mg total) by mouth every other day. OFFICE VISIT NEEDED  . [DISCONTINUED] Liniments (SALONPAS PAIN  RELIEF PATCH EX) Apply 1 patch topically daily as needed (pain).     Allergies:   Penicillins   Social History   Socioeconomic History  . Marital status: Widowed    Spouse name: Not on file  . Number of children: 3  . Years of education: Not on file  . Highest education level: Not on file  Occupational History  . 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 Financial resource strain: Not on file  . Food insecurity    Worry: Not on file    Inability: Not on file  . Transportation needs    Medical: Not on file    Non-medical: Not on file  Tobacco  Use  . Smoking status: Never Smoker  . Smokeless tobacco: Never Used  Substance and Sexual Activity  . Alcohol use: Never    Frequency: Never  . Drug use: Never  . Sexual activity: Not on file  Lifestyle  . Physical activity    Days per week: Not on file    Minutes per session: Not on file  . Stress: Not on file  Relationships  . Social Musicianconnections    Talks on phone: Not on file    Gets together: Not on file    Attends religious service: Not on file    Active member of club or organization: Not on file    Attends meetings of clubs or organizations: Not on file    Relationship status: Not on file  Other Topics Concern  . Not on file  Social History Narrative  . Not on file     Family History: The patient's family history includes CAD in her mother; Healthy in her sister and son; Heart failure in her father.  ROS:   Please see the history of present illness.     All other systems reviewed and are negative.  EKGs/Labs/Other Studies Reviewed:    The following studies were reviewed today:  Recent Labs: 07/17/2018: Hemoglobin 15.3; Platelets 193 08/26/2018: BUN 12; Creatinine, Ser 1.11; Potassium 4.1; Sodium 143  Recent Lipid Panel No results found for: CHOL, TRIG, HDL, CHOLHDL, VLDL, LDLCALC, LDLDIRECT  Physical Exam:    VS:  BP 121/83   Pulse 68   Temp (!) 97.5 F (36.4 C)   Ht 5\' 6"  (1.676 m)   Wt 286 lb (129.7 kg)   SpO2 95%   BMI 46.16 kg/m     Wt Readings from Last 3 Encounters:  09/18/18 286 lb (129.7 kg)  07/22/18 290 lb (131.5 kg)  07/17/18 290 lb (131.5 kg)     GEN: Morbidly obese female, in no acute distress HEENT: Normal NECK: No JVD; No carotid bruits LYMPHATICS: No lymphadenopathy CARDIAC: RRR, 2/6 systolic murmur, no rubs, gallops RESPIRATORY:  Clear to auscultation without rales, wheezing or rhonchi  ABDOMEN: Soft, non-tender, non-distended MUSCULOSKELETAL:  No edema; No deformity  SKIN: Warm and dry NEUROLOGIC:  Alert and oriented x  3 PSYCHIATRIC:  Normal affect   ASSESSMENT:    Edema of both legs Pt seen for edema  Aortic stenosis, moderate to severe Hyperdynamic EF and increased gradients on echo 07/16/2018  Obesity, morbid (HCC) BMI 46  Parkinson's disease (HCC) Slight resting Rt arm tremor  PLAN:    Lasix 40 mg and K+ 20 Meq x 2 days, then just lasix 20 mg daily.  F/U with me 3-4 weeks.     Medication Adjustments/Labs and Tests Ordered: Current medicines are reviewed at length with the  patient today.  Concerns regarding medicines are outlined above.  No orders of the defined types were placed in this encounter.  Meds ordered this encounter  Medications  . furosemide (LASIX) 20 MG tablet    Sig: Take 1 tablet (20 mg total) by mouth daily.    Dispense:  90 tablet    Refill:  3  . potassium chloride SA (K-DUR) 20 MEQ tablet    Sig: Take 1 tablet (20 mEq total) by mouth daily for 3 days.    Dispense:  3 tablet    Refill:  0    Patient Instructions  Medication Instructions:   START LASIX 40 MG DAILY FOR 3 DAYS, AFTERWARDS TAKE LASIX 20 MG DAILY   TAKE POTASSIUM FOR 3 DAYS THEN STOP  If you need a refill on your cardiac medications before your next appointment, please call your pharmacy.   Lab work: NONE ordered at this time of appointment   If you have labs (blood work) drawn today and your tests are completely normal, you will receive your results only by: Marland Kitchen. MyChart Message (if you have MyChart) OR . A paper copy in the mail If you have any lab test that is abnormal or we need to change your treatment, we will call you to review the results.  Testing/Procedures: NONE ordered at this time of appointment   Follow-Up: At [facility], you and your health needs are our priority.  As part of our continuing mission to provide you with exceptional heart care, we have created designated Provider Care Teams.  These Care Teams include your primary Cardiologist (physician) and Advanced Practice  Providers (APPs -  Physician Assistants and Nurse Practitioners) who all work together to provide you with the care you need, when you need it. . Your physician recommends that you schedule a follow-up appointment in 3-4 WEEKS with Corine ShelterLuke Chong January, PA-C  Any Other Special Instructions Will Be Listed Below (If Applicable).       Jolene ProvostSigned, Capri Raben, PA-C  09/18/2018 3:58 PM    [facility]

## 2018-09-18 NOTE — Assessment & Plan Note (Signed)
Pt seen for edema

## 2018-09-18 NOTE — Assessment & Plan Note (Signed)
Hyperdynamic EF and increased gradients on echo 07/16/2018 

## 2018-09-18 NOTE — Assessment & Plan Note (Signed)
BMI 46 ?

## 2018-10-20 ENCOUNTER — Ambulatory Visit (INDEPENDENT_AMBULATORY_CARE_PROVIDER_SITE_OTHER): Payer: Medicare Other | Admitting: Cardiology

## 2018-10-20 ENCOUNTER — Other Ambulatory Visit: Payer: Self-pay

## 2018-10-20 ENCOUNTER — Encounter: Payer: Self-pay | Admitting: Cardiology

## 2018-10-20 VITALS — BP 169/94 | HR 66 | Temp 96.8°F | Ht 65.0 in | Wt 281.6 lb

## 2018-10-20 DIAGNOSIS — G4733 Obstructive sleep apnea (adult) (pediatric): Secondary | ICD-10-CM

## 2018-10-20 DIAGNOSIS — G20 Parkinson's disease: Secondary | ICD-10-CM

## 2018-10-20 DIAGNOSIS — R600 Localized edema: Secondary | ICD-10-CM | POA: Diagnosis not present

## 2018-10-20 DIAGNOSIS — I350 Nonrheumatic aortic (valve) stenosis: Secondary | ICD-10-CM

## 2018-10-20 NOTE — Assessment & Plan Note (Signed)
Hyperdynamic EF and increased gradients on echo 07/16/2018

## 2018-10-20 NOTE — Progress Notes (Signed)
Cardiology Office Note:    Date:  10/20/2018   ID:  Cindy, Richmond Jun 30, 1950, MRN [id number]  PCP:  Mila Palmer, MD  Cardiologist:  [name], MD  Electrophysiologist:  None   Referring MD: Mila Palmer, MD   CC: Edema  History of Present Illness:    Cindy Richmond is a 68 y.o. female with a hx of morbid obesity with a BMI of 46, hypertension, and moderate aortic stenosis with normal LV function by echocardiogram 07/16/2018.  Her ejection fraction was greater than 65%.  Her peak gradient was 63 mmHg, mean 37.  These gradients were increased compared to her prior echo.    The patient was seen 09/18/2018 with complaints of lower extremity edema that had started after shoulder surgery earlier this year.  She initially was placed on short course of diuretics which she said did improve her edema but then it recurred.  When I saw her in the office 09/18/2018 I suggested we increase her Lasix to 40 mg a day for 3 days and take potassium with this and then go to Lasix 20 mg daily with no potasium.  She is here for follow-up.  Unfortunately she was confused about her instructions and actually followed what was written on her original pill bottles.  She never increased her Lasix to 40 mg a day for 3 days and she is only been taking her Lasix 20 mg every other day.  She still has edema.  Past Medical History:  Diagnosis Date  . Aortic stenosis   . Dyspnea    with head low, sleep apnea  . GERD (gastroesophageal reflux disease)   . H/O hiatal hernia   . Heart murmur    asymptomatic  . Hematuria   . HOH (hard of hearing)    sightly, bilaterally  . Hyperlipidemia   . Hypertension   . Hypothyroidism   . Kidney stone on left side   . Parkinson disease [facility])     Past Surgical History:  Procedure Laterality Date  . ABDOMINAL HYSTERECTOMY     lso  . CYSTOSCOPY W/ RETROGRADES  02/28/2012   Procedure: CYSTOSCOPY WITH RETROGRADE PYELOGRAM;  Surgeon: Sebastian Ache, MD;  Location: [facility];  Service: Urology;  Laterality: Right;  . CYSTOSCOPY WITH STENT PLACEMENT  02/28/2012   Procedure: CYSTOSCOPY WITH STENT PLACEMENT;  Surgeon: Sebastian Ache, MD;  Location: [facility];  Service: Urology;  Laterality: Left;  . CYSTOSCOPY/RETROGRADE/URETEROSCOPY/STONE EXTRACTION WITH BASKET  02/28/2012   Procedure: CYSTOSCOPY/RETROGRADE/URETEROSCOPY/STONE EXTRACTION WITH BASKET;  Surgeon: Sebastian Ache, MD;  Location: [facility];  Service: Urology;  Laterality: Left;  . HOLMIUM LASER APPLICATION  02/28/2012   Procedure: HOLMIUM LASER APPLICATION;  Surgeon: Sebastian Ache, MD;  Location: [facility];  Service: Urology;  Laterality: Left;  . KNEE ARTHROSCOPY     right  . REVERSE SHOULDER ARTHROPLASTY Left 07/22/2018   Procedure: REVERSE SHOULDER ARTHROPLASTY;  Surgeon: Bjorn Pippin, MD;  Location: [facility];  Service: Orthopedics;  Laterality: Left;  . TONSILLECTOMY    . VEIN LIGATION      Current Medications: Current Meds  Medication Sig  . carbidopa-levodopa (SINEMET IR) 25-100 MG tablet Take 1 tablet by mouth 4 (four) times daily.  . celecoxib (CELEBREX) 100 MG capsule Take 1 capsule (100 mg total) by mouth 2 (two) times daily.  . Cholecalciferol (VITAMIN D3 PO) Take 50 mcg by mouth.  . esomeprazole (NEXIUM) 20 MG capsule Take 20 mg by  mouth daily.   . furosemide (LASIX) 20 MG tablet Take 20 mg by mouth every other day.  . levothyroxine (SYNTHROID, LEVOTHROID) 75 MCG tablet Take 75 mcg by mouth daily.  . Omega-3 Fatty Acids (OMEGA 3 PO) Take by mouth.  Vladimir Faster Glycol-Propyl Glycol (SYSTANE OP) Place 1 drop into both eyes daily as needed (dry eyes).     Allergies:   Penicillins   Social History   Socioeconomic History  . Marital status: Widowed    Spouse name: Not on file  . Number of children: 3  . Years of education: Not on file  . Highest education level: Not on file  Occupational History  . 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 Financial resource strain: Not on file  . Food insecurity    Worry: Not on file    Inability: Not on file  . Transportation needs    Medical: Not on file    Non-medical: Not on file  Tobacco Use  . Smoking status: Never Smoker  . Smokeless tobacco: Never Used  Substance and Sexual Activity  . Alcohol use: Never    Frequency: Never  . Drug use: Never  . Sexual activity: Not on file  Lifestyle  . Physical activity    Days per week: Not on file    Minutes per session: Not on file  . Stress: Not on file  Relationships  . Social Herbalist on phone: Not on file    Gets together: Not on file    Attends religious service: Not on file    Active member of club or organization: Not on file    Attends meetings of clubs or organizations: Not on file    Relationship status: Not on file  Other Topics Concern  . Not on file  Social History Narrative  . Not on file     Family History: The patient's family history includes CAD in her mother; Healthy in her sister and son; Heart failure in her father.  ROS:   Please see the history of present illness.     All other systems reviewed and are negative.  EKGs/Labs/Other Studies Reviewed:    The following studies were reviewed today: Echo May 2020  Recent Labs: 07/17/2018: Hemoglobin 15.3; Platelets 193 08/26/2018: BUN 12; Creatinine, Ser 1.11; Potassium 4.1; Sodium 143  Recent Lipid Panel No results found for: CHOL, TRIG, HDL, CHOLHDL, VLDL, LDLCALC, LDLDIRECT  Physical Exam:    VS:  BP (!) 169/94   Pulse 66   Temp (!) 96.8 F (36 C)   Ht 5\' 5"  (1.651 m)   Wt 281 lb 9.6 oz (127.7 kg)   SpO2 96%   BMI 46.86 kg/m     Wt Readings from Last 3 Encounters:  10/20/18 281 lb 9.6 oz (127.7 kg)  09/18/18 286 lb (129.7 kg)  07/22/18 290 lb (131.5 kg)     GEN: Morbidly obese Caucasian female well developed in no acute distress HEENT: Normal NECK: No JVD; No carotid  bruits LYMPHATICS: No lymphadenopathy CARDIAC: RRR, 2//6 systolic murmur RESPIRATORY:  Clear to auscultation without rales, wheezing or rhonchi  ABDOMEN: Soft, non-tender, non-distended MUSCULOSKELETAL: 1-2+ pre tibial pitting edema. No deformity  SKIN: Warm and dry NEUROLOGIC:  Alert and oriented x 3, resting tremor RUE PSYCHIATRIC:  Normal affect   ASSESSMENT:    Edema of both legs Unfortunately the patient was confused about the Lasix instructions and she still has edema.  We'll try Lasix 40 mg daily x 3 days, then Lasix 20 mg daily.  I'll see her back in a couple of weeks.  I suspect she will need chronic diuretic.   Aortic stenosis, moderate to severe Hyperdynamic EF and increased gradients on echo 07/16/2018  Obesity, morbid (HCC) BMI 46  Obstructive sleep apnea On C-pap  Parkinson's disease (HCC) Resting RUE tremor  PLAN:    Instructions reviewed with the patient and written out- Lasix 40 mg daily x 3 days, then Lasix 20 mg daily.  F/U 2-3 weeks.    Medication Adjustments/Labs and Tests Ordered: Current medicines are reviewed at length with the patient today.  Concerns regarding medicines are outlined above.  No orders of the defined types were placed in this encounter.  No orders of the defined types were placed in this encounter.   Patient Instructions  Medication Instructions:  PLEASE TAKE 40MG  OF LASIX ONCE A DAY FOR 3 (THREE) DAYS THEN ON THE FOURTH DAY DECREASE LASIX TO 20MG  ONCE A DAY UNTIL YOUR NEXT APPOINTMENT EAT A BANANA WHEN YOU TAKE 40MG  OF LASIX (FOR THREE DAYS) If you need a refill on your cardiac medications before your next appointment, please call your pharmacy.   Lab work: NONE  If you have labs (blood work) drawn today and your tests are completely normal, you will receive your results only by: Marland Kitchen. MyChart Message (if you have MyChart) OR . A paper copy in the mail If you have any lab test that is abnormal or we need to change your treatment,  we will call you to review the results.  Testing/Procedures: NONE   Follow-Up: At [facility], you and your health needs are our priority.  As part of our continuing mission to provide you with exceptional heart care, we have created designated Provider Care Teams.  These Care Teams include your primary Cardiologist (physician) and Advanced Practice Providers (APPs -  Physician Assistants and Nurse Practitioners) who all work together to provide you with the care you need, when you need it.  . Your physician recommends that you schedule a follow-up appointment in: 2-3 WEEKS WITH Corine ShelterLUKE Hayes Czaja, PA-C  Any Other Special Instructions Will Be Listed Below (If Applicable).      Signed, Corine ShelterLuke Kalliope Riesen, PA-C  10/20/2018 3:35 PM    [facility]

## 2018-10-20 NOTE — Assessment & Plan Note (Signed)
On C-pap 

## 2018-10-20 NOTE — Patient Instructions (Signed)
Medication Instructions:  PLEASE TAKE 40MG  OF LASIX ONCE A DAY FOR 3 (THREE) DAYS THEN ON THE FOURTH DAY DECREASE LASIX TO 20MG  ONCE A DAY UNTIL YOUR NEXT APPOINTMENT EAT A BANANA WHEN YOU TAKE 40MG  OF LASIX (FOR THREE DAYS) If you need a refill on your cardiac medications before your next appointment, please call your pharmacy.   Lab work: NONE  If you have labs (blood work) drawn today and your tests are completely normal, you will receive your results only by: Marland Kitchen MyChart Message (if you have MyChart) OR . A paper copy in the mail If you have any lab test that is abnormal or we need to change your treatment, we will call you to review the results.  Testing/Procedures: NONE   Follow-Up: At [facility], you and your health needs are our priority.  As part of our continuing mission to provide you with exceptional heart care, we have created designated Provider Care Teams.  These Care Teams include your primary Cardiologist (physician) and Advanced Practice Providers (APPs -  Physician Assistants and Nurse Practitioners) who all work together to provide you with the care you need, when you need it.  . Your physician recommends that you schedule a follow-up appointment in: 2-3 [place], PA-C  Any Other Special Instructions Will Be Listed Below (If Applicable).

## 2018-10-20 NOTE — Assessment & Plan Note (Signed)
BMI 46 ?

## 2018-10-20 NOTE — Assessment & Plan Note (Signed)
Resting RUE tremor

## 2018-10-20 NOTE — Assessment & Plan Note (Signed)
Unfortunately the patient was confused about the Lasix instructions and she still has edema. We'll try Lasix 40 mg daily x 3 days, then Lasix 20 mg daily.  I'll see her back in a couple of weeks.  I suspect she will need chronic diuretic.

## 2018-11-03 NOTE — Progress Notes (Signed)
Cindy Richmond was seen today in follow up for Parkinsons disease.  Pt is currently on carbidopa/levodopa 25/100, 1 tablet 4 times per day.  Pt denies falls.  Pt denies lightheadedness, near syncope.  No hallucinations.  Mood has been good.  Medical records are reviewed since our last visit.  She had surgery for her humeral neck fracture on May 27.  She has recovered well.  She is done with PT now.    PREVIOUS MEDICATIONS: Sinemet  ALLERGIES:   Allergies  Allergen Reactions  . Penicillins Other (See Comments)    unknown Did it involve swelling of the face/tongue/throat, SOB, or low BP? Unknown Did it involve sudden or severe rash/hives, skin peeling, or any reaction on the inside of your mouth or nose? Unknown Did you need to seek medical attention at a hospital or doctor's office? Unknown When did it last happen?newborn allergy If all above answers are "NO", may proceed with cephalosporin use.     CURRENT MEDICATIONS:  Outpatient Encounter Medications as of 11/05/2018  Medication Sig  . carbidopa-levodopa (SINEMET IR) 25-100 MG tablet Take 1 tablet by mouth 4 (four) times daily.  . Cholecalciferol (VITAMIN D3 PO) Take 50 mcg by mouth.  . esomeprazole (NEXIUM) 20 MG capsule Take 20 mg by mouth daily.   Marland Kitchen levothyroxine (SYNTHROID, LEVOTHROID) 75 MCG tablet Take 75 mcg by mouth daily.  . Omega-3 Fatty Acids (OMEGA 3 PO) Take by mouth.  Bertram Gala Glycol-Propyl Glycol (SYSTANE OP) Place 1 drop into both eyes daily as needed (dry eyes).  . bisoprolol-hydrochlorothiazide (ZIAC) 5-6.25 MG tablet Take 1 tablet by mouth daily.  . [DISCONTINUED] celecoxib (CELEBREX) 100 MG capsule Take 1 capsule (100 mg total) by mouth 2 (two) times daily. (Patient not taking: Reported on 11/05/2018)  . [DISCONTINUED] furosemide (LASIX) 20 MG tablet Take 20 mg by mouth every other day.  . [DISCONTINUED] potassium chloride SA (K-DUR) 20 MEQ tablet Take 1 tablet (20 mEq total) by mouth daily for 3 days.    No facility-administered encounter medications on file as of 11/05/2018.     PAST MEDICAL HISTORY:   Past Medical History:  Diagnosis Date  . Aortic stenosis   . Dyspnea    with head low, sleep apnea  . GERD (gastroesophageal reflux disease)   . H/O hiatal hernia   . Heart murmur    asymptomatic  . Hematuria   . HOH (hard of hearing)    sightly, bilaterally  . Hyperlipidemia   . Hypertension   . Hypothyroidism   . Kidney stone on left side   . Parkinson disease (HCC)     PAST SURGICAL HISTORY:   Past Surgical History:  Procedure Laterality Date  . ABDOMINAL HYSTERECTOMY     lso  . CYSTOSCOPY W/ RETROGRADES  02/28/2012   Procedure: CYSTOSCOPY WITH RETROGRADE PYELOGRAM;  Surgeon: Sebastian Ache, MD;  Location: [facility];  Service: Urology;  Laterality: Right;  . CYSTOSCOPY WITH STENT PLACEMENT  02/28/2012   Procedure: CYSTOSCOPY WITH STENT PLACEMENT;  Surgeon: Sebastian Ache, MD;  Location: [facility];  Service: Urology;  Laterality: Left;  . CYSTOSCOPY/RETROGRADE/URETEROSCOPY/STONE EXTRACTION WITH BASKET  02/28/2012   Procedure: CYSTOSCOPY/RETROGRADE/URETEROSCOPY/STONE EXTRACTION WITH BASKET;  Surgeon: Sebastian Ache, MD;  Location[facility];  Service: Urology;  Laterality: Left;  . HOLMIUM LASER APPLICATION  02/28/2012   Procedure: HOLMIUM LASER APPLICATION;  Surgeon: Sebastian Ache, MD;  Location: [facility];  Service: Urology;  Laterality: Left;  .  KNEE ARTHROSCOPY     right  . REVERSE SHOULDER ARTHROPLASTY Left 07/22/2018   Procedure: REVERSE SHOULDER ARTHROPLASTY;  Surgeon: Hiram Gash, MD;  Location: [facility];  Service: Orthopedics;  Laterality: Left;  . TONSILLECTOMY    . VEIN LIGATION      SOCIAL HISTORY:   Social History   Socioeconomic History  . Marital status: Widowed    Spouse name: Not on file  . Number of children: 3  . Years of education: Not on file  . Highest education level: Associate  degree: occupational, Hotel manager, or vocational program  Occupational History  . 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 Financial resource strain: Not on file  . Food insecurity    Worry: Not on file    Inability: Not on file  . Transportation needs    Medical: Not on file    Non-medical: Not on file  Tobacco Use  . Smoking status: Never Smoker  . Smokeless tobacco: Never Used  Substance and Sexual Activity  . Alcohol use: Never    Frequency: Never  . Drug use: Never  . Sexual activity: Not on file  Lifestyle  . Physical activity    Days per week: Not on file    Minutes per session: Not on file  . Stress: Not on file  Relationships  . Social Herbalist on phone: Not on file    Gets together: Not on file    Attends religious service: Not on file    Active member of club or organization: Not on file    Attends meetings of clubs or organizations: Not on file    Relationship status: Not on file  . Intimate partner violence    Fear of current or ex partner: Not on file    Emotionally abused: Not on file    Physically abused: Not on file    Forced sexual activity: Not on file  Other Topics Concern  . Not on file  Social History Narrative  . Not on file    FAMILY HISTORY:   Family Status  Relation Name Status  . Mother  Deceased  . Father  Deceased  . Sister  Alive  . Son x3 Alive    ROS:  Review of Systems  Constitutional: Negative.   HENT: Negative.   Eyes: Negative.   Respiratory: Negative.   Cardiovascular: Negative.   Genitourinary: Negative.   Musculoskeletal: Negative.   Skin: Negative.     PHYSICAL EXAMINATION:    VITALS:   Vitals:   11/05/18 1432  BP: (!) 165/87  Pulse: 74  SpO2: 97%  Weight: 282 lb 3.2 oz (128 kg)  Height: 5\' 5"  (1.651 m)    GEN:  The patient appears stated age and is in NAD. HEENT:  Normocephalic, atraumatic.  The mucous membranes are moist. The superficial temporal arteries are without  ropiness or tenderness. CV:  RRR Lungs:  CTAB.  There is doe Neck/HEME:  There are no carotid bruits bilaterally.  Neurological examination:  Orientation: The patient is alert and oriented x3. Cranial nerves: There is good facial symmetry with mild facial hypomimia. The speech is fluent and clear. Soft palate rises symmetrically and there is no tongue deviation. Hearing is intact to conversational tone. Sensation: Sensation is intact to light touch throughout Motor: Strength is at least antigravity x4.  Movement examination: Tone: There is mild to mod increased tone in the RUE/RLE Abnormal movements: there is RUE  rest tremor Coordination:  There is mild decremation with RAM's, with any form of RAMS, including alternating supination and pronation of the forearm, hand opening and closing, finger taps, heel taps and toe taps on the right Gait and Station: The patient has no difficulty arising out of a deep-seated chair without the use of the hands. The patient's stride length is good with forward flexed posture.     ASSESSMENT/PLAN:  1.  Parkinson's disease  -Have watched her for an atypical state given pseudobulbar speech, but have not really seen that progress.  Likely idiopathic Parkinson's disease.    -increase carbidopa/levodopa 25/100, 2/2/1/1.  Risks, benefits, side effects and alternative therapies were discussed.  The opportunity to ask questions was given and they were answered to the best of my ability.  The patient expressed understanding and willingness to follow the outlined treatment protocols.  -Refuses dopamine agonist.  -refuses PT for now.  Will let me know if changes mind 2.  REM behavior disorder  -Does not want any medication.  Safety discussed. 3.  Urinary frequency  -Has seen [facility] in the past. 4.  Follow up is anticipated in the next 4-6 months, sooner should new neurologic issues arise.  Much greater than 50% of this visit was spent in counseling and  coordinating care.  Total face to face time:  25 min  Cc:  Mila PalmerWolters, Sharon, MD

## 2018-11-04 NOTE — Telephone Encounter (Signed)
Routed to PA/CMA and CVRR to review side effects

## 2018-11-05 ENCOUNTER — Ambulatory Visit (INDEPENDENT_AMBULATORY_CARE_PROVIDER_SITE_OTHER): Payer: Medicare Other | Admitting: Neurology

## 2018-11-05 ENCOUNTER — Encounter: Payer: Self-pay | Admitting: Neurology

## 2018-11-05 ENCOUNTER — Other Ambulatory Visit: Payer: Self-pay

## 2018-11-05 VITALS — BP 165/87 | HR 74 | Ht 65.0 in | Wt 282.2 lb

## 2018-11-05 DIAGNOSIS — G20 Parkinson's disease: Secondary | ICD-10-CM | POA: Diagnosis not present

## 2018-11-05 MED ORDER — CARBIDOPA-LEVODOPA 25-100 MG PO TABS: ORAL_TABLET | ORAL | 1 refills | Status: DC

## 2018-11-05 NOTE — Patient Instructions (Addendum)
1.  Increase carbidopa/levodopa 25/100, 2 tablets at 8am/2 tablets at noon/1 tablet at 4 pm/1 tablet at 8pm  2.  Let me know if you want a referral to PT for parkinsons disease/balance.  The physicians and staff at [facility] are committed to providing excellent care. You may receive a survey requesting feedback about your experience at our office. We strive to receive "very good" responses to the survey questions. If you feel that your experience would prevent you from giving the office a "very good " response, please contact our office to try to remedy the situation. We may be reached at [phone]. Thank you for taking the time out of your busy day to complete the survey.

## 2018-11-17 ENCOUNTER — Encounter: Payer: Self-pay | Admitting: Cardiology

## 2018-11-17 ENCOUNTER — Other Ambulatory Visit: Payer: Self-pay

## 2018-11-17 ENCOUNTER — Ambulatory Visit (INDEPENDENT_AMBULATORY_CARE_PROVIDER_SITE_OTHER): Payer: Medicare Other | Admitting: Cardiology

## 2018-11-17 VITALS — BP 140/80 | HR 69 | Temp 97.4°F | Ht 65.0 in | Wt 281.0 lb

## 2018-11-17 DIAGNOSIS — R600 Localized edema: Secondary | ICD-10-CM | POA: Diagnosis not present

## 2018-11-17 DIAGNOSIS — G4733 Obstructive sleep apnea (adult) (pediatric): Secondary | ICD-10-CM

## 2018-11-17 DIAGNOSIS — I350 Nonrheumatic aortic (valve) stenosis: Secondary | ICD-10-CM

## 2018-11-17 DIAGNOSIS — G20 Parkinson's disease: Secondary | ICD-10-CM

## 2018-11-17 NOTE — Assessment & Plan Note (Signed)
Not using C-pap 

## 2018-11-17 NOTE — Patient Instructions (Addendum)
Medication Instructions:  Your physician recommends that you continue on your current medications as directed. Please refer to the Current Medication list given to you today. If you need a refill on your cardiac medications before your next appointment, please call your pharmacy.   Lab work: Your physician recommends that you return for lab work in: TODAY-BMET, MAG  If you have labs (blood work) drawn today and your tests are completely normal, you will receive your results only by: Marland Kitchen MyChart Message (if you have MyChart) OR . A paper copy in the mail If you have any lab test that is abnormal or we need to change your treatment, we will call you to review the results.  Testing/Procedures: NONE   Follow-Up: At [facility], you and your health needs are our priority.  As part of our continuing mission to provide you with exceptional heart care, we have created designated Provider Care Teams.  These Care Teams include your primary Cardiologist (physician) and Advanced Practice Providers (APPs -  Physician Assistants and Nurse Practitioners) who all work together to provide you with the care you need, when you need it. You will need a follow up appointment in 2-3 months. You may see Kirk Ruths, MD or one of the following Advanced Practice Providers on your designated Care Team:   Kerin Ransom, PA-C Roby Lofts, [place] . Sande Rives, PA-C  Any Other Special Instructions Will Be Listed Below (If Applicable).

## 2018-11-17 NOTE — Assessment & Plan Note (Signed)
Pt didn't tolerate lasix well-she says it caused confusion and she didn't like the frequent urination. Today she tells me the edema is OK.

## 2018-11-17 NOTE — Assessment & Plan Note (Addendum)
Hyperdynamic EF and increased gradients on echo 07/16/2018. No chest pain or DOE but she is not very active. She did fall in May 2020 and required shoulder surgery- she is not clear on how she fell.  F/U Dr Stanford Breed in Nov

## 2018-11-17 NOTE — Assessment & Plan Note (Signed)
Followed by Dr. Tat. 

## 2018-11-17 NOTE — Progress Notes (Signed)
Cardiology Office Note:    Date:  11/17/2018   ID:  Legacie, Detore 04-15-1950, MRN [id number]  PCP:  Mila Palmer, MD  Cardiologist:  [name], MD  Electrophysiologist:  None   Referring MD: Mila Palmer, MD   Chief Complaint  Patient presents with  . Follow-up  none  History of Present Illness:    Cindy Richmond is a 68 y.o. female with a hx of Parkinson's, morbid obesity with a BMI of 46, sleep apnea (not compliant with C-pap) hypertension, and moderate aortic stenosis by echocardiogram 07/16/2018.  Her ejection fraction was greater than 65%.  Her peak aortic gradient was 63 mmHg, mean 37.  These gradients were increased compared to her prior echo.   She was seen in July 2020 for LE edema.  She fell in May 2020 and had to have Lt shoulder repair.  She is not clear on the details of her fall.  She had LE edema post op and was discharged on a course of diuretics.   He edema improved but then recurred when the diuretics ran out.  She was seen in the office 7/24/200 and her diuretics adjusted.  She was seen back 8/25 for follow up.  She apparently was confused and never took the diuretic dose as prescribed 7/24.  Not suprisingly her edema did not improve.    I again clearly outlined a diuretic dose for her.  She took this for a few days then called in saying she could not tolerate it.  She had confusion and frequent urination.  She read online that these were side effects of Lasix.  She also read she could have low potasium and low magnesium and she stated taking OTC supplements.    Today she says she feels OK.  No exertional chest pain or unusual dyspnea.  She says her edema is stable- she does not want to take a diuretic.   Past Medical History:  Diagnosis Date  . Aortic stenosis   . Dyspnea    with head low, sleep apnea  . GERD (gastroesophageal reflux disease)   . H/O hiatal hernia   . Heart murmur    asymptomatic  . Hematuria   . HOH (hard of hearing)    sightly,  bilaterally  . Hyperlipidemia   . Hypertension   . Hypothyroidism   . Kidney stone on left side   . Parkinson disease [facility])     Past Surgical History:  Procedure Laterality Date  . ABDOMINAL HYSTERECTOMY     lso  . CYSTOSCOPY W/ RETROGRADES  02/28/2012   Procedure: CYSTOSCOPY WITH RETROGRADE PYELOGRAM;  Surgeon: Sebastian Ache, MD;  Location: [facility];  Service: Urology;  Laterality: Right;  . CYSTOSCOPY WITH STENT PLACEMENT  02/28/2012   Procedure: CYSTOSCOPY WITH STENT PLACEMENT;  Surgeon: Sebastian Ache, MD;  Location[facility];  Service: Urology;  Laterality: Left;  . CYSTOSCOPY/RETROGRADE/URETEROSCOPY/STONE EXTRACTION WITH BASKET  02/28/2012   Procedure: CYSTOSCOPY/RETROGRADE/URETEROSCOPY/STONE EXTRACTION WITH BASKET;  Surgeon: Sebastian Ache, MD;  Location: [facility];  Service: Urology;  Laterality: Left;  . HOLMIUM LASER APPLICATION  02/28/2012   Procedure: HOLMIUM LASER APPLICATION;  Surgeon: Sebastian Ache, MD;  Location: [facility];  Service: Urology;  Laterality: Left;  . KNEE ARTHROSCOPY     right  . REVERSE SHOULDER ARTHROPLASTY Left 07/22/2018   Procedure: REVERSE SHOULDER ARTHROPLASTY;  Surgeon: Bjorn Pippin, MD;  Location: [facility];  Service: Orthopedics;  Laterality: Left;  .  TONSILLECTOMY    . VEIN LIGATION      Current Medications: Current Meds  Medication Sig  . bisoprolol-hydrochlorothiazide (ZIAC) 5-6.25 MG tablet Take 1 tablet by mouth daily.  . carbidopa-levodopa (SINEMET IR) 25-100 MG tablet 2 tablets at 8am/2 tablets at noon/1 tablet at 4 pm/1 tablet at 8pm  . Cholecalciferol (VITAMIN D3 PO) Take 50 mcg by mouth.  . esomeprazole (NEXIUM) 20 MG capsule Take 20 mg by mouth daily.   Marland Kitchen levothyroxine (SYNTHROID, LEVOTHROID) 75 MCG tablet Take 75 mcg by mouth daily.  . Magnesium 250 MG TABS Take 1 tablet by mouth daily.  . Omega-3 Fatty Acids (OMEGA 3 PO) Take by mouth.  Bertram Gala Glycol-Propyl  Glycol (SYSTANE OP) Place 1 drop into both eyes daily as needed (dry eyes).     Allergies:   Penicillins   Social History   Socioeconomic History  . Marital status: Widowed    Spouse name: Not on file  . Number of children: 3  . Years of education: Not on file  . Highest education level: Associate degree: occupational, Scientist, product/process development, or vocational program  Occupational History  . 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 Financial resource strain: Not on file  . Food insecurity    Worry: Not on file    Inability: Not on file  . Transportation needs    Medical: Not on file    Non-medical: Not on file  Tobacco Use  . Smoking status: Never Smoker  . Smokeless tobacco: Never Used  Substance and Sexual Activity  . Alcohol use: Never    Frequency: Never  . Drug use: Never  . Sexual activity: Not on file  Lifestyle  . Physical activity    Days per week: Not on file    Minutes per session: Not on file  . Stress: Not on file  Relationships  . Social Musician on phone: Not on file    Gets together: Not on file    Attends religious service: Not on file    Active member of club or organization: Not on file    Attends meetings of clubs or organizations: Not on file    Relationship status: Not on file  Other Topics Concern  . Not on file  Social History Narrative  . Not on file     Family History: The patient's family history includes CAD in her mother; Healthy in her sister and son; Heart failure in her father.  ROS:   Please see the history of present illness.     All other systems reviewed and are negative.  EKGs/Labs/Other Studies Reviewed:    The following studies were reviewed today: Echo May 2020  Recent Labs: 07/17/2018: Hemoglobin 15.3; Platelets 193 08/26/2018: BUN 12; Creatinine, Ser 1.11; Potassium 4.1; Sodium 143  Recent Lipid Panel No results found for: CHOL, TRIG, HDL, CHOLHDL, VLDL, LDLCALC, LDLDIRECT  Physical Exam:     VS:  BP 140/80   Pulse 69   Temp (!) 97.4 F (36.3 C) (Temporal)   Ht 5\' 5"  (1.651 m)   Wt 281 lb (127.5 kg)   SpO2 96%   BMI 46.76 kg/m     Wt Readings from Last 3 Encounters:  11/17/18 281 lb (127.5 kg)  11/05/18 282 lb 3.2 oz (128 kg)  10/20/18 281 lb 9.6 oz (127.7 kg)     GEN: Obese Caucasian female in no acute distress HEENT: Normal NECK: No JVD; No carotid bruits  LYMPHATICS: No lymphadenopathy CARDIAC: RRR, 2/6 systolic murmur AOV, LSB, preserved S2, no rubs or gallops RESPIRATORY:  Clear to auscultation without rales, wheezing or rhonchi  ABDOMEN: Soft, non-tender, non-distended MUSCULOSKELETAL:  Trace edema. No deformity  SKIN: Warm and dry NEUROLOGIC:  Alert and oriented x 3, resting tremor PSYCHIATRIC:  Normal affect   ASSESSMENT:    Edema of both legs Pt didn't tolerate lasix well-she says it caused confusion and she didn't like the frequent urination. Today she tells me the edema is OK.  Aortic stenosis, moderate to severe Hyperdynamic EF and increased gradients on echo 07/16/2018. No chest pain or DOE but she is not very active. She did fall in May 2020 and required shoulder surgery- she is not clear on how she fell.  F/U Dr Stanford Breed in Nov  Obesity, morbid ([place]) BMI 46  Obstructive sleep apnea Not using C-pap  Parkinson's disease ([place]) Followed by Dr Tat  PLAN:    Check BMP and Mg++.  F/U with Dr Stanford Breed in Nov (see echo note).     Medication Adjustments/Labs and Tests Ordered: Current medicines are reviewed at length with the patient today.  Concerns regarding medicines are outlined above.  Orders Placed This Encounter  Procedures  . Basic Metabolic Panel (BMET)  . Magnesium   No orders of the defined types were placed in this encounter.   Patient Instructions  Medication Instructions:  Your physician recommends that you continue on your current medications as directed. Please refer to the Current Medication list given to you  today. If you need a refill on your cardiac medications before your next appointment, please call your pharmacy.   Lab work: Your physician recommends that you return for lab work in: TODAY-BMET, MAG  If you have labs (blood work) drawn today and your tests are completely normal, you will receive your results only by: Marland Kitchen MyChart Message (if you have MyChart) OR . A paper copy in the mail If you have any lab test that is abnormal or we need to change your treatment, we will call you to review the results.  Testing/Procedures: NONE   Follow-Up: At [facility]: [facility], you and your health needs are our priority.  As part of our continuing mission to provide you with exceptional heart care, we have created designated Provider Care Teams.  These Care Teams include your primary Cardiologist (physician) and Advanced Practice Providers (APPs -  Physician Assistants and Nurse Practitioners) who all work together to provide you with the care you need, when you need it. You will need a follow up appointment in 2-3 months. You may see Kirk Ruths, MD or one of the following Advanced Practice Providers on your designated Care Team:   Kerin Ransom, PA-C Roby Lofts, [place] . Sande Rives, PA-C  Any Other Special Instructions Will Be Listed Below (If Applicable).      Signed, Kerin Ransom, PA-C  11/17/2018 3:28 PM    [facility]

## 2018-11-17 NOTE — Assessment & Plan Note (Signed)
BMI 46 ?

## 2018-11-18 LAB — BASIC METABOLIC PANEL
BUN/Creatinine Ratio: 19 (ref 12–28)
BUN: 15 mg/dL (ref 8–27)
CO2: 25 mmol/L (ref 20–29)
Calcium: 9.3 mg/dL (ref 8.7–10.3)
Chloride: 102 mmol/L (ref 96–106)
Creatinine, Ser: 0.78 mg/dL (ref 0.57–1.00)
GFR calc Af Amer: 89 mL/min/{1.73_m2} (ref >59)
GFR calc non Af Amer: 77 mL/min/{1.73_m2} (ref >59)
Glucose: 81 mg/dL (ref 65–99)
Potassium: 4.3 mmol/L (ref 3.5–5.2)
Sodium: 142 mmol/L (ref 134–144)

## 2018-11-18 LAB — MAGNESIUM: Magnesium: 2.2 mg/dL (ref 1.6–2.3)

## 2019-01-06 ENCOUNTER — Ambulatory Visit
Admission: RE | Admit: 2019-01-06 | Discharge: 2019-01-06 | Disposition: A | Discharge status: Home / Self Care | Payer: Medicare Other | Source: Ambulatory Visit | Attending: Family Medicine | Admitting: Family Medicine

## 2019-01-06 ENCOUNTER — Other Ambulatory Visit: Payer: Self-pay

## 2019-01-06 DIAGNOSIS — Z1231 Encounter for screening mammogram for malignant neoplasm of breast: Secondary | ICD-10-CM

## 2019-01-06 DIAGNOSIS — E2839 Other primary ovarian failure: Secondary | ICD-10-CM

## 2019-01-25 NOTE — Progress Notes (Signed)
HPI: FUAS. Echo 11/17 showed normal LV function, moderate LVH, moderate AS with mean gradient 24 mmHg and mild RAE. Echocardiogram May 2020 showed normal LV function, moderate aortic stenosis with mean gradient 37 mmHg.  Has been treated for lower extremity edema with diuretics previously.  Since last seen,she has some dyspnea on exertion unchanged.  No orthopnea, PND, chest pain or syncope.  Chronic mild pedal edema.  Current Outpatient Medications  Medication Sig Dispense Refill  . bisoprolol-hydrochlorothiazide (ZIAC) 5-6.25 MG tablet Take 1 tablet by mouth daily.    . carbidopa-levodopa (SINEMET IR) 25-100 MG tablet 2 tablets at 8am/2 tablets at noon/1 tablet at 4 pm/1 tablet at 8pm 540 tablet 1  . Cholecalciferol (VITAMIN D3 PO) Take 50 mcg by mouth.    . esomeprazole (NEXIUM) 20 MG capsule Take 20 mg by mouth daily.     Marland Kitchen levothyroxine (SYNTHROID, LEVOTHROID) 75 MCG tablet Take 75 mcg by mouth daily.    . Magnesium 250 MG TABS Take 1 tablet by mouth daily.    . Omega-3 Fatty Acids (OMEGA 3 PO) Take by mouth.    Vladimir Faster Glycol-Propyl Glycol (SYSTANE OP) Place 1 drop into both eyes daily as needed (dry eyes).     No current facility-administered medications for this visit.      Past Medical History:  Diagnosis Date  . Aortic stenosis   . Dyspnea    with head low, sleep apnea  . GERD (gastroesophageal reflux disease)   . H/O hiatal hernia   . Heart murmur    asymptomatic  . Hematuria   . HOH (hard of hearing)    sightly, bilaterally  . Hyperlipidemia   . Hypertension   . Hypothyroidism   . Kidney stone on left side   . Parkinson disease [facility])     Past Surgical History:  Procedure Laterality Date  . ABDOMINAL HYSTERECTOMY     lso  . CYSTOSCOPY W/ RETROGRADES  02/28/2012   Procedure: CYSTOSCOPY WITH RETROGRADE PYELOGRAM;  Surgeon: Alexis Frock, MD;  [facility];  Service: Urology;  Laterality: Right;  . CYSTOSCOPY WITH STENT  PLACEMENT  02/28/2012   Procedure: CYSTOSCOPY WITH STENT PLACEMENT;  Surgeon: Alexis Frock, MD;  Location: [facility];  Service: Urology;  Laterality: Left;  . CYSTOSCOPY/RETROGRADE/URETEROSCOPY/STONE EXTRACTION WITH BASKET  02/28/2012   Procedure: CYSTOSCOPY/RETROGRADE/URETEROSCOPY/STONE EXTRACTION WITH BASKET;  Surgeon: Alexis Frock, MD;  Location: [facility];  Service: Urology;  Laterality: Left;  . HOLMIUM LASER APPLICATION  04/30/91   Procedure: HOLMIUM LASER APPLICATION;  Surgeon: Alexis Frock, MD;  Location: [facility];  Service: Urology;  Laterality: Left;  . KNEE ARTHROSCOPY     right  . REVERSE SHOULDER ARTHROPLASTY Left 07/22/2018   Procedure: REVERSE SHOULDER ARTHROPLASTY;  Surgeon: Hiram Gash, MD;  Location: [facility];  Service: Orthopedics;  Laterality: Left;  . TONSILLECTOMY    . VEIN LIGATION      Social History   Socioeconomic History  . Marital status: Widowed    Spouse name: Not on file  . Number of children: 3  . Years of education: Not on file  . Highest education level: Associate degree: occupational, Hotel manager, or vocational program  Occupational History  . 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 Financial resource strain: Not on file  . Food insecurity    Worry: Not on file    Inability: Not on file  . Transportation needs  Medical: Not on file    Non-medical: Not on file  Tobacco Use  . Smoking status: Never Smoker  . Smokeless tobacco: Never Used  Substance and Sexual Activity  . Alcohol use: Never    Frequency: Never  . Drug use: Never  . Sexual activity: Not on file  Lifestyle  . Physical activity    Days per week: Not on file    Minutes per session: Not on file  . Stress: Not on file  Relationships  . Social Musician on phone: Not on file    Gets together: Not on file    Attends religious service: Not on file    Active member of club or organization: Not on  file    Attends meetings of clubs or organizations: Not on file    Relationship status: Not on file  . Intimate partner violence    Fear of current or ex partner: Not on file    Emotionally abused: Not on file    Physically abused: Not on file    Forced sexual activity: Not on file  Other Topics Concern  . Not on file  Social History Narrative  . Not on file    Family History  Problem Relation Age of Onset  . CAD Mother   . Heart failure Father   . Healthy Sister   . Healthy Son     ROS: no fevers or chills, productive cough, hemoptysis, dysphasia, odynophagia, melena, hematochezia, dysuria, hematuria, rash, seizure activity, orthopnea, PND, claudication. Remaining systems are negative.  Physical Exam: Well-developed obese in no acute distress.  Skin is warm and dry.  HEENT is normal.  Neck is supple.  Chest is clear to auscultation with normal expansion.  Cardiovascular exam is regular rate and rhythm.  2/6 systolic murmur left sternal border. Abdominal exam nontender or distended. No masses palpated. Extremities show trace edema. neuro grossly intact  A/P  1 aortic stenosis-moderate on most recent echocardiogram.  She understands the symptoms to be aware of including dyspnea, chest pain and syncope.  We will plan to repeat her echocardiogram in May.  She understands she will likely require aortic valve replacement in the future.  2 hypertension-blood pressure elevated.  She states it is typically elevated at physicians offices.  I have asked her to track this and we will advance regimen as needed.  3 dyspnea-unchanged.  This is felt to be multifactorial including deconditioning, obesity hypoventilation syndrome and sleep apnea.  4 morbid obesity-we discussed the importance of diet, exercise and weight loss.  [name], MD

## 2019-02-01 ENCOUNTER — Encounter: Payer: Self-pay | Admitting: Cardiology

## 2019-02-01 ENCOUNTER — Ambulatory Visit (INDEPENDENT_AMBULATORY_CARE_PROVIDER_SITE_OTHER): Payer: Medicare Other | Admitting: Cardiology

## 2019-02-01 ENCOUNTER — Other Ambulatory Visit: Payer: Self-pay

## 2019-02-01 VITALS — BP 148/94 | HR 71 | Temp 97.5°F | Ht 65.0 in | Wt 284.0 lb

## 2019-02-01 DIAGNOSIS — I10 Essential (primary) hypertension: Secondary | ICD-10-CM

## 2019-02-01 DIAGNOSIS — I350 Nonrheumatic aortic (valve) stenosis: Secondary | ICD-10-CM | POA: Diagnosis not present

## 2019-02-01 DIAGNOSIS — Z23 Encounter for immunization: Secondary | ICD-10-CM | POA: Diagnosis not present

## 2019-02-01 NOTE — Patient Instructions (Signed)
Medication Instructions:  NO CHANGE *If you need a refill on your cardiac medications before your next appointment, please call your pharmacy*  Lab Work: If you have labs (blood work) drawn today and your tests are completely normal, you will receive your results only by: Marland Kitchen MyChart Message (if you have MyChart) OR . A paper copy in the mail If you have any lab test that is abnormal or we need to change your treatment, we will call you to review the results.  Testing/Procedures: Your physician has requested that you have an echocardiogram. Echocardiography is a painless test that uses sound waves to create images of your heart. It provides your doctor with information about the size and shape of your heart and how well your heart's chambers and valves are working. This procedure takes approximately one hour. There are no restrictions for this procedure.[place] MAY 2021    Follow-Up: At [facility], you and your health needs are our priority.  As part of our continuing mission to provide you with exceptional heart care, we have created designated Provider Care Teams.  These Care Teams include your primary Cardiologist (physician) and Advanced Practice Providers (APPs -  Physician Assistants and Nurse Practitioners) who all work together to provide you with the care you need, when you need it.  Your next appointment:   6 month(s)  The format for your next appointment:   Either In Person or Virtual  Provider:   You may see Kirk Ruths, MD or one of the following Advanced Practice Providers on your designated Care Team:    Kerin Ransom, PA-C  [place], [place]  Coletta Memos, [place]

## 2019-04-05 ENCOUNTER — Ambulatory Visit: Payer: Medicare PPO | Attending: Internal Medicine

## 2019-04-05 DIAGNOSIS — Z23 Encounter for immunization: Secondary | ICD-10-CM

## 2019-04-05 NOTE — Progress Notes (Signed)
   Covid-19 Vaccination Clinic  Name:  Cindy Richmond    MRN: [id number] DOB: [birth year]  04/05/2019  [name] was observed post Covid-19 immunization for 15 minutes without incidence. She was provided with Vaccine Information Sheet and instruction to access the V-Safe system.   [name] was instructed to call 911 with any severe reactions post vaccine: Marland Kitchen Difficulty breathing  . Swelling of your face and throat  . A fast heartbeat  . A bad rash all over your body  . Dizziness and weakness    Immunizations Administered    Name Date Dose VIS Date Route   Pfizer COVID-19 Vaccine 04/05/2019  5:43 PM 0.3 mL 02/05/2019 Intramuscular   Manufacturer: [employer], [employer]   Lot: [id number]   NDC: [address]-7

## 2019-04-07 ENCOUNTER — Encounter: Payer: Self-pay | Admitting: Neurology

## 2019-04-07 NOTE — Progress Notes (Signed)
Virtual Visit via Video Note The purpose of this virtual visit is to provide medical care while limiting exposure to the novel coronavirus.    Consent was obtained for video visit:  Yes.   Answered questions that patient had about telehealth interaction:  Yes.   I discussed the limitations, risks, security and privacy concerns of performing an evaluation and management service by telemedicine. I also discussed with the patient that there may be a patient responsible charge related to this service. The patient expressed understanding and agreed to proceed.  Pt location: Home Physician Location: office Name of referring provider:  Jonathon Jordan, MD I connected with Cindy Richmond at patients initiation/request on 04/08/2019 at  3:30 PM EST by video enabled telemedicine application and verified that I am speaking with the correct person using two identifiers. Pt MRN:  [id number] Pt DOB:  1950/06/04 Video Participants:  Chaka Kandis Nab;     History of Present Illness:  Patient seen today in follow-up for Parkinson's disease.  My previous records as well as any outside records made available were reviewed prior to todays visit.  Medication was increased last visit.  She reports that she tried to back down on the medication b/c she thought that it was making her sleepy but the cramping/spring like pain came back in her leg.  She is sleeping much better at night.   pt denies falls.  Pt denies lightheadedness, near syncope.  No hallucinations.  Mood has been good.  Cardiology records are reviewed from Dr. Stanford Breed.  Last saw him in December.  No medication changes were made.  He is following her blood pressure, which was elevated in his office.  She states that it has been good at home and in fact, has been low lately. No symptoms.    Current movement d/o meds: carbidopa/levodopa 25/100, 2 tablets at 8am/2 tablets at noon/1 tablet at 4 pm/1 tablet at 8pm (increased last visit)  Prior meds:   carbidopa/levodopa 25/100   Current Outpatient Medications on File Prior to Visit  Medication Sig Dispense Refill  . bisoprolol-hydrochlorothiazide (ZIAC) 5-6.25 MG tablet Take 1 tablet by mouth daily.    . carbidopa-levodopa (SINEMET IR) 25-100 MG tablet 2 tablets at 8am/2 tablets at noon/1 tablet at 4 pm/1 tablet at 8pm 540 tablet 1  . Cholecalciferol (VITAMIN D3 PO) Take 50 mcg by mouth.    . esomeprazole (NEXIUM) 20 MG capsule Take 20 mg by mouth daily.     Marland Kitchen levothyroxine (SYNTHROID, LEVOTHROID) 75 MCG tablet Take 75 mcg by mouth daily.    . Magnesium 250 MG TABS Take 1 tablet by mouth daily.    . Omega-3 Fatty Acids (OMEGA 3 PO) Take by mouth.    Vladimir Faster Glycol-Propyl Glycol (SYSTANE OP) Place 1 drop into both eyes daily as needed (dry eyes).     No current facility-administered medications on file prior to visit.     Observations/Objective:   Vitals:   04/08/19 1536  BP: 108/67  Pulse: 67   GEN:  The patient appears stated age and is in NAD.  Neurological examination:  Orientation: The patient is alert and oriented x3. Cranial nerves: There is good facial symmetry. There is mild facial hypomimia.  The speech is fluent and clear. Soft palate rises symmetrically and there is no tongue deviation. Hearing is intact to conversational tone. Motor: Strength is at least antigravity x 4.   Shoulder shrug is equal and symmetric.  There is no pronator drift.  Movement  examination: Tone: unable Abnormal movements: none Coordination:  There is  decremation with RAM's, with finger taps bilaterally    Assessment and Plan:   1.  Parkinson's disease  -She will continue carbidopa/levodopa 25/100, 2 tablets at 8am/2 tablets at noon/1 tablet at 4 pm/1 tablet at 8pm.  Discussed changing to CR to see if would help EDS but pt feels comfortable with this version and would like to stay with it for now.  Will let me know when needs RF  -Declines dopamine agonist  -Encouraged exercise.   Declines physical therapy  -We discussed that it used to be thought that levodopa would increase risk of melanoma but now it is believed that Parkinsons itself likely increases risk of melanoma. she is to get regular skin checks.  2.  Urinary frequency  -Has seen [facility] in the past.  3.  Aortic stenosis  -Following with cardiology  4.  Hypertension  -Following with cardiology.  Last home BP are low but pt asymptommatic.  Will monitor   Follow Up Instructions:  5 months  -I discussed the assessment and treatment plan with the patient. The patient was provided an opportunity to ask questions and all were answered. The patient agreed with the plan and demonstrated an understanding of the instructions.   The patient was advised to call back or seek an in-person evaluation if the symptoms worsen or if the condition fails to improve as anticipated.      Kerin Salen, DO

## 2019-04-08 ENCOUNTER — Other Ambulatory Visit: Payer: Self-pay

## 2019-04-08 ENCOUNTER — Telehealth (INDEPENDENT_AMBULATORY_CARE_PROVIDER_SITE_OTHER): Payer: Medicare PPO | Admitting: Neurology

## 2019-04-08 ENCOUNTER — Encounter: Payer: Self-pay | Admitting: Neurology

## 2019-04-08 VITALS — BP 108/67 | HR 67

## 2019-04-08 DIAGNOSIS — G20 Parkinson's disease: Secondary | ICD-10-CM

## 2019-04-30 ENCOUNTER — Ambulatory Visit: Payer: Medicare PPO | Attending: Internal Medicine

## 2019-04-30 DIAGNOSIS — Z23 Encounter for immunization: Secondary | ICD-10-CM | POA: Insufficient documentation

## 2019-04-30 NOTE — Progress Notes (Signed)
   Covid-19 Vaccination Clinic  Name:  Cindy Richmond    MRN: [id number] DOB: 02-Aug-1950  04/30/2019  Cindy Richmond was observed post Covid-19 immunization for 15 minutes without incident. She was provided with Vaccine Information Sheet and instruction to access the V-Safe system.   Cindy Richmond was instructed to call 911 with any severe reactions post vaccine: Marland Kitchen Difficulty breathing  . Swelling of face and throat  . A fast heartbeat  . A bad rash all over body  . Dizziness and weakness   Immunizations Administered    Name Date Dose VIS Date Route   Pfizer COVID-19 Vaccine 04/30/2019  4:28 PM 0.3 mL 02/05/2019 Intramuscular   Manufacturer: [employer], [employer]   Lot: [id number]   NDC: [address]-8

## 2019-05-04 ENCOUNTER — Telehealth: Payer: Self-pay | Admitting: *Deleted

## 2019-05-04 DIAGNOSIS — H349 Unspecified retinal vascular occlusion: Secondary | ICD-10-CM

## 2019-05-04 NOTE — Telephone Encounter (Signed)
Dr Jens Som discussed patient with dr patel, the patient has an retina artery occlusion and will need a stoke workup. Patient will be scheduled for echo, carotid dopplers and office visit.

## 2019-05-05 NOTE — Telephone Encounter (Signed)
Patient states she is calling to follow up in regards to retina artery occlusion. I informed patient of soonest available appoinmtent times for echo, however she states she would like to inquire about whether or not she may be seen sooner. Please advise.

## 2019-05-05 NOTE — Telephone Encounter (Signed)
Spoke with pt, she agrees to have the testing done in the [facility] for sooner appointment times. Aware scheduling is closed at present but will get things arranged for her tomorrow. Pt agreed with this plan.

## 2019-05-06 NOTE — Telephone Encounter (Signed)
Spoke with pt, aware carotid and echo scheduled for 05-11-19 @ 2:15 pm and 3:15 pm at [facility]. Follow up scheduled

## 2019-05-09 ENCOUNTER — Other Ambulatory Visit: Payer: Self-pay | Admitting: Neurology

## 2019-05-11 ENCOUNTER — Ambulatory Visit (HOSPITAL_BASED_OUTPATIENT_CLINIC_OR_DEPARTMENT_OTHER)
Admission: RE | Admit: 2019-05-11 | Discharge: 2019-05-11 | Disposition: A | Discharge status: Home / Self Care | Payer: Medicare PPO | Source: Ambulatory Visit | Attending: Cardiology | Admitting: Cardiology

## 2019-05-11 ENCOUNTER — Other Ambulatory Visit: Payer: Self-pay

## 2019-05-11 DIAGNOSIS — I350 Nonrheumatic aortic (valve) stenosis: Secondary | ICD-10-CM

## 2019-05-11 DIAGNOSIS — H349 Unspecified retinal vascular occlusion: Secondary | ICD-10-CM | POA: Insufficient documentation

## 2019-05-11 NOTE — Progress Notes (Signed)
  Echocardiogram 2D Echocardiogram has been performed.  Sinda Du 05/11/2019, 3:48 PM

## 2019-05-11 NOTE — Progress Notes (Signed)
  Echocardiogram 2D Echocardiogram has been performed.  Cindy Richmond 05/11/2019, 3:32 PM

## 2019-05-13 NOTE — Progress Notes (Signed)
HPI: FUAS. Has been treated for lower extremity edema with diuretics previously.  Echocardiogram March 2021 showed normal LV function, moderate left ventricular hypertrophy, severe aortic stenosis with mean gradient 42 mmHg.   Carotid Dopplers March 2021 showed no significant stenosis.  Since last seen,she continues to have significant dyspnea on exertion.  She denies orthopnea, PND, increased pedal edema, chest pain or syncope.  Current Outpatient Medications  Medication Sig Dispense Refill  . bisoprolol-hydrochlorothiazide (ZIAC) 5-6.25 MG tablet Take 1 tablet by mouth daily.    . brimonidine (ALPHAGAN) 0.15 % ophthalmic solution     . carbidopa-levodopa (SINEMET IR) 25-100 MG tablet 2 TABLETS AT 8AM/2 TABLETS AT NOON/1 TABLET AT 4 PM/1 TABLET AT 8PM 540 tablet 1  . Cholecalciferol (VITAMIN D3 PO) Take 50 mcg by mouth.    . esomeprazole (NEXIUM) 20 MG capsule Take 20 mg by mouth daily.     Marland Kitchen levothyroxine (SYNTHROID, LEVOTHROID) 75 MCG tablet Take 75 mcg by mouth daily.    . Magnesium 250 MG TABS Take 1 tablet by mouth daily.    . Multiple Vitamins-Minerals (PRESERVISION AREDS PO) Take 1 tablet by mouth as directed.    . Omega-3 Fatty Acids (OMEGA 3 PO) Take by mouth.    Vladimir Faster Glycol-Propyl Glycol (SYSTANE OP) Place 1 drop into both eyes daily as needed (dry eyes).     No current facility-administered medications for this visit.     Past Medical History:  Diagnosis Date  . Aortic stenosis   . Dyspnea    with head low, sleep apnea  . GERD (gastroesophageal reflux disease)   . H/O hiatal hernia   . Heart murmur    asymptomatic  . Hematuria   . HOH (hard of hearing)    sightly, bilaterally  . Hyperlipidemia   . Hypertension   . Hypothyroidism   . Kidney stone on left side   . Parkinson disease [facility])     Past Surgical History:  Procedure Laterality Date  . ABDOMINAL HYSTERECTOMY     lso  . CYSTOSCOPY W/ RETROGRADES  02/28/2012   Procedure: CYSTOSCOPY WITH  RETROGRADE PYELOGRAM;  Surgeon: Alexis Frock, MD;  Location: [facility];  Service: Urology;  Laterality: Right;  . CYSTOSCOPY WITH STENT PLACEMENT  02/28/2012   Procedure: CYSTOSCOPY WITH STENT PLACEMENT;  Surgeon: Alexis Frock, MD;  Location: [facility];  Service: Urology;  Laterality: Left;  . CYSTOSCOPY/RETROGRADE/URETEROSCOPY/STONE EXTRACTION WITH BASKET  02/28/2012   Procedure: CYSTOSCOPY/RETROGRADE/URETEROSCOPY/STONE EXTRACTION WITH BASKET;  Surgeon: Alexis Frock, MD;  Location: [facility];  Service: Urology;  Laterality: Left;  . HOLMIUM LASER APPLICATION  0/10/8117   Procedure: HOLMIUM LASER APPLICATION;  Surgeon: Alexis Frock, MD;  Location: [facility];  Service: Urology;  Laterality: Left;  . KNEE ARTHROSCOPY     right  . REVERSE SHOULDER ARTHROPLASTY Left 07/22/2018   Procedure: REVERSE SHOULDER ARTHROPLASTY;  Surgeon: Hiram Gash, MD;  Location: [facility];  Service: Orthopedics;  Laterality: Left;  . TONSILLECTOMY    . VEIN LIGATION      Social History   Socioeconomic History  . Marital status: Widowed    Spouse name: Not on file  . Number of children: 3  . Years of education: Not on file  . Highest education level: Associate degree: occupational, Hotel manager, or vocational program  Occupational History  . 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 Smoking status: Never Smoker  .  Smokeless tobacco: Never Used  Substance and Sexual Activity  . Alcohol use: Never  . Drug use: Never  . Sexual activity: Not on file  Other Topics Concern  . Not on file  Social History Narrative  . Not on file   Social Determinants of Health   Financial Resource Strain:   . Difficulty of Paying Living Expenses:   Food Insecurity:   . Worried About Programme researcher, broadcasting/film/video in the Last Year:   . Barista in the Last Year:   Transportation Needs:   . Freight forwarder (Medical):   Marland Kitchen Lack of  Transportation (Non-Medical):   Physical Activity:   . Days of Exercise per Week:   . Minutes of Exercise per Session:   Stress:   . Feeling of Stress :   Social Connections:   . Frequency of Communication with Friends and Family:   . Frequency of Social Gatherings with Friends and Family:   . Attends Religious Services:   . Active Member of Clubs or Organizations:   . Attends Banker Meetings:   Marland Kitchen Marital Status:   Intimate Partner Violence:   . Fear of Current or Ex-Partner:   . Emotionally Abused:   Marland Kitchen Physically Abused:   . Sexually Abused:     Family History  Problem Relation Age of Onset  . CAD Mother   . Heart failure Father   . Healthy Sister   . Healthy Son     ROS: Back pain and leg weakness from Parkinson's but no fevers or chills, productive cough, hemoptysis, dysphasia, odynophagia, melena, hematochezia, dysuria, hematuria, rash, seizure activity, orthopnea, PND, pedal edema, claudication. Remaining systems are negative.  Physical Exam: Well-developed obese in no acute distress.  Skin is warm and dry.  HEENT is normal.  Neck is supple.  Chest is clear to auscultation with normal expansion.  Cardiovascular exam is regular rate and rhythm.  2/6 systolic murmur left sternal border.  S2 is diminished. Abdominal exam nontender or distended. No masses palpated. Extremities show no edema. neuro grossly intact  ECG-normal sinus rhythm at a rate of 74, left ventricular hypertrophy, incomplete right bundle branch block.  Personally reviewed  A/P  1 aortic stenosis-now severe by echocardiogram.  She continues to have dyspnea on exertion which is likely multifactorial.  However aortic stenosis could certainly be contributing.  I discussed proceeding with evaluation for aortic valve replacement (given obesity, baseline diabetes mellitus and other medical issues TAVR would likely be the best option).  However she declines at this point.  She would prefer to  follow for now and discuss this with her son.  I explained the symptoms to be aware of including increasing dyspnea, chest pain and syncope.  I also explained the small risk of sudden death.  She will see [place] back in 3 months to further discuss.  I have asked her to contact us if her symptoms worsen or she changes her mind about proceeding with TAVR now.  2 hypertension-blood pressure controlled.  Continue present medications.  3 morbid obesity-we discussed the importance of diet, exercise and weight loss.  She has lost 20 pounds recently.  4 dyspnea-she continues to have dyspnea on exertion.  It is felt to be multifactorial including obesity hypoventilation syndrome, sleep apnea and deconditioning.  Certainly aortic stenosis could be contributing as well.  [name], MD

## 2019-05-26 ENCOUNTER — Encounter: Payer: Self-pay | Admitting: Cardiology

## 2019-05-26 ENCOUNTER — Other Ambulatory Visit: Payer: Self-pay

## 2019-05-26 ENCOUNTER — Ambulatory Visit: Payer: Medicare PPO | Admitting: Cardiology

## 2019-05-26 VITALS — BP 118/66 | HR 74 | Ht 65.0 in | Wt 275.8 lb

## 2019-05-26 DIAGNOSIS — I10 Essential (primary) hypertension: Secondary | ICD-10-CM

## 2019-05-26 DIAGNOSIS — I350 Nonrheumatic aortic (valve) stenosis: Secondary | ICD-10-CM

## 2019-05-26 NOTE — Patient Instructions (Signed)
Medication Instructions:  NO CHANGE *If you need a refill on your cardiac medications before your next appointment, please call your pharmacy*   Lab Work: If you have labs (blood work) drawn today and your tests are completely normal, you will receive your results only by: . MyChart Message (if you have MyChart) OR . A paper copy in the mail If you have any lab test that is abnormal or we need to change your treatment, we will call you to review the results.   Follow-Up: At [facility], you and your health needs are our priority.  As part of our continuing mission to provide you with exceptional heart care, we have created designated Provider Care Teams.  These Care Teams include your primary Cardiologist (physician) and Advanced Practice Providers (APPs -  Physician Assistants and Nurse Practitioners) who all work together to provide you with the care you need, when you need it.  We recommend signing up for the patient portal called "MyChart".  Sign up information is provided on this After Visit Summary.  MyChart is used to connect with patients for Virtual Visits (Telemedicine).  Patients are able to view lab/test results, encounter notes, upcoming appointments, etc.  Non-urgent messages can be sent to your provider as well.   To learn more about what you can do with MyChart, go to [email or web address].    Your next appointment:   3 month(s)  The format for your next appointment:   In Person  Provider:   Brian Crenshaw, MD    

## 2019-05-27 ENCOUNTER — Telehealth: Payer: Self-pay | Admitting: Cardiology

## 2019-05-27 NOTE — Telephone Encounter (Signed)
New Message:     Pt wants Dr Jens Som to know that she have decided to have heart surgery.

## 2019-05-27 NOTE — Telephone Encounter (Signed)
Please schedule fuov Cindy Richmond

## 2019-05-27 NOTE — Telephone Encounter (Signed)
Routed to MD/RN as FYI 

## 2019-05-28 NOTE — Telephone Encounter (Signed)
Spoke with pt, Follow up scheduled  

## 2019-05-31 DIAGNOSIS — H2513 Age-related nuclear cataract, bilateral: Secondary | ICD-10-CM | POA: Diagnosis not present

## 2019-05-31 DIAGNOSIS — H35033 Hypertensive retinopathy, bilateral: Secondary | ICD-10-CM | POA: Diagnosis not present

## 2019-05-31 DIAGNOSIS — H34232 Retinal artery branch occlusion, left eye: Secondary | ICD-10-CM | POA: Diagnosis not present

## 2019-05-31 DIAGNOSIS — H43821 Vitreomacular adhesion, right eye: Secondary | ICD-10-CM | POA: Diagnosis not present

## 2019-06-23 NOTE — H&P (View-Only) (Signed)
 Virtual Visit via Video Note changed to phone visit at patient request and technical difficulties.   This visit type was conducted due to national recommendations for restrictions regarding the COVID-19 Pandemic (e.g. social distancing) in an effort to limit this patient's exposure and mitigate transmission in our community.  Due to her co-morbid illnesses, this patient is at least at moderate risk for complications without adequate follow up.  This format is felt to be most appropriate for this patient at this time.  All issues noted in this document were discussed and addressed.  A limited physical exam was performed with this format.  Please refer to the patient's chart for her consent to telehealth for [facility].   Date:  06/25/2019   ID:  Cindy Richmond, DOB 09/17/1950, MRN [id number]  Patient Location:Home Provider Location: Home  PCP:  Wolters, Sharon, MD  Cardiologist:  Dr Kalvin Buss  Evaluation Performed:  Follow-Up Visit  Chief Complaint:  FU AS  History of Present Illness:    FUAS. Has been treated forlower extremity edema with diuretics previously. Echocardiogram March 2021 showed normal LV function, moderate left ventricular hypertrophy, severe aortic stenosis with mean gradient 42 mmHg.Carotid Dopplers March 2021 showed no significant stenosis. We discussed aortic valve replacement due to progressive dyspnea on exertion but she was hesitant. However she contacted the office and is now willing to proceed. Since last seen,she continues to have significant dyspnea on exertion and fatigue.  No chest pain, palpitations or syncope.  Get a TSH with her.  The patient does not have symptoms concerning for COVID-19 infection (fever, chills, cough, or new shortness of breath).    Past Medical History:  Diagnosis Date  . Aortic stenosis   . Dyspnea    with head low, sleep apnea  . GERD (gastroesophageal reflux disease)   . H/O hiatal hernia   . Heart murmur    asymptomatic   . Hematuria   . HOH (hard of hearing)    sightly, bilaterally  . Hyperlipidemia   . Hypertension   . Hypothyroidism   . Kidney stone on left side   . Parkinson disease (HCC)    Past Surgical History:  Procedure Laterality Date  . ABDOMINAL HYSTERECTOMY     lso  . CYSTOSCOPY W/ RETROGRADES  02/28/2012   Procedure: CYSTOSCOPY WITH RETROGRADE PYELOGRAM;  Surgeon: Theodore Manny, MD;  Location: [facility];  Service: Urology;  Laterality: Right;  . CYSTOSCOPY WITH STENT PLACEMENT  02/28/2012   Procedure: CYSTOSCOPY WITH STENT PLACEMENT;  Surgeon: Theodore Manny, MD;  Location: [facility];  Service: Urology;  Laterality: Left;  . CYSTOSCOPY/RETROGRADE/URETEROSCOPY/STONE EXTRACTION WITH BASKET  02/28/2012   Procedure: CYSTOSCOPY/RETROGRADE/URETEROSCOPY/STONE EXTRACTION WITH BASKET;  Surgeon: Theodore Manny, MD;  Location: [facility];  Service: Urology;  Laterality: Left;  . HOLMIUM LASER APPLICATION  02/28/2012   Procedure: HOLMIUM LASER APPLICATION;  Surgeon: Theodore Manny, MD;  Location: [facility];  Service: Urology;  Laterality: Left;  . KNEE ARTHROSCOPY     right  . REVERSE SHOULDER ARTHROPLASTY Left 07/22/2018   Procedure: REVERSE SHOULDER ARTHROPLASTY;  Surgeon: Varkey, Dax T, MD;  Location: [facility];  Service: Orthopedics;  Laterality: Left;  . TONSILLECTOMY    . VEIN LIGATION       Current Meds  Medication Sig  . bisoprolol-hydrochlorothiazide (ZIAC) 5-6.25 MG tablet Take 1 tablet by mouth daily.  . brimonidine (ALPHAGAN) 0.15 % ophthalmic solution   . carbidopa-levodopa (SINEMET IR) 25-100 MG tablet 2   TABLETS AT 8AM/2 TABLETS AT NOON/1 TABLET AT 4 PM/1 TABLET AT 8PM  . Cholecalciferol (VITAMIN D3 PO) Take 50 mcg by mouth.  . co-enzyme Q-10 30 MG capsule Take 30 mg by mouth 3 (three) times daily.  . esomeprazole (NEXIUM) 20 MG capsule Take 20 mg by mouth daily.   . levothyroxine (SYNTHROID, LEVOTHROID) 75 MCG tablet Take 75  mcg by mouth daily.  . Magnesium 250 MG TABS Take 1 tablet by mouth daily.  . Multiple Vitamins-Minerals (PRESERVISION AREDS PO) Take 1 tablet by mouth as directed.  . Omega-3 Fatty Acids (OMEGA 3 PO) Take by mouth.  . Polyethyl Glycol-Propyl Glycol (SYSTANE OP) Place 1 drop into both eyes daily as needed (dry eyes).     Allergies:   Penicillins   Social History   Tobacco Use  . Smoking status: Never Smoker  . Smokeless tobacco: Never Used  Substance Use Topics  . Alcohol use: Never  . Drug use: Never     Family Hx: The patient's family history includes CAD in her mother; Healthy in her sister and son; Heart failure in her father.  ROS:   Please see the history of present illness.    No Fever, chills  or productive cough All other systems reviewed and are negative.   Recent Labs: 07/17/2018: Hemoglobin 15.3; Platelets 193 11/17/2018: BUN 15; Creatinine, Ser 0.78; Magnesium 2.2; Potassium 4.3; Sodium 142    Wt Readings from Last 3 Encounters:  06/25/19 270 lb (122.5 kg)  05/26/19 275 lb 12.8 oz (125.1 kg)  02/01/19 284 lb (128.8 kg)     Objective:    Vital Signs:  BP 98/69   Pulse 97   Ht 5' 5" (1.651 m)   Wt 270 lb (122.5 kg)   BMI 44.93 kg/m    VITAL SIGNS:  reviewed NAD Answers questions appropriately Normal affect Remainder of physical examination not performed (telehealth visit; coronavirus pandemic)  ASSESSMENT & PLAN:    1. Aortic stenosis-patient continues to have dyspnea on exertion.  I have felt previously that this is likely multifactorial but aortic stenosis certainly could be contributing.  I discussed TAVR at last office visit but she declined.  She is now willing to proceed.  We will arrange right and left cardiac catheterization.  The risks and benefits including myocardial infarction, CVA and death discussed and she agrees to proceed.  She will have full TAVR evaluation to follow. 2. Hypertension-patient's blood pressure is controlled today.   Continue present medications. 3. Morbid obesity-we again discussed the importance of continuing efforts for weight loss. 4. Dyspnea-this is felt likely multifactorial including aortic stenosis, obesity hypoventilation syndrome, sleep apnea and deconditioning.  Note she is also complaining of fatigue.  I will check TSH and hemoglobin.  COVID-19 Education: The importance of social distancing was discussed today.  Time:   Today, I have spent 16 minutes with the patient with telehealth technology discussing the above problems.     Medication Adjustments/Labs and Tests Ordered: Current medicines are reviewed at length with the patient today.  Concerns regarding medicines are outlined above.   Tests Ordered: No orders of the defined types were placed in this encounter.   Medication Changes: No orders of the defined types were placed in this encounter.   Follow Up:  Either In Person or Virtual in 3 month(s)  Signed, Barnaby Rippeon, MD  06/25/2019 8:11 AM     [facility] 

## 2019-06-23 NOTE — Progress Notes (Signed)
Virtual Visit via Video Note changed to phone visit at patient request and technical difficulties.   This visit type was conducted due to national recommendations for restrictions regarding the COVID-19 Pandemic (e.g. social distancing) in an effort to limit this patient's exposure and mitigate transmission in our community.  Due to her co-morbid illnesses, this patient is at least at moderate risk for complications without adequate follow up.  This format is felt to be most appropriate for this patient at this time.  All issues noted in this document were discussed and addressed.  A limited physical exam was performed with this format.  Please refer to the patient's chart for her consent to telehealth for [facility].   Date:  06/25/2019   ID:  Cindy Richmond, Cindy Richmond 1950/04/20, MRN [id number]  Patient Location:Home Provider Location: Home  PCP:  Mila Palmer, MD  Cardiologist:  Dr Jens Som  Evaluation Performed:  Follow-Up Visit  Chief Complaint:  FU AS  History of Present Illness:    FUAS. Has been treated forlower extremity edema with diuretics previously. Echocardiogram March 2021 showed normal LV function, moderate left ventricular hypertrophy, severe aortic stenosis with mean gradient 42 mmHg.Carotid Dopplers March 2021 showed no significant stenosis. We discussed aortic valve replacement due to progressive dyspnea on exertion but she was hesitant. However she contacted the office and is now willing to proceed. Since last seen,she continues to have significant dyspnea on exertion and fatigue.  No chest pain, palpitations or syncope.  Get a TSH with her.  The patient does not have symptoms concerning for COVID-19 infection (fever, chills, cough, or new shortness of breath).    Past Medical History:  Diagnosis Date  . Aortic stenosis   . Dyspnea    with head low, sleep apnea  . GERD (gastroesophageal reflux disease)   . H/O hiatal hernia   . Heart murmur    asymptomatic   . Hematuria   . HOH (hard of hearing)    sightly, bilaterally  . Hyperlipidemia   . Hypertension   . Hypothyroidism   . Kidney stone on left side   . Parkinson disease [facility])    Past Surgical History:  Procedure Laterality Date  . ABDOMINAL HYSTERECTOMY     lso  . CYSTOSCOPY W/ RETROGRADES  02/28/2012   Procedure: CYSTOSCOPY WITH RETROGRADE PYELOGRAM;  Surgeon: Sebastian Ache, MD;  Location: [facility];  Service: Urology;  Laterality: Right;  . CYSTOSCOPY WITH STENT PLACEMENT  02/28/2012   Procedure: CYSTOSCOPY WITH STENT PLACEMENT;  Surgeon: Sebastian Ache, MD;  Location: [facility];  Service: Urology;  Laterality: Left;  . CYSTOSCOPY/RETROGRADE/URETEROSCOPY/STONE EXTRACTION WITH BASKET  02/28/2012   Procedure: CYSTOSCOPY/RETROGRADE/URETEROSCOPY/STONE EXTRACTION WITH BASKET;  Surgeon: Sebastian Ache, MD;  Location: [facility];  Service: Urology;  Laterality: Left;  . HOLMIUM LASER APPLICATION  02/28/2012   Procedure: HOLMIUM LASER APPLICATION;  Surgeon: Sebastian Ache, MD;  Location: [facility];  Service: Urology;  Laterality: Left;  . KNEE ARTHROSCOPY     right  . REVERSE SHOULDER ARTHROPLASTY Left 07/22/2018   Procedure: REVERSE SHOULDER ARTHROPLASTY;  Surgeon: Bjorn Pippin, MD;  [facility];  Service: Orthopedics;  Laterality: Left;  . TONSILLECTOMY    . VEIN LIGATION       Current Meds  Medication Sig  . bisoprolol-hydrochlorothiazide (ZIAC) 5-6.25 MG tablet Take 1 tablet by mouth daily.  . brimonidine (ALPHAGAN) 0.15 % ophthalmic solution   . carbidopa-levodopa (SINEMET IR) 25-100 MG tablet 2  TABLETS AT 8AM/2 TABLETS AT NOON/1 TABLET AT 4 PM/1 TABLET AT 8PM  . Cholecalciferol (VITAMIN D3 PO) Take 50 mcg by mouth.  . co-enzyme Q-10 30 MG capsule Take 30 mg by mouth 3 (three) times daily.  Marland Kitchen esomeprazole (NEXIUM) 20 MG capsule Take 20 mg by mouth daily.   Marland Kitchen levothyroxine (SYNTHROID, LEVOTHROID) 75 MCG tablet Take 75  mcg by mouth daily.  . Magnesium 250 MG TABS Take 1 tablet by mouth daily.  . Multiple Vitamins-Minerals (PRESERVISION AREDS PO) Take 1 tablet by mouth as directed.  . Omega-3 Fatty Acids (OMEGA 3 PO) Take by mouth.  Vladimir Faster Glycol-Propyl Glycol (SYSTANE OP) Place 1 drop into both eyes daily as needed (dry eyes).     Allergies:   Penicillins   Social History   Tobacco Use  . Smoking status: Never Smoker  . Smokeless tobacco: Never Used  Substance Use Topics  . Alcohol use: Never  . Drug use: Never     Family Hx: The patient's family history includes CAD in her mother; Healthy in her sister and son; Heart failure in her father.  ROS:   Please see the history of present illness.    No Fever, chills  or productive cough All other systems reviewed and are negative.   Recent Labs: 07/17/2018: Hemoglobin 15.3; Platelets 193 11/17/2018: BUN 15; Creatinine, Ser 0.78; Magnesium 2.2; Potassium 4.3; Sodium 142    Wt Readings from Last 3 Encounters:  06/25/19 270 lb (122.5 kg)  05/26/19 275 lb 12.8 oz (125.1 kg)  02/01/19 284 lb (128.8 kg)     Objective:    Vital Signs:  BP 98/69   Pulse 97   Ht 5\' 5"  (1.651 m)   Wt 270 lb (122.5 kg)   BMI 44.93 kg/m    VITAL SIGNS:  reviewed NAD Answers questions appropriately Normal affect Remainder of physical examination not performed (telehealth visit; coronavirus pandemic)  ASSESSMENT & PLAN:    1. Aortic stenosis-patient continues to have dyspnea on exertion.  I have felt previously that this is likely multifactorial but aortic stenosis certainly could be contributing.  I discussed TAVR at last office visit but she declined.  She is now willing to proceed.  We will arrange right and left cardiac catheterization.  The risks and benefits including myocardial infarction, CVA and death discussed and she agrees to proceed.  She will have full TAVR evaluation to follow. 2. Hypertension-patient's blood pressure is controlled today.   Continue present medications. 3. Morbid obesity-we again discussed the importance of continuing efforts for weight loss. 4. Dyspnea-this is felt likely multifactorial including aortic stenosis, obesity hypoventilation syndrome, sleep apnea and deconditioning.  Note she is also complaining of fatigue.  I will check TSH and hemoglobin.  COVID-19 Education: The importance of social distancing was discussed today.  Time:   Today, I have spent 16 minutes with the patient with telehealth technology discussing the above problems.     Medication Adjustments/Labs and Tests Ordered: Current medicines are reviewed at length with the patient today.  Concerns regarding medicines are outlined above.   Tests Ordered: No orders of the defined types were placed in this encounter.   Medication Changes: No orders of the defined types were placed in this encounter.   Follow Up:  Either In Person or Virtual in 3 month(s)  Signed, Kirk Ruths, MD  06/25/2019 8:11 AM    [place]

## 2019-06-25 ENCOUNTER — Encounter: Payer: Self-pay | Admitting: Cardiology

## 2019-06-25 ENCOUNTER — Other Ambulatory Visit: Payer: Self-pay | Admitting: *Deleted

## 2019-06-25 ENCOUNTER — Telehealth (INDEPENDENT_AMBULATORY_CARE_PROVIDER_SITE_OTHER): Payer: Medicare PPO | Admitting: Cardiology

## 2019-06-25 ENCOUNTER — Inpatient Hospital Stay (HOSPITAL_COMMUNITY): Admission: RE | Admit: 2019-06-25 | Payer: Medicare PPO | Source: Ambulatory Visit

## 2019-06-25 VITALS — BP 98/69 | HR 97 | Ht 65.0 in | Wt 270.0 lb

## 2019-06-25 DIAGNOSIS — I350 Nonrheumatic aortic (valve) stenosis: Secondary | ICD-10-CM

## 2019-06-25 DIAGNOSIS — I10 Essential (primary) hypertension: Secondary | ICD-10-CM

## 2019-06-25 MED ORDER — SODIUM CHLORIDE 0.9% FLUSH: 3.0000 mL | Freq: Two times a day (BID) | INTRAVENOUS | Status: DC

## 2019-06-25 NOTE — Patient Instructions (Signed)
    [facility] CARDIOVASCULAR DIVISION [facility] [address] [place] [address] Dept: [phone] Loc: [phone]  Cindy Richmond  06/25/2019  You are scheduled for a Cardiac Catheterization on Monday, May 10 with Dr. Verne Carrow.  1. Please arrive at the [facility] (Main Entrance A) at [facility]: [address] [place], [place] [address] at 8:30 AM (This time is two hours before your procedure to ensure your preparation). Free valet parking service is available.   Special note: Every effort is made to have your procedure done on time. Please understand that emergencies sometimes delay scheduled procedures.  2. Diet: Do not eat solid foods after midnight.  The patient may have clear liquids until 5am upon the day of the procedure.  3. Labs: You will need to have blood drawn on Friday 07/02/2019  GO TO [address] ON Friday 07/02/19 @ 1:30 PM FOR COVID TESTING  4. Medication instructions in preparation for your procedure:  On the morning of your procedure, take your morning medicines.  You may use sips of water.  5. Plan for one night stay--bring personal belongings. 6. Bring a current list of your medications and current insurance cards. 7. You MUST have a responsible person to drive you home. 8. Someone MUST be with you the first 24 hours after you arrive home or your discharge will be delayed. 9. Please wear clothes that are easy to get on and off and wear slip-on shoes.  Thank you for allowing [place] to care for you!   -- Cindy Richmond Invasive Cardiovascular services   Your physician recommends that you schedule a follow-up appointment in: 3 MONTHS WITH DR Inda Merlin

## 2019-06-28 DIAGNOSIS — H35033 Hypertensive retinopathy, bilateral: Secondary | ICD-10-CM | POA: Diagnosis not present

## 2019-06-28 DIAGNOSIS — H2513 Age-related nuclear cataract, bilateral: Secondary | ICD-10-CM | POA: Diagnosis not present

## 2019-06-28 DIAGNOSIS — H43821 Vitreomacular adhesion, right eye: Secondary | ICD-10-CM | POA: Diagnosis not present

## 2019-06-28 DIAGNOSIS — H34232 Retinal artery branch occlusion, left eye: Secondary | ICD-10-CM | POA: Diagnosis not present

## 2019-07-01 ENCOUNTER — Other Ambulatory Visit: Payer: Self-pay

## 2019-07-01 ENCOUNTER — Other Ambulatory Visit: Payer: Medicare PPO | Admitting: *Deleted

## 2019-07-01 ENCOUNTER — Telehealth: Payer: Self-pay | Admitting: *Deleted

## 2019-07-01 DIAGNOSIS — Z01812 Encounter for preprocedural laboratory examination: Secondary | ICD-10-CM

## 2019-07-01 DIAGNOSIS — I350 Nonrheumatic aortic (valve) stenosis: Secondary | ICD-10-CM | POA: Diagnosis not present

## 2019-07-01 NOTE — Telephone Encounter (Signed)
Pt contacted pre-catheterization scheduled at [facility] for: Monday Jul 05, 2019 10:30 AM Verified arrival time and place: [facility] Main Entrance A [facility]) at: 8;30 AM   No solid food after midnight prior to cath, clear liquids until 5 AM day of procedure.  Hold: Bisoprolol-HCT- AM of procedure  Except AM meds can be  taken pre-cath with sip of water including: ASA 81 mg   Confirmed patient has responsible adult to drive home post procedure and observe 24 hours after arriving home: yes  You are allowed ONE visitor in the waiting room during your procedures. Both you and your visitor must wear masks.      COVID-19 Pre-Screening Questions:  . In the past 7 to 10 days have you had a cough,  shortness of breath, headache, congestion, fever (100 or greater) body aches, chills, sore throat, or sudden loss of taste or sense of smell? no . Have you been around anyone with known Covid 19 in the past 7 to 10 days? no . Have you been around anyone who is awaiting Covid 19 test results in the past 7 to 10 days? no . Have you been around anyone who has mentioned symptoms of Covid 19 within the past 7 to 10 days? no  Reviewed procedure/mask/visitor instructions, COVID-19 screening questions with patient.  Pt prefers to get pre procedure lab today at [facility] office-I have entered orders and scheduled appt.

## 2019-07-02 ENCOUNTER — Other Ambulatory Visit (HOSPITAL_COMMUNITY)
Admission: RE | Admit: 2019-07-02 | Discharge: 2019-07-02 | Disposition: A | Discharge status: Home / Self Care | Payer: Medicare PPO | Source: Ambulatory Visit | Attending: Cardiovascular Disease | Admitting: Cardiovascular Disease

## 2019-07-02 DIAGNOSIS — Z01812 Encounter for preprocedural laboratory examination: Secondary | ICD-10-CM | POA: Diagnosis not present

## 2019-07-02 DIAGNOSIS — Z20822 Contact with and (suspected) exposure to covid-19: Secondary | ICD-10-CM | POA: Insufficient documentation

## 2019-07-02 LAB — CBC
Hematocrit: 48 % — ABNORMAL HIGH (ref 34.0–46.6)
Hemoglobin: 15.3 g/dL (ref 11.1–15.9)
MCH: 28.7 pg (ref 26.6–33.0)
MCHC: 31.9 g/dL (ref 31.5–35.7)
MCV: 90 fL (ref 79–97)
Platelets: 179 10*3/uL (ref 150–450)
RBC: 5.34 x10E6/uL — ABNORMAL HIGH (ref 3.77–5.28)
RDW: 15.3 % (ref 11.7–15.4)
WBC: 10.3 10*3/uL (ref 3.4–10.8)

## 2019-07-02 LAB — BASIC METABOLIC PANEL
BUN/Creatinine Ratio: 15 (ref 12–28)
BUN: 13 mg/dL (ref 8–27)
CO2: 26 mmol/L (ref 20–29)
Calcium: 9.6 mg/dL (ref 8.7–10.3)
Chloride: 100 mmol/L (ref 96–106)
Creatinine, Ser: 0.86 mg/dL (ref 0.57–1.00)
GFR calc Af Amer: 79 mL/min/{1.73_m2} (ref >59)
GFR calc non Af Amer: 68 mL/min/{1.73_m2} (ref >59)
Glucose: 93 mg/dL (ref 65–99)
Potassium: 4.3 mmol/L (ref 3.5–5.2)
Sodium: 143 mmol/L (ref 134–144)

## 2019-07-03 LAB — SARS CORONAVIRUS 2 (TAT 6-24 HRS): SARS Coronavirus 2: NEGATIVE

## 2019-07-05 ENCOUNTER — Other Ambulatory Visit: Payer: Self-pay | Admitting: *Deleted

## 2019-07-05 ENCOUNTER — Ambulatory Visit (HOSPITAL_COMMUNITY)
Admission: RE | Admit: 2019-07-05 | Discharge: 2019-07-05 | Disposition: A | Discharge status: Home / Self Care | Payer: Medicare PPO | Attending: Cardiovascular Disease | Admitting: Cardiovascular Disease

## 2019-07-05 ENCOUNTER — Encounter (HOSPITAL_COMMUNITY)
Admission: RE | Disposition: A | Discharge status: Home / Self Care | Payer: Self-pay | Source: Home / Self Care | Attending: Cardiovascular Disease

## 2019-07-05 ENCOUNTER — Other Ambulatory Visit: Payer: Self-pay

## 2019-07-05 DIAGNOSIS — H9193 Unspecified hearing loss, bilateral: Secondary | ICD-10-CM | POA: Diagnosis not present

## 2019-07-05 DIAGNOSIS — E785 Hyperlipidemia, unspecified: Secondary | ICD-10-CM | POA: Diagnosis not present

## 2019-07-05 DIAGNOSIS — I350 Nonrheumatic aortic (valve) stenosis: Secondary | ICD-10-CM | POA: Diagnosis not present

## 2019-07-05 DIAGNOSIS — E039 Hypothyroidism, unspecified: Secondary | ICD-10-CM | POA: Diagnosis not present

## 2019-07-05 DIAGNOSIS — I119 Hypertensive heart disease without heart failure: Secondary | ICD-10-CM | POA: Insufficient documentation

## 2019-07-05 DIAGNOSIS — Z880 Allergy status to penicillin: Secondary | ICD-10-CM | POA: Insufficient documentation

## 2019-07-05 DIAGNOSIS — Z6841 Body Mass Index (BMI) 40.0 and over, adult: Secondary | ICD-10-CM | POA: Insufficient documentation

## 2019-07-05 DIAGNOSIS — G4730 Sleep apnea, unspecified: Secondary | ICD-10-CM | POA: Diagnosis not present

## 2019-07-05 DIAGNOSIS — K219 Gastro-esophageal reflux disease without esophagitis: Secondary | ICD-10-CM | POA: Diagnosis not present

## 2019-07-05 DIAGNOSIS — Z8249 Family history of ischemic heart disease and other diseases of the circulatory system: Secondary | ICD-10-CM | POA: Diagnosis not present

## 2019-07-05 DIAGNOSIS — Z79899 Other long term (current) drug therapy: Secondary | ICD-10-CM | POA: Diagnosis not present

## 2019-07-05 DIAGNOSIS — Z79890 Hormone replacement therapy (postmenopausal): Secondary | ICD-10-CM | POA: Insufficient documentation

## 2019-07-05 DIAGNOSIS — G20 Parkinson's disease: Secondary | ICD-10-CM | POA: Insufficient documentation

## 2019-07-05 HISTORY — PX: RIGHT/LEFT HEART CATH AND CORONARY ANGIOGRAPHY: CATH118266

## 2019-07-05 LAB — POCT I-STAT EG7
Acid-Base Excess: 3 mmol/L — ABNORMAL HIGH (ref 0.0–2.0)
Bicarbonate: 29.9 mmol/L — ABNORMAL HIGH (ref 20.0–28.0)
Calcium, Ion: 1.2 mmol/L (ref 1.15–1.40)
HCT: 39 % (ref 36.0–46.0)
Hemoglobin: 13.3 g/dL (ref 12.0–15.0)
O2 Saturation: 69 %
Potassium: 3.8 mmol/L (ref 3.5–5.1)
Sodium: 142 mmol/L (ref 135–145)
TCO2: 32 mmol/L (ref 22–32)
pCO2, Ven: 53.1 mmHg (ref 44.0–60.0)
pH, Ven: 7.359 (ref 7.250–7.430)
pO2, Ven: 38 mmHg (ref 32.0–45.0)

## 2019-07-05 LAB — POCT I-STAT 7, (LYTES, BLD GAS, ICA,H+H)
Acid-Base Excess: 0 mmol/L (ref 0.0–2.0)
Bicarbonate: 26.1 mmol/L (ref 20.0–28.0)
Calcium, Ion: 1.09 mmol/L — ABNORMAL LOW (ref 1.15–1.40)
HCT: 37 % (ref 36.0–46.0)
Hemoglobin: 12.6 g/dL (ref 12.0–15.0)
O2 Saturation: 97 %
Potassium: 3.6 mmol/L (ref 3.5–5.1)
Sodium: 144 mmol/L (ref 135–145)
TCO2: 27 mmol/L (ref 22–32)
pCO2 arterial: 45.6 mmHg (ref 32.0–48.0)
pH, Arterial: 7.366 (ref 7.350–7.450)
pO2, Arterial: 90 mmHg (ref 83.0–108.0)

## 2019-07-05 SURGERY — RIGHT/LEFT HEART CATH AND CORONARY ANGIOGRAPHY
Anesthesia: LOCAL

## 2019-07-05 MED ORDER — SODIUM CHLORIDE 0.9 % WEIGHT BASED INFUSION
3.0000 mL/kg/h | INTRAVENOUS | Status: AC
  Administered 2019-07-05: 3 mL/kg/h via INTRAVENOUS

## 2019-07-05 MED ORDER — LABETALOL HCL 5 MG/ML IV SOLN: 10.0000 mg | INTRAVENOUS | Status: DC | PRN

## 2019-07-05 MED ORDER — HEPARIN SODIUM (PORCINE) 1000 UNIT/ML IJ SOLN
INTRAMUSCULAR | Status: AC
  Filled 2019-07-05: qty 1

## 2019-07-05 MED ORDER — ONDANSETRON HCL 4 MG/2ML IJ SOLN: 4.0000 mg | Freq: Four times a day (QID) | INTRAMUSCULAR | Status: DC | PRN

## 2019-07-05 MED ORDER — LIDOCAINE HCL (PF) 1 % IJ SOLN
INTRAMUSCULAR | Status: DC | PRN
  Administered 2019-07-05 (×2): 2 mL via INTRADERMAL

## 2019-07-05 MED ORDER — SODIUM CHLORIDE 0.9 % IV SOLN: INTRAVENOUS | Status: AC

## 2019-07-05 MED ORDER — SODIUM CHLORIDE 0.9% FLUSH: 3.0000 mL | INTRAVENOUS | Status: DC | PRN

## 2019-07-05 MED ORDER — SODIUM CHLORIDE 0.9 % IV SOLN: 250.0000 mL | INTRAVENOUS | Status: DC | PRN

## 2019-07-05 MED ORDER — MIDAZOLAM HCL 2 MG/2ML IJ SOLN
INTRAMUSCULAR | Status: AC
  Filled 2019-07-05: qty 2

## 2019-07-05 MED ORDER — HEPARIN (PORCINE) IN NACL 1000-0.9 UT/500ML-% IV SOLN
INTRAVENOUS | Status: AC
  Filled 2019-07-05: qty 1500

## 2019-07-05 MED ORDER — IOHEXOL 350 MG/ML SOLN
INTRAVENOUS | Status: DC | PRN
  Administered 2019-07-05: 50 mL

## 2019-07-05 MED ORDER — LIDOCAINE HCL (PF) 1 % IJ SOLN
INTRAMUSCULAR | Status: AC
  Filled 2019-07-05: qty 30

## 2019-07-05 MED ORDER — HYDRALAZINE HCL 20 MG/ML IJ SOLN: 10.0000 mg | INTRAMUSCULAR | Status: DC | PRN

## 2019-07-05 MED ORDER — ASPIRIN 81 MG PO CHEW: 81.0000 mg | CHEWABLE_TABLET | ORAL | Status: DC

## 2019-07-05 MED ORDER — FENTANYL CITRATE (PF) 100 MCG/2ML IJ SOLN
INTRAMUSCULAR | Status: AC
  Filled 2019-07-05: qty 2

## 2019-07-05 MED ORDER — SODIUM CHLORIDE 0.9% FLUSH: 3.0000 mL | Freq: Two times a day (BID) | INTRAVENOUS | Status: DC

## 2019-07-05 MED ORDER — HEPARIN SODIUM (PORCINE) 1000 UNIT/ML IJ SOLN
INTRAMUSCULAR | Status: DC | PRN
  Administered 2019-07-05: 6000 [IU] via INTRAVENOUS

## 2019-07-05 MED ORDER — FENTANYL CITRATE (PF) 100 MCG/2ML IJ SOLN
INTRAMUSCULAR | Status: DC | PRN
  Administered 2019-07-05: 25 ug via INTRAVENOUS

## 2019-07-05 MED ORDER — APIXABAN 5 MG PO TABS: 5.0000 mg | ORAL_TABLET | Freq: Two times a day (BID) | ORAL | 6 refills | Status: DC

## 2019-07-05 MED ORDER — VERAPAMIL HCL 2.5 MG/ML IV SOLN
INTRAVENOUS | Status: AC
  Filled 2019-07-05: qty 2

## 2019-07-05 MED ORDER — VERAPAMIL HCL 2.5 MG/ML IV SOLN
INTRAVENOUS | Status: DC | PRN
  Administered 2019-07-05: 10 mL via INTRA_ARTERIAL

## 2019-07-05 MED ORDER — SODIUM CHLORIDE 0.9 % WEIGHT BASED INFUSION: 1.0000 mL/kg/h | INTRAVENOUS | Status: DC

## 2019-07-05 MED ORDER — HEPARIN (PORCINE) IN NACL 1000-0.9 UT/500ML-% IV SOLN
INTRAVENOUS | Status: DC | PRN
  Administered 2019-07-05 (×2): 500 mL

## 2019-07-05 MED ORDER — ACETAMINOPHEN 325 MG PO TABS: 650.0000 mg | ORAL_TABLET | ORAL | Status: DC | PRN

## 2019-07-05 MED ORDER — MIDAZOLAM HCL 2 MG/2ML IJ SOLN
INTRAMUSCULAR | Status: DC | PRN
  Administered 2019-07-05: 1 mg via INTRAVENOUS

## 2019-07-05 SURGICAL SUPPLY — 15 items
CATH 5FR JL3.5 JR4 ANG PIG MP (CATHETERS) ×1 IMPLANT
CATH BALLN WEDGE 5F 110CM (CATHETERS) ×1 IMPLANT
CATH INFINITI 5FR AL1 (CATHETERS) ×1 IMPLANT
DEVICE RAD COMP TR BAND LRG (VASCULAR PRODUCTS) ×1 IMPLANT
GLIDESHEATH SLEND SS 6F .021 (SHEATH) ×1 IMPLANT
GUIDEWIRE .025 260CM (WIRE) ×1 IMPLANT
GUIDEWIRE INQWIRE 1.5J.035X260 (WIRE) IMPLANT
INQWIRE 1.5J .035X260CM (WIRE) ×2
KIT HEART LEFT (KITS) ×2 IMPLANT
PACK CARDIAC CATHETERIZATION (CUSTOM PROCEDURE TRAY) ×2 IMPLANT
SHEATH GLIDE SLENDER 4/5FR (SHEATH) ×1 IMPLANT
TRANSDUCER W/STOPCOCK (MISCELLANEOUS) ×2 IMPLANT
TUBING CIL FLEX 10 FLL-RA (TUBING) ×2 IMPLANT
WIRE EMERALD 3MM-J .025X260CM (WIRE) ×1 IMPLANT
WIRE EMERALD ST .035X150CM (WIRE) ×1 IMPLANT

## 2019-07-05 NOTE — Discharge Instructions (Signed)
We are calling in Eliquis to the outpatient pharmacy. Eliquis should be started on Tuesday May 10th in the morning if there is no bleeding from the right wrist or elbow.   DRINK PLENTY OF FLUIDS FOR THE NEXT 2-3 DAYS.  KEEP ARM ELEVATED THE REMAINDER OF THE DAY.  Radial Site Care  This sheet gives you information about how to care for yourself after your procedure. Your health care provider may also give you more specific instructions. If you have problems or questions, contact your health care provider. What can I expect after the procedure? After the procedure, it is common to have:  Bruising and tenderness at the catheter insertion area. Follow these instructions at home: Medicines  Take over-the-counter and prescription medicines only as told by your health care provider. Insertion site care 1. Follow instructions from your health care provider about how to take care of your insertion site. Make sure you: ? Wash your hands with soap and water before you change your bandage (dressing). If soap and water are not available, use hand sanitizer. ? Change your dressing as told by your health care provider. 2. Check your insertion site every day for signs of infection. Check for: ? Redness, swelling, or pain. ? Fluid or blood. ? Pus or a bad smell. ? Warmth. 3. Do not take baths, swim, or use a hot tub for 5 days. 4. You may shower 24-48 hours after the procedure. ? Remove the dressing and gently wash the site with plain soap and water. ? Pat the area dry with a clean towel. ? Do not rub the site. That could cause bleeding. 5. Do not apply powder or lotion to the site. Activity  1. For 24 hours after the procedure, or as directed by your health care provider: ? Do not flex or bend the affected arm. ? Do not push or pull heavy objects with the affected arm. ? Do not drive yourself home from the hospital or clinic. You may drive 24 hours after the procedure. ? Do not operate  machinery or power tools. 2. Do not push, pull or lift anything that is heavier than 10 lb for 5 days. 3. Ask your health care provider when it is okay to: ? Return to work or school. ? Resume usual physical activities or sports. ? Resume sexual activity. General instructions  If the catheter site starts to bleed, raise your arm and put firm pressure on the site. If the bleeding does not stop, get help right away. This is a medical emergency.  If you went home on the same day as your procedure, a responsible adult should be with you for the first 24 hours after you arrive home.  Keep all follow-up visits as told by your health care provider. This is important. Contact a health care provider if:  You have a fever.  You have redness, swelling, or yellow drainage around your insertion site. Get help right away if:  You have unusual pain at the radial site.  The catheter insertion area swells very fast.  The insertion area is bleeding, and the bleeding does not stop when you hold steady pressure on the area.  Your arm or hand becomes pale, cool, tingly, or numb. These symptoms may represent a serious problem that is an emergency. Do not wait to see if the symptoms will go away. Get medical help right away. Call your local emergency services (911 in the [place] Do not drive yourself to the hospital. Summary  After the procedure, it is common to have bruising and tenderness at the site.  Follow instructions from your health care provider about how to take care of your radial site wound. Check the wound every day for signs of infection.  Do not push, pull or lift anything that is heavier than 10 lb for 5 days.  This information is not intended to replace advice given to you by your health care provider. Make sure you discuss any questions you have with your health care provider. Document Revised: 03/19/2017 Document Reviewed: 03/19/2017 Elsevier Patient Education  2020 [employer].

## 2019-07-05 NOTE — Interval H&P Note (Signed)
History and Physical Interval Note:  07/05/2019 8:39 AM  Cindy Richmond  has presented today for surgery, with the diagnosis of aortic stenosis.  The various methods of treatment have been discussed with the patient and family. After consideration of risks, benefits and other options for treatment, the patient has consented to  Procedure(s): RIGHT/LEFT HEART CATH AND CORONARY ANGIOGRAPHY (N/A) as a surgical intervention.  The patient's history has been reviewed, patient examined, no change in status, stable for surgery.  I have reviewed the patient's chart and labs.  Questions were answered to the patient's satisfaction.    Cath Lab Visit (complete for each Cath Lab visit)  Clinical Evaluation Leading to the Procedure:   ACS: No.  Non-ACS:    Anginal Classification: CCS II  Anti-ischemic medical therapy: Minimal Therapy (1 class of medications)  Non-Invasive Test Results: No non-invasive testing performed  Prior CABG: No previous CABG        Verne Carrow

## 2019-07-09 ENCOUNTER — Other Ambulatory Visit: Payer: Self-pay | Admitting: Physician Assistant

## 2019-07-09 DIAGNOSIS — I350 Nonrheumatic aortic (valve) stenosis: Secondary | ICD-10-CM

## 2019-07-12 ENCOUNTER — Encounter: Payer: Self-pay | Admitting: *Deleted

## 2019-07-12 ENCOUNTER — Ambulatory Visit: Payer: Medicare PPO | Admitting: Cardiovascular Disease

## 2019-07-12 ENCOUNTER — Encounter: Payer: Self-pay | Admitting: Physician Assistant

## 2019-07-12 ENCOUNTER — Other Ambulatory Visit: Payer: Self-pay

## 2019-07-12 ENCOUNTER — Encounter: Payer: Self-pay | Admitting: Cardiovascular Disease

## 2019-07-12 VITALS — BP 120/76 | HR 88 | Ht 65.0 in | Wt 271.0 lb

## 2019-07-12 DIAGNOSIS — I350 Nonrheumatic aortic (valve) stenosis: Secondary | ICD-10-CM | POA: Diagnosis not present

## 2019-07-12 MED ORDER — METOPROLOL TARTRATE 100 MG PO TABS
100.0000 mg | ORAL_TABLET | Freq: Once | ORAL | 0 refills | Status: DC
Start: 2019-07-12 — End: 2019-07-19

## 2019-07-12 NOTE — Patient Instructions (Addendum)
Medication Instructions:  No changes today *If you need a refill on your cardiac medications before your next appointment, please call your pharmacy*   Lab Work: none If you have labs (blood work) drawn today and your tests are completely normal, you will receive your results only by: Marland Kitchen MyChart Message (if you have MyChart) OR . A paper copy in the mail If you have any lab test that is abnormal or we need to change your treatment, we will call you to review the results.   Testing/Procedures: As planned.   Other Instructions Pt to take Lopressor 100 mg 2 hrs prior to CT on 5/19. Instructed to hold bisoprolol-hctz that day.  Julieta Gutting, RN Structural Nurse Navigator will be in contact with you regarding the next steps.

## 2019-07-12 NOTE — Progress Notes (Signed)
[facility] Consult Note  Chief Complaint  Patient presents with  . Follow-up    Severe aortic stenosis    History of Present Illness: 69 yo female with history of GERD, HTN, hypothyroidism, Parkinson's disease, atrial fibrillation on coumadin and severe aortic stenosis who is here today as a new consult, referred by Dr. Jens Som, for further discussion regarding her aortic stenosis and possible TAVR. Echo March 2021 with LVEF=60-65%. Severe aortic stenosis with mean gradient 42 mmHg, peak gradient 65 mmHg, AVA 0.4 cm2, dimensionless index 0.17. Cardiac cath 07/05/19 with no evidence of CAD. She was found to be in atrial fibrillation on the day of her heart cath and was started on Eliquis.   She tells me today that she has had progressive dyspnea for several months. She also notes progressive fatigue. No chest pain. She has no LE edema. No dizziness or near syncope. She is retired Runner, broadcasting/film/video. She lives alone in [place]. She goes to the dentist regularly. No active dental issues.   Primary Care Physician: Mila Palmer, MD Primary Cardiologist: Jens Som Referring Cardiologist: Jens Som  Past Medical History:  Diagnosis Date  . Aortic stenosis   . Atrial fibrillation (HCC)   . Dyspnea    with head low, sleep apnea  . GERD (gastroesophageal reflux disease)   . H/O hiatal hernia   . Heart murmur    asymptomatic  . Hematuria   . HOH (hard of hearing)    sightly, bilaterally  . Hyperlipidemia   . Hypertension   . Hypothyroidism   . Kidney stone on left side   . Parkinson disease [facility])     Past Surgical History:  Procedure Laterality Date  . ABDOMINAL HYSTERECTOMY     lso  . CYSTOSCOPY W/ RETROGRADES  02/28/2012   Procedure: CYSTOSCOPY WITH RETROGRADE PYELOGRAM;  Surgeon: Sebastian Ache, MD;  Location: [facility];  Service: Urology;  Laterality: Right;  . CYSTOSCOPY WITH STENT PLACEMENT  02/28/2012   Procedure: CYSTOSCOPY WITH STENT PLACEMENT;  Surgeon:  Sebastian Ache, MD;  Location: [facility];  Service: Urology;  Laterality: Left;  . CYSTOSCOPY/RETROGRADE/URETEROSCOPY/STONE EXTRACTION WITH BASKET  02/28/2012   Procedure: CYSTOSCOPY/RETROGRADE/URETEROSCOPY/STONE EXTRACTION WITH BASKET;  Surgeon: Sebastian Ache, MD;  Location: [facility];  Service: Urology;  Laterality: Left;  . HOLMIUM LASER APPLICATION  02/28/2012   Procedure: HOLMIUM LASER APPLICATION;  Surgeon: Sebastian Ache, MD;  Location: [facility];  Service: Urology;  Laterality: Left;  . KNEE ARTHROSCOPY     right  . REVERSE SHOULDER ARTHROPLASTY Left 07/22/2018   Procedure: REVERSE SHOULDER ARTHROPLASTY;  Surgeon: Bjorn Pippin, MD;  [facility];  Service: Orthopedics;  Laterality: Left;  . RIGHT/LEFT HEART CATH AND CORONARY ANGIOGRAPHY N/A 07/05/2019   Procedure: RIGHT/LEFT HEART CATH AND CORONARY ANGIOGRAPHY;  Surgeon: Kathleene Hazel, MD;  [facility];  Service: Cardiovascular;  Laterality: N/A;  . TONSILLECTOMY    . VEIN LIGATION      Current Outpatient Medications  Medication Sig Dispense Refill  . apixaban (ELIQUIS) 5 MG TABS tablet Take 1 tablet (5 mg total) by mouth 2 (two) times daily. 60 tablet 6  . aspirin 81 MG chewable tablet Chew 81 mg by mouth daily.    . bisoprolol-hydrochlorothiazide (ZIAC) 5-6.25 MG tablet Take 1 tablet by mouth daily.    . brimonidine (ALPHAGAN) 0.15 % ophthalmic solution Place 1 drop into the left eye daily.     . carbidopa-levodopa (SINEMET IR) 25-100 MG tablet  2 TABLETS AT 8AM/2 TABLETS AT NOON/1 TABLET AT 4 PM/1 TABLET AT 8PM (Patient taking differently: Take 1-2 tablets by mouth See admin instructions. 2 tablets at 8am/2 tablets at noon/1 tablet at 4 pm/1 tablet at 8pm) 540 tablet 1  . cholecalciferol (VITAMIN D) 25 MCG (1000 UNIT) tablet Take 1,000 Units by mouth daily.    . Collagen-Boron-Hyaluronic Acid ([facility] JO[facility]ALTH TRIPLE ACTION PO) Take 1 tablet by mouth daily.  Triple Action Joint Health    . Cyanocobalamin (VITAMIN B-12) 5000 MCG TBDP Take 5,000 mcg by mouth daily.    Marland Kitchen docusate sodium (COLACE) 100 MG capsule Take 200 mg by mouth at bedtime.    Marland Kitchen esomeprazole (NEXIUM) 20 MG capsule Take 20 mg by mouth daily at 12 noon.     Marland Kitchen levothyroxine (SYNTHROID, LEVOTHROID) 75 MCG tablet Take 75 mcg by mouth daily before breakfast.     . Magnesium 250 MG TABS Take 250 mg by mouth at bedtime.     . Multiple Vitamins-Minerals (ANTIOXIDANT PO) Take 1 tablet by mouth daily at 6 (six) AM. MitoQ Eye (MitoQ w/Lutein, Zeaxanthin, Bilberry and [employer])    . Multiple Vitamins-Minerals (PRESERVISION AREDS PO) Take 1 tablet by mouth daily.     . Omega-3 Fatty Acids (FISH OIL) 1200 MG CAPS Take 1,200 mg by mouth daily.    Bertram Gala Glycol-Propyl Glycol (SYSTANE OP) Place 1 drop into both eyes 3 (three) times daily as needed (dry eyes).     Bertram Gala Glycol-Propyl Glycol (SYSTANE) 0.4-0.3 % GEL ophthalmic gel Place 1 application into both eyes daily as needed (dry/irritated eyes.).    Marland Kitchen metoprolol tartrate (LOPRESSOR) 100 MG tablet Take 1 tablet (100 mg total) by mouth once for 1 dose. Take 2 hours prior to CT scan on 5/19 1 tablet 0   Current Facility-Administered Medications  Medication Dose Route Frequency Provider Last Rate Last Admin  . sodium chloride flush (NS) 0.9 % injection 3 mL  3 mL Intravenous Q12H Jens Som Madolyn Frieze, MD        Allergies  Allergen Reactions  . Penicillins Other (See Comments)    unknown Did it involve swelling of the face/tongue/throat, SOB, or low BP? Unknown Did it involve sudden or severe rash/hives, skin peeling, or any reaction on the inside of your mouth or nose? Unknown Did you need to seek medical attention at a hospital or doctor's office? Unknown When did it last happen?newborn allergy If all above answers are "NO", may proceed with cephalosporin use.     Social History   Socioeconomic History  .  Marital status: Widowed    Spouse name: Not on file  . Number of children: 3  . Years of education: Not on file  . Highest education level: Associate degree: occupational, Scientist, product/process development, or vocational program  Occupational History  . 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 Smoking status: Never Smoker  . Smokeless tobacco: Never Used  Substance and Sexual Activity  . Alcohol use: Never  . Drug use: Never  . Sexual activity: Not on file  Other Topics Concern  . Not on file  Social History Narrative  . Not on file   Social Determinants of Health   Financial Resource Strain:   . Difficulty of Paying Living Expenses:   Food Insecurity:   . Worried About Programme researcher, broadcasting/film/video in the Last Year:   . [employer] of Food in the Last Year:  Transportation Needs:   . Freight forwarderLack of Transportation (Medical):   Marland Kitchen. Lack of Transportation (Non-Medical):   Physical Activity:   . Days of Exercise per Week:   . Minutes of Exercise per Session:   Stress:   . Feeling of Stress :   Social Connections:   . Frequency of Communication with Friends and Family:   . Frequency of Social Gatherings with Friends and Family:   . Attends Religious Services:   . Active Member of Clubs or Organizations:   . Attends BankerClub or Organization Meetings:   Marland Kitchen. Marital Status:   Intimate Partner Violence:   . Fear of Current or Ex-Partner:   . Emotionally Abused:   Marland Kitchen. Physically Abused:   . Sexually Abused:     Family History  Problem Relation Age of Onset  . Heart failure Father   . Healthy Sister   . Healthy Son     Review of Systems:  As stated in the HPI and otherwise negative.   BP 120/76   Pulse 88   Ht 5\' 5"  (1.651 m)   Wt 271 lb (122.9 kg)   SpO2 97%   BMI 45.10 kg/m   Physical Examination: General: Well developed, well nourished, NAD  HEENT: OP clear, mucus membranes moist  SKIN: warm, dry. No rashes. Neuro: No focal deficits  Musculoskeletal: Muscle strength 5/5 all ext   Psychiatric: Mood and affect normal  Neck: No JVD, no carotid bruits, no thyromegaly, no lymphadenopathy.  Lungs:Clear bilaterally, no wheezes, rhonci, crackles Cardiovascular: Regular rate and rhythm. Loud, harsh, late peaking systolic murmur.  Abdomen:Soft. Bowel sounds present. Non-tender.  Extremities: No lower extremity edema. Pulses are 2 + in the bilateral DP/PT.  EKG:  EKG is not ordered today. The ekg ordered today demonstrates   Echo 05/11/19: 1. Left ventricular ejection fraction, by estimation, is 60 to 65%. The  left ventricle has normal function. The left ventricle has no regional  wall motion abnormalities. There is moderate left ventricular hypertrophy.  Left ventricular diastolic function  could not be evaluated.  2. The mitral valve is normal in structure. No evidence of mitral valve  regurgitation. No evidence of mitral stenosis.  3. Aortic valve regurgitation is not visualized. Severe aortic valve  stenosis.  4. The inferior vena cava is normal in size with greater than 50%  respiratory variability, suggesting right atrial pressure of 3 mmHg.   FINDINGS  Left Ventricle: Left ventricular ejection fraction, by estimation, is 60  to 65%. The left ventricle has normal function. The left ventricle has no  regional wall motion abnormalities. The left ventricular internal cavity  size was normal in size. There is  moderate left ventricular hypertrophy. Left ventricular diastolic  function could not be evaluated.   Right Ventricle: The right ventricular size is normal. No increase in  right ventricular wall thickness. Right ventricular systolic function is  normal. There is normal pulmonary artery systolic pressure. The tricuspid  regurgitant velocity is 1.59 m/s, and  with an assumed right atrial pressure of 8 mmHg, the estimated right  ventricular systolic pressure is 18.1 mmHg.   Left Atrium: Left atrial size was normal in size.   Right Atrium: Right  atrial size was normal in size.   Pericardium: There is no evidence of pericardial effusion.   Mitral Valve: The mitral valve is normal in structure. Normal mobility of  the mitral valve leaflets. No evidence of mitral valve regurgitation. No  evidence of mitral valve stenosis.   Tricuspid Valve:  The tricuspid valve is normal in structure. Tricuspid  valve regurgitation is not demonstrated. No evidence of tricuspid  stenosis.   Aortic Valve: The aortic valve is abnormal. Aortic valve regurgitation is  not visualized. Severe aortic stenosis is present. Aortic valve mean  gradient measures 42.6 mmHg. Aortic valve peak gradient measures 64.9  mmHg. Aortic valve area, by VTI measures  0.43 cm.   Pulmonic Valve: The pulmonic valve was normal in structure. Pulmonic valve  regurgitation is not visualized. No evidence of pulmonic stenosis.   Aorta: The aortic root is normal in size and structure.   Venous: The inferior vena cava is normal in size with greater than 50%  respiratory variability, suggesting right atrial pressure of 3 mmHg.   IAS/Shunts: No atrial level shunt detected by color flow Doppler.     LEFT VENTRICLE  PLAX 2D  LVIDd:     4.19 cm  LVIDs:     2.90 cm  LV PW:     1.52 cm  LV IVS:    2.12 cm  LVOT diam:   1.80 cm  LV SV:     36  LV SV Index:  16  LVOT Area:   2.54 cm     RIGHT VENTRICLE       IVC  RV Basal diam: 2.82 cm   IVC diam: 1.52 cm  RV S prime:   11.60 cm/s  TAPSE (M-mode): 2.1 cm   LEFT ATRIUM       Index    RIGHT ATRIUM      Index  LA diam:    4.10 cm 1.79 cm/m RA Area:   13.70 cm  LA Vol (A2C):  47.4 ml 20.64 ml/m RA Volume:  33.80 ml 14.72 ml/m  LA Vol (A4C):  60.6 ml 26.39 ml/m  LA Biplane Vol: 54.6 ml 23.78 ml/m  AORTIC VALVE  AV Area (Vmax):  0.46 cm  AV Area (Vmean):  0.42 cm  AV Area (VTI):   0.43 cm  AV Vmax:      402.80 cm/s  AV Vmean:      310.200 cm/s  AV VTI:      0.845 m  AV Peak Grad:   64.9 mmHg  AV Mean Grad:   42.6 mmHg  LVOT Vmax:     72.08 cm/s  LVOT Vmean:    50.675 cm/s  LVOT VTI:     0.143 m  LVOT/AV VTI ratio: 0.17    AORTA  Ao Root diam: 2.90 cm  Ao Asc diam: 3.50 cm   TRICUSPID VALVE  TR Peak grad:  10.1 mmHg  TR Vmax:    159.00 cm/s    SHUNTS  Systemic VTI: 0.14 m  Systemic Diam: 1.80 cm   Cardiac cath 07/05/19: 1. No angiographic evidence of CAD 2. Severe aortic stenosis. Cath data: mean gradient 28.88mmHg, peak to peak gradient 54 mmHg, AVA 0.9 cm2. Echo data with AVA 0.4 cm2 with DI less than .20.  Unable to obtain wedge pressure.   Fick Cardiac Output 5.14 L/min  Fick Cardiac Output Index 2.28 (L/min)/BSA  Aortic Mean Gradient 28.63 mmHg  Aortic Peak Gradient 54 mmHg  Aortic Valve Area 0.92  Aortic Value Area Index 0.41 cm2/BSA  RA A Wave 11 mmHg  RA V Wave 11 mmHg  RA Mean 11 mmHg  RV Systolic Pressure 44 mmHg  RV Diastolic Pressure 2 mmHg  RV EDP 9 mmHg  PA Systolic Pressure 38 mmHg  PA Diastolic Pressure 23 mmHg  PA  Mean 30 mmHg  AO Systolic Pressure 135 mmHg  AO Diastolic Pressure 92 mmHg  AO Mean 111 mmHg  LV Systolic Pressure 184 mmHg  LV Diastolic Pressure 15 mmHg  LV EDP 19 mmHg  AOp Systolic Pressure 132 mmHg  AOp Diastolic Pressure 82 mmHg  AOp Mean Pressure 104 mmHg  LVp Systolic Pressure 186 mmHg  LVp Diastolic Pressure 12 mmHg  LVp EDP Pressure 19 mmHg  QP/QS 1  TPVR Index 13.14 HRUI  TSVR Index 48.62 HRUI  TPVR/TSVR Ratio 0.27     Recent Labs: 11/17/2018: Magnesium 2.2 07/01/2019: BUN 13; Creatinine, Ser 0.86; Platelets 179 07/05/2019: Hemoglobin 12.6; Potassium 3.6; Sodium 144   Lipid Panel No results found for: CHOL, TRIG, HDL, CHOLHDL, VLDL, LDLCALC, LDLDIRECT   Wt Readings from Last 3 Encounters:  07/12/19 271 lb (122.9 kg)  07/05/19 270 lb (122.5 kg)  06/25/19 270 lb (122.5 kg)     Other studies  Reviewed: Additional studies/ records that were reviewed today include: echo images, cath films, office notes Review of the above records demonstrates: severe AS   Assessment and Plan:   1. Severe Aortic Valve Stenosis: She has severe, stage D aortic valve stenosis. I have personally reviewed the echo images. The aortic valve is thickened, calcified with limited leaflet mobility. I think she would benefit from AVR. Given advanced age, she is not a good candidate for conventional AVR by surgical approach. I think she may be a good candidate for TAVR.   STS Risk Score: Risk of Mortality: 2.670% Renal Failure: 1.486% Permanent Stroke: 0.534% Prolonged Ventilation: 8.664% DSW Infection: 0.211% Reoperation: 2.462% Morbidity or Mortality: 12.744% Short Length of Stay: 30.657% Long Length of Stay: 5.388%  I have reviewed the natural history of aortic stenosis with the patient and their family members  who are present today. We have discussed the limitations of medical therapy and the poor prognosis associated with symptomatic aortic stenosis. We have reviewed potential treatment options, including palliative medical therapy, conventional surgical aortic valve replacement, and transcatheter aortic valve replacement. We discussed treatment options in the context of the patient's specific comorbid medical conditions.   She would like to proceed with planning for TAVR. She has completed her cardiac cath and carotid dopplers. Risks and benefits of the valve procedure are reviewed with the patient. She will have a cardiac CT, CTA of the chest/abdomen and pelvis, PT assessment and will then be referred to see one of the CT surgeons on our TAVR team.      Current medicines are reviewed at length with the patient today.  The patient does not have concerns regarding medicines.  The following changes have been made:  no change  Labs/ tests ordered today include:  No orders of the defined types  were placed in this encounter.    Disposition:   FU with the valve team.    Signed, Verne Carrow, MD 07/12/2019 3:45 PM    [facility] [address] [place], [place], [place]  [address] Phone: [phone]; Fax: [phone]

## 2019-07-14 ENCOUNTER — Ambulatory Visit (HOSPITAL_COMMUNITY)
Admission: RE | Admit: 2019-07-14 | Discharge: 2019-07-14 | Disposition: A | Discharge status: Home / Self Care | Payer: Medicare PPO | Source: Ambulatory Visit | Attending: Physician Assistant | Admitting: Physician Assistant

## 2019-07-14 ENCOUNTER — Encounter: Payer: Self-pay | Admitting: Physical Therapy

## 2019-07-14 ENCOUNTER — Ambulatory Visit: Payer: Medicare PPO | Attending: Physician Assistant | Admitting: Physical Therapy

## 2019-07-14 ENCOUNTER — Other Ambulatory Visit: Payer: Self-pay

## 2019-07-14 DIAGNOSIS — R262 Difficulty in walking, not elsewhere classified: Secondary | ICD-10-CM | POA: Diagnosis not present

## 2019-07-14 DIAGNOSIS — I700 Atherosclerosis of aorta: Secondary | ICD-10-CM | POA: Diagnosis not present

## 2019-07-14 DIAGNOSIS — I350 Nonrheumatic aortic (valve) stenosis: Secondary | ICD-10-CM | POA: Diagnosis not present

## 2019-07-14 MED ORDER — IOHEXOL 350 MG/ML SOLN
100.0000 mL | Freq: Once | INTRAVENOUS | Status: AC | PRN
  Administered 2019-07-14: 100 mL via INTRAVENOUS

## 2019-07-14 NOTE — Therapy (Signed)
[facility] [address] [place], [place], [address] Phone: [phone]   Fax:  [phone]  Physical Therapy Evaluation/Pre-TAVR  Patient Details  Name: Cindy Richmond MRN: [id number] Date of Birth: 01-05-1951 Referring Provider (PT): Janetta Hora, [place]   Encounter Date: 07/14/2019  PT End of Session - 07/14/19 1616    Visit Number  1    PT Start Time  1230    PT Stop Time  1305    PT Time Calculation (min)  35 min    Activity Tolerance  Patient limited by fatigue    Behavior During Therapy  [facility] for tasks assessed/performed       Past Medical History:  Diagnosis Date  . Aortic stenosis   . Atrial fibrillation (HCC)   . Dyspnea    with head low, sleep apnea  . GERD (gastroesophageal reflux disease)   . H/O hiatal hernia   . Heart murmur    asymptomatic  . Hematuria   . HOH (hard of hearing)    sightly, bilaterally  . Hyperlipidemia   . Hypertension   . Hypothyroidism   . Kidney stone on left side   . Parkinson disease [facility])     Past Surgical History:  Procedure Laterality Date  . ABDOMINAL HYSTERECTOMY     lso  . CYSTOSCOPY W/ RETROGRADES  02/28/2012   Procedure: CYSTOSCOPY WITH RETROGRADE PYELOGRAM;  Surgeon: Sebastian Ache, MD;  Location: [facility];  Service: Urology;  Laterality: Right;  . CYSTOSCOPY WITH STENT PLACEMENT  02/28/2012   Procedure: CYSTOSCOPY WITH STENT PLACEMENT;  Surgeon: Sebastian Ache, MD;  Location[facility];  Service: Urology;  Laterality: Left;  . CYSTOSCOPY/RETROGRADE/URETEROSCOPY/STONE EXTRACTION WITH BASKET  02/28/2012   Procedure: CYSTOSCOPY/RETROGRADE/URETEROSCOPY/STONE EXTRACTION WITH BASKET;  Surgeon: Sebastian Ache, MD;  Location: [facility];  Service: Urology;  Laterality: Left;  . HOLMIUM LASER APPLICATION  02/28/2012   Procedure: HOLMIUM LASER APPLICATION;  Surgeon: Sebastian Ache, MD;  Location[facility];   Service: Urology;  Laterality: Left;  . KNEE ARTHROSCOPY     right  . REVERSE SHOULDER ARTHROPLASTY Left 07/22/2018   Procedure: REVERSE SHOULDER ARTHROPLASTY;  Surgeon: Bjorn Pippin, MD;  [facility];  Service: Orthopedics;  Laterality: Left;  . RIGHT/LEFT HEART CATH AND CORONARY ANGIOGRAPHY N/A 07/05/2019   Procedure: RIGHT/LEFT HEART CATH AND CORONARY ANGIOGRAPHY;  Surgeon: Kathleene Hazel, MD;  [facility];  Service: Cardiovascular;  Laterality: N/A;  . TONSILLECTOMY    . VEIN LIGATION      There were no vitals filed for this visit.   Subjective Assessment - 07/14/19 1235    Subjective  In the past few months I cannot even walk from one end of my house to the other without a rest break. Completely fatigued. I have parkinsons so I get a lot of pain and stiffness from that.    Currently in Pain?  No/denies         [facility] PT Assessment - 07/14/19 0001      Assessment   Medical Diagnosis  severe aortic stenosis    Referring Provider (PT)  Janetta Hora, PA-C    Onset Date/Surgical Date  --   a few months ago   Hand Dominance  Right      Precautions   Precautions  None      Restrictions   Weight Bearing Restrictions  No      Balance Screen  Has the patient fallen in the past 6 months  No      Home Nurse, mental health  Private residence    Living Arrangements  Alone    Additional Comments  stairs but bedroom is downstairs      Prior Function   Level of Independence  Independent    Vocation  Retired      IT consultant   Overall Cognitive Status  Within Functional Limits for tasks assessed      Sensation   Additional Comments  a little bit of numbness in my legs      Posture/Postural Control   Posture Comments  forward head, rounded shoulders      ROM / Strength   AROM / PROM / Strength  AROM;Strength      AROM   Overall AROM Comments  Rt UE WFL, Lt UE 50% due to rev TSA      Strength   Overall Strength Comments   gross 4+/5    Strength Assessment Site  Hand    Right/Left hand  Right;Left    Right Hand Grip (lbs)  30    Left Hand Grip (lbs)  35      Ambulation/Gait   Gait Comments  SPC in Rt hand       [facility] Pre-Surgical Assessment - 07/14/19 0001    5 Meter Walk Test- trial 1  5 sec    5 Meter Walk Test- trial 2  6 sec.     5 Meter Walk Test- trial 3  6 sec.    5 meter walk test average  5.67 sec    comment  score 0- unable to stand with feet side-by-side    Sit To Stand Test- trial 1  22 sec.    6 Minute Walk- Baseline  yes    BP (mmHg)  (!) 131/91    HR (bpm)  79    02 Sat (%RA)  95 %    Modified Borg Scale for Dyspnea  0- Nothing at all    Perceived Rate of Exertion (Borg)  6-    6 Minute Walk Post Test  yes    BP (mmHg)  (!) 132/93    HR (bpm)  96    02 Sat (%RA)  85 %    Modified Borg Scale for Dyspnea  7- Severe shortness of breath or very hard breathing    Perceived Rate of Exertion (Borg)  17- Very hard    Aerobic Endurance Distance Walked  380    Endurance additional comments  75% limited compared to age-related norm                Objective measurements completed on examination: See above findings.                           Plan - 07/14/19 1616    PT Frequency  One time visit       Clinical Impression Statement: Pt is a 69 yo F presenting to OP PT for evaluation prior to possible TAVR surgery due to severe aortic stenosis. Pt reports onset of SOB a few months ago. Symptoms are  limiting functional ambulation and ADLs. Pt presents with limited ROM and strength and denies musculoskeletal pain today but does have pain due to Parkinsons. Pt ambulated 108 feet and requested a rest break with 95% O2 and 81 BPM. 77 additional feet rest break 89% O2, 96 BPM. 66 additional  feet rest break 87% O2, 96 BPM. 70 additional feet rest break 87% O2, 96 BPM. 59 additional feet 87% O2 96 BPM   Pt ambulated a total of 380 feet in 6 minute walk and reported  7/10 SOB on modified scale for dyspena and 17/20 RPE on Borg's perceived exertion and pain scale at the end of the walk. During the 6 minute walk test, patient's HR increased to 96 BPM and O2 saturation decreased to 85%. Based on the Short Physical Performance Battery, patient has a frailty rating of 5/12 with </= 5/12 considered frail.  Visit Diagnosis: Difficulty in walking, not elsewhere classified     Problem List Patient Active Problem List   Diagnosis Date Noted  . Severe aortic stenosis   . Edema of both legs 09/18/2018  . Closed fracture of left proximal humerus 07/22/2018  . Parkinson's disease ([place]) 07/31/2017  . RBD (REM behavioral disorder) 07/31/2017  . Aortic stenosis, moderate to severe 04/22/2016  . Obstructive sleep apnea 04/22/2016  . Dyspnea on exertion 04/22/2016  . Night terrors, adult 04/22/2016  . Obesity, morbid ([place]) 04/22/2016    Ollie Esty C. Bryton Waight PT, DPT 07/14/19 6:09 PM   [facility] [address] [place], [place], [address] Phone: [phone]   Fax:  [phone]  Name: Cindy Richmond MRN: [id number] Date of Birth: 1950/06/02

## 2019-07-19 ENCOUNTER — Encounter: Payer: Self-pay | Admitting: Thoracic Surgery (Cardiothoracic Vascular Surgery)

## 2019-07-19 ENCOUNTER — Other Ambulatory Visit: Payer: Self-pay

## 2019-07-19 ENCOUNTER — Institutional Professional Consult (permissible substitution): Payer: Medicare PPO | Admitting: Thoracic Surgery (Cardiothoracic Vascular Surgery)

## 2019-07-19 VITALS — BP 126/86 | HR 87 | Resp 22 | Ht 64.0 in | Wt 270.2 lb

## 2019-07-19 DIAGNOSIS — I350 Nonrheumatic aortic (valve) stenosis: Secondary | ICD-10-CM

## 2019-07-19 DIAGNOSIS — I4819 Other persistent atrial fibrillation: Secondary | ICD-10-CM

## 2019-07-19 NOTE — Patient Instructions (Addendum)
Stop taking Eliquis 7 days prior to surgery  Continue taking all other medications without change through the day before surgery.  Make sure to bring all of your medications with you when you come for your Pre-Admission Testing appointment at [facility] Short[facility]Stay [facility].  Have nothing to eat or drink after midnight the night before surgery.  On the morning of surgery take only Synthroid and Nexium with a sip of water.  At your appointment for Pre-Admission Testing at the [facility]Stay Department you will be asked to sign permission forms for your upcoming surgery.  By definition your signature on these forms implies that you and/or your designee provide full informed consent for your planned surgical procedure(s), that alternative treatment options have been discussed, that you understand and accept any and all potential risks, and that you have some understanding of what to expect for your post-operative convalescence.  For any major cardiac surgical procedure potential operative risks include but are not limited to at least some risk of death, stroke or other neurologic complication, myocardial infarction, congestive heart failure, respiratory failure, renal failure, bleeding requiring blood transfusion and/or reexploration, irregular heart rhythm, heart block or bradycardia requiring permanent pacemaker, pneumonia, pericardial effusion, pleural effusion, wound infection, pulmonary embolus or other thromboembolic complication, chronic pain, or other complications related to the specific procedure(s) performed.  For transcatheter aortic valve replacement additional risks include but are not limited to risk of paravalvular leak, valve embolization, valve thrombosis, aortic dissection, aortic rupture, ventricular septal defect or perforation, pericardial tamponade, injury of the abdominal aorta or its branches, and/or injury or occlusion of the arteries  going to your arms or legs.  Please call to schedule a follow-up appointment in our office prior to surgery if you have any unresolved questions about your planned surgical procedure, the associated risks, alternative treatment options, and/or expectations for your post-operative recovery.

## 2019-07-19 NOTE — Progress Notes (Signed)
[facility]  MULTIDISCIPLINARY HEART VALVE CLINIC  CARDIOTHORACIC SURGERY CONSULTATION REPORT  Referring Provider is Richmond, Cindy S, MD PCP is Richmond, Sharon, MD  Chief Complaint  Patient presents with  . Aortic Stenosis    Surgical eval for TAVR, review all testing/studies     HPI:  Patient is a 69-year-old obese female with history of aortic stenosis, recently diagnosed persistent atrial fibrillation on long-term anticoagulation, hypertension, hyperlipidemia, hypothyroidism, and Parkinson's disease with limited physical mobility who has been referred for surgical consultation to discuss treatment options for management of severe symptomatic aortic stenosis.  Patient states that she has known of presence of a heart murmur for several years.  She denies any known history of rheumatic fever or rheumatic heart disease.  She has been followed by Dr. Crenshaw with known history of aortic stenosis that has gradually progressed on follow-up echocardiography.  The patient states that over the last 2 to 3 years she has developed worsening exertional shortness of breath and fatigue.  She now gets short of breath with very low level activity and she feels as though she just cannot do much of anything without getting tired or winded.  She denies resting shortness of breath, PND, dizzy spells, or syncope.  She has some orthopnea.  She was recently noted to be in atrial fibrillation having previously always been in sinus rhythm.  She is now on Eliquis for long-term anticoagulation.  Follow-up echocardiogram performed May 11, 2019 revealed normal left ventricular systolic function with ejection fraction estimated 60 to 65%.  There was severe aortic stenosis.  Peak velocity across aortic valve measured 4.0 m/s corresponding to mean transvalvular gradient estimated 42 mmHg and aortic valve area calculated only 0.43 cm.  The DVI was notably quite low at 0.17.  The patient was referred to the  multidisciplinary heart valve clinic and underwent diagnostic cardiac catheterization on Jul 05, 2019.  Catheterization revealed normal coronary artery anatomy with no significant coronary artery disease.  Catheterization confirmed the presence of severe aortic stenosis with peak to peak and mean transvalvular gradients measured 54 and 28.6 mmHg, corresponding to aortic valve area calculated 0.9 cm.  Pulmonary artery pressures were mildly elevated.  CT angiography was performed and the patient referred for surgical consultation.  Patient is widowed and lives alone locally in [place].  She is a retired elementary school teacher.  She lives a very sedentary lifestyle.  Her mobility is limited because of Parkinson's disease and obesity.  She ambulates using a cane but can only go for short distances.  She gets short of breath with low-level activity.  She denies resting shortness of breath.  She denies any exertional chest tightness.  She has not had palpitations or syncope.  Past Medical History:  Diagnosis Date  . Aortic stenosis   . Atrial fibrillation (HCC)   . Atrial fibrillation, persistent (HCC)   . Dyspnea    with head low, sleep apnea  . GERD (gastroesophageal reflux disease)   . H/O hiatal hernia   . Heart murmur    asymptomatic  . Hematuria   . HOH (hard of hearing)    sightly, bilaterally  . Hyperlipidemia   . Hypertension   . Hypothyroidism   . Kidney stone on left side   . Parkinson disease (HCC)     Past Surgical History:  Procedure Laterality Date  . ABDOMINAL HYSTERECTOMY     lso  . CYSTOSCOPY W/ RETROGRADES  02/28/2012   Procedure: CYSTOSCOPY WITH RETROGRADE PYELOGRAM;    Surgeon: Cindy Manny, MD;  Location: [facility];  Service: Urology;  Laterality: Right;  . CYSTOSCOPY WITH STENT PLACEMENT  02/28/2012   Procedure: CYSTOSCOPY WITH STENT PLACEMENT;  Surgeon: Cindy Manny, MD;  Location: [facility];  Service: Urology;  Laterality:  Left;  . CYSTOSCOPY/RETROGRADE/URETEROSCOPY/STONE EXTRACTION WITH BASKET  02/28/2012   Procedure: CYSTOSCOPY/RETROGRADE/URETEROSCOPY/STONE EXTRACTION WITH BASKET;  Surgeon: Cindy Manny, MD;  Location: [facility];  Service: Urology;  Laterality: Left;  . HOLMIUM LASER APPLICATION  02/28/2012   Procedure: HOLMIUM LASER APPLICATION;  Surgeon: Cindy Manny, MD;  Location:  SURGERY CENTER;  Service: Urology;  Laterality: Left;  . KNEE ARTHROSCOPY     right  . REVERSE SHOULDER ARTHROPLASTY Left 07/22/2018   Procedure: REVERSE SHOULDER ARTHROPLASTY;  Surgeon: Richmond, Cindy T, MD;  Location: [facility];  Service: Orthopedics;  Laterality: Left;  . RIGHT/LEFT HEART CATH AND CORONARY ANGIOGRAPHY N/A 07/05/2019   Procedure: RIGHT/LEFT HEART CATH AND CORONARY ANGIOGRAPHY;  Surgeon: Richmond, Cindy D, MD;  Location: [facility];  Service: Cardiovascular;  Laterality: N/A;  . TONSILLECTOMY    . VEIN LIGATION      Family History  Problem Relation Age of Onset  . Heart failure Father   . Healthy Sister   . Healthy Son     Social History   Socioeconomic History  . Marital status: Widowed    Spouse name: Not on file  . Number of children: 3  . Years of education: Not on file  . Highest education level: Associate degree: occupational, technical, or vocational program  Occupational History  . 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 Smoking status: Never Smoker  . Smokeless tobacco: Never Used  Substance and Sexual Activity  . Alcohol use: Never  . Drug use: Never  . Sexual activity: Not on file  Other Topics Concern  . Not on file  Social History Narrative  . Not on file   Social Determinants of Health   Financial Resource Strain:   . Difficulty of Paying Living Expenses:   Food Insecurity:   . Worried About Running Out of Food in the Last Year:   . Ran Out of Food in the Last Year:   Transportation Needs:   . Lack of  Transportation (Medical):   . Lack of Transportation (Non-Medical):   Physical Activity:   . Days of Exercise per Week:   . Minutes of Exercise per Session:   Stress:   . Feeling of Stress :   Social Connections:   . Frequency of Communication with Friends and Family:   . Frequency of Social Gatherings with Friends and Family:   . Attends Religious Services:   . Active Member of Clubs or Organizations:   . Attends Club or Organization Meetings:   . Marital Status:   Intimate Partner Violence:   . Fear of Current or Ex-Partner:   . Emotionally Abused:   . Physically Abused:   . Sexually Abused:     Current Outpatient Medications  Medication Sig Dispense Refill  . apixaban (ELIQUIS) 5 MG TABS tablet Take 1 tablet (5 mg total) by mouth 2 (two) times daily. 60 tablet 6  . bisoprolol-hydrochlorothiazide (ZIAC) 5-6.25 MG tablet Take 1 tablet by mouth daily.    . brimonidine (ALPHAGAN) 0.15 % ophthalmic solution Place 1 drop into the left eye daily.     . carbidopa-levodopa (SINEMET IR) 25-100 MG tablet 2 TABLETS AT 8AM/2 TABLETS AT   NOON/1 TABLET AT 4 PM/1 TABLET AT 8PM (Patient taking differently: Take 1-2 tablets by mouth See admin instructions. 2 tablets at 8am/2 tablets at noon/1 tablet at 4 pm/1 tablet at 8pm) 540 tablet 1  . cholecalciferol (VITAMIN D) 25 MCG (1000 UNIT) tablet Take 1,000 Units by mouth daily.    . Collagen-Boron-Hyaluronic Acid ([facility] JO[facility] TRIPLE ACTION PO) Take 1 tablet by mouth daily. Triple Action Joint Health    . Cyanocobalamin (VITAMIN B-12) 5000 MCG TBDP Take 5,000 mcg by mouth daily.    . docusate sodium (COLACE) 100 MG capsule Take 200 mg by mouth at bedtime.    . esomeprazole (NEXIUM) 20 MG capsule Take 20 mg by mouth daily at 12 noon.     . levothyroxine (SYNTHROID, LEVOTHROID) 75 MCG tablet Take 75 mcg by mouth daily before breakfast.     . Magnesium 250 MG TABS Take 250 mg by mouth at bedtime.     . Multiple Vitamins-Minerals (ANTIOXIDANT PO) Take  1 tablet by mouth daily at 6 (six) AM. MitoQ Eye (MitoQ w/Lutein, Zeaxanthin, Bilberry and Maritime Pine Bark Extract)    . Multiple Vitamins-Minerals (PRESERVISION AREDS PO) Take 1 tablet by mouth daily.     . Omega-3 Fatty Acids (FISH OIL) 1200 MG CAPS Take 1,200 mg by mouth daily.    . Polyethyl Glycol-Propyl Glycol (SYSTANE OP) Place 1 drop into both eyes 3 (three) times daily as needed (dry eyes).     . Polyethyl Glycol-Propyl Glycol (SYSTANE) 0.4-0.3 % GEL ophthalmic gel Place 1 application into both eyes daily as needed (dry/irritated eyes.).     Current Facility-Administered Medications  Medication Dose Route Frequency Provider Last Rate Last Admin  . sodium chloride flush (NS) 0.9 % injection 3 mL  3 mL Intravenous Q12H Richmond, Cindy S, MD        Allergies  Allergen Reactions  . Penicillins Other (See Comments)    unknown Did it involve swelling of the face/tongue/throat, SOB, or low BP? Unknown Did it involve sudden or severe rash/hives, skin peeling, or any reaction on the inside of your mouth or nose? Unknown Did you need to seek medical attention at a hospital or doctor's office? Unknown When did it last happen?newborn allergy If all above answers are "NO", may proceed with cephalosporin use.       Review of Systems:   General:  normal appetite, decreased energy, no weight gain, no weight loss, no fever  Cardiac:  no chest pain with exertion, no chest pain at rest, +SOB with exertion, no resting SOB, no PND, + orthopnea, no palpitations, + arrhythmia, + atrial fibrillation, no LE edema, no dizzy spells, no syncope  Respiratory:  + shortness of breath, no home oxygen, no productive cough, no dry cough, no bronchitis, n0 wheezing, no hemoptysis, no asthma, no pain with inspiration or cough, + sleep apnea, no CPAP at night  GI:   no difficulty swallowing, no reflux, no frequent heartburn, + hiatal hernia, no abdominal pain, no constipation, no diarrhea, no  hematochezia, no hematemesis, no melena  GU:   no dysuria,  no frequency, no urinary tract infection, no hematuria, no  kidney stones, no kidney disease  Vascular:  no pain suggestive of claudication, no pain in feet, no leg cramps, + varicose veins, no DVT, no non-healing foot ulcer  Neuro:   no stroke, no TIA's, no seizures, no headaches, no temporary blindness one eye,  no slurred speech, no peripheral neuropathy, no chronic pain, + instability of   gait, no memory/cognitive dysfunction  Musculoskeletal: + arthritis, no joint swelling, no myalgias, + difficulty walking, limited mobility   Skin:   no rash, no itching, no skin infections, no pressure sores or ulcerations  Psych:   no anxiety, no depression, no nervousness, no unusual recent stress  Eyes:   + blurry vision, no floaters, + recent vision changes, + wears glasses or contacts  ENT:   no hearing loss, no loose or painful teeth, no dentures, last saw dentist 11/2018  Hematologic:  no easy bruising, no abnormal bleeding, no clotting disorder, no frequent epistaxis  Endocrine:  no diabetes, does not check CBG's at home           Physical Exam:   BP 126/86 (BP Location: Left Arm, Patient Position: Sitting, Cuff Size: Large)   Pulse 87   Resp (!) 22   Ht 5' 4" (1.626 m)   Wt 270 lb 3.2 oz (122.6 kg)   SpO2 94% Comment: RA  BMI 46.38 kg/m   General:  Obese,  well-appearing  HEENT:  Unremarkable   Neck:   no JVD, no bruits, no adenopathy   Chest:   clear to auscultation, symmetrical breath sounds, no wheezes, no rhonchi   CV:   RRR, grade III/VI crescendo/decrescendo murmur heard best at RSB,  no diastolic murmur  Abdomen:  soft, non-tender, no masses   Extremities:  warm, well-perfused, pulses not palpable, no LE edema  Rectal/GU  Deferred  Neuro:   Grossly non-focal and symmetrical throughout  Skin:   Clean and dry, no rashes, no breakdown   Diagnostic Tests:  ECHOCARDIOGRAM REPORT       Patient Name:  Cindy Richmond Date of Exam: 05/11/2019  Medical Rec #: [id number]   Height:    65.0 in  Accession #:  [id number]  Weight:    284.0 lb  Date of Birth: 06/01/1950   BSA:     2.296 m  Patient Age:  69 years   BP:      108/67 mmHg  Patient Gender: F       HR:      74 bpm.  Exam Location: [place]   Procedure: Cardiac Doppler and Color Doppler   Indications:  Aortic Stenosis    History:    Patient has prior history of Echocardiogram examinations,  most         recent 07/16/2018. Aortic Valve Disease,  Signs/Symptoms:Dyspnea         and Murmur; Risk Factors:Hypertension and Dyslipidemia.    Sonographer:  Joseph Mujalli RDCS (AE)  Referring Phys: [id number] Cindy Richmond     Sonographer Comments: Patient is morbidly obese.  IMPRESSIONS    1. Left ventricular ejection fraction, by estimation, is 60 to 65%. The  left ventricle has normal function. The left ventricle has no regional  wall motion abnormalities. There is moderate left ventricular hypertrophy.  Left ventricular diastolic function  could not be evaluated.  2. The mitral valve is normal in structure. No evidence of mitral valve  regurgitation. No evidence of mitral stenosis.  3. Aortic valve regurgitation is not visualized. Severe aortic valve  stenosis.  4. The inferior vena cava is normal in size with greater than 50%  respiratory variability, suggesting right atrial pressure of 3 mmHg.   FINDINGS  Left Ventricle: Left ventricular ejection fraction, by estimation, is 60  to 65%. The left ventricle has normal function. The left ventricle has no  regional wall motion abnormalities. The left ventricular   internal cavity  size was normal in size. There is  moderate left ventricular hypertrophy. Left ventricular diastolic  function could not be evaluated.   Right Ventricle: The right ventricular size is normal. No increase in  right ventricular wall  thickness. Right ventricular systolic function is  normal. There is normal pulmonary artery systolic pressure. The tricuspid  regurgitant velocity is 1.59 m/s, and  with an assumed right atrial pressure of 8 mmHg, the estimated right  ventricular systolic pressure is 18.1 mmHg.   Left Atrium: Left atrial size was normal in size.   Right Atrium: Right atrial size was normal in size.   Pericardium: There is no evidence of pericardial effusion.   Mitral Valve: The mitral valve is normal in structure. Normal mobility of  the mitral valve leaflets. No evidence of mitral valve regurgitation. No  evidence of mitral valve stenosis.   Tricuspid Valve: The tricuspid valve is normal in structure. Tricuspid  valve regurgitation is not demonstrated. No evidence of tricuspid  stenosis.   Aortic Valve: The aortic valve is abnormal. Aortic valve regurgitation is  not visualized. Severe aortic stenosis is present. Aortic valve mean  gradient measures 42.6 mmHg. Aortic valve peak gradient measures 64.9  mmHg. Aortic valve area, by VTI measures  0.43 cm.   Pulmonic Valve: The pulmonic valve was normal in structure. Pulmonic valve  regurgitation is not visualized. No evidence of pulmonic stenosis.   Aorta: The aortic root is normal in size and structure.   Venous: The inferior vena cava is normal in size with greater than 50%  respiratory variability, suggesting right atrial pressure of 3 mmHg.   IAS/Shunts: No atrial level shunt detected by color flow Doppler.     LEFT VENTRICLE  PLAX 2D  LVIDd:     4.19 cm  LVIDs:     2.90 cm  LV PW:     1.52 cm  LV IVS:    2.12 cm  LVOT diam:   1.80 cm  LV SV:     36  LV SV Index:  16  LVOT Area:   2.54 cm     RIGHT VENTRICLE       IVC  RV Basal diam: 2.82 cm   IVC diam: 1.52 cm  RV S prime:   11.60 cm/s  TAPSE (M-mode): 2.1 cm   LEFT ATRIUM       Index    RIGHT ATRIUM      Index  LA diam:     4.10 cm 1.79 cm/m RA Area:   13.70 cm  LA Vol (A2C):  47.4 ml 20.64 ml/m RA Volume:  33.80 ml 14.72 ml/m  LA Vol (A4C):  60.6 ml 26.39 ml/m  LA Biplane Vol: 54.6 ml 23.78 ml/m  AORTIC VALVE  AV Area (Vmax):  0.46 cm  AV Area (Vmean):  0.42 cm  AV Area (VTI):   0.43 cm  AV Vmax:      402.80 cm/s  AV Vmean:     310.200 cm/s  AV VTI:      0.845 m  AV Peak Grad:   64.9 mmHg  AV Mean Grad:   42.6 mmHg  LVOT Vmax:     72.08 cm/s  LVOT Vmean:    50.675 cm/s  LVOT VTI:     0.143 m  LVOT/AV VTI ratio: 0.17    AORTA  Ao Root diam: 2.90 cm  Ao Asc diam: 3.50 cm   TRICUSPID VALVE  TR Peak grad:  10.1 mmHg    TR Vmax:    159.00 cm/s    SHUNTS  Systemic VTI: 0.14 m  Systemic Diam: 1.80 cm   Rajan Revankar MD  Electronically signed by Rajan Revankar MD  Signature Date/Time: 05/12/2019/12:00:09 PM      RIGHT/LEFT HEART CATH AND CORONARY ANGIOGRAPHY  Conclusion  1. No angiographic evidence of CAD 2. Severe aortic stenosis. Cath data: mean gradient 28.6mmHg, peak to peak gradient 54 mmHg, AVA 0.9 cm2. Echo data with AVA 0.4 cm2 with DI less than .20.  Unable to obtain wedge pressure.   Recommendations: Will continue workup for TAVR. Of note, she is in atrial fibrillation today. Reviewed with Dr. Crenshaw as this is new for her. Will start Eliquis tomorrow.   Recommendations  Antiplatelet/Anticoag Will continue workup for TAVR.  Indications  Severe aortic stenosis [I35.0 (ICD-10-CM)]  Procedural Details  Technical Details Indication: 69 yo female with severe aortic stenosis.   Procedure: The risks, benefits, complications, treatment options, and expected outcomes were discussed with the patient. The patient and/or family concurred with the proposed plan, giving informed consent. The patient was brought to the cath lab after IV hydration was given. The patient was sedated with Versed and Fentanyl. The IV catheter  present in the right antecubital vein was prepped, draped and changed for a 18 French sheath. Right heart cath performed with a balloon tipped catheter. The right wrist was prepped and draped in a sterile fashion. 1% lidocaine was used for local anesthesia. Using the modified Seldinger access technique, a 5 French sheath was placed in the right radial artery. 3 mg Verapamil was given through the sheath. 6000 units IV heparin was given. Standard diagnostic catheters were used to perform selective coronary angiography. I crossed the aortic valve with an AL-1 and a straight wire. No LV gram performed. The sheath was removed from the right radial artery and a Terumo hemostasis band was applied at the arteriotomy site on the right wrist.    Estimated blood loss <50 mL.   During this procedure medications were administered to achieve and maintain moderate conscious sedation while the patient's heart rate, blood pressure, and oxygen saturation were continuously monitored and I was present face-to-face 100% of this time.  Medications (Filter: Administrations occurring from 07/05/19 0926 to 07/05/19 1002) (important)  Continuous medications are totaled by the amount administered until 07/05/19 1002.  midazolam (VERSED) injection (mg) Total dose:  1 mg Date/Time  Rate/Dose/Volume Action  07/05/19 0929  1 mg Given    fentaNYL (SUBLIMAZE) injection (mcg) Total dose:  25 mcg Date/Time  Rate/Dose/Volume Action  07/05/19 0929  25 mcg Given    lidocaine (PF) (XYLOCAINE) 1 % injection (mL) Total volume:  4 mL Date/Time  Rate/Dose/Volume Action  07/05/19 0936  2 mL Given  0937  2 mL Given    Radial Cocktail/Verapamil only (mL) Total volume:  10 mL Date/Time  Rate/Dose/Volume Action  07/05/19 0937  10 mL Given    heparin sodium (porcine) injection (Units) Total dose:  6,000 Units Date/Time  Rate/Dose/Volume Action  07/05/19 0947  6,000 Units Given    Heparin (Porcine) in NaCl 1000-0.9 UT/500ML-% SOLN  (mL) Total volume:  1,000 mL Date/Time  Rate/Dose/Volume Action  07/05/19 0947  500 mL Given  0947  500 mL Given    iohexol (OMNIPAQUE) 350 MG/ML injection (mL) Total volume:  50 mL Date/Time  Rate/Dose/Volume Action  07/05/19 1000  50 mL Given    Sedation Time  Sedation Time Physician-1: 29 minutes 1 second  Contrast    Medication Name Total Dose  iohexol (OMNIPAQUE) 350 MG/ML injection 50 mL    Radiation/Fluoro  Fluoro time: 7.6 (min) DAP: 22102 (mGycm2) Cumulative Air Kerma: 371 (mGy)  Complications  Complications documented before study signed (07/05/2019 10:18 AM)   RIGHT/LEFT HEART CATH AND CORONARY ANGIOGRAPHY  None Documented by Richmond, Cindy D, MD 07/05/2019 10:08 AM  Date Found: 07/05/2019  Time Range: Intraprocedure      Coronary Findings  Diagnostic Dominance: Right Left Anterior Descending  Vessel is large.  Left Circumflex  Vessel is large.  Right Coronary Artery  Vessel is large.  Intervention  No interventions have been documented. Coronary Diagrams  Diagnostic Dominance: Right  Intervention  Implants   No implant documentation for this case.  Syngo Images  Show images for CARDIAC CATHETERIZATION  Images on Long Term Storage  Show images for Richmond, Cindy P   Link to Procedure Log  Procedure Log    Hemo Data   Most Recent Value  Fick Cardiac Output 5.14 L/min  Fick Cardiac Output Index 2.28 (L/min)/BSA  Aortic Mean Gradient 28.63 mmHg  Aortic Peak Gradient 54 mmHg  Aortic Valve Area 0.92  Aortic Value Area Index 0.41 cm2/BSA  RA A Wave 11 mmHg  RA V Wave 11 mmHg  RA Mean 11 mmHg  RV Systolic Pressure 44 mmHg  RV Diastolic Pressure 2 mmHg  RV EDP 9 mmHg  PA Systolic Pressure 38 mmHg  PA Diastolic Pressure 23 mmHg  PA Mean 30 mmHg  AO Systolic Pressure 135 mmHg  AO Diastolic Pressure 92 mmHg  AO Mean 111 mmHg  LV Systolic Pressure 184 mmHg  LV Diastolic Pressure 15 mmHg  LV EDP 19 mmHg  AOp Systolic Pressure  132 mmHg  AOp Diastolic Pressure 82 mmHg  AOp Mean Pressure 104 mmHg  LVp Systolic Pressure 186 mmHg  LVp Diastolic Pressure 12 mmHg  LVp EDP Pressure 19 mmHg  QP/QS 1  TPVR Index 13.14 HRUI  TSVR Index 48.62 HRUI  TPVR/TSVR Ratio 0.27     EKG (07/05/2019): Atrial fibrillation    Cardiac TAVR CT  TECHNIQUE: The patient was scanned on a Siemens Force 192 slice scanner. A 120 kV retrospective scan was triggered in the descending thoracic aorta at 111 HU's. Gantry rotation speed was 270 msecs and collimation was .9 mm. No beta blockade or nitro were given. The 3D data set was reconstructed in 5% intervals of the R-R cycle. Systolic and diastolic phases were analyzed on a dedicated work station using MPR, MIP and VRT modes. The patient received 80 cc of contrast.  FINDINGS: Aortic Valve: Tri leaflet calcified with restricted leaflet motion  Aorta: Normal arch vessels no aneurysm minimal atherosclerosis  Sinotubular Junction: 24 mm  Ascending Thoracic Aorta: 34 mm  Aortic Arch: 32 mm  Descending Thoracic Aorta: 23 mm  Sinus of Valsalva Measurements:  Non-coronary: 29.7 mm  Right - coronary: 28.3 mm  Left - coronary: 29.9 mm  Coronary Artery Height above Annulus:  Left Main: 10.6 mm above annulus  Right Coronary: 11.2 mm above annulus  Virtual Basal Annulus Measurements:  Maximum/Minimum Diameter: 27.7 mm x 21.2 mm  Perimeter: 80 mm2  Area: 479 mm2  Coronary Arteries: Sufficient height above annulus for deployment  Optimum Fluoroscopic Angle for Delivery: LAO 2 Caudal 2 degrees  IMPRESSION: 1. Calcified tri leaflet AV with annular area of 479 mm2 suitable for a 26 mm Sapien 3 valve  2.  Normal aortic root diameter 3.4 cm  3.  Optimum angiographic angle   for deployment LAO 2 Caudal 2 degrees  4.  Coronary arteries sufficient height above annulus for deployment  Peter Nishan   Electronically Signed   By: Peter  Nishan  M.D.   On: 07/14/2019 13:27    CT ANGIOGRAPHY CHEST, ABDOMEN AND PELVIS  TECHNIQUE: Non-contrast CT of the chest was initially obtained.  Multidetector CT imaging through the chest, abdomen and pelvis was performed using the standard protocol during bolus administration of intravenous contrast. Multiplanar reconstructed images and MIPs were obtained and reviewed to evaluate the vascular anatomy.  CONTRAST:  100mL OMNIPAQUE IOHEXOL 350 MG/ML SOLN  COMPARISON:  None.  FINDINGS: CTA CHEST FINDINGS  Cardiovascular: Mild cardiomegaly. No significant pericardial effusion/thickening. Diffuse thickening and coarse calcification of the aortic valve. Mildly atherosclerotic nonaneurysmal thoracic aorta. Normal caliber pulmonary arteries. No central pulmonary emboli.  Mediastinum/Nodes: No discrete thyroid nodules. Unremarkable esophagus. No axillary adenopathy. Mildly enlarged 1.1 cm right paratracheal node (series 4/image 26). No additional pathologically enlarged mediastinal nodes. No hilar adenopathy.  Lungs/Pleura: No pneumothorax. No pleural effusion. Moderate elevation of the right hemidiaphragm with associated passive right lung base atelectasis. No consolidative airspace disease, lung masses or significant pulmonary nodules.  Musculoskeletal: No aggressive appearing focal osseous lesions. Partially visualized left shoulder arthroplasty. Mild thoracic spondylosis.  CTA ABDOMEN AND PELVIS FINDINGS  Hepatobiliary: Normal liver size. Subcentimeter hypodense left liver lesion is too small to characterize and requires no follow-up unless the patient has risk factors for liver malignancy. No additional liver lesions. Normal gallbladder with no radiopaque cholelithiasis. No biliary ductal dilatation.  Pancreas: Normal, with no mass or duct dilation.  Spleen: Normal size spleen. Coarse granulomatous splenic calcification. No splenic masses.  Adrenals/Urinary  Tract: No discrete adrenal nodules. No hydronephrosis. Simple 2.0 cm posterior upper right renal cyst. Otherwise no contour deforming renal lesions. Normal nondistended bladder.  Stomach/Bowel: Small hiatal hernia. Otherwise normal nondistended stomach. Normal caliber small bowel with no small bowel wall thickening. Normal appendix. Mild sigmoid diverticulosis. No large bowel wall thickening or significant pericolonic fat stranding.  Vascular/Lymphatic: Atherosclerotic nonaneurysmal abdominal aorta. No pathologically enlarged lymph nodes in the abdomen or pelvis.  Reproductive: Status post hysterectomy, with no abnormal findings at the vaginal cuff. No adnexal mass.  Other: No pneumoperitoneum, ascites or focal fluid collection.  Musculoskeletal: No aggressive appearing focal osseous lesions. Marked lumbar spondylosis.  VASCULAR MEASUREMENTS PERTINENT TO TAVR:  AORTA:  Minimal Aortic Diameter-15.0 x 12.8 mm  Severity of Aortic Calcification-mild  RIGHT PELVIS:  Right Common Iliac Artery -  Minimal Diameter-10.7 x 8.6 mm  Tortuosity-mild  Calcification-mild  Right External Iliac Artery -  Minimal Diameter-6.1 x 5.5 mm  Tortuosity-moderate  Calcification-none  Right Common Femoral Artery -  Minimal Diameter-7.2 x 6.9 mm  Tortuosity-mild  Calcification-none  LEFT PELVIS:  Left Common Iliac Artery -  Minimal Diameter-9.5 x 9.0 mm  Tortuosity-mild  Calcification-mild  Left External Iliac Artery -  Minimal Diameter-6.6 x 6.2 mm  Tortuosity-moderate  Calcification-none  Left Common Femoral Artery -  Minimal Diameter-7.5 x 6.7 mm  Tortuosity-mild  Calcification-none  Review of the MIP images confirms the above findings.  IMPRESSION: 1. Vascular findings and measurements pertinent to potential TAVR procedure, as detailed. 2. Diffuse thickening and coarse calcification of the aortic valve, compatible  with the reported history of severe aortic stenosis. 3. Mild cardiomegaly. 4. Mild right paratracheal lymphadenopathy, requiring no follow-up unless the patient has a history of malignancy. This recommendation follows ACR consensus guidelines: Managing Incidental Findings on Thoracic CT: Mediastinal and Cardiovascular Findings.   A White Paper of the ACR Incidental Findings Committee. J Am Coll Radiol. 2018; 15: 1087-1096. 5. Small hiatal hernia. 6. Mild sigmoid diverticulosis. 7.  Aortic Atherosclerosis (ICD10-I70.0).   Electronically Signed   By: Jason A Poff M.D.   On: 07/14/2019 12:38    Impression:  Patient has stage D severe symptomatic aortic stenosis.  She describes a slow gradual decline with worsening symptoms of exertional shortness of breath and fatigue consistent with chronic diastolic congestive heart failure, [place] Heart Association functional class III.  Symptoms of also progressed in the setting of recently discovered persistent atrial fibrillation.  I have personally reviewed the patient's recent transthoracic echocardiogram, diagnostic cardiac catheterization, and CT angiograms.  Echocardiogram reveals preserved left ventricular systolic function with severe aortic stenosis.  Aortic valve appears trileaflet.  There is severe thickening, calcification, and restricted leaflet mobility involving all 3 leaflets.  Peak velocity across the aortic valve measured greater than 4.0 m/s corresponding to mean transvalvular gradient estimated 42 mmHg.  Diagnostic cardiac catheterization confirmed the presence of severe aortic stenosis and revealed normal coronary artery anatomy with no significant coronary artery disease.  There is mild pulmonary hypertension.  I agree the patient needs aortic valve replacement.  She might benefit from concomitant Maze procedure.  However, risks associated with conventional surgery would be somewhat elevated because of the patient's age, obesity,  Parkinson's disease, and severe physical deconditioning with significantly limited physical mobility.  Cardiac-gated CTA of the heart reveals anatomical characteristics consistent with aortic stenosis suitable for treatment by transcatheter aortic valve replacement without any significant complicating features and CTA of the aorta and iliac vessels demonstrate what appears to be adequate pelvic vascular access to facilitate a transfemoral approach.  EKG reveals atrial fibrillation with controlled ventricular rate and no significant intraventricular conduction delay.    Plan:  The patient was counseled at length regarding treatment alternatives for management of severe symptomatic aortic stenosis. Alternative approaches such as conventional aortic valve replacement, transcatheter aortic valve replacement, and continued medical therapy without intervention were compared and contrasted at length.  The risks associated with conventional surgical aortic valve replacement were discussed in detail, as were expectations for post-operative convalescence, and why I would be reluctant to consider this patient a candidate for conventional surgery.  Issues specific to transcatheter aortic valve replacement were discussed including questions about long term valve durability, the potential for paravalvular leak, possible increased risk of need for permanent pacemaker placement, and other technical complications related to the procedure itself.  Long-term prognosis with medical therapy was discussed. This discussion was placed in the context of the patient's own specific clinical presentation and past medical history.  All of their questions have been addressed.  The patient desires to proceed with transcatheter aortic valve replacement in the near future.   We tentatively plan to proceed on August 03, 2019.  Following the decision to proceed with transcatheter aortic valve replacement, a discussion has been held regarding  what types of management strategies would be attempted intraoperatively in the event of life-threatening complications, including whether or not the patient would be considered a candidate for the use of cardiopulmonary bypass and/or conversion to open sternotomy for attempted surgical intervention.  The patient specifically requests that should a potentially life-threatening complication develop we would attempt emergency median sternotomy and/or other aggressive surgical procedures.  The patient has been advised of a variety of complications that might develop including but not limited to risks of death, stroke, paravalvular leak, aortic dissection or other major   vascular complications, aortic annulus rupture, device embolization, cardiac rupture or perforation, mitral regurgitation, acute myocardial infarction, arrhythmia, heart block or bradycardia requiring permanent pacemaker placement, congestive heart failure, respiratory failure, renal failure, pneumonia, infection, other late complications related to structural valve deterioration or migration, or other complications that might ultimately cause a temporary or permanent loss of functional independence or other long term morbidity.  The patient provides full informed consent for the procedure as described and all questions were answered.      I spent in excess of 90 minutes during the conduct of this office consultation and >50% of this time involved direct face-to-face encounter with the patient for counseling and/or coordination of their care.      Serenah Mill H. Char Feltman, MD 07/19/2019 11:21 AM   

## 2019-07-19 NOTE — H&P (View-Only) (Signed)
[facility]  MULTIDISCIPLINARY HEART VALVE CLINIC  CARDIOTHORACIC SURGERY CONSULTATION REPORT  Referring Provider is Crenshaw, Madolyn Frieze, MD PCP is Mila Palmer, MD  Chief Complaint  Patient presents with  . Aortic Stenosis    Surgical eval for TAVR, review all testing/studies     HPI:  Patient is a 69 year old obese female with history of aortic stenosis, recently diagnosed persistent atrial fibrillation on long-term anticoagulation, hypertension, hyperlipidemia, hypothyroidism, and Parkinson's disease with limited physical mobility who has been referred for surgical consultation to discuss treatment options for management of severe symptomatic aortic stenosis.  Patient states that she has known of presence of a heart murmur for several years.  She denies any known history of rheumatic fever or rheumatic heart disease.  She has been followed by Dr. Jens Som with known history of aortic stenosis that has gradually progressed on follow-up echocardiography.  The patient states that over the last 2 to 3 years she has developed worsening exertional shortness of breath and fatigue.  She now gets short of breath with very low level activity and she feels as though she just cannot do much of anything without getting tired or winded.  She denies resting shortness of breath, PND, dizzy spells, or syncope.  She has some orthopnea.  She was recently noted to be in atrial fibrillation having previously always been in sinus rhythm.  She is now on Eliquis for long-term anticoagulation.  Follow-up echocardiogram performed May 11, 2019 revealed normal left ventricular systolic function with ejection fraction estimated 60 to 65%.  There was severe aortic stenosis.  Peak velocity across aortic valve measured 4.0 m/s corresponding to mean transvalvular gradient estimated 42 mmHg and aortic valve area calculated only 0.43 cm.  The DVI was notably quite low at 0.17.  The patient was referred to the  multidisciplinary heart valve clinic and underwent diagnostic cardiac catheterization on Jul 05, 2019.  Catheterization revealed normal coronary artery anatomy with no significant coronary artery disease.  Catheterization confirmed the presence of severe aortic stenosis with peak to peak and mean transvalvular gradients measured 54 and 28.6 mmHg, corresponding to aortic valve area calculated 0.9 cm.  Pulmonary artery pressures were mildly elevated.  CT angiography was performed and the patient referred for surgical consultation.  Patient is widowed and lives alone locally in [place].  She is a retired Tourist information centre manager.  She lives a very sedentary lifestyle.  Her mobility is limited because of Parkinson's disease and obesity.  She ambulates using a cane but can only go for short distances.  She gets short of breath with low-level activity.  She denies resting shortness of breath.  She denies any exertional chest tightness.  She has not had palpitations or syncope.  Past Medical History:  Diagnosis Date  . Aortic stenosis   . Atrial fibrillation (HCC)   . Atrial fibrillation, persistent (HCC)   . Dyspnea    with head low, sleep apnea  . GERD (gastroesophageal reflux disease)   . H/O hiatal hernia   . Heart murmur    asymptomatic  . Hematuria   . HOH (hard of hearing)    sightly, bilaterally  . Hyperlipidemia   . Hypertension   . Hypothyroidism   . Kidney stone on left side   . Parkinson disease [facility])     Past Surgical History:  Procedure Laterality Date  . ABDOMINAL HYSTERECTOMY     lso  . CYSTOSCOPY W/ RETROGRADES  02/28/2012   Procedure: CYSTOSCOPY WITH RETROGRADE PYELOGRAM;  Surgeon: Sebastian Ache, MD;  Location[facility];  Service: Urology;  Laterality: Right;  . CYSTOSCOPY WITH STENT PLACEMENT  02/28/2012   Procedure: CYSTOSCOPY WITH STENT PLACEMENT;  Surgeon: Sebastian Ache, MD;  Location: [facility];  Service: Urology;  Laterality:  Left;  . CYSTOSCOPY/RETROGRADE/URETEROSCOPY/STONE EXTRACTION WITH BASKET  02/28/2012   Procedure: CYSTOSCOPY/RETROGRADE/URETEROSCOPY/STONE EXTRACTION WITH BASKET;  Surgeon: Sebastian Ache, MD;  Location: [facility];  Service: Urology;  Laterality: Left;  . HOLMIUM LASER APPLICATION  02/28/2012   Procedure: HOLMIUM LASER APPLICATION;  Surgeon: Sebastian Ache, MD;  Location: [facility];  Service: Urology;  Laterality: Left;  . KNEE ARTHROSCOPY     right  . REVERSE SHOULDER ARTHROPLASTY Left 07/22/2018   Procedure: REVERSE SHOULDER ARTHROPLASTY;  Surgeon: Bjorn Pippin, MD;  Location: [facility];  Service: Orthopedics;  Laterality: Left;  . RIGHT/LEFT HEART CATH AND CORONARY ANGIOGRAPHY N/A 07/05/2019   Procedure: RIGHT/LEFT HEART CATH AND CORONARY ANGIOGRAPHY;  Surgeon: Kathleene Hazel, MD;  Location: [facility];  Service: Cardiovascular;  Laterality: N/A;  . TONSILLECTOMY    . VEIN LIGATION      Family History  Problem Relation Age of Onset  . Heart failure Father   . Healthy Sister   . Healthy Son     Social History   Socioeconomic History  . Marital status: Widowed    Spouse name: Not on file  . Number of children: 3  . Years of education: Not on file  . Highest education level: Associate degree: occupational, Scientist, product/process development, or vocational program  Occupational History  . 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 Smoking status: Never Smoker  . Smokeless tobacco: Never Used  Substance and Sexual Activity  . Alcohol use: Never  . Drug use: Never  . Sexual activity: Not on file  Other Topics Concern  . Not on file  Social History Narrative  . Not on file   Social Determinants of Health   Financial Resource Strain:   . Difficulty of Paying Living Expenses:   Food Insecurity:   . Worried About Programme researcher, broadcasting/film/video in the Last Year:   . Barista in the Last Year:   Transportation Needs:   . Automotive engineer (Medical):   Marland Kitchen Lack of Transportation (Non-Medical):   Physical Activity:   . Days of Exercise per Week:   . Minutes of Exercise per Session:   Stress:   . Feeling of Stress :   Social Connections:   . Frequency of Communication with Friends and Family:   . Frequency of Social Gatherings with Friends and Family:   . Attends Religious Services:   . Active Member of Clubs or Organizations:   . Attends Banker Meetings:   Marland Kitchen Marital Status:   Intimate Partner Violence:   . Fear of Current or Ex-Partner:   . Emotionally Abused:   Marland Kitchen Physically Abused:   . Sexually Abused:     Current Outpatient Medications  Medication Sig Dispense Refill  . apixaban (ELIQUIS) 5 MG TABS tablet Take 1 tablet (5 mg total) by mouth 2 (two) times daily. 60 tablet 6  . bisoprolol-hydrochlorothiazide (ZIAC) 5-6.25 MG tablet Take 1 tablet by mouth daily.    . brimonidine (ALPHAGAN) 0.15 % ophthalmic solution Place 1 drop into the left eye daily.     . carbidopa-levodopa (SINEMET IR) 25-100 MG tablet 2 TABLETS AT 8AM/2 TABLETS AT  NOON/1 TABLET AT 4 PM/1 TABLET AT 8PM (Patient taking differently: Take 1-2 tablets by mouth See admin instructions. 2 tablets at 8am/2 tablets at noon/1 tablet at 4 pm/1 tablet at 8pm) 540 tablet 1  . cholecalciferol (VITAMIN D) 25 MCG (1000 UNIT) tablet Take 1,000 Units by mouth daily.    . Collagen-Boron-Hyaluronic Acid ([facility] JO[facility] TRIPLE ACTION PO) Take 1 tablet by mouth daily. Triple Action Joint Health    . Cyanocobalamin (VITAMIN B-12) 5000 MCG TBDP Take 5,000 mcg by mouth daily.    Marland Kitchen docusate sodium (COLACE) 100 MG capsule Take 200 mg by mouth at bedtime.    Marland Kitchen esomeprazole (NEXIUM) 20 MG capsule Take 20 mg by mouth daily at 12 noon.     Marland Kitchen levothyroxine (SYNTHROID, LEVOTHROID) 75 MCG tablet Take 75 mcg by mouth daily before breakfast.     . Magnesium 250 MG TABS Take 250 mg by mouth at bedtime.     . Multiple Vitamins-Minerals (ANTIOXIDANT PO) Take  1 tablet by mouth daily at 6 (six) AM. MitoQ Eye (MitoQ w/Lutein, Zeaxanthin, Bilberry and [employer])    . Multiple Vitamins-Minerals (PRESERVISION AREDS PO) Take 1 tablet by mouth daily.     . Omega-3 Fatty Acids (FISH OIL) 1200 MG CAPS Take 1,200 mg by mouth daily.    Bertram Gala Glycol-Propyl Glycol (SYSTANE OP) Place 1 drop into both eyes 3 (three) times daily as needed (dry eyes).     Bertram Gala Glycol-Propyl Glycol (SYSTANE) 0.4-0.3 % GEL ophthalmic gel Place 1 application into both eyes daily as needed (dry/irritated eyes.).     Current Facility-Administered Medications  Medication Dose Route Frequency Provider Last Rate Last Admin  . sodium chloride flush (NS) 0.9 % injection 3 mL  3 mL Intravenous Q12H Crenshaw, Madolyn Frieze, MD        Allergies  Allergen Reactions  . Penicillins Other (See Comments)    unknown Did it involve swelling of the face/tongue/throat, SOB, or low BP? Unknown Did it involve sudden or severe rash/hives, skin peeling, or any reaction on the inside of your mouth or nose? Unknown Did you need to seek medical attention at a hospital or doctor's office? Unknown When did it last happen?newborn allergy If all above answers are "NO", may proceed with cephalosporin use.       Review of Systems:   General:  normal appetite, decreased energy, no weight gain, no weight loss, no fever  Cardiac:  no chest pain with exertion, no chest pain at rest, +SOB with exertion, no resting SOB, no PND, + orthopnea, no palpitations, + arrhythmia, + atrial fibrillation, no LE edema, no dizzy spells, no syncope  Respiratory:  + shortness of breath, no home oxygen, no productive cough, no dry cough, no bronchitis, n0 wheezing, no hemoptysis, no asthma, no pain with inspiration or cough, + sleep apnea, no CPAP at night  GI:   no difficulty swallowing, no reflux, no frequent heartburn, + hiatal hernia, no abdominal pain, no constipation, no diarrhea, no  hematochezia, no hematemesis, no melena  GU:   no dysuria,  no frequency, no urinary tract infection, no hematuria, no  kidney stones, no kidney disease  Vascular:  no pain suggestive of claudication, no pain in feet, no leg cramps, + varicose veins, no DVT, no non-healing foot ulcer  Neuro:   no stroke, no TIA's, no seizures, no headaches, no temporary blindness one eye,  no slurred speech, no peripheral neuropathy, no chronic pain, + instability of  gait, no memory/cognitive dysfunction  Musculoskeletal: + arthritis, no joint swelling, no myalgias, + difficulty walking, limited mobility   Skin:   no rash, no itching, no skin infections, no pressure sores or ulcerations  Psych:   no anxiety, no depression, no nervousness, no unusual recent stress  Eyes:   + blurry vision, no floaters, + recent vision changes, + wears glasses or contacts  ENT:   no hearing loss, no loose or painful teeth, no dentures, last saw dentist 11/2018  Hematologic:  no easy bruising, no abnormal bleeding, no clotting disorder, no frequent epistaxis  Endocrine:  no diabetes, does not check CBG's at home           Physical Exam:   BP 126/86 (BP Location: Left Arm, Patient Position: Sitting, Cuff Size: Large)   Pulse 87   Resp (!) 22   Ht 5\' 4"  (1.626 m)   Wt 270 lb 3.2 oz (122.6 kg)   SpO2 94% Comment: RA  BMI 46.38 kg/m   General:  Obese,  well-appearing  HEENT:  Unremarkable   Neck:   no JVD, no bruits, no adenopathy   Chest:   clear to auscultation, symmetrical breath sounds, no wheezes, no rhonchi   CV:   RRR, grade III/VI crescendo/decrescendo murmur heard best at RSB,  no diastolic murmur  Abdomen:  soft, non-tender, no masses   Extremities:  warm, well-perfused, pulses not palpable, no LE edema  Rectal/GU  Deferred  Neuro:   Grossly non-focal and symmetrical throughout  Skin:   Clean and dry, no rashes, no breakdown   Diagnostic Tests:  ECHOCARDIOGRAM REPORT       Patient Name:  Cindy Richmond Date of Exam: 05/11/2019  Medical Rec #: 05/13/2019   Height:    65.0 in  Accession #:  [id number]  Weight:    284.0 lb  Date of Birth: [birth year]   BSA:     2.296 m  Patient Age:  69 years   BP:      108/67 mmHg  Patient Gender: F       HR:      74 bpm.  Exam Location: [place]   Procedure: Cardiac Doppler and Color Doppler   Indications:  Aortic Stenosis    History:    Patient has prior history of Echocardiogram examinations,  most         recent 07/16/2018. Aortic Valve Disease,  Signs/Symptoms:Dyspnea         and Murmur; Risk Factors:Hypertension and Dyslipidemia.    Sonographer:  07/18/2018 RDCS (AE)  Referring Phys: [id number] BRIAN S CRENSHAW     Sonographer Comments: Patient is morbidly obese.  IMPRESSIONS    1. Left ventricular ejection fraction, by estimation, is 60 to 65%. The  left ventricle has normal function. The left ventricle has no regional  wall motion abnormalities. There is moderate left ventricular hypertrophy.  Left ventricular diastolic function  could not be evaluated.  2. The mitral valve is normal in structure. No evidence of mitral valve  regurgitation. No evidence of mitral stenosis.  3. Aortic valve regurgitation is not visualized. Severe aortic valve  stenosis.  4. The inferior vena cava is normal in size with greater than 50%  respiratory variability, suggesting right atrial pressure of 3 mmHg.   FINDINGS  Left Ventricle: Left ventricular ejection fraction, by estimation, is 60  to 65%. The left ventricle has normal function. The left ventricle has no  regional wall motion abnormalities. The left ventricular  internal cavity  size was normal in size. There is  moderate left ventricular hypertrophy. Left ventricular diastolic  function could not be evaluated.   Right Ventricle: The right ventricular size is normal. No increase in  right ventricular wall  thickness. Right ventricular systolic function is  normal. There is normal pulmonary artery systolic pressure. The tricuspid  regurgitant velocity is 1.59 m/s, and  with an assumed right atrial pressure of 8 mmHg, the estimated right  ventricular systolic pressure is 18.1 mmHg.   Left Atrium: Left atrial size was normal in size.   Right Atrium: Right atrial size was normal in size.   Pericardium: There is no evidence of pericardial effusion.   Mitral Valve: The mitral valve is normal in structure. Normal mobility of  the mitral valve leaflets. No evidence of mitral valve regurgitation. No  evidence of mitral valve stenosis.   Tricuspid Valve: The tricuspid valve is normal in structure. Tricuspid  valve regurgitation is not demonstrated. No evidence of tricuspid  stenosis.   Aortic Valve: The aortic valve is abnormal. Aortic valve regurgitation is  not visualized. Severe aortic stenosis is present. Aortic valve mean  gradient measures 42.6 mmHg. Aortic valve peak gradient measures 64.9  mmHg. Aortic valve area, by VTI measures  0.43 cm.   Pulmonic Valve: The pulmonic valve was normal in structure. Pulmonic valve  regurgitation is not visualized. No evidence of pulmonic stenosis.   Aorta: The aortic root is normal in size and structure.   Venous: The inferior vena cava is normal in size with greater than 50%  respiratory variability, suggesting right atrial pressure of 3 mmHg.   IAS/Shunts: No atrial level shunt detected by color flow Doppler.     LEFT VENTRICLE  PLAX 2D  LVIDd:     4.19 cm  LVIDs:     2.90 cm  LV PW:     1.52 cm  LV IVS:    2.12 cm  LVOT diam:   1.80 cm  LV SV:     36  LV SV Index:  16  LVOT Area:   2.54 cm     RIGHT VENTRICLE       IVC  RV Basal diam: 2.82 cm   IVC diam: 1.52 cm  RV S prime:   11.60 cm/s  TAPSE (M-mode): 2.1 cm   LEFT ATRIUM       Index    RIGHT ATRIUM      Index  LA diam:     4.10 cm 1.79 cm/m RA Area:   13.70 cm  LA Vol (A2C):  47.4 ml 20.64 ml/m RA Volume:  33.80 ml 14.72 ml/m  LA Vol (A4C):  60.6 ml 26.39 ml/m  LA Biplane Vol: 54.6 ml 23.78 ml/m  AORTIC VALVE  AV Area (Vmax):  0.46 cm  AV Area (Vmean):  0.42 cm  AV Area (VTI):   0.43 cm  AV Vmax:      402.80 cm/s  AV Vmean:     310.200 cm/s  AV VTI:      0.845 m  AV Peak Grad:   64.9 mmHg  AV Mean Grad:   42.6 mmHg  LVOT Vmax:     72.08 cm/s  LVOT Vmean:    50.675 cm/s  LVOT VTI:     0.143 m  LVOT/AV VTI ratio: 0.17    AORTA  Ao Root diam: 2.90 cm  Ao Asc diam: 3.50 cm   TRICUSPID VALVE  TR Peak grad:  10.1 mmHg  TR Vmax:    159.00 cm/s    SHUNTS  Systemic VTI: 0.14 m  Systemic Diam: 1.80 cm   Belva Crome MD  Electronically signed by Belva Crome MD  Signature Date/Time: 05/12/2019/12:00:09 PM      RIGHT/LEFT HEART CATH AND CORONARY ANGIOGRAPHY  Conclusion  1. No angiographic evidence of CAD 2. Severe aortic stenosis. Cath data: mean gradient 28.2mmHg, peak to peak gradient 54 mmHg, AVA 0.9 cm2. Echo data with AVA 0.4 cm2 with DI less than .20.  Unable to obtain wedge pressure.   Recommendations: Will continue workup for TAVR. Of note, she is in atrial fibrillation today. Reviewed with Dr. Jens Som as this is new for her. Will start Eliquis tomorrow.   Recommendations  Antiplatelet/Anticoag Will continue workup for TAVR.  Indications  Severe aortic stenosis [I35.0 (ICD-10-CM)]  Procedural Details  Technical Details Indication: 69 yo female with severe aortic stenosis.   Procedure: The risks, benefits, complications, treatment options, and expected outcomes were discussed with the patient. The patient and/or family concurred with the proposed plan, giving informed consent. The patient was brought to the cath lab after IV hydration was given. The patient was sedated with Versed and Fentanyl. The IV catheter  present in the right antecubital vein was prepped, draped and changed for a 18 French sheath. Right heart cath performed with a balloon tipped catheter. The right wrist was prepped and draped in a sterile fashion. 1% lidocaine was used for local anesthesia. Using the modified Seldinger access technique, a 5 French sheath was placed in the right radial artery. 3 mg Verapamil was given through the sheath. 6000 units IV heparin was given. Standard diagnostic catheters were used to perform selective coronary angiography. I crossed the aortic valve with an AL-1 and a straight wire. No LV gram performed. The sheath was removed from the right radial artery and a Terumo hemostasis band was applied at the arteriotomy site on the right wrist.    Estimated blood loss <50 mL.   During this procedure medications were administered to achieve and maintain moderate conscious sedation while the patient's heart rate, blood pressure, and oxygen saturation were continuously monitored and I was present face-to-face 100% of this time.  Medications (Filter: Administrations occurring from 07/05/19 0926 to 07/05/19 1002) (important)  Continuous medications are totaled by the amount administered until 07/05/19 1002.  midazolam (VERSED) injection (mg) Total dose:  1 mg Date/Time  Rate/Dose/Volume Action  07/05/19 0929  1 mg Given    fentaNYL (SUBLIMAZE) injection (mcg) Total dose:  25 mcg Date/Time  Rate/Dose/Volume Action  07/05/19 0929  25 mcg Given    lidocaine (PF) (XYLOCAINE) 1 % injection (mL) Total volume:  4 mL Date/Time  Rate/Dose/Volume Action  07/05/19 0936  2 mL Given  0937  2 mL Given    Radial Cocktail/Verapamil only (mL) Total volume:  10 mL Date/Time  Rate/Dose/Volume Action  07/05/19 0937  10 mL Given    heparin sodium (porcine) injection (Units) Total dose:  6,000 Units Date/Time  Rate/Dose/Volume Action  07/05/19 0947  6,000 Units Given    Heparin (Porcine) in NaCl 1000-0.9 UT/500ML-% SOLN  (mL) Total volume:  1,000 mL Date/Time  Rate/Dose/Volume Action  07/05/19 0947  500 mL Given  0947  500 mL Given    iohexol (OMNIPAQUE) 350 MG/ML injection (mL) Total volume:  50 mL Date/Time  Rate/Dose/Volume Action  07/05/19 1000  50 mL Given    Sedation Time  Sedation Time Physician-1: 29 minutes 1 second  Contrast  Medication Name Total Dose  iohexol (OMNIPAQUE) 350 MG/ML injection 50 mL    Radiation/Fluoro  Fluoro time: 7.6 (min) DAP: 22102 (mGycm2) Cumulative Air Kerma: 086 (mGy)  Complications  Complications documented before study signed (07/05/2019 10:18 AM)   RIGHT/LEFT HEART CATH AND CORONARY ANGIOGRAPHY  None Documented by Burnell Blanks, MD 07/05/2019 10:08 AM  Date Found: 07/05/2019  Time Range: Intraprocedure      Coronary Findings  Diagnostic Dominance: Right Left Anterior Descending  Vessel is large.  Left Circumflex  Vessel is large.  Right Coronary Artery  Vessel is large.  Intervention  No interventions have been documented. Coronary Diagrams  Diagnostic Dominance: Right  Intervention  Implants   No implant documentation for this case.  Syngo Images  Show images for CARDIAC CATHETERIZATION  Images on Long Term Storage  Show images for [place], [place]   Link to Procedure Log  Procedure Log    Hemo Data   Most Recent Value  Fick Cardiac Output 5.14 L/min  Fick Cardiac Output Index 2.28 (L/min)/BSA  Aortic Mean Gradient 28.63 mmHg  Aortic Peak Gradient 54 mmHg  Aortic Valve Area 0.92  Aortic Value Area Index 0.41 cm2/BSA  RA A Wave 11 mmHg  RA V Wave 11 mmHg  RA Mean 11 mmHg  RV Systolic Pressure 44 mmHg  RV Diastolic Pressure 2 mmHg  RV EDP 9 mmHg  PA Systolic Pressure 38 mmHg  PA Diastolic Pressure 23 mmHg  PA Mean 30 mmHg  AO Systolic Pressure 578 mmHg  AO Diastolic Pressure 92 mmHg  AO Mean 469 mmHg  LV Systolic Pressure 629 mmHg  LV Diastolic Pressure 15 mmHg  LV EDP 19 mmHg  AOp Systolic Pressure  528 mmHg  AOp Diastolic Pressure 82 mmHg  AOp Mean Pressure 413 mmHg  LVp Systolic Pressure 244 mmHg  LVp Diastolic Pressure 12 mmHg  LVp EDP Pressure 19 mmHg  QP/QS 1  TPVR Index 13.14 HRUI  TSVR Index 48.62 HRUI  TPVR/TSVR Ratio 0.27     EKG (07/05/2019): Atrial fibrillation    Cardiac TAVR CT  TECHNIQUE: The patient was scanned on a Siemens Force 010 slice scanner. A 120 kV retrospective scan was triggered in the descending thoracic aorta at 111 HU's. Gantry rotation speed was 270 msecs and collimation was .9 mm. No beta blockade or nitro were given. The 3D data set was reconstructed in 5% intervals of the R-R cycle. Systolic and diastolic phases were analyzed on a dedicated work station using MPR, MIP and VRT modes. The patient received 80 cc of contrast.  FINDINGS: Aortic Valve: Tri leaflet calcified with restricted leaflet motion  Aorta: Normal arch vessels no aneurysm minimal atherosclerosis  Sinotubular Junction: 24 mm  Ascending Thoracic Aorta: 34 mm  Aortic Arch: 32 mm  Descending Thoracic Aorta: 23 mm  Sinus of Valsalva Measurements:  Non-coronary: 29.7 mm  Right - coronary: 28.3 mm  Left - coronary: 29.9 mm  Coronary Artery Height above Annulus:  Left Main: 10.6 mm above annulus  Right Coronary: 11.2 mm above annulus  Virtual Basal Annulus Measurements:  Maximum/Minimum Diameter: 27.7 mm x 21.2 mm  Perimeter: 80 mm2  Area: 479 mm2  Coronary Arteries: Sufficient height above annulus for deployment  Optimum Fluoroscopic Angle for Delivery: LAO 2 Caudal 2 degrees  IMPRESSION: 1. Calcified tri leaflet AV with annular area of 479 mm2 suitable for a 26 mm Sapien 3 valve  2.  Normal aortic root diameter 3.4 cm  3.  Optimum angiographic angle  for deployment LAO 2 Caudal 2 degrees  4.  Coronary arteries sufficient height above annulus for deployment  Charlton Haws   Electronically Signed   By: Charlton Haws  M.D.   On: 07/14/2019 13:27    CT ANGIOGRAPHY CHEST, ABDOMEN AND PELVIS  TECHNIQUE: Non-contrast CT of the chest was initially obtained.  Multidetector CT imaging through the chest, abdomen and pelvis was performed using the standard protocol during bolus administration of intravenous contrast. Multiplanar reconstructed images and MIPs were obtained and reviewed to evaluate the vascular anatomy.  CONTRAST:  OMNIPAQUE IOHEXOL 350 MG/ML SOLN  COMPARISON:  None.  FINDINGS: CTA CHEST FINDINGS  Cardiovascular: Mild cardiomegaly. No significant pericardial effusion/thickening. Diffuse thickening and coarse calcification of the aortic valve. Mildly atherosclerotic nonaneurysmal thoracic aorta. Normal caliber pulmonary arteries. No central pulmonary emboli.  Mediastinum/Nodes: No discrete thyroid nodules. Unremarkable esophagus. No axillary adenopathy. Mildly enlarged 1.1 cm right paratracheal node (series 4/image 26). No additional pathologically enlarged mediastinal nodes. No hilar adenopathy.  Lungs/Pleura: No pneumothorax. No pleural effusion. Moderate elevation of the right hemidiaphragm with associated passive right lung base atelectasis. No consolidative airspace disease, lung masses or significant pulmonary nodules.  Musculoskeletal: No aggressive appearing focal osseous lesions. Partially visualized left shoulder arthroplasty. Mild thoracic spondylosis.  CTA ABDOMEN AND PELVIS FINDINGS  Hepatobiliary: Normal liver size. Subcentimeter hypodense left liver lesion is too small to characterize and requires no follow-up unless the patient has risk factors for liver malignancy. No additional liver lesions. Normal gallbladder with no radiopaque cholelithiasis. No biliary ductal dilatation.  Pancreas: Normal, with no mass or duct dilation.  Spleen: Normal size spleen. Coarse granulomatous splenic calcification. No splenic masses.  Adrenals/Urinary  Tract: No discrete adrenal nodules. No hydronephrosis. Simple 2.0 cm posterior upper right renal cyst. Otherwise no contour deforming renal lesions. Normal nondistended bladder.  Stomach/Bowel: Small hiatal hernia. Otherwise normal nondistended stomach. Normal caliber small bowel with no small bowel wall thickening. Normal appendix. Mild sigmoid diverticulosis. No large bowel wall thickening or significant pericolonic fat stranding.  Vascular/Lymphatic: Atherosclerotic nonaneurysmal abdominal aorta. No pathologically enlarged lymph nodes in the abdomen or pelvis.  Reproductive: Status post hysterectomy, with no abnormal findings at the vaginal cuff. No adnexal mass.  Other: No pneumoperitoneum, ascites or focal fluid collection.  Musculoskeletal: No aggressive appearing focal osseous lesions. Marked lumbar spondylosis.  VASCULAR MEASUREMENTS PERTINENT TO TAVR:  AORTA:  Minimal Aortic Diameter-15.0 x 12.8 mm  Severity of Aortic Calcification-mild  RIGHT PELVIS:  Right Common Iliac Artery -  Minimal Diameter-10.7 x 8.6 mm  Tortuosity-mild  Calcification-mild  Right External Iliac Artery -  Minimal Diameter-6.1 x 5.5 mm  Tortuosity-moderate  Calcification-none  Right Common Femoral Artery -  Minimal Diameter-7.2 x 6.9 mm  Tortuosity-mild  Calcification-none  LEFT PELVIS:  Left Common Iliac Artery -  Minimal Diameter-9.5 x 9.0 mm  Tortuosity-mild  Calcification-mild  Left External Iliac Artery -  Minimal Diameter-6.6 x 6.2 mm  Tortuosity-moderate  Calcification-none  Left Common Femoral Artery -  Minimal Diameter-7.5 x 6.7 mm  Tortuosity-mild  Calcification-none  Review of the MIP images confirms the above findings.  IMPRESSION: 1. Vascular findings and measurements pertinent to potential TAVR procedure, as detailed. 2. Diffuse thickening and coarse calcification of the aortic valve, compatible  with the reported history of severe aortic stenosis. 3. Mild cardiomegaly. 4. Mild right paratracheal lymphadenopathy, requiring no follow-up unless the patient has a history of malignancy. This recommendation follows ACR consensus guidelines: Managing Incidental Findings on Thoracic CT: Mediastinal and Cardiovascular Findings.  A White Paper of the ACR Incidental Findings Committee. J Am Coll Radiol. 2018; 15: 4098-1191: 1087-1096. 5. Small hiatal hernia. 6. Mild sigmoid diverticulosis. 7.  Aortic Atherosclerosis (ICD10-I70.0).   Electronically Signed   By: Delbert PhenixJason A Poff M.D.   On: 07/14/2019 12:38    Impression:  Patient has stage D severe symptomatic aortic stenosis.  She describes a slow gradual decline with worsening symptoms of exertional shortness of breath and fatigue consistent with chronic diastolic congestive heart failure, New York Heart Association functional class III.  Symptoms of also progressed in the setting of recently discovered persistent atrial fibrillation.  I have personally reviewed the patient's recent transthoracic echocardiogram, diagnostic cardiac catheterization, and CT angiograms.  Echocardiogram reveals preserved left ventricular systolic function with severe aortic stenosis.  Aortic valve appears trileaflet.  There is severe thickening, calcification, and restricted leaflet mobility involving all 3 leaflets.  Peak velocity across the aortic valve measured greater than 4.0 m/s corresponding to mean transvalvular gradient estimated 42 mmHg.  Diagnostic cardiac catheterization confirmed the presence of severe aortic stenosis and revealed normal coronary artery anatomy with no significant coronary artery disease.  There is mild pulmonary hypertension.  I agree the patient needs aortic valve replacement.  She might benefit from concomitant Maze procedure.  However, risks associated with conventional surgery would be somewhat elevated because of the patient's age, obesity,  Parkinson's disease, and severe physical deconditioning with significantly limited physical mobility.  Cardiac-gated CTA of the heart reveals anatomical characteristics consistent with aortic stenosis suitable for treatment by transcatheter aortic valve replacement without any significant complicating features and CTA of the aorta and iliac vessels demonstrate what appears to be adequate pelvic vascular access to facilitate a transfemoral approach.  EKG reveals atrial fibrillation with controlled ventricular rate and no significant intraventricular conduction delay.    Plan:  The patient was counseled at length regarding treatment alternatives for management of severe symptomatic aortic stenosis. Alternative approaches such as conventional aortic valve replacement, transcatheter aortic valve replacement, and continued medical therapy without intervention were compared and contrasted at length.  The risks associated with conventional surgical aortic valve replacement were discussed in detail, as were expectations for post-operative convalescence, and why I would be reluctant to consider this patient a candidate for conventional surgery.  Issues specific to transcatheter aortic valve replacement were discussed including questions about long term valve durability, the potential for paravalvular leak, possible increased risk of need for permanent pacemaker placement, and other technical complications related to the procedure itself.  Long-term prognosis with medical therapy was discussed. This discussion was placed in the context of the patient's own specific clinical presentation and past medical history.  All of their questions have been addressed.  The patient desires to proceed with transcatheter aortic valve replacement in the near future.   We tentatively plan to proceed on August 03, 2019.  Following the decision to proceed with transcatheter aortic valve replacement, a discussion has been held regarding  what types of management strategies would be attempted intraoperatively in the event of life-threatening complications, including whether or not the patient would be considered a candidate for the use of cardiopulmonary bypass and/or conversion to open sternotomy for attempted surgical intervention.  The patient specifically requests that should a potentially life-threatening complication develop we would attempt emergency median sternotomy and/or other aggressive surgical procedures.  The patient has been advised of a variety of complications that might develop including but not limited to risks of death, stroke, paravalvular leak, aortic dissection or other major  vascular complications, aortic annulus rupture, device embolization, cardiac rupture or perforation, mitral regurgitation, acute myocardial infarction, arrhythmia, heart block or bradycardia requiring permanent pacemaker placement, congestive heart failure, respiratory failure, renal failure, pneumonia, infection, other late complications related to structural valve deterioration or migration, or other complications that might ultimately cause a temporary or permanent loss of functional independence or other long term morbidity.  The patient provides full informed consent for the procedure as described and all questions were answered.      I spent in excess of 90 minutes during the conduct of this office consultation and >50% of this time involved direct face-to-face encounter with the patient for counseling and/or coordination of their care.      Salvatore Decent. Cornelius Moras, MD 07/19/2019 11:21 AM

## 2019-07-21 ENCOUNTER — Other Ambulatory Visit: Payer: Self-pay

## 2019-07-21 DIAGNOSIS — I350 Nonrheumatic aortic (valve) stenosis: Secondary | ICD-10-CM

## 2019-07-23 ENCOUNTER — Other Ambulatory Visit: Payer: Self-pay

## 2019-07-23 DIAGNOSIS — I350 Nonrheumatic aortic (valve) stenosis: Secondary | ICD-10-CM

## 2019-07-29 NOTE — Pre-Procedure Instructions (Signed)
Cindy Richmond  07/29/2019      [id number] - [place], [place] - [address] [place] [address] Phone: [phone] Fax: [phone]    Your procedure is scheduled on 08/03/19.  Report to [facility] Admitting at [employer] A.M.  Call this number if you have problems the morning of surgery:  [phone]   Remember:  Do not eat or drink after midnight.  Take these medicines the morning of surgery with A SIP OF WATER ----CONTINUE TAKING ALL MEDS AS INSTRUCTED BY YOU DOCTOR. ELIQUIS AS INSTRUCTED ALSO    Do not wear jewelry, make-up or nail polish.  Do not wear lotions, powders, or perfumes, or deodorant.  Do not shave 48 hours prior to surgery.  Men may shave face and neck.  Do not bring valuables to the hospital.  [facility] is not responsible for any belongings or valuables.  Contacts, dentures or bridgework may not be worn into surgery.  Leave your suitcase in the car.  After surgery it may be brought to your room.  For patients admitted to the hospital, discharge time will be determined by your treatment team.  Patients discharged the day of surgery will not be allowed to drive home.    Special instructions:  Do not take any aspirin,anti-inflammatories,vitamins,or herbal supplements 5-7 days prior to surgery.[place] - Preparing for Surgery  Before surgery, you can play an important role.  Because skin is not sterile, your skin needs to be as free of germs as possible.  You can reduce the number of germs on you skin by washing with CHG (chlorahexidine gluconate) soap before surgery.  CHG is an antiseptic cleaner which kills germs and bonds with the skin to continue killing germs even after washing.  Oral Hygiene is also important in reducing the risk of infection.  Remember to brush your teeth with your regular toothpaste the morning of surgery.  Please DO NOT use if you have an allergy to CHG or antibacterial soaps.  If your skin  becomes reddened/irritated stop using the CHG and inform your nurse when you arrive at Short Stay.  Do not shave (including legs and underarms) for at least 48 hours prior to the first CHG shower.  You may shave your face.  Please follow these instructions carefully:   1.  Shower with CHG Soap the night before surgery and the morning of Surgery.  2.  If you choose to wash your hair, wash your hair first as usual with your normal shampoo.  3.  After you shampoo, rinse your hair and body thoroughly to remove the shampoo. 4.  Use CHG as you would any other liquid soap.  You can apply chg directly to the skin and wash gently with a      scrungie or washcloth.           5.  Apply the CHG Soap to your body ONLY FROM THE NECK DOWN.   Do not use on open wounds or open sores. Avoid contact with your eyes, ears, mouth and genitals (private parts).  Wash genitals (private parts) with your normal soap.  6.  Wash thoroughly, paying special attention to the area where your surgery will be performed.  7.  Thoroughly rinse your body with warm water from the neck down.  8.  DO NOT shower/wash with your normal soap after using and rinsing off the CHG Soap.  9.  Pat yourself dry with a clean towel.  10.  Wear clean pajamas.            11.  Place clean sheets on your bed the night of your first shower and do not sleep with pets.  Day of Surgery  Do not apply any lotions/deoderants the morning of surgery.   Please wear clean clothes to the hospital/surgery center. Remember to brush your teeth with toothpaste.    Please read over the following fact sheets that you were given. Coughing and Deep Breathing and Surgical Site Infection Prevention

## 2019-07-30 ENCOUNTER — Other Ambulatory Visit (HOSPITAL_COMMUNITY): Payer: Medicare PPO

## 2019-07-30 ENCOUNTER — Other Ambulatory Visit: Payer: Self-pay

## 2019-07-30 ENCOUNTER — Ambulatory Visit (HOSPITAL_COMMUNITY)
Admission: RE | Admit: 2019-07-30 | Discharge: 2019-07-30 | Disposition: A | Discharge status: Home / Self Care | Payer: Medicare PPO | Source: Ambulatory Visit | Attending: Cardiovascular Disease | Admitting: Cardiovascular Disease

## 2019-07-30 ENCOUNTER — Other Ambulatory Visit (HOSPITAL_COMMUNITY)
Admission: RE | Admit: 2019-07-30 | Discharge: 2019-07-30 | Disposition: A | Discharge status: Home / Self Care | Payer: Medicare PPO | Source: Ambulatory Visit | Attending: Cardiovascular Disease | Admitting: Cardiovascular Disease

## 2019-07-30 ENCOUNTER — Encounter (HOSPITAL_COMMUNITY): Payer: Self-pay

## 2019-07-30 ENCOUNTER — Inpatient Hospital Stay (HOSPITAL_COMMUNITY): Admission: RE | Admit: 2019-07-30 | Payer: Medicare PPO | Source: Ambulatory Visit

## 2019-07-30 ENCOUNTER — Encounter (HOSPITAL_COMMUNITY)
Admission: RE | Admit: 2019-07-30 | Discharge: 2019-07-30 | Disposition: A | Discharge status: Home / Self Care | Payer: Medicare PPO | Source: Ambulatory Visit | Attending: Cardiovascular Disease | Admitting: Cardiovascular Disease

## 2019-07-30 DIAGNOSIS — I350 Nonrheumatic aortic (valve) stenosis: Secondary | ICD-10-CM | POA: Diagnosis not present

## 2019-07-30 DIAGNOSIS — Z20822 Contact with and (suspected) exposure to covid-19: Secondary | ICD-10-CM | POA: Diagnosis not present

## 2019-07-30 DIAGNOSIS — J986 Disorders of diaphragm: Secondary | ICD-10-CM | POA: Diagnosis not present

## 2019-07-30 DIAGNOSIS — Z01812 Encounter for preprocedural laboratory examination: Secondary | ICD-10-CM | POA: Insufficient documentation

## 2019-07-30 HISTORY — DX: Sleep apnea, unspecified: G47.30

## 2019-07-30 HISTORY — DX: Personal history of urinary calculi: Z87.442

## 2019-07-30 HISTORY — DX: Cardiac arrhythmia, unspecified: I49.9

## 2019-07-30 LAB — COMPREHENSIVE METABOLIC PANEL
ALT: 7 U/L (ref 0–44)
AST: 14 U/L — ABNORMAL LOW (ref 15–41)
Albumin: 3.7 g/dL (ref 3.5–5.0)
Alkaline Phosphatase: 71 U/L (ref 38–126)
Anion gap: 10 (ref 5–15)
BUN: 15 mg/dL (ref 8–23)
CO2: 25 mmol/L (ref 22–32)
Calcium: 9.1 mg/dL (ref 8.9–10.3)
Chloride: 105 mmol/L (ref 98–111)
Creatinine, Ser: 0.87 mg/dL (ref 0.44–1.00)
GFR calc Af Amer: >60 mL/min (ref >60)
GFR calc non Af Amer: >60 mL/min (ref >60)
Glucose, Bld: 124 mg/dL — ABNORMAL HIGH (ref 70–99)
Potassium: 3.8 mmol/L (ref 3.5–5.1)
Sodium: 140 mmol/L (ref 135–145)
Total Bilirubin: 0.8 mg/dL (ref 0.3–1.2)
Total Protein: 6.3 g/dL — ABNORMAL LOW (ref 6.5–8.1)

## 2019-07-30 LAB — URINALYSIS, ROUTINE W REFLEX MICROSCOPIC
Bilirubin Urine: NEGATIVE
Glucose, UA: NEGATIVE mg/dL
Ketones, ur: 5 mg/dL — AB
Nitrite: NEGATIVE
Protein, ur: NEGATIVE mg/dL
Specific Gravity, Urine: 1.019 (ref 1.005–1.030)
pH: 6 (ref 5.0–8.0)

## 2019-07-30 LAB — BRAIN NATRIURETIC PEPTIDE: B Natriuretic Peptide: 209.1 pg/mL — ABNORMAL HIGH (ref 0.0–100.0)

## 2019-07-30 LAB — ABO/RH: ABO/RH(D): A POS

## 2019-07-30 LAB — HEMOGLOBIN A1C
Hgb A1c MFr Bld: 5.7 % — ABNORMAL HIGH (ref 4.8–5.6)
Mean Plasma Glucose: 116.89 mg/dL

## 2019-07-30 LAB — TYPE AND SCREEN
ABO/RH(D): A POS
Antibody Screen: NEGATIVE

## 2019-07-30 LAB — CBC
HCT: 47.3 % — ABNORMAL HIGH (ref 36.0–46.0)
Hemoglobin: 14.9 g/dL (ref 12.0–15.0)
MCH: 28.5 pg (ref 26.0–34.0)
MCHC: 31.5 g/dL (ref 30.0–36.0)
MCV: 90.6 fL (ref 80.0–100.0)
Platelets: 145 10*3/uL — ABNORMAL LOW (ref 150–400)
RBC: 5.22 MIL/uL — ABNORMAL HIGH (ref 3.87–5.11)
RDW: 15.6 % — ABNORMAL HIGH (ref 11.5–15.5)
WBC: 8.1 10*3/uL (ref 4.0–10.5)
nRBC: 0 % (ref 0.0–0.2)

## 2019-07-30 LAB — SURGICAL PCR SCREEN
MRSA, PCR: NEGATIVE
Staphylococcus aureus: NEGATIVE

## 2019-07-30 LAB — PROTIME-INR
INR: 1.1 (ref 0.8–1.2)
Prothrombin Time: 14 seconds (ref 11.4–15.2)

## 2019-07-30 LAB — APTT: aPTT: 32 seconds (ref 24–36)

## 2019-07-30 NOTE — Progress Notes (Signed)
Message sent to Dr. Clifton James regarding abnormal labs.

## 2019-07-30 NOTE — Progress Notes (Signed)
PCP:  Mila Palmer, MD Cardiologist:  [name], MD  EKG:  07/05/19 CXR:  07/30/19 ECHO:  3/21 Stress Test:  Denies Cardiac Cath:  5/21  Covid test 07/30/19  Anesthesia Review:  Yes, cardiac history.  Eliquis last dose 07/26/19.  Patient denies shortness of breath, fever, cough, and chest pain at PAT appointment.  Patient verbalized understanding of instructions provided today at the PAT appointment.  Patient asked to review instructions at home and day of surgery.

## 2019-07-30 NOTE — Progress Notes (Signed)
[id number] [place], [place] - [address] [place] [address] Phone: [phone] Fax: [phone]      Your procedure is scheduled on August 03, 2019.  Report to [facility] Main Entrance "A" at 11:30 A.M., and check in at the Admitting office.  Call this number if you have problems the morning of surgery:  [phone]  Call [phone] if you have any questions prior to your surgery date Monday-Friday 8am-4pm    Remember:  Do not eat or drink after midnight the night before your surgery     Take these medicines the morning of surgery with A SIP OF WATER: brimonidine (ALPHAGAN) carbidopa-levodopa (SINEMET IR) levothyroxine (SYNTHROID, LEVOTHROID)  As of today, STOP taking any Aspirin (unless otherwise instructed by your surgeon) and Aspirin containing products, Aleve, Naproxen, Ibuprofen, Motrin, Advil, Goody's, BC's, all herbal medications, fish oil, and all vitamins.  Follow your surgeon's instructions on when to stop Eliquis.  If no instructions were given by your surgeon then you will need to call the office to get those instructions.                        Do not wear jewelry, make up, or nail polish            Do not wear lotions, powders, perfumes, or deodorant.            Do not shave 48 hours prior to surgery.              Do not bring valuables to the hospital.            [facility] is not responsible for any belongings or valuables.  Do NOT Smoke (Tobacco/Vapping) or drink Alcohol 24 hours prior to your procedure If you use a CPAP at night, you may bring all equipment for your overnight stay.   Contacts, glasses, dentures or bridgework may not be worn into surgery.      For patients admitted to the hospital, discharge time will be determined by your treatment team.   Patients discharged the day of surgery will not be allowed to drive home, and someone needs to stay with them for 24 hours.    Special instructions:    [place]- Preparing For Surgery  Before surgery, you can play an important role. Because skin is not sterile, your skin needs to be as free of germs as possible. You can reduce the number of germs on your skin by washing with CHG (chlorahexidine gluconate) Soap before surgery.  CHG is an antiseptic cleaner which kills germs and bonds with the skin to continue killing germs even after washing.    Oral Hygiene is also important to reduce your risk of infection.  Remember - BRUSH YOUR TEETH THE MORNING OF SURGERY WITH YOUR REGULAR TOOTHPASTE  Please do not use if you have an allergy to CHG or antibacterial soaps. If your skin becomes reddened/irritated stop using the CHG.  Do not shave (including legs and underarms) for at least 48 hours prior to first CHG shower. It is OK to shave your face.  Please follow these instructions carefully.   1. Shower the NIGHT BEFORE SURGERY and the MORNING OF SURGERY with CHG Soap.   2. If you chose to wash your hair, wash your hair first as usual with your normal shampoo.  3. After you shampoo, rinse your hair and body thoroughly to remove the shampoo.  4. Use CHG as  you would any other liquid soap. You can apply CHG directly to the skin and wash gently with a scrungie or a clean washcloth.   5. Apply the CHG Soap to your body ONLY FROM THE NECK DOWN.  Do not use on open wounds or open sores. Avoid contact with your eyes, ears, mouth and genitals (private parts). Wash Face and genitals (private parts)  with your normal soap.   6. Wash thoroughly, paying special attention to the area where your surgery will be performed.  7. Thoroughly rinse your body with warm water from the neck down.  8. DO NOT shower/wash with your normal soap after using and rinsing off the CHG Soap.  9. Pat yourself dry with a CLEAN TOWEL.  10. Wear CLEAN PAJAMAS to bed the night before surgery, wear comfortable clothes the morning of surgery  11. Place CLEAN SHEETS on your bed  the night of your first shower and DO NOT SLEEP WITH PETS.   Day of Surgery:   Do not apply any deodorants/lotions.  Please wear clean clothes to the hospital/surgery center.   Remember to brush your teeth WITH YOUR REGULAR TOOTHPASTE.   Please read over the following fact sheets that you were given.

## 2019-07-31 LAB — SARS CORONAVIRUS 2 (TAT 6-24 HRS): SARS Coronavirus 2: NEGATIVE

## 2019-08-02 MED ORDER — LEVOFLOXACIN IN D5W 500 MG/100ML IV SOLN
500.0000 mg | INTRAVENOUS | Status: AC
  Administered 2019-08-03: 500 mg via INTRAVENOUS
  Filled 2019-08-02 (×2): qty 100

## 2019-08-02 MED ORDER — MAGNESIUM SULFATE 50 % IJ SOLN
40.0000 meq | INTRAMUSCULAR | Status: DC
  Filled 2019-08-02: qty 9.85

## 2019-08-02 MED ORDER — SODIUM CHLORIDE 0.9 % IV SOLN
INTRAVENOUS | Status: DC
  Filled 2019-08-02: qty 30

## 2019-08-02 MED ORDER — POTASSIUM CHLORIDE 2 MEQ/ML IV SOLN
80.0000 meq | INTRAVENOUS | Status: DC
  Filled 2019-08-02: qty 40

## 2019-08-02 MED ORDER — VANCOMYCIN HCL 1500 MG/300ML IV SOLN
1500.0000 mg | INTRAVENOUS | Status: AC
  Administered 2019-08-03: 1500 mg via INTRAVENOUS
  Filled 2019-08-02 (×2): qty 300

## 2019-08-02 MED ORDER — DEXMEDETOMIDINE HCL IN NACL 400 MCG/100ML IV SOLN
0.1000 ug/kg/h | INTRAVENOUS | Status: AC
  Administered 2019-08-03: 1 ug/kg/h via INTRAVENOUS
  Administered 2019-08-03: 122.1 ug via INTRAVENOUS
  Filled 2019-08-02 (×3): qty 100

## 2019-08-02 MED ORDER — NOREPINEPHRINE 4 MG/250ML-% IV SOLN
0.0000 ug/min | INTRAVENOUS | Status: DC
  Filled 2019-08-02: qty 250

## 2019-08-02 NOTE — Anesthesia Preprocedure Evaluation (Addendum)
Anesthesia Evaluation  Patient identified by MRN, date of birth, ID band Patient awake    Reviewed: Allergy & Precautions, H&P , NPO status , Patient's Chart, lab work & pertinent test results  Airway Mallampati: III  TM Distance: >3 FB Neck ROM: Full    Dental no notable dental hx. (+) Teeth Intact, Dental Advisory Given   Pulmonary sleep apnea ,    Pulmonary exam normal breath sounds clear to auscultation       Cardiovascular hypertension, + dysrhythmias Atrial Fibrillation + Valvular Problems/Murmurs AS  Rhythm:Irregular Rate:Normal     Neuro/Psych Parkinson's disease negative neurological ROS  negative psych ROS   GI/Hepatic Neg liver ROS, hiatal hernia, GERD  Controlled,  Endo/Other  Hypothyroidism Morbid obesity  Renal/GU negative Renal ROS  negative genitourinary   Musculoskeletal   Abdominal   Peds  Hematology negative hematology ROS (+)   Anesthesia Other Findings   Reproductive/Obstetrics negative OB ROS                           Anesthesia Physical Anesthesia Plan  ASA: IV  Anesthesia Plan: MAC   Post-op Pain Management:    Induction: Intravenous  PONV Risk Score and Plan: 2 and Propofol infusion and Ondansetron  Airway Management Planned: Simple Face Mask  Additional Equipment: Arterial line  Intra-op Plan:   Post-operative Plan:   Informed Consent: I have reviewed the patients History and Physical, chart, labs and discussed the procedure including the risks, benefits and alternatives for the proposed anesthesia with the patient or authorized representative who has indicated his/her understanding and acceptance.     Dental advisory given  Plan Discussed with: CRNA  Anesthesia Plan Comments: (Elevated right hemidiaphragm noted on preop imaging.  CHEST - 2 VIEW 07/30/19: COMPARISON:  04/22/2016  FINDINGS: The heart size and mediastinal contours are within  normal limits. Increased elevation of right hemidiaphragm is noted, with atelectasis or scarring in the posterior right lower lobe. No evidence of pulmonary consolidation or edema. No evidence of pleural effusion. Left shoulder prosthesis noted.  IMPRESSION: Increased elevation of right hemidiaphragm, with atelectasis versus scarring in posterior right lower lobe.)       Anesthesia Quick Evaluation

## 2019-08-03 ENCOUNTER — Other Ambulatory Visit: Payer: Self-pay | Admitting: Physician Assistant

## 2019-08-03 ENCOUNTER — Other Ambulatory Visit (HOSPITAL_COMMUNITY): Payer: Self-pay | Admitting: *Deleted

## 2019-08-03 ENCOUNTER — Inpatient Hospital Stay (HOSPITAL_COMMUNITY): Payer: Medicare PPO

## 2019-08-03 ENCOUNTER — Inpatient Hospital Stay (HOSPITAL_COMMUNITY): Payer: Medicare PPO | Admitting: Physician Assistant

## 2019-08-03 ENCOUNTER — Other Ambulatory Visit: Payer: Self-pay

## 2019-08-03 ENCOUNTER — Encounter (HOSPITAL_COMMUNITY)
Admission: RE | Disposition: A | Discharge status: Home / Self Care | Payer: Self-pay | Source: Home / Self Care | Attending: Thoracic Surgery (Cardiothoracic Vascular Surgery)

## 2019-08-03 ENCOUNTER — Inpatient Hospital Stay (HOSPITAL_COMMUNITY)
Admission: RE | Admit: 2019-08-03 | Discharge: 2019-08-04 | DRG: 266 | Disposition: A | Discharge status: Home / Self Care | Payer: Medicare PPO | Attending: Thoracic Surgery (Cardiothoracic Vascular Surgery) | Admitting: Thoracic Surgery (Cardiothoracic Vascular Surgery)

## 2019-08-03 ENCOUNTER — Ambulatory Visit (HOSPITAL_COMMUNITY): Payer: Medicare PPO

## 2019-08-03 ENCOUNTER — Encounter (HOSPITAL_COMMUNITY): Payer: Self-pay | Admitting: Cardiovascular Disease

## 2019-08-03 DIAGNOSIS — I350 Nonrheumatic aortic (valve) stenosis: Secondary | ICD-10-CM | POA: Diagnosis not present

## 2019-08-03 DIAGNOSIS — K449 Diaphragmatic hernia without obstruction or gangrene: Secondary | ICD-10-CM | POA: Diagnosis present

## 2019-08-03 DIAGNOSIS — I700 Atherosclerosis of aorta: Secondary | ICD-10-CM | POA: Diagnosis present

## 2019-08-03 DIAGNOSIS — Z006 Encounter for examination for normal comparison and control in clinical research program: Secondary | ICD-10-CM

## 2019-08-03 DIAGNOSIS — K5730 Diverticulosis of large intestine without perforation or abscess without bleeding: Secondary | ICD-10-CM | POA: Diagnosis present

## 2019-08-03 DIAGNOSIS — Z8249 Family history of ischemic heart disease and other diseases of the circulatory system: Secondary | ICD-10-CM | POA: Diagnosis not present

## 2019-08-03 DIAGNOSIS — Z952 Presence of prosthetic heart valve: Secondary | ICD-10-CM

## 2019-08-03 DIAGNOSIS — K219 Gastro-esophageal reflux disease without esophagitis: Secondary | ICD-10-CM | POA: Diagnosis present

## 2019-08-03 DIAGNOSIS — E039 Hypothyroidism, unspecified: Secondary | ICD-10-CM | POA: Diagnosis not present

## 2019-08-03 DIAGNOSIS — Z79890 Hormone replacement therapy (postmenopausal): Secondary | ICD-10-CM | POA: Diagnosis not present

## 2019-08-03 DIAGNOSIS — Z96612 Presence of left artificial shoulder joint: Secondary | ICD-10-CM | POA: Diagnosis present

## 2019-08-03 DIAGNOSIS — I4819 Other persistent atrial fibrillation: Secondary | ICD-10-CM | POA: Diagnosis not present

## 2019-08-03 DIAGNOSIS — Z7901 Long term (current) use of anticoagulants: Secondary | ICD-10-CM

## 2019-08-03 DIAGNOSIS — Z880 Allergy status to penicillin: Secondary | ICD-10-CM

## 2019-08-03 DIAGNOSIS — Z6841 Body Mass Index (BMI) 40.0 and over, adult: Secondary | ICD-10-CM | POA: Diagnosis not present

## 2019-08-03 DIAGNOSIS — I10 Essential (primary) hypertension: Secondary | ICD-10-CM | POA: Diagnosis not present

## 2019-08-03 DIAGNOSIS — G4733 Obstructive sleep apnea (adult) (pediatric): Secondary | ICD-10-CM | POA: Diagnosis not present

## 2019-08-03 DIAGNOSIS — G20A1 Parkinson's disease without dyskinesia, without mention of fluctuations: Secondary | ICD-10-CM | POA: Diagnosis present

## 2019-08-03 DIAGNOSIS — G20 Parkinson's disease: Secondary | ICD-10-CM | POA: Diagnosis present

## 2019-08-03 DIAGNOSIS — H9190 Unspecified hearing loss, unspecified ear: Secondary | ICD-10-CM | POA: Diagnosis present

## 2019-08-03 DIAGNOSIS — E785 Hyperlipidemia, unspecified: Secondary | ICD-10-CM | POA: Diagnosis present

## 2019-08-03 DIAGNOSIS — I5033 Acute on chronic diastolic (congestive) heart failure: Secondary | ICD-10-CM

## 2019-08-03 DIAGNOSIS — I110 Hypertensive heart disease with heart failure: Secondary | ICD-10-CM | POA: Diagnosis present

## 2019-08-03 HISTORY — DX: Nonrheumatic aortic (valve) stenosis: I35.0

## 2019-08-03 HISTORY — PX: INTRAOPERATIVE TRANSTHORACIC ECHOCARDIOGRAM: SHX6523

## 2019-08-03 HISTORY — PX: TRANSCATHETER AORTIC VALVE REPLACEMENT, TRANSFEMORAL: SHX6400

## 2019-08-03 HISTORY — DX: Presence of prosthetic heart valve: Z95.2

## 2019-08-03 LAB — POCT I-STAT 7, (LYTES, BLD GAS, ICA,H+H)
Acid-Base Excess: 1 mmol/L (ref 0.0–2.0)
Bicarbonate: 26.2 mmol/L (ref 20.0–28.0)
Calcium, Ion: 1.2 mmol/L (ref 1.15–1.40)
HCT: 38 % (ref 36.0–46.0)
Hemoglobin: 12.9 g/dL (ref 12.0–15.0)
O2 Saturation: 99 %
Potassium: 4.3 mmol/L (ref 3.5–5.1)
Sodium: 140 mmol/L (ref 135–145)
TCO2: 27 mmol/L (ref 22–32)
pCO2 arterial: 41.8 mmHg (ref 32.0–48.0)
pH, Arterial: 7.405 (ref 7.350–7.450)
pO2, Arterial: 128 mmHg — ABNORMAL HIGH (ref 83.0–108.0)

## 2019-08-03 LAB — POCT I-STAT, CHEM 8
BUN: 12 mg/dL (ref 8–23)
BUN: 11 mg/dL (ref 8–23)
BUN: 12 mg/dL (ref 8–23)
Calcium, Ion: 1.22 mmol/L (ref 1.15–1.40)
Calcium, Ion: 1.25 mmol/L (ref 1.15–1.40)
Calcium, Ion: 1.21 mmol/L (ref 1.15–1.40)
Chloride: 103 mmol/L (ref 98–111)
Chloride: 104 mmol/L (ref 98–111)
Chloride: 104 mmol/L (ref 98–111)
Creatinine, Ser: 0.7 mg/dL (ref 0.44–1.00)
Creatinine, Ser: 0.7 mg/dL (ref 0.44–1.00)
Creatinine, Ser: 0.7 mg/dL (ref 0.44–1.00)
Glucose, Bld: 146 mg/dL — ABNORMAL HIGH (ref 70–99)
Glucose, Bld: 124 mg/dL — ABNORMAL HIGH (ref 70–99)
Glucose, Bld: 110 mg/dL — ABNORMAL HIGH (ref 70–99)
HCT: 39 % (ref 36.0–46.0)
HCT: 38 % (ref 36.0–46.0)
HCT: 37 % (ref 36.0–46.0)
Hemoglobin: 13.3 g/dL (ref 12.0–15.0)
Hemoglobin: 12.9 g/dL (ref 12.0–15.0)
Hemoglobin: 12.6 g/dL (ref 12.0–15.0)
Potassium: 4.3 mmol/L (ref 3.5–5.1)
Potassium: 4.2 mmol/L (ref 3.5–5.1)
Potassium: 4 mmol/L (ref 3.5–5.1)
Sodium: 139 mmol/L (ref 135–145)
Sodium: 141 mmol/L (ref 135–145)
Sodium: 141 mmol/L (ref 135–145)
TCO2: 24 mmol/L (ref 22–32)
TCO2: 25 mmol/L (ref 22–32)
TCO2: 26 mmol/L (ref 22–32)

## 2019-08-03 SURGERY — IMPLANTATION, AORTIC VALVE, TRANSCATHETER, FEMORAL APPROACH
Anesthesia: Monitor Anesthesia Care

## 2019-08-03 MED ORDER — MORPHINE SULFATE (PF) 2 MG/ML IV SOLN: 1.0000 mg | INTRAVENOUS | Status: DC | PRN

## 2019-08-03 MED ORDER — PHENYLEPHRINE HCL-NACL 20-0.9 MG/250ML-% IV SOLN
0.0000 ug/min | INTRAVENOUS | Status: DC
  Filled 2019-08-03: qty 250

## 2019-08-03 MED ORDER — ACETAMINOPHEN 325 MG PO TABS
650.0000 mg | ORAL_TABLET | Freq: Four times a day (QID) | ORAL | Status: DC | PRN
  Administered 2019-08-04: 650 mg via ORAL
  Filled 2019-08-03: qty 2

## 2019-08-03 MED ORDER — DOCUSATE SODIUM 100 MG PO CAPS: 200.0000 mg | ORAL_CAPSULE | Freq: Every day | ORAL | Status: DC | PRN

## 2019-08-03 MED ORDER — VANCOMYCIN HCL IN DEXTROSE 1-5 GM/200ML-% IV SOLN
1000.0000 mg | Freq: Once | INTRAVENOUS | Status: AC
  Administered 2019-08-04: 1000 mg via INTRAVENOUS
  Filled 2019-08-03: qty 200

## 2019-08-03 MED ORDER — LEVOFLOXACIN IN D5W 750 MG/150ML IV SOLN
750.0000 mg | INTRAVENOUS | Status: AC
  Administered 2019-08-04: 750 mg via INTRAVENOUS
  Filled 2019-08-03: qty 150

## 2019-08-03 MED ORDER — CHLORHEXIDINE GLUCONATE 4 % EX LIQD: 60.0000 mL | Freq: Once | CUTANEOUS | Status: DC

## 2019-08-03 MED ORDER — PANTOPRAZOLE SODIUM 40 MG PO TBEC
40.0000 mg | DELAYED_RELEASE_TABLET | Freq: Every day | ORAL | Status: DC
  Administered 2019-08-04: 40 mg via ORAL
  Filled 2019-08-03: qty 1

## 2019-08-03 MED ORDER — PROPOFOL 500 MG/50ML IV EMUL
INTRAVENOUS | Status: DC | PRN
Start: 2019-08-03 — End: 2019-08-03
  Administered 2019-08-03: 25 ug/kg/min via INTRAVENOUS

## 2019-08-03 MED ORDER — TRAMADOL HCL 50 MG PO TABS: 50.0000 mg | ORAL_TABLET | ORAL | Status: DC | PRN

## 2019-08-03 MED ORDER — SODIUM CHLORIDE 0.9% FLUSH
3.0000 mL | Freq: Two times a day (BID) | INTRAVENOUS | Status: DC
  Administered 2019-08-04 (×2): 3 mL via INTRAVENOUS

## 2019-08-03 MED ORDER — IODIXANOL 320 MG/ML IV SOLN
INTRAVENOUS | Status: DC | PRN
  Administered 2019-08-03: 39.1 mL

## 2019-08-03 MED ORDER — NITROGLYCERIN IN D5W 200-5 MCG/ML-% IV SOLN: 0.0000 ug/min | INTRAVENOUS | Status: DC

## 2019-08-03 MED ORDER — CARBIDOPA-LEVODOPA 25-250 MG PO TABS
2.0000 | ORAL_TABLET | Freq: Every day | ORAL | Status: DC
  Administered 2019-08-04: 2 via ORAL
  Filled 2019-08-03 (×2): qty 2

## 2019-08-03 MED ORDER — CHLORHEXIDINE GLUCONATE 4 % EX LIQD: 30.0000 mL | CUTANEOUS | Status: DC

## 2019-08-03 MED ORDER — SODIUM CHLORIDE 0.9 % IV SOLN
INTRAVENOUS | Status: DC | PRN
  Administered 2019-08-03: 1500 mL

## 2019-08-03 MED ORDER — PROPOFOL 10 MG/ML IV BOLUS
INTRAVENOUS | Status: DC | PRN
  Administered 2019-08-03 (×2): 20 mg via INTRAVENOUS

## 2019-08-03 MED ORDER — LEVOTHYROXINE SODIUM 75 MCG PO TABS
75.0000 ug | ORAL_TABLET | Freq: Every day | ORAL | Status: DC
  Administered 2019-08-04: 75 ug via ORAL
  Filled 2019-08-03: qty 1

## 2019-08-03 MED ORDER — ACETAMINOPHEN 500 MG PO TABS
1000.0000 mg | ORAL_TABLET | Freq: Once | ORAL | Status: AC
  Administered 2019-08-03: 1000 mg via ORAL
  Filled 2019-08-03: qty 2

## 2019-08-03 MED ORDER — LIDOCAINE HCL (PF) 1 % IJ SOLN
INTRAMUSCULAR | Status: AC
  Filled 2019-08-03: qty 30

## 2019-08-03 MED ORDER — CHLORHEXIDINE GLUCONATE 0.12 % MT SOLN
15.0000 mL | Freq: Once | OROMUCOSAL | Status: AC
  Administered 2019-08-03: 15 mL via OROMUCOSAL
  Filled 2019-08-03: qty 15

## 2019-08-03 MED ORDER — ACETAMINOPHEN 650 MG RE SUPP: 650.0000 mg | Freq: Four times a day (QID) | RECTAL | Status: DC | PRN

## 2019-08-03 MED ORDER — PROPOFOL 10 MG/ML IV BOLUS
INTRAVENOUS | Status: AC
  Filled 2019-08-03: qty 20

## 2019-08-03 MED ORDER — SODIUM CHLORIDE 0.9 % IV SOLN: 250.0000 mL | INTRAVENOUS | Status: DC | PRN

## 2019-08-03 MED ORDER — SODIUM CHLORIDE 0.9 % IV SOLN: INTRAVENOUS | Status: DC

## 2019-08-03 MED ORDER — CARBIDOPA-LEVODOPA 25-250 MG PO TABS
2.0000 | ORAL_TABLET | Freq: Every day | ORAL | Status: DC
  Administered 2019-08-04: 2 via ORAL
  Filled 2019-08-03: qty 2

## 2019-08-03 MED ORDER — SODIUM CHLORIDE 0.9% FLUSH: 3.0000 mL | INTRAVENOUS | Status: DC | PRN

## 2019-08-03 MED ORDER — OXYCODONE HCL 5 MG PO TABS: 5.0000 mg | ORAL_TABLET | ORAL | Status: DC | PRN

## 2019-08-03 MED ORDER — CARBIDOPA-LEVODOPA 25-100 MG PO TABS: 1.0000 | ORAL_TABLET | ORAL | Status: DC

## 2019-08-03 MED ORDER — HEPARIN SODIUM (PORCINE) 1000 UNIT/ML IJ SOLN
INTRAMUSCULAR | Status: DC | PRN
  Administered 2019-08-03: 19000 [IU] via INTRAVENOUS

## 2019-08-03 MED ORDER — CARBIDOPA-LEVODOPA 25-250 MG PO TABS
1.0000 | ORAL_TABLET | Freq: Every day | ORAL | Status: DC
  Administered 2019-08-03: 1 via ORAL
  Filled 2019-08-03 (×2): qty 1

## 2019-08-03 MED ORDER — SODIUM CHLORIDE 0.9 % IV SOLN
INTRAVENOUS | Status: AC
  Filled 2019-08-03 (×3): qty 1.2

## 2019-08-03 MED ORDER — CARBIDOPA-LEVODOPA 25-250 MG PO TABS
1.0000 | ORAL_TABLET | Freq: Every day | ORAL | Status: DC
  Filled 2019-08-03: qty 1

## 2019-08-03 MED ORDER — CLEVIDIPINE BUTYRATE 0.5 MG/ML IV EMUL
0.0000 mg/h | INTRAVENOUS | Status: AC
  Administered 2019-08-03: 2 mg/h via INTRAVENOUS
  Filled 2019-08-03: qty 100

## 2019-08-03 MED ORDER — LIDOCAINE HCL 1 % IJ SOLN
INTRAMUSCULAR | Status: DC | PRN
  Administered 2019-08-03: 10 mL

## 2019-08-03 MED ORDER — LACTATED RINGERS IV SOLN: INTRAVENOUS | Status: DC | PRN

## 2019-08-03 MED ORDER — ONDANSETRON HCL 4 MG/2ML IJ SOLN: 4.0000 mg | Freq: Four times a day (QID) | INTRAMUSCULAR | Status: DC | PRN

## 2019-08-03 MED ORDER — PROTAMINE SULFATE 10 MG/ML IV SOLN
INTRAVENOUS | Status: DC | PRN
Start: 2019-08-03 — End: 2019-08-03
  Administered 2019-08-03: 190 mg via INTRAVENOUS

## 2019-08-03 MED ORDER — SODIUM CHLORIDE 0.9 % IV SOLN
INTRAVENOUS | Status: DC
  Administered 2019-08-03: 50 mL/h via INTRAVENOUS

## 2019-08-03 MED ORDER — ASPIRIN 81 MG PO CHEW
81.0000 mg | CHEWABLE_TABLET | Freq: Every day | ORAL | Status: DC
  Administered 2019-08-04: 81 mg via ORAL
  Filled 2019-08-03: qty 1

## 2019-08-03 SURGICAL SUPPLY — 84 items
BAG DECANTER FOR FLEXI CONT (MISCELLANEOUS) ×3 IMPLANT
BAG SNAP BAND KOVER 36X36 (MISCELLANEOUS) ×3 IMPLANT
BLADE CLIPPER SURG (BLADE) IMPLANT
BLADE OSCILLATING /SAGITTAL (BLADE) IMPLANT
BLADE STERNUM SYSTEM 6 (BLADE) IMPLANT
BLADE SURG 10 STRL SS (BLADE) IMPLANT
CABLE ADAPT CONN TEMP 6FT (ADAPTER) ×3 IMPLANT
CATH DIAG EXPO 6F AL1 (CATHETERS) ×3 IMPLANT
CATH DIAG EXPO 6F VENT PIG 145 (CATHETERS) ×6 IMPLANT
CATH EXTERNAL FEMALE PUREWICK (CATHETERS) IMPLANT
CATH INFINITI 6F AL2 (CATHETERS) ×3 IMPLANT
CATH S G BIP PACING (CATHETERS) ×3 IMPLANT
CHLORAPREP W/TINT 26 (MISCELLANEOUS) ×3 IMPLANT
CLIP VESOCCLUDE MED 24/CT (CLIP) IMPLANT
CLIP VESOCCLUDE SM WIDE 24/CT (CLIP) IMPLANT
CLOSURE MYNX CONTROL 6F/7F (Vascular Products) ×3 IMPLANT
CNTNR URN SCR LID CUP LEK RST (MISCELLANEOUS) ×4 IMPLANT
CONT SPEC 4OZ STRL OR WHT (MISCELLANEOUS) ×6
COVER BACK TABLE 80X110 HD (DRAPES) ×3 IMPLANT
COVER WAND RF STERILE (DRAPES) ×3 IMPLANT
DECANTER SPIKE VIAL GLASS SM (MISCELLANEOUS) ×3 IMPLANT
DERMABOND ADVANCED (GAUZE/BANDAGES/DRESSINGS) ×2
DERMABOND ADVANCED .7 DNX12 (GAUZE/BANDAGES/DRESSINGS) ×4 IMPLANT
DEVICE CLOSURE PERCLS PRGLD 6F (VASCULAR PRODUCTS) ×4 IMPLANT
DRAPE INCISE IOBAN 66X45 STRL (DRAPES) IMPLANT
DRSG TEGADERM 4X4.75 (GAUZE/BANDAGES/DRESSINGS) ×6 IMPLANT
ELECT CAUTERY BLADE 6.4 (BLADE) IMPLANT
ELECT REM PT RETURN 9FT ADLT (ELECTROSURGICAL) ×3
ELECTRODE REM PT RTRN 9FT ADLT (ELECTROSURGICAL) ×2 IMPLANT
FELT TEFLON 6X6 (MISCELLANEOUS) IMPLANT
GAUZE SPONGE 2X2 8PLY STRL LF (GAUZE/BANDAGES/DRESSINGS) ×4 IMPLANT
GAUZE SPONGE 4X4 12PLY STRL (GAUZE/BANDAGES/DRESSINGS) ×3 IMPLANT
GLOVE BIO SURGEON STRL SZ7.5 (GLOVE) ×3 IMPLANT
GLOVE BIO SURGEON STRL SZ8 (GLOVE) IMPLANT
GLOVE EUDERMIC 7 POWDERFREE (GLOVE) IMPLANT
GLOVE ORTHO TXT STRL SZ7.5 (GLOVE) ×3 IMPLANT
GOWN STRL REUS W/ TWL LRG LVL3 (GOWN DISPOSABLE) ×2 IMPLANT
GOWN STRL REUS W/ TWL XL LVL3 (GOWN DISPOSABLE) ×6 IMPLANT
GOWN STRL REUS W/TWL LRG LVL3 (GOWN DISPOSABLE) ×3
GOWN STRL REUS W/TWL XL LVL3 (GOWN DISPOSABLE) ×9
GUIDEWIRE SAFE TJ AMPLATZ EXST (WIRE) ×3 IMPLANT
INSERT FOGARTY SM (MISCELLANEOUS) IMPLANT
KIT BASIN OR (CUSTOM PROCEDURE TRAY) ×3 IMPLANT
KIT HEART LEFT (KITS) ×3 IMPLANT
KIT SUCTION CATH 14FR (SUCTIONS) IMPLANT
KIT TURNOVER KIT B (KITS) ×3 IMPLANT
LOOP VESSEL MAXI BLUE (MISCELLANEOUS) IMPLANT
LOOP VESSEL MINI RED (MISCELLANEOUS) IMPLANT
NS IRRIG 1000ML POUR BTL (IV SOLUTION) ×3 IMPLANT
PACK ENDO MINOR (CUSTOM PROCEDURE TRAY) ×3 IMPLANT
PAD ARMBOARD 7.5X6 YLW CONV (MISCELLANEOUS) ×6 IMPLANT
PAD ELECT DEFIB RADIOL ZOLL (MISCELLANEOUS) ×3 IMPLANT
PENCIL BUTTON HOLSTER BLD 10FT (ELECTRODE) IMPLANT
PERCLOSE PROGLIDE 6F (VASCULAR PRODUCTS) ×6
POSITIONER HEAD DONUT 9IN (MISCELLANEOUS) ×3 IMPLANT
SET MICROPUNCTURE 5F STIFF (MISCELLANEOUS) ×3 IMPLANT
SHEATH BRITE TIP 7FR 35CM (SHEATH) ×3 IMPLANT
SHEATH PINNACLE 6F 10CM (SHEATH) ×3 IMPLANT
SHEATH PINNACLE 8F 10CM (SHEATH) ×3 IMPLANT
SLEEVE REPOSITIONING LENGTH 30 (MISCELLANEOUS) ×3 IMPLANT
SPONGE GAUZE 2X2 STER 10/PKG (GAUZE/BANDAGES/DRESSINGS) ×2
SPONGE LAP 18X18 RF (DISPOSABLE) ×3 IMPLANT
STOPCOCK MORSE 400PSI 3WAY (MISCELLANEOUS) ×6 IMPLANT
SUT ETHIBOND X763 2 0 SH 1 (SUTURE) IMPLANT
SUT GORETEX CV 4 TH 22 36 (SUTURE) IMPLANT
SUT GORETEX CV4 TH-18 (SUTURE) IMPLANT
SUT MNCRL AB 3-0 PS2 18 (SUTURE) IMPLANT
SUT PROLENE 5 0 C 1 36 (SUTURE) IMPLANT
SUT PROLENE 6 0 C 1 30 (SUTURE) IMPLANT
SUT SILK  1 MH (SUTURE) ×3
SUT SILK 1 MH (SUTURE) ×2 IMPLANT
SUT VIC AB 2-0 CT1 27 (SUTURE)
SUT VIC AB 2-0 CT1 TAPERPNT 27 (SUTURE) IMPLANT
SUT VIC AB 2-0 CTX 36 (SUTURE) IMPLANT
SUT VIC AB 3-0 SH 8-18 (SUTURE) IMPLANT
SYR 50ML LL SCALE MARK (SYRINGE) ×3 IMPLANT
SYR BULB IRRIG 60ML STRL (SYRINGE) IMPLANT
SYR MEDRAD MARK V 150ML (SYRINGE) ×3 IMPLANT
TOWEL GREEN STERILE (TOWEL DISPOSABLE) ×6 IMPLANT
TRANSDUCER W/STOPCOCK (MISCELLANEOUS) ×6 IMPLANT
TRAY FOLEY SLVR 16FR TEMP STAT (SET/KITS/TRAYS/PACK) IMPLANT
VALVE 26 ULTRA SAPIEN KIT (Valve) ×3 IMPLANT
WIRE EMERALD 3MM-J .035X150CM (WIRE) ×3 IMPLANT
WIRE EMERALD 3MM-J .035X260CM (WIRE) ×3 IMPLANT

## 2019-08-03 NOTE — Transfer of Care (Signed)
Immediate Anesthesia Transfer of Care Note  Patient: Cindy Richmond  Procedure(s) Performed: TRANSCATHETER AORTIC VALVE REPLACEMENT, TRANSFEMORAL (N/A ) Intraoperative Transthoracic Echocardiogram  Patient Location: Cath Lab  Anesthesia Type:MAC  Level of Consciousness: awake, alert  and oriented  Airway & Oxygen Therapy: Patient Spontanous Breathing and Patient connected to nasal cannula oxygen  Post-op Assessment: Report given to RN, Post -op Vital signs reviewed and stable and Patient moving all extremities  Post vital signs: Reviewed and stable  Last Vitals:  Vitals Value Taken Time  BP    Temp    Pulse 74 08/03/19 1621  Resp 17 08/03/19 1621  SpO2 95 % 08/03/19 1621  Vitals shown include unvalidated device data.  Last Pain:  Vitals:   08/03/19 1222  TempSrc:   PainSc: 0-No pain         Complications: No apparent anesthesia complications

## 2019-08-03 NOTE — Op Note (Signed)
HEART AND VASC[facility]ENTER   MULTIDISCIPLINARY HEART VALVE TEAM   TAVR OPERATIVE NOTE   Date of Procedure:  08/03/2019  Preoperative Diagnosis: Severe Aortic Stenosis   Postoperative Diagnosis: Same   Procedure:    Transcatheter Aortic Valve Replacement - Percutaneous Right Transfemoral Approach  Edwards Sapien 3 Ultra THV (size 26 mm, model # 9750TFX, serial # [id number])   Co-Surgeons:  Valentina Gu. Roxy Manns, MD and Lauree Chandler, MD  Anesthesiologist:  Arabella Merles, MD  Echocardiographer:  Jenkins Rouge, MD  Pre-operative Echo Findings:  Severe aortic stenosis  Normal left ventricular systolic function  Post-operative Echo Findings:  No paravalvular leak  Normal left ventricular systolic function   BRIEF CLINICAL NOTE AND INDICATIONS FOR SURGERY  Patient is a 69 year old obese female with history of aortic stenosis, recently diagnosed persistent atrial fibrillation on long-term anticoagulation, hypertension, hyperlipidemia, hypothyroidism, and Parkinson's disease with limited physical mobility who has been referred for surgical consultation to discuss treatment options for management of severe symptomatic aortic stenosis.  Patient states that she has known of presence of a heart murmur for several years.  She denies any known history of rheumatic fever or rheumatic heart disease.  She has been followed by Dr. Stanford Breed with known history of aortic stenosis that has gradually progressed on follow-up echocardiography.  The patient states that over the last 2 to 3 years she has developed worsening exertional shortness of breath and fatigue.  She now gets short of breath with very low level activity and she feels as though she just cannot do much of anything without getting tired or winded.  She denies resting shortness of breath, PND, dizzy spells, or syncope.  She has some orthopnea.  She was recently noted to be in atrial fibrillation having previously always been in  sinus rhythm.  She is now on Eliquis for long-term anticoagulation.  Follow-up echocardiogram performed May 11, 2019 revealed normal left ventricular systolic function with ejection fraction estimated 60 to 65%.  There was severe aortic stenosis.  Peak velocity across aortic valve measured 4.0 m/s corresponding to mean transvalvular gradient estimated 42 mmHg and aortic valve area calculated only 0.43 cm.  The DVI was notably quite low at 0.17.  The patient was referred to the multidisciplinary heart valve clinic and underwent diagnostic cardiac catheterization on Jul 05, 2019.  Catheterization revealed normal coronary artery anatomy with no significant coronary artery disease.  Catheterization confirmed the presence of severe aortic stenosis with peak to peak and mean transvalvular gradients measured 54 and 28.6 mmHg, corresponding to aortic valve area calculated 0.9 cm.  Pulmonary artery pressures were mildly elevated.  CT angiography was performed and the patient referred for surgical consultation.  During the course of the patient's preoperative work up they have been evaluated comprehensively by a multidisciplinary team of specialists coordinated through the [facility] in the [facility].  They have been demonstrated to suffer from symptomatic severe aortic stenosis as noted above. The patient has been counseled extensively as to the relative risks and benefits of all options for the treatment of severe aortic stenosis including long term medical therapy, conventional surgery for aortic valve replacement, and transcatheter aortic valve replacement.  All questions have been answered, and the patient provides full informed consent for the operation as described.   DETAILS OF THE OPERATIVE PROCEDURE  PREPARATION:    The patient is brought to the operating room on the above mentioned date and appropriate monitoring was established by the  anesthesia  team. The patient is placed in the supine position on the operating table.  Intravenous antibiotics are administered. The patient is monitored closely throughout the procedure under conscious sedation.  Baseline transthoracic echocardiogram was performed. The patient's chest, abdomen, both groins, and both lower extremities are prepared and draped in a sterile manner. A time out procedure is performed.   PERIPHERAL ACCESS:    Using the modified Seldinger technique, femoral arterial and venous access was obtained with placement of 6 Fr sheaths on the left side.  A pigtail diagnostic catheter was passed through the left arterial sheath under fluoroscopic guidance into the aortic root.  A temporary transvenous pacemaker catheter was passed through the left femoral venous sheath under fluoroscopic guidance into the right ventricle.  The pacemaker was tested to ensure stable lead placement and pacemaker capture. Aortic root angiography was performed in order to determine the optimal angiographic angle for valve deployment.   TRANSFEMORAL ACCESS:   Percutaneous transfemoral access and sheath placement was performed using ultrasound guidance.  The right common femoral artery was cannulated using a micropuncture needle and appropriate location was verified using hand injection angiogram.  A pair of Abbott Perclose percutaneous closure devices were placed and a 6 French sheath replaced into the femoral artery.  The patient was heparinized systemically and ACT verified > 250 seconds.    A 14 Fr transfemoral E-sheath was introduced into the right common femoral artery after progressively dilating over an Amplatz superstiff wire. An AL-1 catheter was used to direct a straight-tip exchange length wire across the native aortic valve into the left ventricle. This was exchanged out for a pigtail catheter and position was confirmed in the LV apex. Simultaneous LV and Ao pressures were recorded.  The pigtail catheter  was exchanged for an Amplatz Extra-stiff wire in the LV apex.  Echocardiography was utilized to confirm appropriate wire position and no sign of entanglement in the mitral subvalvular apparatus.   TRANSCATHETER HEART VALVE DEPLOYMENT:   An Edwards Sapien 3 Ultra transcatheter heart valve (size 26 mm, model #[id number], serial #[id number]) was prepared and crimped per manufacturer's guidelines, and the proper orientation of the valve is confirmed on the [employer] delivery system. The valve was advanced through the introducer sheath using normal technique until in an appropriate position in the abdominal aorta beyond the sheath tip. The balloon was then retracted and using the fine-tuning wheel was centered on the valve. The valve was then advanced across the aortic arch using appropriate flexion of the catheter. The valve was carefully positioned across the aortic valve annulus. The Commander catheter was retracted using normal technique. Once final position of the valve has been confirmed by angiographic assessment, the valve is deployed while temporarily holding ventilation and during rapid ventricular pacing to maintain systolic blood pressure < 50 mmHg and pulse pressure < 10 mmHg. The balloon inflation is held for >3 seconds after reaching full deployment volume. Once the balloon has fully deflated the balloon is retracted into the ascending aorta and valve function is assessed using echocardiography. There is felt to be no paravalvular leak and no central aortic insufficiency.  The patient's hemodynamic recovery following valve deployment is good.  The deployment balloon and guidewire are both removed.    PROCEDURE COMPLETION:   The sheath was removed and femoral artery closure performed.  Protamine was administered once femoral arterial repair was complete. The temporary pacemaker, pigtail catheters and femoral sheaths were removed with manual pressure used for hemostasis.  A Mynx  femoral closure  device was utilized following removal of the diagnostic sheath in the left femoral artery.  The patient tolerated the procedure well and is transported to the surgical intensive care in stable condition. There were no immediate intraoperative complications. All sponge instrument and needle counts are verified correct at completion of the operation.   No blood products were administered during the operation.  The patient received a total of 40 mL of intravenous contrast during the procedure.   Purcell Nails, MD 08/03/2019 4:04 PM

## 2019-08-03 NOTE — Anesthesia Postprocedure Evaluation (Signed)
Anesthesia Post Note  Patient: Marvelous P Chandler  Procedure(s) Performed: TRANSCATHETER AORTIC VALVE REPLACEMENT, TRANSFEMORAL (N/A ) Intraoperative Transthoracic Echocardiogram     Patient location during evaluation: Cath Lab Anesthesia Type: MAC Level of consciousness: awake and alert Pain management: pain level controlled Vital Signs Assessment: post-procedure vital signs reviewed and stable Respiratory status: spontaneous breathing, nonlabored ventilation, respiratory function stable and patient connected to nasal cannula oxygen Cardiovascular status: stable and blood pressure returned to baseline Postop Assessment: no apparent nausea or vomiting Anesthetic complications: no    Last Vitals:  Vitals:   08/03/19 1655 08/03/19 1700  BP: 134/86 134/71  Pulse: 73 79  Resp: 17 19  Temp: 36.5 C   SpO2: 95% 95%    Last Pain:  Vitals:   08/03/19 1222  TempSrc:   PainSc: 0-No pain                 Parthena Fergeson,W. EDMOND

## 2019-08-03 NOTE — Discharge Instructions (Signed)
ACTIVITY AND EXERCISE °• Daily activity and exercise are an important part of your recovery. People recover at different rates depending on their general health and type of valve procedure. °• Most people recovering from TAVR feel better relatively quickly  °• No lifting, pushing, pulling more than 10 pounds (examples to avoid: groceries, vacuuming, gardening, golfing): °            - For one week with a procedure through the groin. °            - For six weeks for procedures through the chest wall or neck. °NOTE: You will typically see one of our providers 7-14 days after your procedure to discuss WHEN TO RESUME the above activities.  °  °  °DRIVING °• Do not drive until you are seen for follow up and cleared by a provider. Generally, we ask patient to not drive for 1 week after their procedure. °• If you have been told by your doctor in the past that you may not drive, you must talk with him/her before you begin driving again. °  °DRESSING °• Groin site: you may leave the clear dressing over the site for up to one week or until it falls off. °  °HYGIENE °• If you had a femoral (leg) procedure, you may take a shower when you return home. After the shower, pat the site dry. Do NOT use powder, oils or lotions in your groin area until the site has completely healed. °• If you had a chest procedure, you may shower when you return home unless specifically instructed not to by your discharging practitioner. °            - DO NOT scrub incision; pat dry with a towel. °            - DO NOT apply any lotions, oils, powders to the incision. °            - No tub baths / swimming for at least 2 weeks. °• If you notice any fevers, chills, increased pain, swelling, bleeding or pus, please contact your doctor. °  °ADDITIONAL INFORMATION °• If you are going to have an upcoming dental procedure, please contact our office as you will require antibiotics ahead of time to prevent infection on your heart valve.  ° ° °If you have any  questions or concerns you can call the structural heart phone during normal business hours 8am-4pm. If you have an urgent need after hours or weekends please call [phone] to talk to the on call provider for general cardiology. If you have an emergency that requires immediate attention, please call 911.  ° ° °After TAVR Checklist ° °Check  Test Description  ° Follow up appointment in 1-2 weeks  You will see our structural heart physician assistant, Katie Zylon Creamer. Your incision sites will be checked and you will be cleared to drive and resume all normal activities if you are doing well.    ° 1 month echo and follow up  You will have an echo to check on your new heart valve and be seen back in the office by Katie Cherokee Boccio. Many times the echo is not read by your appointment time, but Katie will call you later that day or the following day to report your results.  ° Follow up with your primary cardiologist You will need to be seen by your primary cardiologist in the following 3-6 months after your 1 month appointment in the valve   clinic. Often times your Plavix or Aspirin will be discontinued during this time, but this is decided on a case by case basis.   ° 1 year echo and follow up You will have another echo to check on your heart valve after 1 year and be seen back in the office by Katie Barbarajean Kinzler. This your last structural heart visit.  ° Bacterial endocarditis prophylaxis  You will have to take antibiotics for the rest of your life before all dental procedures (even teeth cleanings) to protect your heart valve. Antibiotics are also required before some surgeries. Please check with your cardiologist before scheduling any surgeries. Also, please make sure to tell us if you have a penicillin allergy as you will require an alternative antibiotic.   ° ° °

## 2019-08-03 NOTE — Interval H&P Note (Signed)
History and Physical Interval Note:  08/03/2019 2:07 PM  Cindy Richmond  has presented today for surgery, with the diagnosis of Severe Aortic Stenosis.  The various methods of treatment have been discussed with the patient and family. After consideration of risks, benefits and other options for treatment, the patient has consented to  Procedure(s): TRANSCATHETER AORTIC VALVE REPLACEMENT, TRANSFEMORAL (N/A) Intraoperative Transthoracic Echocardiogram as a surgical intervention.  The patient's history has been reviewed, patient examined, no change in status, stable for surgery.  I have reviewed the patient's chart and labs.  Questions were answered to the patient's satisfaction.     Purcell Nails

## 2019-08-03 NOTE — Progress Notes (Signed)
Arrived to 4 E from cath lab.   Pt A&O x 4.  Vital signs, access sites monitoring per protocol.  Bilateral groins & R radial WNL. CCMD notified.

## 2019-08-03 NOTE — Anesthesia Procedure Notes (Signed)
Procedure Name: MAC Date/Time: 08/03/2019 2:30 PM Performed by: Amadeo Garnet, CRNA Pre-anesthesia Checklist: Patient identified, Emergency Drugs available, Suction available and Patient being monitored Patient Re-evaluated:Patient Re-evaluated prior to induction Oxygen Delivery Method: Simple face mask Preoxygenation: Pre-oxygenation with 100% oxygen Induction Type: IV induction Placement Confirmation: positive ETCO2 Dental Injury: Teeth and Oropharynx as per pre-operative assessment

## 2019-08-03 NOTE — CV Procedure (Signed)
HEART AND VASCULAR CENTER  TAVR OPERATIVE NOTE   Date of Procedure:  08/03/2019  Preoperative Diagnosis: Severe Aortic Stenosis   Postoperative Diagnosis: Same   Procedure:    Transcatheter Aortic Valve Replacement - Transfemoral Approach  Edwards Sapien 3 THV (size 26 mm, model # [id number], serial # [id number])   Co-Surgeons:  Lauree Chandler, MD and Valentina Gu. Roxy Manns, MD   Anesthesiologist:            Ola Spurr  Echocardiographer:  Johnsie Cancel  Pre-operative Echo Findings:  Severe aortic stenosis  Normal left ventricular systolic function  Post-operative Echo Findings:  No paravalvular leak  Normal left ventricular systolic function  BRIEF CLINICAL NOTE AND INDICATIONS FOR SURGERY  69 yo female with history of GERD, HTN, hypothyroidism, Parkinson's disease, atrial fibrillation on coumadin and severe aortic stenosis who is here today for TAVR. Echo March 2021 with LVEF=60-65%. Severe aortic stenosis with mean gradient 42 mmHg, peak gradient 65 mmHg, AVA 0.4 cm2, dimensionless index 0.17. Cardiac cath 07/05/19 with no evidence of CAD. She was found to be in atrial fibrillation on the day of her heart cath and was started on Eliquis.   During the course of the patient's preoperative work up they have been evaluated comprehensively by a multidisciplinary team of specialists coordinated through the [facility] in the [place] [facility].  They have been demonstrated to suffer from symptomatic severe aortic stenosis as noted above. The patient has been counseled extensively as to the relative risks and benefits of all options for the treatment of severe aortic stenosis including long term medical therapy, conventional surgery for aortic valve replacement, and transcatheter aortic valve replacement.  The patient has been independently evaluated by Dr. Roxy Manns with CT surgery and they are felt to be at high risk for conventional surgical aortic  valve replacement. The surgeon indicated the patient would be a poor candidate for conventional surgery. Based upon review of all of the patient's preoperative diagnostic tests they are felt to be candidate for transcatheter aortic valve replacement using the transfemoral approach as an alternative to high risk conventional surgery.    Following the decision to proceed with transcatheter aortic valve replacement, a discussion has been held regarding what types of management strategies would be attempted intraoperatively in the event of life-threatening complications, including whether or not the patient would be considered a candidate for the use of cardiopulmonary bypass and/or conversion to open sternotomy for attempted surgical intervention.  The patient has been advised of a variety of complications that might develop peculiar to this approach including but not limited to risks of death, stroke, paravalvular leak, aortic dissection or other major vascular complications, aortic annulus rupture, device embolization, cardiac rupture or perforation, acute myocardial infarction, arrhythmia, heart block or bradycardia requiring permanent pacemaker placement, congestive heart failure, respiratory failure, renal failure, pneumonia, infection, other late complications related to structural valve deterioration or migration, or other complications that might ultimately cause a temporary or permanent loss of functional independence or other long term morbidity.  The patient provides full informed consent for the procedure as described and all questions were answered preoperatively.    DETAILS OF THE OPERATIVE PROCEDURE  PREPARATION:   The patient is brought to the operating room on the above mentioned date and central monitoring was established by the anesthesia team including placement of a radial arterial line. The patient is placed in the supine position on the operating table.  Intravenous antibiotics are  administered. Conscious sedation is  used.   Baseline transthoracic echocardiogram was performed. The patient's chest, abdomen, both groins, and both lower extremities are prepared and draped in a sterile manner. A time out procedure is performed.   PERIPHERAL ACCESS:   Using the modified Seldinger technique, femoral arterial and venous access were obtained with placement of 6 Fr sheaths on the left side using u/s guidance.  A pigtail diagnostic catheter was passed through the femoral arterial sheath under fluoroscopic guidance into the aortic root.  A temporary transvenous pacemaker catheter was passed through the femoral venous sheath under fluoroscopic guidance into the right ventricle.  The pacemaker was tested to ensure stable lead placement and pacemaker capture. Aortic root angiography was performed in order to determine the optimal angiographic angle for valve deployment.  TRANSFEMORAL ACCESS:  A micropuncture kit was used to gain access to the right femoral artery using u/s guidance. Position confirmed with angiography. Pre-closure with double ProGlide closure devices. The patient was heparinized systemically and ACT verified > 250 seconds.    A 14 Fr transfemoral E-sheath was introduced into the right femoral artery after progressively dilating over an Amplatz superstiff wire. An AL-2 catheter was used to direct a straight-tip exchange length wire across the native aortic valve into the left ventricle. This was exchanged out for a pigtail catheter and position was confirmed in the LV apex. Simultaneous LV and Ao pressures were recorded.  The pigtail catheter was then exchanged for an Amplatz Extra-stiff wire in the LV apex.   TRANSCATHETER HEART VALVE DEPLOYMENT:  An Edwards Sapien 3 THV (size 73m) was prepared and crimped per manufacturer's guidelines, and the proper orientation of the valve is confirmed on the [employer]ivery system. The valve was advanced through the introducer  sheath using normal technique until in an appropriate position in the abdominal aorta beyond the sheath tip. The balloon was then retracted and using the fine-tuning wheel was centered on the valve. The valve was then advanced across the aortic arch using appropriate flexion of the catheter. The valve was carefully positioned across the aortic valve annulus. The Commander catheter was retracted using normal technique. Once final position of the valve has been confirmed by angiographic assessment, the valve is deployed while temporarily holding ventilation and during rapid ventricular pacing to maintain systolic blood pressure < 50 mmHg and pulse pressure < 10 mmHg. The balloon inflation is held for >3 seconds after reaching full deployment volume. Once the balloon has fully deflated the balloon is retracted into the ascending aorta and valve function is assessed using TTE. There is felt to be no paravalvular leak and no central aortic insufficiency.  The patient's hemodynamic recovery following valve deployment is good.  The deployment balloon and guidewire are both removed. Echo demostrated acceptable post-procedural gradients, stable mitral valve function, and no AI.   PROCEDURE COMPLETION:  The sheath was then removed and closure devices were completed. Protamine was administered once femoral arterial repair was complete. The temporary pacemaker, pigtail catheters and femoral sheaths were removed with a Mynx closure device placed in the right femoral artery. Manual pressure used for venous hemostasis.    The patient tolerated the procedure well and is transported to the surgical intensive care in stable condition. There were no immediate intraoperative complications. All sponge instrument and needle counts are verified correct at completion of the operation.   No blood products were administered during the operation.  The patient received a total of 39.1 mL of intravenous contrast during the  procedure.  CHarrell Gave  Race Latour MD 08/03/2019 3:45 PM

## 2019-08-03 NOTE — Anesthesia Procedure Notes (Signed)
Arterial Line Insertion Start/End6/09/2019 1:00 PM, 08/03/2019 1:10 PM Performed by: Katherine Basset, RN  Patient location: Pre-op. Preanesthetic checklist: patient identified, IV checked, risks and benefits discussed, surgical consent, monitors and equipment checked and pre-op evaluation Right, radial was placed Catheter size: 20 G Hand hygiene performed , maximum sterile barriers used  and Seldinger technique used  Attempts: 2 Procedure performed without using ultrasound guided technique. Following insertion, Biopatch and dressing applied. Post procedure assessment: normal and unchanged  Post procedure complications: local hematoma. Patient tolerated the procedure well with no immediate complications.

## 2019-08-04 ENCOUNTER — Inpatient Hospital Stay (HOSPITAL_COMMUNITY): Payer: Medicare PPO

## 2019-08-04 DIAGNOSIS — Z952 Presence of prosthetic heart valve: Secondary | ICD-10-CM

## 2019-08-04 LAB — CBC
HCT: 47.4 % — ABNORMAL HIGH (ref 36.0–46.0)
Hemoglobin: 14.7 g/dL (ref 12.0–15.0)
MCH: 28.4 pg (ref 26.0–34.0)
MCHC: 31 g/dL (ref 30.0–36.0)
MCV: 91.7 fL (ref 80.0–100.0)
Platelets: 111 10*3/uL — ABNORMAL LOW (ref 150–400)
RBC: 5.17 MIL/uL — ABNORMAL HIGH (ref 3.87–5.11)
RDW: 15.3 % (ref 11.5–15.5)
WBC: 9.8 10*3/uL (ref 4.0–10.5)
nRBC: 0 % (ref 0.0–0.2)

## 2019-08-04 LAB — BASIC METABOLIC PANEL
Anion gap: 11 (ref 5–15)
BUN: 12 mg/dL (ref 8–23)
CO2: 23 mmol/L (ref 22–32)
Calcium: 8.8 mg/dL — ABNORMAL LOW (ref 8.9–10.3)
Chloride: 107 mmol/L (ref 98–111)
Creatinine, Ser: 0.84 mg/dL (ref 0.44–1.00)
GFR calc Af Amer: >60 mL/min (ref >60)
GFR calc non Af Amer: >60 mL/min (ref >60)
Glucose, Bld: 90 mg/dL (ref 70–99)
Potassium: 3.9 mmol/L (ref 3.5–5.1)
Sodium: 141 mmol/L (ref 135–145)

## 2019-08-04 LAB — MAGNESIUM: Magnesium: 1.9 mg/dL (ref 1.7–2.4)

## 2019-08-04 MED ORDER — BISOPROLOL-HYDROCHLOROTHIAZIDE 5-6.25 MG PO TABS
1.0000 | ORAL_TABLET | Freq: Every day | ORAL | Status: DC
  Administered 2019-08-04: 1 via ORAL
  Filled 2019-08-04: qty 1

## 2019-08-04 MED ORDER — ASPIRIN 81 MG PO CHEW: 81.0000 mg | CHEWABLE_TABLET | Freq: Every day | ORAL | Status: DC

## 2019-08-04 MED ORDER — BISOPROLOL-HYDROCHLOROTHIAZIDE 5-6.25 MG PO TABS: 1.0000 | ORAL_TABLET | Freq: Once | ORAL | Status: DC

## 2019-08-04 NOTE — Progress Notes (Signed)
@  1610 Dr. Santiago Glad, on-call for cardiology, paged regarding pt's HR (baseline Afib but now Afib RVR for past 1.5 hours with HR sustaining in 100s-120s). Page returned, no orders received, will continue to monitor and convey to Day RN for Day Team.

## 2019-08-04 NOTE — Progress Notes (Signed)
CARDIAC REHAB PHASE I   Offered to walk with pt twice. First time pt c/o back pain. Upon return pt states she has been ambulating in room without difficulty. BP noted to be 159/113, waiting on pharmacy to send meds. Will f/u as able to encourage ambulation.  [id number] Reynold Bowen, RN BSN 08/04/2019 10:37 AM

## 2019-08-04 NOTE — Progress Notes (Signed)
  Echocardiogram 2D Echocardiogram limited has been performed.  Leta Jungling M 08/04/2019, 10:16 AM

## 2019-08-04 NOTE — Progress Notes (Signed)
CARDIAC REHAB PHASE I   PRE:  Rate/Rhythm: 88 Afib  BP:  Sitting: 141/79      SaO2: 97 RA  MODE:  Ambulation: 100 ft   POST:  Rate/Rhythm: 97 Afib  BP:  Sitting: 135/103    SaO2: 96 RA  Pt helped to BR than ambulated 114ft in hallway assist of one with front wheel walker. Pt with visible SOB, denies pain or dizziness. Pt returned to recliner. Reviewed site care and restrictions, and encouraged ambulation as able with emphasis on safety. Pt declining CRP II at this time. Pt staying with sister after d/c.  [id number] Reynold Bowen, RN BSN 08/04/2019 1:26 PM

## 2019-08-04 NOTE — Discharge Summary (Addendum)
[facility]   MULTIDISCIPLINARY HEART VALVE TEAM  Discharge Summary    Patient ID: Cindy Richmond MRN: [id number]; DOB: 01/23/1951  Admit date: 08/03/2019 Discharge date: 08/04/2019  Primary Care Provider: Mila Palmer, MD  Primary Cardiologist: [name], MD / Dr. Aundra Dubin & Dr. Cornelius Moras (TAVR)  Discharge Diagnoses    Principal Problem:   S/P TAVR (transcatheter aortic valve replacement) Active Problems:   Obstructive sleep apnea   Obesity, morbid (HCC)   Parkinson's disease (HCC)   Severe aortic stenosis   Atrial fibrillation, persistent (HCC)   Acute on chronic diastolic heart failure (HCC)   Allergies Allergies  Allergen Reactions  . Penicillins Other (See Comments)    unknown Did it involve swelling of the face/tongue/throat, SOB, or low BP? Unknown Did it involve sudden or severe rash/hives, skin peeling, or any reaction on the inside of your mouth or nose? Unknown Did you need to seek medical attention at a hospital or doctor's office? Unknown When did it last happen?newborn allergy If all above answers are "NO", may proceed with cephalosporin use.     Diagnostic Studies/Procedures    TAVR OPERATIVE NOTE   Date of Procedure:                08/03/2019  Preoperative Diagnosis:      Severe Aortic Stenosis   Postoperative Diagnosis:    Same   Procedure:        Transcatheter Aortic Valve Replacement - Percutaneous Right Transfemoral Approach             Edwards Sapien 3 Ultra THV (size 26 mm, model # 9750TFX, serial # [id number])              Co-Surgeons:                        Salvatore Decent. Cornelius Moras, MD and Verne Carrow, MD  Anesthesiologist:                  Rosezella Florida, MD  Echocardiographer:              Charlton Haws, MD  Pre-operative Echo Findings: ? Severe aortic stenosis ? Normal left ventricular systolic function  Post-operative Echo Findings: ? No paravalvular leak ? Normal left ventricular systolic  function  _____________   Echo 08/04/19: complete but pending formal read at the time of discharge    History of Present Illness     Cindy Richmond is a 69 y.o. female with a history of GERD, HTN, HLD, hypothyroidism, Parkinson's disease, atrial fibrillation (new at time of cath) on Eliquisand severe aortic stenosis who presented to [facility] on 08/03/19 for planned TAVR.   Patient states that she has known of presence of a heart murmur for several years.  She denies any known history of rheumatic fever or rheumatic heart disease.  She has been followed by Dr. Jens Som with known history of aortic stenosis that has gradually progressed on follow-up echocardiography.  The patient states that over the last 2 to 3 years she has developed worsening exertional shortness of breath and fatigue.  She now gets short of breath with very low level activity and she feels as though she just cannot do much of anything without getting tired or winded.  She was recently noted to be in atrial fibrillation having previously always been in sinus rhythm.  She is now on Eliquis for long-term anticoagulation. Follow-up echocardiogram performed May 11, 2019 revealed normal  left ventricular systolic function with ejection fraction estimated 60 to 65%.  There was severe aortic stenosis. Peak velocity across aortic valve measured 4.0 m/s corresponding to mean transvalvular gradient estimated 42 mmHg and aortic valve area calculated only 0.43 cm. The DVI was notably quite low at 0.17. The patient was referred to the multidisciplinary heart valve clinic and underwent diagnostic cardiac catheterization on Jul 05, 2019. Catheterization revealed normal coronary artery anatomy with no significant coronary artery disease.  Catheterization confirmed the presence of severe aortic stenosis with peak to peak and mean transvalvular gradients measured 54 and 28.6 mmHg, corresponding to aortic valve area calculated 0.9 cm.  Pulmonary artery pressures  were mildly elevated.  The patient has been evaluated by the multidisciplinary valve team and felt to have severe, symptomatic aortic stenosis and to be a suitable candidate for TAVR, which was set up for 08/03/19.    Hospital Course     Consultants: none  Severe AS: s/p successful TAVR with a 26 mm Edwards Sapien 3 Ultra THV via the TF approach on 08/03/19. Post operative echo completed but pending formal read (per my review no PVL and mean gradient ~8 mm Hg.) Groin sites are stable. ECG with afib with RVR. There is no high grade heart block. She will be resumed on home Eliquis with the addition of a baby aspirin x 6 months.   Persistent atrial fibrillation: found to be in newly diagnosed afib at the time of her cath on 5/10. She was started on Eliquis at that time. She has remained in afib since that time. HR elevated today. Will resume home Bisoprolol - HCTZ 5mg -6.25mg  now. We can consider cardioversion if she remains in afib after being back on her Eliquis for at least 3-4 weeks. Continue rate control for now.   HTN: BP elevated. Resume home meds now.   Acute on chronic diastolic CHF: as evidenced by an elevated BNP on pre admission lab work and progressive dyspnea on exertion. This has been treated with TAVR. She has been resumed on her home Ziac.   Incidental findings: pre TAVR CT showed mild right paratracheal lymphadenopathy, requiring no follow-up unless the patient has a history of malignancy. Subcentimeter hypodense left liver lesion is too small to characterize and requires no follow-up unless the patient has risk factors for liver malignancy. These will be discussed in the outpatient setting. _____________  Discharge Vitals Blood pressure (!) 166/92, pulse 99, temperature 98 F (36.7 C), temperature source Oral, resp. rate 16, height 5\' 4"  (1.626 m), weight 124.8 kg, SpO2 99 %.  Filed Weights   08/03/19 1144 08/04/19 0358  Weight: 122.1 kg 124.8 kg    Labs & Radiologic Studies     CBC Recent Labs    08/03/19 1650 08/04/19 0353  WBC  --  9.8  HGB 12.6 14.7  HCT 37.0 47.4*  MCV  --  91.7  PLT  --  275*   Basic Metabolic Panel Recent Labs    08/03/19 1650 08/04/19 0353  NA 141 141  K 4.0 3.9  CL 104 107  CO2  --  23  GLUCOSE 110* 90  BUN 12 12  CREATININE 0.70 0.84  CALCIUM  --  8.8*  MG  --  1.9   Liver Function Tests No results for input(s): AST, ALT, ALKPHOS, BILITOT, PROT, ALBUMIN in the last 72 hours. No results for input(s): LIPASE, AMYLASE in the last 72 hours. Cardiac Enzymes No results for input(s): CKTOTAL, CKMB, CKMBINDEX, TROPONINI in the  last 72 hours. BNP Invalid input(s): POCBNP D-Dimer No results for input(s): DDIMER in the last 72 hours. Hemoglobin A1C No results for input(s): HGBA1C in the last 72 hours. Fasting Lipid Panel No results for input(s): CHOL, HDL, LDLCALC, TRIG, CHOLHDL, LDLDIRECT in the last 72 hours. Thyroid Function Tests No results for input(s): TSH, T4TOTAL, T3FREE, THYROIDAB in the last 72 hours.  Invalid input(s): FREET3 _____________  DG Chest 2 View  Result Date: 07/30/2019 CLINICAL DATA:  Pre-op respiratory exam for TAVR. EXAM: CHEST - 2 VIEW COMPARISON:  04/22/2016 FINDINGS: The heart size and mediastinal contours are within normal limits. Increased elevation of right hemidiaphragm is noted, with atelectasis or scarring in the posterior right lower lobe. No evidence of pulmonary consolidation or edema. No evidence of pleural effusion. Left shoulder prosthesis noted. IMPRESSION: Increased elevation of right hemidiaphragm, with atelectasis versus scarring in posterior right lower lobe. Electronically Signed   By: Danae OrleansJohn A Stahl M.D.   On: 07/30/2019 10:18   CT CORONARY MORPH W/CTA COR W/SCORE W/CA W/CM &/OR WO/CM  Addendum Date: 07/14/2019   ADDENDUM REPORT: 07/14/2019 13:27 CLINICAL DATA:  Aortic stenosis EXAM: Cardiac TAVR CT TECHNIQUE: The patient was scanned on a Siemens Force 192 slice scanner. A 120 kV  retrospective scan was triggered in the descending thoracic aorta at 111 HU's. Gantry rotation speed was 270 msecs and collimation was .9 mm. No beta blockade or nitro were given. The 3D data set was reconstructed in 5% intervals of the R-R cycle. Systolic and diastolic phases were analyzed on a dedicated work station using MPR, MIP and VRT modes. The patient received 80 cc of contrast. FINDINGS: Aortic Valve: Tri leaflet calcified with restricted leaflet motion Aorta: Normal arch vessels no aneurysm minimal atherosclerosis Sinotubular Junction: 24 mm Ascending Thoracic Aorta: 34 mm Aortic Arch: 32 mm Descending Thoracic Aorta: 23 mm Sinus of Valsalva Measurements: Non-coronary: 29.7 mm Right - coronary: 28.3 mm Left - coronary: 29.9 mm Coronary Artery Height above Annulus: Left Main: 10.6 mm above annulus Right Coronary: 11.2 mm above annulus Virtual Basal Annulus Measurements: Maximum/Minimum Diameter: 27.7 mm x 21.2 mm Perimeter: 80 mm2 Area: 479 mm2 Coronary Arteries: Sufficient height above annulus for deployment Optimum Fluoroscopic Angle for Delivery: LAO 2 Caudal 2 degrees IMPRESSION: 1. Calcified tri leaflet AV with annular area of 479 mm2 suitable for a 26 mm Sapien 3 valve 2.  Normal aortic root diameter 3.4 cm 3.  Optimum angiographic angle for deployment LAO 2 Caudal 2 degrees 4.  Coronary arteries sufficient height above annulus for deployment Charlton HawsPeter Nishan Electronically Signed   By: Charlton HawsPeter  Nishan M.D.   On: 07/14/2019 13:27   Result Date: 07/14/2019 EXAM: OVER-READ INTERPRETATION  CT CHEST The following report is an over-read performed by radiologist Dr. Cleone SlimJason Poff [facility] on 07/14/2019. This over-read does not include interpretation of cardiac or coronary anatomy or pathology. The coronary CTA interpretation by the cardiologist is attached. COMPARISON:  None. FINDINGS: Please see the separate concurrent chest CT angiogram report for details. IMPRESSION: Please see the separate  concurrent chest CT angiogram report for details. Electronically Signed: By: Delbert PhenixJason A Poff M.D. On: 07/14/2019 12:05   DG Chest Port 1 View  Result Date: 08/03/2019 CLINICAL DATA:  69 year old female status post TAVR. EXAM: PORTABLE CHEST 1 VIEW COMPARISON:  Chest x-ray 08/19/2019. FINDINGS: Chronic elevation of the right hemidiaphragm is unchanged. No acute consolidative airspace disease. No pleural effusions. No pneumothorax. No suspicious appearing pulmonary nodules or masses are noted. No evidence  of pulmonary edema. Heart size is mildly enlarged (unchanged). Upper mediastinal contours are within normal limits. Sapien valve projecting over the aortic root. Status post left shoulder arthroplasty. IMPRESSION: 1. No radiographic evidence of acute cardiopulmonary disease. 2. Chronic elevation of the right hemidiaphragm is unchanged. 3. Status post TAVR. Electronically Signed   By: Trudie Reed M.D.   On: 08/03/2019 19:10   CT ANGIO CHEST AORTA W/CM & OR WO/CM  Result Date: 07/14/2019 CLINICAL DATA:  Severe aortic stenosis.  Pre-TAVR evaluation. EXAM: CT ANGIOGRAPHY CHEST, ABDOMEN AND PELVIS TECHNIQUE: Non-contrast CT of the chest was initially obtained. Multidetector CT imaging through the chest, abdomen and pelvis was performed using the standard protocol during bolus administration of intravenous contrast. Multiplanar reconstructed images and MIPs were obtained and reviewed to evaluate the vascular anatomy. CONTRAST:  OMNIPAQUE IOHEXOL 350 MG/ML SOLN COMPARISON:  None. FINDINGS: CTA CHEST FINDINGS Cardiovascular: Mild cardiomegaly. No significant pericardial effusion/thickening. Diffuse thickening and coarse calcification of the aortic valve. Mildly atherosclerotic nonaneurysmal thoracic aorta. Normal caliber pulmonary arteries. No central pulmonary emboli. Mediastinum/Nodes: No discrete thyroid nodules. Unremarkable esophagus. No axillary adenopathy. Mildly enlarged 1.1 cm right paratracheal node  (series 4/image 26). No additional pathologically enlarged mediastinal nodes. No hilar adenopathy. Lungs/Pleura: No pneumothorax. No pleural effusion. Moderate elevation of the right hemidiaphragm with associated passive right lung base atelectasis. No consolidative airspace disease, lung masses or significant pulmonary nodules. Musculoskeletal: No aggressive appearing focal osseous lesions. Partially visualized left shoulder arthroplasty. Mild thoracic spondylosis. CTA ABDOMEN AND PELVIS FINDINGS Hepatobiliary: Normal liver size. Subcentimeter hypodense left liver lesion is too small to characterize and requires no follow-up unless the patient has risk factors for liver malignancy. No additional liver lesions. Normal gallbladder with no radiopaque cholelithiasis. No biliary ductal dilatation. Pancreas: Normal, with no mass or duct dilation. Spleen: Normal size spleen. Coarse granulomatous splenic calcification. No splenic masses. Adrenals/Urinary Tract: No discrete adrenal nodules. No hydronephrosis. Simple 2.0 cm posterior upper right renal cyst. Otherwise no contour deforming renal lesions. Normal nondistended bladder. Stomach/Bowel: Small hiatal hernia. Otherwise normal nondistended stomach. Normal caliber small bowel with no small bowel wall thickening. Normal appendix. Mild sigmoid diverticulosis. No large bowel wall thickening or significant pericolonic fat stranding. Vascular/Lymphatic: Atherosclerotic nonaneurysmal abdominal aorta. No pathologically enlarged lymph nodes in the abdomen or pelvis. Reproductive: Status post hysterectomy, with no abnormal findings at the vaginal cuff. No adnexal mass. Other: No pneumoperitoneum, ascites or focal fluid collection. Musculoskeletal: No aggressive appearing focal osseous lesions. Marked lumbar spondylosis. VASCULAR MEASUREMENTS PERTINENT TO TAVR: AORTA: Minimal Aortic Diameter-15.0 x 12.8 mm Severity of Aortic Calcification-mild RIGHT PELVIS: Right Common Iliac  Artery - Minimal Diameter-10.7 x 8.6 mm Tortuosity-mild Calcification-mild Right External Iliac Artery - Minimal Diameter-6.1 x 5.5 mm Tortuosity-moderate Calcification-none Right Common Femoral Artery - Minimal Diameter-7.2 x 6.9 mm Tortuosity-mild Calcification-none LEFT PELVIS: Left Common Iliac Artery - Minimal Diameter-9.5 x 9.0 mm Tortuosity-mild Calcification-mild Left External Iliac Artery - Minimal Diameter-6.6 x 6.2 mm Tortuosity-moderate Calcification-none Left Common Femoral Artery - Minimal Diameter-7.5 x 6.7 mm Tortuosity-mild Calcification-none Review of the MIP images confirms the above findings. IMPRESSION: 1. Vascular findings and measurements pertinent to potential TAVR procedure, as detailed. 2. Diffuse thickening and coarse calcification of the aortic valve, compatible with the reported history of severe aortic stenosis. 3. Mild cardiomegaly. 4. Mild right paratracheal lymphadenopathy, requiring no follow-up unless the patient has a history of malignancy. This recommendation follows ACR consensus guidelines: Managing Incidental Findings on Thoracic CT: Mediastinal and Cardiovascular Findings. A White Paper of  the ACR Incidental Findings Committee. J Am Coll Radiol. 2018; 15: 8119-1478. 5. Small hiatal hernia. 6. Mild sigmoid diverticulosis. 7.  Aortic Atherosclerosis (ICD10-I70.0). Electronically Signed   By: Delbert Phenix M.D.   On: 07/14/2019 12:38   ECHOCARDIOGRAM LIMITED  Result Date: 08/03/2019    ECHOCARDIOGRAM LIMITED REPORT   Patient Name:   MIRACLE MONGILLO Date of Exam: 08/03/2019 Medical Rec #:  [id number]     Height:       64.0 in Accession #:    [id number]    Weight:       269.1 lb Date of Birth:  1950-03-08      BSA:          2.219 m Patient Age:    69 years      BP:           124/102 mmHg Patient Gender: F             HR:           71 bpm. Exam Location:  Inpatient Procedure: Limited Echo, Limited Color Doppler and Cardiac Doppler Indications:    Aortic Stenosis 424.1 / 135.0  History:         Patient has prior history of Echocardiogram examinations, most                 recent 05/12/2019. Arrythmias:Atrial Fibrillation,                 Signs/Symptoms:Murmur; Risk Factors:Hypertension, Dyslipidemia                 and Sleep Apnea. Parkinson's Disease.                 Aortic Valve: 26 mm Edwards Sapien prosthetic, stented (TAVR)                 valve is present in the aortic position. Procedure Date:                 08/03/2019.  Sonographer:    Leta Jungling RDCS Referring Phys: [id number] CHRISTOPHER D MCALHANY IMPRESSIONS  1. Left ventricular ejection fraction, by estimation, is 55 to 60%. The left ventricle has normal function. There is mild left ventricular hypertrophy. Left ventricular diastolic function could not be evaluated.  2. Mild mitral valve regurgitation.  3. Pre TAVR: Tri lealfet AV severely calcified with restricted leaflet motion Some motion of the non coronary cusp still present. Severe AS with peak velocity 3.6 m/sec mean gradient 31 mmhg peak 52 mmHg and AVA 0.63 cm2         Post TAVR: well positioned 26 mm Sapien 3 valve No significant PVL. Peak velocity 1.4 m/sec peak gradient 8 mmHg mean 4 mmHg AVA 2.2 cm2 . The aortic valve has been repaired/replaced. There is a 26 mm Edwards Sapien prosthetic (TAVR) valve present in  the aortic position. Procedure Date: 08/03/2019. FINDINGS  Left Ventricle: Left ventricular ejection fraction, by estimation, is 55 to 60%. The left ventricle has normal function. The left ventricular internal cavity size was normal in size. There is mild left ventricular hypertrophy. Pericardium: There is no evidence of pericardial effusion. Mitral Valve: There is mild thickening of the mitral valve leaflet(s). There is mild calcification of the mitral valve leaflet(s). Mild mitral valve regurgitation. Tricuspid Valve: The tricuspid valve is not assessed. Aortic Valve: Pre TAVR: Tri lealfet AV severely calcified with restricted leaflet motion Some motion of the non  coronary cusp still present. Severe AS with  peak velocity 3.6 m/sec mean gradient 31 mmhg peak 52 mmHg and AVA 0.63 cm2 Post TAVR: well positioned 26 mm Sapien 3 valve No significant PVL. Peak velocity 1.4 m/sec peak gradient 8 mmHg mean 4 mmHg AVA 2.2 cm2. The aortic valve has been repaired/replaced. Aortic valve mean gradient measures 31.0 mmHg. Aortic valve peak gradient measures 52.4 mmHg. Aortic valve area, by VTI measures 0.63 cm. There is a 26 mm Edwards Sapien prosthetic, stented (TAVR) valve present in the aortic position. Procedure Date: 08/03/2019. Pulmonic Valve: The pulmonic valve was not assessed.  LEFT VENTRICLE PLAX 2D LVOT diam:     1.90 cm LV SV:         48 LV SV Index:   22 LVOT Area:     2.84 cm  AORTIC VALVE AV Area (Vmax):    0.61 cm AV Area (Vmean):   0.56 cm AV Area (VTI):     0.63 cm AV Vmax:           362.00 cm/s AV Vmean:          262.000 cm/s AV VTI:            0.761 m AV Peak Grad:      52.4 mmHg AV Mean Grad:      31.0 mmHg LVOT Vmax:         77.60 cm/s LVOT Vmean:        52.200 cm/s LVOT VTI:          0.170 m LVOT/AV VTI ratio: 0.22  SHUNTS Systemic VTI:  0.17 m Systemic Diam: 1.90 cm Charlton Haws MD Electronically signed by Charlton Haws MD Signature Date/Time: 08/03/2019/3:48:35 PM    Final    Structural Heart Procedure  Result Date: 08/03/2019 See surgical note for result.  CT ANGIO ABDOMEN PELVIS  W &/OR WO CONTRAST  Result Date: 07/14/2019 CLINICAL DATA:  Severe aortic stenosis.  Pre-TAVR evaluation. EXAM: CT ANGIOGRAPHY CHEST, ABDOMEN AND PELVIS TECHNIQUE: Non-contrast CT of the chest was initially obtained. Multidetector CT imaging through the chest, abdomen and pelvis was performed using the standard protocol during bolus administration of intravenous contrast. Multiplanar reconstructed images and MIPs were obtained and reviewed to evaluate the vascular anatomy. CONTRAST:  OMNIPAQUE IOHEXOL 350 MG/ML SOLN COMPARISON:  None. FINDINGS: CTA CHEST FINDINGS  Cardiovascular: Mild cardiomegaly. No significant pericardial effusion/thickening. Diffuse thickening and coarse calcification of the aortic valve. Mildly atherosclerotic nonaneurysmal thoracic aorta. Normal caliber pulmonary arteries. No central pulmonary emboli. Mediastinum/Nodes: No discrete thyroid nodules. Unremarkable esophagus. No axillary adenopathy. Mildly enlarged 1.1 cm right paratracheal node (series 4/image 26). No additional pathologically enlarged mediastinal nodes. No hilar adenopathy. Lungs/Pleura: No pneumothorax. No pleural effusion. Moderate elevation of the right hemidiaphragm with associated passive right lung base atelectasis. No consolidative airspace disease, lung masses or significant pulmonary nodules. Musculoskeletal: No aggressive appearing focal osseous lesions. Partially visualized left shoulder arthroplasty. Mild thoracic spondylosis. CTA ABDOMEN AND PELVIS FINDINGS Hepatobiliary: Normal liver size. Subcentimeter hypodense left liver lesion is too small to characterize and requires no follow-up unless the patient has risk factors for liver malignancy. No additional liver lesions. Normal gallbladder with no radiopaque cholelithiasis. No biliary ductal dilatation. Pancreas: Normal, with no mass or duct dilation. Spleen: Normal size spleen. Coarse granulomatous splenic calcification. No splenic masses. Adrenals/Urinary Tract: No discrete adrenal nodules. No hydronephrosis. Simple 2.0 cm posterior upper right renal cyst. Otherwise no contour deforming renal lesions. Normal nondistended bladder. Stomach/Bowel: Small hiatal hernia. Otherwise normal nondistended stomach. Normal caliber small bowel  with no small bowel wall thickening. Normal appendix. Mild sigmoid diverticulosis. No large bowel wall thickening or significant pericolonic fat stranding. Vascular/Lymphatic: Atherosclerotic nonaneurysmal abdominal aorta. No pathologically enlarged lymph nodes in the abdomen or pelvis.  Reproductive: Status post hysterectomy, with no abnormal findings at the vaginal cuff. No adnexal mass. Other: No pneumoperitoneum, ascites or focal fluid collection. Musculoskeletal: No aggressive appearing focal osseous lesions. Marked lumbar spondylosis. VASCULAR MEASUREMENTS PERTINENT TO TAVR: AORTA: Minimal Aortic Diameter-15.0 x 12.8 mm Severity of Aortic Calcification-mild RIGHT PELVIS: Right Common Iliac Artery - Minimal Diameter-10.7 x 8.6 mm Tortuosity-mild Calcification-mild Right External Iliac Artery - Minimal Diameter-6.1 x 5.5 mm Tortuosity-moderate Calcification-none Right Common Femoral Artery - Minimal Diameter-7.2 x 6.9 mm Tortuosity-mild Calcification-none LEFT PELVIS: Left Common Iliac Artery - Minimal Diameter-9.5 x 9.0 mm Tortuosity-mild Calcification-mild Left External Iliac Artery - Minimal Diameter-6.6 x 6.2 mm Tortuosity-moderate Calcification-none Left Common Femoral Artery - Minimal Diameter-7.5 x 6.7 mm Tortuosity-mild Calcification-none Review of the MIP images confirms the above findings. IMPRESSION: 1. Vascular findings and measurements pertinent to potential TAVR procedure, as detailed. 2. Diffuse thickening and coarse calcification of the aortic valve, compatible with the reported history of severe aortic stenosis. 3. Mild cardiomegaly. 4. Mild right paratracheal lymphadenopathy, requiring no follow-up unless the patient has a history of malignancy. This recommendation follows ACR consensus guidelines: Managing Incidental Findings on Thoracic CT: Mediastinal and Cardiovascular Findings. A White Paper of the ACR Incidental Findings Committee. J Am Coll Radiol. 2018; 15: 4944-9675. 5. Small hiatal hernia. 6. Mild sigmoid diverticulosis. 7.  Aortic Atherosclerosis (ICD10-I70.0). Electronically Signed   By: Delbert Phenix M.D.   On: 07/14/2019 12:38   Disposition   Pt is being discharged home today in good condition.  Follow-up Plans & Appointments    Follow-up Information     Janetta Hora, PA-C. Go on 08/11/2019.   Specialties: Cardiology, Radiology Why: @ 1pm, please arrive at least 10 minutes early Contact information: [address] [place] [address] [phone]            Discharge Medications   Allergies as of 08/04/2019      Reactions   Penicillins Other (See Comments)   unknown Did it involve swelling of the face/tongue/throat, SOB, or low BP? Unknown Did it involve sudden or severe rash/hives, skin peeling, or any reaction on the inside of your mouth or nose? Unknown Did you need to seek medical attention at a hospital or doctor's office? Unknown When did it last happen?newborn allergy If all above answers are "NO", may proceed with cephalosporin use.      Medication List    TAKE these medications   apixaban 5 MG Tabs tablet Commonly known as: ELIQUIS Take 1 tablet (5 mg total) by mouth 2 (two) times daily.   aspirin 81 MG chewable tablet Chew 1 tablet (81 mg total) by mouth daily. Start taking on: August 05, 2019   bisoprolol-hydrochlorothiazide 5-6.25 MG tablet Commonly known as: ZIAC Take 1 tablet by mouth daily.   brimonidine 0.15 % ophthalmic solution Commonly known as: ALPHAGAN Place 1 drop into the left eye in the morning and at bedtime.   carbidopa-levodopa 25-100 MG tablet Commonly known as: SINEMET IR 2 TABLETS AT 8AM/2 TABLETS AT NOON/1 TABLET AT 4 PM/1 TABLET AT 8PM What changed: See the new instructions.   cholecalciferol 25 MCG (1000 UNIT) tablet Commonly known as: VITAMIN D Take 1,000 Units by mouth daily.   [employer] JOINT HEALTH TRIPLE ACTION PO Take 1 tablet  by mouth daily. Triple Action Joint Health   docusate sodium 100 MG capsule Commonly known as: COLACE Take 200 mg by mouth daily as needed (constipation.).   esomeprazole 20 MG capsule Commonly known as: NEXIUM Take 20 mg by mouth daily.   Fish Oil 1200 MG Caps Take 1,200 mg by mouth daily.   levothyroxine 75 MCG  tablet Commonly known as: SYNTHROID Take 75 mcg by mouth daily before breakfast.   PRESERVISION AREDS PO Take 1 tablet by mouth daily.   ANTIOXIDANT PO Take 1 tablet by mouth daily at 6 (six) AM. MitoQ Eye (MitoQ w/Lutein, Zeaxanthin, Bilberry and Maritime Cisco Extract)   Salonpas Pain Relief Patch Ptch Place 1 patch onto the skin daily as needed (pain.).   SYSTANE OP Place 1 drop into both eyes in the morning and at bedtime.   Systane 0.4-0.3 % Gel ophthalmic gel Generic drug: Polyethyl Glycol-Propyl Glycol Place 1 application into both eyes daily.   Vitamin B-12 5000 MCG Tbdp Take 5,000 mcg by mouth daily.           Outstanding Labs/Studies   none  Duration of Discharge Encounter   Greater than 30 minutes including physician time.  SignedCline Crock, PA-C 08/04/2019, 10:46 AM [phone]

## 2019-08-05 ENCOUNTER — Telehealth: Payer: Self-pay | Admitting: Physician Assistant

## 2019-08-05 LAB — ECHOCARDIOGRAM LIMITED
Height: 64 in
Weight: 4403.2 oz

## 2019-08-05 MED FILL — Magnesium Sulfate Inj 50%: INTRAMUSCULAR | Qty: 10 | Status: AC

## 2019-08-05 MED FILL — Potassium Chloride Inj 2 mEq/ML: INTRAVENOUS | Qty: 40 | Status: AC

## 2019-08-05 MED FILL — Heparin Sodium (Porcine) Inj 1000 Unit/ML: INTRAMUSCULAR | Qty: 30 | Status: AC

## 2019-08-05 NOTE — Telephone Encounter (Signed)
  [facility]   MULTIDISCIPLINARY HEART VALVE TEAM   Patient contacted regarding discharge from [facility] on 6/9  Patient understands to follow up with provider Carlean Jews on 6/16 at [facility]  Patient understands discharge instructions? yes Patient understands medications and regimen? yes Patient understands to bring all medications to this visit? yes  Cline Crock PA-C  [facility]

## 2019-08-09 NOTE — Progress Notes (Signed)
HEART AND VASCULAR CENTER   MULTIDISCIPLINARY HEART VALVE CLINIC                                       Cardiology Office Note    Date:  08/11/2019   ID:  Baily, Hovanec 07/23/1950, MRN [id number]  PCP:  Mila Palmer, MD  Cardiologist: [name], MD / Dr. Clifton James & Dr. Cornelius Moras (TAVR)  CC: TOC s/p TAVR  History of Present Illness:  Cindy Richmond is a 69 y.o. female with a history of GERD, HTN, HLD, hypothyroidism, Parkinson's disease, atrial fibrillation (new at time of cath) on Eliquisand severe aortic stenosis s/p TAVR (08/03/19) who presents to clinic for follow up.   Patient states that she has known of presence of a heart murmur for several years. She denies any known history of rheumatic fever or rheumatic heart disease. She has been followed by Dr. Jens Som with known history of aortic stenosis that has gradually progressed on follow-up echocardiography. The patient states that over the last 2 to 3 years she has developed worsening exertional shortness of breath and fatigue. She now gets short of breath with very low level activity and she feels as though she just cannot do much of anything without getting tired or winded. She was recently noted to be in atrial fibrillation having previously always been in sinus rhythm. She is now on Eliquis for long-term anticoagulation. Follow-up echocardiogram performed May 11, 2019 revealed normal left ventricular systolic function with ejection fraction estimated 60 to 65%. There was severe aortic stenosis. Peak velocity across aortic valve measured 4.0 m/s corresponding to mean transvalvular gradient estimated 42 mmHg and aortic valve area calculated only 0.43 cm. The DVI was notably quite low at 0.17. The patient was referred to the multidisciplinary heart valve clinic and underwent diagnostic cardiac catheterization on Jul 05, 2019. Catheterization revealed normal coronary artery anatomy with no significant coronary artery disease.  Catheterization confirmed the presence of severe aortic stenosis with peak to peak and mean transvalvular gradients measured 54 and 28.6 mmHg, corresponding to aortic valve area calculated 0.9 cm. Pulmonary artery pressures were mildly elevated.  She was evaluated by the multidisciplinary valve team and underwent successful TAVR with a 26 mm Edwards Sapien 3 Ultra THV via the TF approach on 08/03/19. Post operative echo showed EF 60%, no PVL and mean gradient 10 mm Hg. She was discharged on home Eliquis with the addition of a baby aspirin x 6 months.   Today she presents to clinic for follow up. No CP or SOB. No LE edema, orthopnea or PND. No dizziness or syncope. No blood in stool or urine. No palpitations. Breathing better today.    Past Medical History:  Diagnosis Date  . Atrial fibrillation, persistent (HCC)   . Dyspnea    with head low, sleep apnea  . Dysrhythmia    afib  . GERD (gastroesophageal reflux disease)   . H/O hiatal hernia   . Hematuria   . History of kidney stones   . HOH (hard of hearing)    sightly, bilaterally  . Hyperlipidemia   . Hypertension   . Hypothyroidism   . Parkinson disease (HCC)   . S/P TAVR (transcatheter aortic valve replacement) 08/03/2019   s/p TAVR with 26 mm Edwards S3U via the TF approach by Drs Aundra Dubin & Cornelius Moras  . Severe aortic stenosis   . Sleep apnea  Past Surgical History:  Procedure Laterality Date  . ABDOMINAL HYSTERECTOMY     lso  . CYSTOSCOPY W/ RETROGRADES  02/28/2012   Procedure: CYSTOSCOPY WITH RETROGRADE PYELOGRAM;  Surgeon: Sebastian Ache, MD;  Location: [facility];  Service: Urology;  Laterality: Right;  . CYSTOSCOPY WITH STENT PLACEMENT  02/28/2012   Procedure: CYSTOSCOPY WITH STENT PLACEMENT;  Surgeon: Sebastian Ache, MD;  Location: [facility];  Service: Urology;  Laterality: Left;  . CYSTOSCOPY/RETROGRADE/URETEROSCOPY/STONE EXTRACTION WITH BASKET  02/28/2012   Procedure:  CYSTOSCOPY/RETROGRADE/URETEROSCOPY/STONE EXTRACTION WITH BASKET;  Surgeon: Sebastian Ache, MD;  Location: [facility];  Service: Urology;  Laterality: Left;  . HOLMIUM LASER APPLICATION  02/28/2012   Procedure: HOLMIUM LASER APPLICATION;  Surgeon: Sebastian Ache, MD;  Location: [facility];  Service: Urology;  Laterality: Left;  . INTRAOPERATIVE TRANSTHORACIC ECHOCARDIOGRAM  08/03/2019   Procedure: Intraoperative Transthoracic Echocardiogram;  Surgeon: Kathleene Hazel, MD;  Location: [facility] OR;  Service: Open Heart Surgery;;  . KNEE ARTHROSCOPY     right  . REVERSE SHOULDER ARTHROPLASTY Left 07/22/2018   Procedure: REVERSE SHOULDER ARTHROPLASTY;  Surgeon: Bjorn Pippin, MD;  Location: [facility] ORS;  Service: Orthopedics;  Laterality: Left;  . RIGHT/LEFT HEART CATH AND CORONARY ANGIOGRAPHY N/A 07/05/2019   Procedure: RIGHT/LEFT HEART CATH AND CORONARY ANGIOGRAPHY;  Surgeon: Kathleene Hazel, MD;  Location: [facility] INVASIVE CV LAB;  Service: Cardiovascular;  Laterality: N/A;  . TONSILLECTOMY    . TRANSCATHETER AORTIC VALVE REPLACEMENT, TRANSFEMORAL N/A 08/03/2019   Procedure: TRANSCATHETER AORTIC VALVE REPLACEMENT, TRANSFEMORAL;  Surgeon: Kathleene Hazel, MD;  Location: [facility] OR;  Service: Open Heart Surgery;  Laterality: N/A;  . VEIN LIGATION      Current Medications: Outpatient Medications Prior to Visit  Medication Sig Dispense Refill  . apixaban (ELIQUIS) 5 MG TABS tablet Take 1 tablet (5 mg total) by mouth 2 (two) times daily. 60 tablet 6  . aspirin 81 MG chewable tablet Chew 1 tablet (81 mg total) by mouth daily.    . bisoprolol-hydrochlorothiazide (ZIAC) 5-6.25 MG tablet Take 1 tablet by mouth daily.    . brimonidine (ALPHAGAN) 0.15 % ophthalmic solution Place 1 drop into the left eye in the morning and at bedtime.     . carbidopa-levodopa (SINEMET IR) 25-100 MG tablet 2 TABLETS AT 8AM/2 TABLETS AT NOON/1 TABLET AT 4 PM/1 TABLET AT 8PM 540 tablet 1  .  cholecalciferol (VITAMIN D) 25 MCG (1000 UNIT) tablet Take 1,000 Units by mouth daily.    . Collagen-Boron-Hyaluronic Acid (CVS JOINT HEALTH TRIPLE ACTION PO) Take 1 tablet by mouth daily. Triple Action Joint Health    . Cyanocobalamin (VITAMIN B-12) 5000 MCG TBDP Take 5,000 mcg by mouth daily.    Marland Kitchen docusate sodium (COLACE) 100 MG capsule Take 200 mg by mouth daily as needed (constipation.).     Marland Kitchen esomeprazole (NEXIUM) 20 MG capsule Take 20 mg by mouth daily.     Marland Kitchen levothyroxine (SYNTHROID, LEVOTHROID) 75 MCG tablet Take 75 mcg by mouth daily before breakfast.     . Menthol-Methyl Salicylate (SALONPAS PAIN RELIEF PATCH) PTCH Place 1 patch onto the skin daily as needed (pain.).    Marland Kitchen Multiple Vitamins-Minerals (ANTIOXIDANT PO) Take 1 tablet by mouth daily at 6 (six) AM. MitoQ Eye (MitoQ w/Lutein, Zeaxanthin, Bilberry and Maritime [employer])    . Multiple Vitamins-Minerals (PRESERVISION AREDS PO) Take 1 tablet by mouth daily.     . Omega-3 Fatty Acids (FISH OIL) 1200 MG CAPS Take  1,200 mg by mouth daily.    Vladimir Faster Glycol-Propyl Glycol (SYSTANE OP) Place 1 drop into both eyes in the morning and at bedtime.     Vladimir Faster Glycol-Propyl Glycol (SYSTANE) 0.4-0.3 % GEL ophthalmic gel Place 1 application into both eyes daily.      No facility-administered medications prior to visit.     Allergies:   Penicillins   Social History   Socioeconomic History  . Marital status: Widowed    Spouse name: Not on file  . Number of children: 3  . Years of education: Not on file  . Highest education level: Associate degree: occupational, Hotel manager, or vocational program  Occupational History  . 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 Smoking status: Never Smoker  . Smokeless tobacco: Never Used  Vaping Use  . Vaping Use: Never used  Substance and Sexual Activity  . Alcohol use: Never  . Drug use: Never  . Sexual activity: Not on file  Other Topics Concern  . Not  on file  Social History Narrative  . Not on file   Social Determinants of Health   Financial Resource Strain:   . Difficulty of Paying Living Expenses:   Food Insecurity:   . Worried About Charity fundraiser in the Last Year:   . Arboriculturist in the Last Year:   Transportation Needs:   . Film/video editor (Medical):   Marland Kitchen Lack of Transportation (Non-Medical):   Physical Activity:   . Days of Exercise per Week:   . Minutes of Exercise per Session:   Stress:   . Feeling of Stress :   Social Connections:   . Frequency of Communication with Friends and Family:   . Frequency of Social Gatherings with Friends and Family:   . Attends Religious Services:   . Active Member of Clubs or Organizations:   . Attends Archivist Meetings:   Marland Kitchen Marital Status:      Family History:  The patient's family history includes Healthy in her sister and son; Heart failure in her father.     ROS:   Please see the history of present illness.    ROS All other systems reviewed and are negative.   PHYSICAL EXAM:   VS:  BP 108/88   Pulse 77   Ht 5\' 4"  (1.626 m)   Wt 272 lb (123.4 kg)   SpO2 93%   BMI 46.69 kg/m    GEN: Well nourished, well developed, in no acute distress, obese HEENT: normal Neck: no JVD or masses Cardiac: irreg irreg; no murmurs, rubs, or gallops,no edema  Respiratory:  clear to auscultation bilaterally, normal work of breathing GI: soft, nontender, nondistended, + BS MS: no deformity or atrophy Skin: warm and dry, no rash.  Groin sites clear without hematoma or ecchymosis  Neuro:  Alert and Oriented x 3, Strength and sensation are intact Psych: euthymic mood, full affect   Wt Readings from Last 3 Encounters:  08/11/19 272 lb (123.4 kg)  08/04/19 275 lb 3.2 oz (124.8 kg)  07/30/19 269 lb 1.6 oz (122.1 kg)      Studies/Labs Reviewed:   EKG:  EKG is ordered today.  The ekg ordered today demonstrates afib with CVR. HR 77  Recent Labs: 07/30/2019: ALT 7;  B Natriuretic Peptide 209.1 08/04/2019: BUN 12; Creatinine, Ser 0.84; Hemoglobin 14.7; Magnesium 1.9; Platelets 111; Potassium 3.9; Sodium 141   Lipid Panel No results found for: CHOL,  TRIG, HDL, CHOLHDL, VLDL, LDLCALC, LDLDIRECT  Additional studies/ records that were reviewed today include:  TAVR OPERATIVE NOTE   Date of Procedure:08/03/2019  Preoperative Diagnosis:Severe Aortic Stenosis   Postoperative Diagnosis:Same   Procedure:   Transcatheter Aortic Valve Replacement - PercutaneousRightTransfemoral Approach Edwards Sapien 3 Ultra THV (size 26mm, model # 9750TFX, serial # [id number])  Co-Surgeons:Clarence H. Cornelius Moraswen, MD and Verne Carrowhristopher McAlhany, MD  Anesthesiologist:William Aleene DavidsonE. Fitzgerald, MD  Echocardiographer:Peter Eden EmmsNishan, MD  Pre-operative Echo Findings: ? Severe aortic stenosis ? Normalleft ventricular systolic function  Post-operative Echo Findings: ? Noparavalvular leak ? Normalleft ventricular systolic function  _____________   Echo 08/04/19: IMPRESSIONS 1. Left ventricular ejection fraction, by estimation, is 60 to 65%. The  left ventricle has normal function. The left ventricle has no regional  wall motion abnormalities. Left ventricular diastolic parameters are  indeterminate.  2. Right ventricular systolic function is normal. The right ventricular  size is normal.  3. The mitral valve is normal in structure. No evidence of mitral valve  regurgitation. No evidence of mitral stenosis.  4. Post TAVR with 26 mm Sapien 3 valve No PVL peak velocity 2.2 m/sec  peak gradient 19 mmHg mean 10 mmHg AVA 2.3 cm2 DVI 0.54 . The aortic valve  has been repaired/replaced. Aortic valve regurgitation is not visualized.  No aortic stenosis is present.  There is a 26 mm Edwards Sapien prosthetic (TAVR) valve present in the  aortic position. Procedure  Date: 08/03/2019.  5. The inferior vena cava is normal in size with greater than 50%  respiratory variability, suggesting right atrial pressure of 3 mmHg.   ASSESSMENT & PLAN:   Severe AS s/p TAVR:Groin sites are stable.  ECG with A. fib with controlled ventricular response.  No H AVB.SBE prophylaxis discussed; I have RX'd Keflex due to a nonanaphylactic PCN allergy. Continue on aspirin and Eliquis. Plan to see patient back in 1 month for follow-up and echocardiogram.  Persistent atrial fibrillation: found to be in newly diagnosed afib at the time of her cath on 5/10. She was started on Eliquis at that time. She has remained in afib since that time. Plan for DCCV once she has been back on Eliqus for at least 3 weeks.   HTN:  BP well controlled today.  No changes made  Chronic diastolic CHF: Appears euvolemic.  No changes made  Incidental findings: pre TAVR CT showed mild right paratracheal lymphadenopathy, requiring no follow-up unless the patient has a history of malignancy. Subcentimeter hypodense left liver lesion is too small to characterize and requires no follow-up unless the patient has risk factors for liver malignancy.   This was discussed with the patient today and we feel this requires no further follow-up..  I told her I would fax this over to her PCP, but the patient prefers I not do this as she does not want to pursue any further testing.  I provided her a copy of the CT scan results for her review.  Medication Adjustments/Labs and Tests Ordered: Current medicines are reviewed at length with the patient today.  Concerns regarding medicines are outlined above.  Medication changes, Labs and Tests ordered today are listed in the Patient Instructions below. Patient Instructions  Medication Instructions:  1) Your provider discussed the importance of taking an antibiotic prior to all dental visits to prevent damage to the heart valves from infection. You were given a prescription for  KEFLEX 2,000 mg to take one hour prior to any dental appointment.  *If you need a refill  on your cardiac medications before your next appointment, please call your pharmacy*   Follow-Up: Please keep your follow-up appointments as scheduled!    Signed, Cline Crock, PA-C  08/11/2019 1:33 PM    [facility] [address] [place], [place], [place]  [address] Phone: [phone]; Fax: [phone]

## 2019-08-11 ENCOUNTER — Other Ambulatory Visit: Payer: Self-pay

## 2019-08-11 ENCOUNTER — Ambulatory Visit: Payer: Medicare PPO | Admitting: Physician Assistant

## 2019-08-11 VITALS — BP 108/88 | HR 77 | Ht 64.0 in | Wt 272.0 lb

## 2019-08-11 DIAGNOSIS — I4819 Other persistent atrial fibrillation: Secondary | ICD-10-CM | POA: Diagnosis not present

## 2019-08-11 DIAGNOSIS — Z952 Presence of prosthetic heart valve: Secondary | ICD-10-CM | POA: Diagnosis not present

## 2019-08-11 DIAGNOSIS — I10 Essential (primary) hypertension: Secondary | ICD-10-CM | POA: Diagnosis not present

## 2019-08-11 DIAGNOSIS — I5032 Chronic diastolic (congestive) heart failure: Secondary | ICD-10-CM | POA: Diagnosis not present

## 2019-08-11 MED ORDER — CEPHALEXIN 500 MG PO CAPS: ORAL_CAPSULE | ORAL | 11 refills | Status: DC

## 2019-08-11 NOTE — Patient Instructions (Signed)
Medication Instructions:  1) Your provider discussed the importance of taking an antibiotic prior to all dental visits to prevent damage to the heart valves from infection. You were given a prescription for KEFLEX 2,000 mg to take one hour prior to any dental appointment.  *If you need a refill on your cardiac medications before your next appointment, please call your pharmacy*   Follow-Up: Please keep your follow-up appointments as scheduled!

## 2019-08-18 ENCOUNTER — Ambulatory Visit: Payer: Medicare PPO | Admitting: Cardiology

## 2019-09-02 NOTE — Progress Notes (Deleted)
Assessment/Plan:   1.  Parkinsons Disease  -***Continue carbidopa/levodopa 25/100, 2 tablets at 8 AM, 2 tablets at noon, 1 tablet at 4 PM, 1 tablet at 8 PM 2.  Urinary frequency  -Has followed with [facility] 3.  Aortic stenosis  -Following with cardiology and s/p TAVR in 07/2019  -Patient now on Eliquis.   Subjective:   Cindy Richmond was seen today in follow up for Parkinsons disease.  My previous records were reviewed prior to todays visit as well as outside records available to me. Pt denies falls.  Pt denies lightheadedness, near syncope.  No hallucinations.  Mood has been good.  Medical records reviewed since our last visit.  Was in the hospital in June for TAVR  Current prescribed movement disorder medications: ***Carbidopa/levodopa 25/100, 2 tablets at 8 AM/2 tablets at noon/1 tablet at 4 PM/1 tablet at 8 PM   PREVIOUS MEDICATIONS:  levodopa (declines agonist)  ALLERGIES:   Allergies  Allergen Reactions  . Penicillins Other (See Comments)    unknown Did it involve swelling of the face/tongue/throat, SOB, or low BP? Unknown Did it involve sudden or severe rash/hives, skin peeling, or any reaction on the inside of your mouth or nose? Unknown Did you need to seek medical attention at a hospital or doctor's office? Unknown When did it last happen?newborn allergy If all above answers are "NO", may proceed with cephalosporin use.     CURRENT MEDICATIONS:  Outpatient Encounter Medications as of 09/07/2019  Medication Sig  . apixaban (ELIQUIS) 5 MG TABS tablet Take 1 tablet (5 mg total) by mouth 2 (two) times daily.  Marland Kitchen aspirin 81 MG chewable tablet Chew 1 tablet (81 mg total) by mouth daily.  . bisoprolol-hydrochlorothiazide (ZIAC) 5-6.25 MG tablet Take 1 tablet by mouth daily.  . brimonidine (ALPHAGAN) 0.15 % ophthalmic solution Place 1 drop into the left eye in the morning and at bedtime.   . carbidopa-levodopa (SINEMET IR) 25-100 MG tablet 2 TABLETS AT 8AM/2  TABLETS AT NOON/1 TABLET AT 4 PM/1 TABLET AT 8PM  . cephALEXin (KEFLEX) 500 MG capsule Take 2,000 mg (4 capsules) one hour to all dental visits.  . cholecalciferol (VITAMIN D) 25 MCG (1000 UNIT) tablet Take 1,000 Units by mouth daily.  . Collagen-Boron-Hyaluronic Acid (CVS JOINT HEALTH TRIPLE ACTION PO) Take 1 tablet by mouth daily. Triple Action Joint Health  . Cyanocobalamin (VITAMIN B-12) 5000 MCG TBDP Take 5,000 mcg by mouth daily.  Marland Kitchen docusate sodium (COLACE) 100 MG capsule Take 200 mg by mouth daily as needed (constipation.).   Marland Kitchen esomeprazole (NEXIUM) 20 MG capsule Take 20 mg by mouth daily.   Marland Kitchen levothyroxine (SYNTHROID, LEVOTHROID) 75 MCG tablet Take 75 mcg by mouth daily before breakfast.   . Menthol-Methyl Salicylate (SALONPAS PAIN RELIEF PATCH) PTCH Place 1 patch onto the skin daily as needed (pain.).  Marland Kitchen Multiple Vitamins-Minerals (ANTIOXIDANT PO) Take 1 tablet by mouth daily at 6 (six) AM. MitoQ Eye (MitoQ w/Lutein, Zeaxanthin, Bilberry and Maritime [employer])  . Multiple Vitamins-Minerals (PRESERVISION AREDS PO) Take 1 tablet by mouth daily.   . Omega-3 Fatty Acids (FISH OIL) 1200 MG CAPS Take 1,200 mg by mouth daily.  Bertram Gala Glycol-Propyl Glycol (SYSTANE OP) Place 1 drop into both eyes in the morning and at bedtime.   Bertram Gala Glycol-Propyl Glycol (SYSTANE) 0.4-0.3 % GEL ophthalmic gel Place 1 application into both eyes daily.    No facility-administered encounter medications on file as of 09/07/2019.    Objective:  PHYSICAL EXAMINATION:    VITALS:  There were no vitals filed for this visit.  GEN:  The patient appears stated age and is in NAD. HEENT:  Normocephalic, atraumatic.  The mucous membranes are moist. The superficial temporal arteries are without ropiness or tenderness. CV:  RRR Lungs:  CTAB Neck/HEME:  There are no carotid bruits bilaterally.  Neurological examination:  Orientation: The patient is alert and oriented x3. Cranial nerves: There is  good facial symmetry with*** facial hypomimia. The speech is fluent and clear. Soft palate rises symmetrically and there is no tongue deviation. Hearing is intact to conversational tone. Sensation: Sensation is intact to light touch throughout Motor: Strength is at least antigravity x4.  Movement examination: Tone: There is ***tone in the *** Abnormal movements: *** Coordination:  There is *** decremation with RAM's, *** Gait and Station: The patient has *** difficulty arising out of a deep-seated chair without the use of the hands. The patient's stride length is ***.  The patient has a *** pull test.     I have reviewed and interpreted the following labs independently    Chemistry      Component Value Date/Time   NA 141 08/04/2019 0353   NA 143 07/01/2019 1553   K 3.9 08/04/2019 0353   CL 107 08/04/2019 0353   CO2 23 08/04/2019 0353   BUN 12 08/04/2019 0353   BUN 13 07/01/2019 1553   CREATININE 0.84 08/04/2019 0353      Component Value Date/Time   CALCIUM 8.8 (L) 08/04/2019 0353   ALKPHOS 71 07/30/2019 0934   AST 14 (L) 07/30/2019 0934   ALT 7 07/30/2019 0934   BILITOT 0.8 07/30/2019 0934       Lab Results  Component Value Date   WBC 9.8 08/04/2019   HGB 14.7 08/04/2019   HCT 47.4 (H) 08/04/2019   MCV 91.7 08/04/2019   PLT 111 (L) 08/04/2019    Lab Results  Component Value Date   TSH 4.21 10/06/2006     Total time spent on today's visit was ***30 minutes, including both face-to-face time and nonface-to-face time.  Time included that spent on review of records (prior notes available to me/labs/imaging if pertinent), discussing treatment and goals, answering patient's questions and coordinating care.  Cc:  Mila Palmer, MD

## 2019-09-07 ENCOUNTER — Ambulatory Visit: Payer: Medicare PPO | Admitting: Neurology

## 2019-09-08 NOTE — Progress Notes (Signed)
[facility]   MULTIDISCIPLINARY HEART VALVE CLINIC                                       Cardiology Office Note    Date:  09/10/2019   ID:  Cindy, Richmond 24-Dec-1950, MRN [id number]  PCP:  Mila Palmer, MD  Cardiologist: [name], MD / Dr. Clifton James & Dr. Cornelius Moras (TAVR)  CC: 1 month s/p TAVR  History of Present Illness:  Cindy Richmond is a 69 y.o. female with a history of GERD, HTN, HLD, hypothyroidism, Parkinson's disease, atrial fibrillation (new at time of cath) on Eliquisand severe aortic stenosis s/p TAVR (08/03/19) who presents to clinic for follow up.   Patient states that she has known of presence of a heart murmur for several years. She denies any known history of rheumatic fever or rheumatic heart disease. She has been followed by Dr. Jens Som with known history of aortic stenosis that has gradually progressed on follow-up echocardiography. The patient states that over the last 2 to 3 years she has developed worsening exertional shortness of breath and fatigue. She now gets short of breath with very low level activity and she feels as though she just cannot do much of anything without getting tired or winded. She was recently noted to be in atrial fibrillation having previously always been in sinus rhythm. She is now on Eliquis for long-term anticoagulation. Follow-up echocardiogram performed May 11, 2019 revealed normal left ventricular systolic function with ejection fraction estimated 60 to 65%. There was severe aortic stenosis. Peak velocity across aortic valve measured 4.0 m/s corresponding to mean transvalvular gradient estimated 42 mmHg and aortic valve area calculated only 0.43 cm. The DVI was notably quite low at 0.17. The patient was referred to the multidisciplinary heart valve clinic and underwent diagnostic cardiac catheterization on Jul 05, 2019. Catheterization revealed normal coronary artery anatomy with no significant coronary artery disease.  Catheterization confirmed the presence of severe aortic stenosis with peak to peak and mean transvalvular gradients measured 54 and 28.6 mmHg, corresponding to aortic valve area calculated 0.9 cm. Pulmonary artery pressures were mildly elevated.  She was evaluated by the multidisciplinary valve team and underwent successful TAVR with a 26 mm Edwards Sapien 3 Ultra THV via the TF approach on 08/03/19. Post operative echo showed EF 60%, no PVL and mean gradient 10 mm Hg. She was discharged on home Eliquis with the addition of a baby aspirin x 6 months.   Today she presents to clinic for follow up. No CP or SOB. No LE edema, orthopnea or PND. No dizziness or syncope. No blood in stool or urine. No palpitations.  Feels so much better since TAVR with more energy. Mostly limited by leg weakness from Parkinsons.    Past Medical History:  Diagnosis Date  . Atrial fibrillation, persistent (HCC)   . Dyspnea    with head low, sleep apnea  . Dysrhythmia    afib  . GERD (gastroesophageal reflux disease)   . H/O hiatal hernia   . Hematuria   . History of kidney stones   . HOH (hard of hearing)    sightly, bilaterally  . Hyperlipidemia   . Hypertension   . Hypothyroidism   . Parkinson disease (HCC)   . S/P TAVR (transcatheter aortic valve replacement) 08/03/2019   s/p TAVR with 26 mm Edwards S3U via the TF approach by  Drs Aundra DubinMcAlhnay & Cornelius Moraswen  . Severe aortic stenosis   . Sleep apnea     Past Surgical History:  Procedure Laterality Date  . ABDOMINAL HYSTERECTOMY     lso  . CYSTOSCOPY W/ RETROGRADES  02/28/2012   Procedure: CYSTOSCOPY WITH RETROGRADE PYELOGRAM;  Surgeon: Sebastian Acheheodore Manny, MD;  [facility] [place];  Service: Urology;  Laterality: Right;  . CYSTOSCOPY WITH STENT PLACEMENT  02/28/2012   Procedure: CYSTOSCOPY WITH STENT PLACEMENT;  Surgeon: Sebastian Acheheodore Manny, MD;  [facility] [place];  Service: Urology;  Laterality: Left;  .  CYSTOSCOPY/RETROGRADE/URETEROSCOPY/STONE EXTRACTION WITH BASKET  02/28/2012   Procedure: CYSTOSCOPY/RETROGRADE/URETEROSCOPY/STONE EXTRACTION WITH BASKET;  Surgeon: Sebastian Acheheodore Manny, MD;  [facility] [place];  Service: Urology;  Laterality: Left;  . HOLMIUM LASER APPLICATION  02/28/2012   Procedure: HOLMIUM LASER APPLICATION;  Surgeon: Sebastian Acheheodore Manny, MD;  [facility] [place];  Service: Urology;  Laterality: Left;  . INTRAOPERATIVE TRANSTHORACIC ECHOCARDIOGRAM  08/03/2019   Procedure: Intraoperative Transthoracic Echocardiogram;  Surgeon: Kathleene HazelMcAlhany, Christopher D, MD;  [facility];  Service: Open Heart Surgery;;  . KNEE ARTHROSCOPY     right  . REVERSE SHOULDER ARTHROPLASTY Left 07/22/2018   Procedure: REVERSE SHOULDER ARTHROPLASTY;  Surgeon: Bjorn PippinVarkey, Dax T, MD;  Location: [facility];  Service: Orthopedics;  Laterality: Left;  . RIGHT/LEFT HEART CATH AND CORONARY ANGIOGRAPHY N/A 07/05/2019   Procedure: RIGHT/LEFT HEART CATH AND CORONARY ANGIOGRAPHY;  Surgeon: Kathleene HazelMcAlhany, Christopher D, MD;  Location: [facility] INVASIVE CV LAB;  Service: Cardiovascular;  Laterality: N/A;  . TONSILLECTOMY    . TRANSCATHETER AORTIC VALVE REPLACEMENT, TRANSFEMORAL N/A 08/03/2019   Procedure: TRANSCATHETER AORTIC VALVE REPLACEMENT, TRANSFEMORAL;  Surgeon: Kathleene HazelMcAlhany, Christopher D, MD;  Location: [facility];  Service: Open Heart Surgery;  Laterality: N/A;  . VEIN LIGATION      Current Medications: Outpatient Medications Prior to Visit  Medication Sig Dispense Refill  . bisoprolol-hydrochlorothiazide (ZIAC) 5-6.25 MG tablet Take 1 tablet by mouth daily.    . brimonidine (ALPHAGAN) 0.15 % ophthalmic solution Place 1 drop into the left eye in the morning and at bedtime.     . carbidopa-levodopa (SINEMET IR) 25-100 MG tablet 2 TABLETS AT 8AM/2 TABLETS AT NOON/1 TABLET AT 4 PM/1 TABLET AT 8PM 540 tablet 1  . cephALEXin (KEFLEX) 500 MG capsule Take 2,000 mg (4 capsules) one hour to all dental visits. 8 capsule 11  .  cholecalciferol (VITAMIN D) 25 MCG (1000 UNIT) tablet Take 1,000 Units by mouth daily.    . Collagen-Boron-Hyaluronic Acid ([facility] JOINT HEALTH TRIPLE ACTION PO) Take 1 tablet by mouth daily. Triple Action Joint Health    . Cyanocobalamin (VITAMIN B-12) 5000 MCG TBDP Take 5,000 mcg by mouth daily.    Marland Kitchen. docusate sodium (COLACE) 100 MG capsule Take 200 mg by mouth daily as needed (constipation.).     Marland Kitchen. esomeprazole (NEXIUM) 20 MG capsule Take 20 mg by mouth daily.     Marland Kitchen. levothyroxine (SYNTHROID, LEVOTHROID) 75 MCG tablet Take 75 mcg by mouth daily before breakfast.     . Menthol-Methyl Salicylate (SALONPAS PAIN RELIEF PATCH) PTCH Place 1 patch onto the skin daily as needed (pain.).    Marland Kitchen. Multiple Vitamins-Minerals (ANTIOXIDANT PO) Take 1 tablet by mouth daily at 6 (six) AM. MitoQ Eye (MitoQ w/Lutein, Zeaxanthin, Bilberry and Maritime [employer]Pine Bark Extract)    . Multiple Vitamins-Minerals (PRESERVISION AREDS PO) Take 1 tablet by mouth daily.     . Omega-3 Fatty Acids (FISH OIL) 1200 MG CAPS Take 1,200 mg  by mouth daily.    Bertram Gala Glycol-Propyl Glycol (SYSTANE OP) Place 1 drop into both eyes in the morning and at bedtime.     Marland Kitchen apixaban (ELIQUIS) 5 MG TABS tablet Take 1 tablet (5 mg total) by mouth 2 (two) times daily. 60 tablet 6  . aspirin 81 MG chewable tablet Chew 1 tablet (81 mg total) by mouth daily.    Bertram Gala Glycol-Propyl Glycol (SYSTANE) 0.4-0.3 % GEL ophthalmic gel Place 1 application into both eyes daily.  (Patient not taking: Reported on 09/09/2019)     No facility-administered medications prior to visit.     Allergies:   Penicillins   Social History   Socioeconomic History  . Marital status: Widowed    Spouse name: Not on file  . Number of children: 3  . Years of education: Not on file  . Highest education level: Associate degree: occupational, Scientist, product/process development, or vocational program  Occupational History  . 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 Smoking  status: Never Smoker  . Smokeless tobacco: Never Used  Vaping Use  . Vaping Use: Never used  Substance and Sexual Activity  . Alcohol use: Never  . Drug use: Never  . Sexual activity: Not on file  Other Topics Concern  . Not on file  Social History Narrative  . Not on file   Social Determinants of Health   Financial Resource Strain:   . Difficulty of Paying Living Expenses:   Food Insecurity:   . Worried About Programme researcher, broadcasting/film/video in the Last Year:   . Barista in the Last Year:   Transportation Needs:   . Freight forwarder (Medical):   Marland Kitchen Lack of Transportation (Non-Medical):   Physical Activity:   . Days of Exercise per Week:   . Minutes of Exercise per Session:   Stress:   . Feeling of Stress :   Social Connections:   . Frequency of Communication with Friends and Family:   . Frequency of Social Gatherings with Friends and Family:   . Attends Religious Services:   . Active Member of Clubs or Organizations:   . Attends Banker Meetings:   Marland Kitchen Marital Status:      Family History:  The patient's family history includes Healthy in her sister and son; Heart failure in her father.     ROS:   Please see the history of present illness.    ROS All other systems reviewed and are negative.   PHYSICAL EXAM:   VS:  BP 130/90   Pulse 85   Ht 5\' 5"  (1.651 m)   Wt 270 lb 9.6 oz (122.7 kg)   SpO2 95%   BMI 45.03 kg/m    GEN: Well nourished, well developed, in no acute distress, obese HEENT: normal Neck: no JVD or masses Cardiac: irreg irreg; no murmurs, rubs, or gallops,no edema  Respiratory:  clear to auscultation bilaterally, normal work of breathing GI: soft, nontender, nondistended, + BS MS: no deformity or atrophy Skin: warm and dry, no rash Neuro:  Alert and Oriented x 3, Strength and sensation are intact Psych: euthymic mood, full affect   Wt Readings from Last 3 Encounters:  09/09/19 270 lb 9.6 oz (122.7 kg)  08/11/19 272 lb (123.4 kg)   08/04/19 275 lb 3.2 oz (124.8 kg)      Studies/Labs Reviewed:   EKG:  EKG is NOT ordered today.    Recent Labs: 07/30/2019: ALT  7; B Natriuretic Peptide 209.1 08/04/2019: BUN 12; Creatinine, Ser 0.84; Hemoglobin 14.7; Magnesium 1.9; Platelets 111; Potassium 3.9; Sodium 141   Lipid Panel No results found for: CHOL, TRIG, HDL, CHOLHDL, VLDL, LDLCALC, LDLDIRECT  Additional studies/ records that were reviewed today include:  TAVR OPERATIVE NOTE   Date of Procedure:08/03/2019  Preoperative Diagnosis:Severe Aortic Stenosis   Postoperative Diagnosis:Same   Procedure:   Transcatheter Aortic Valve Replacement - PercutaneousRightTransfemoral Approach Edwards Sapien 3 Ultra THV (size 26mm, model # 9750TFX, serial # [id number])  Co-Surgeons:Clarence H. Cornelius Moras, MD and Verne Carrow, MD  Anesthesiologist:William Aleene Davidson, MD  Echocardiographer:Peter Eden Emms, MD  Pre-operative Echo Findings: ? Severe aortic stenosis ? Normalleft ventricular systolic function  Post-operative Echo Findings: ? Noparavalvular leak ? Normalleft ventricular systolic function  _____________   Echo 08/04/19: IMPRESSIONS 1. Left ventricular ejection fraction, by estimation, is 60 to 65%. The  left ventricle has normal function. The left ventricle has no regional  wall motion abnormalities. Left ventricular diastolic parameters are  indeterminate.  2. Right ventricular systolic function is normal. The right ventricular  size is normal.  3. The mitral valve is normal in structure. No evidence of mitral valve  regurgitation. No evidence of mitral stenosis.  4. Post TAVR with 26 mm Sapien 3 valve No PVL peak velocity 2.2 m/sec  peak gradient 19 mmHg mean 10 mmHg AVA 2.3 cm2 DVI 0.54 . The aortic valve  has been repaired/replaced. Aortic valve regurgitation is not  visualized.  No aortic stenosis is present.  There is a 26 mm Edwards Sapien prosthetic (TAVR) valve present in the  aortic position. Procedure Date: 08/03/2019.  5. The inferior vena cava is normal in size with greater than 50%  respiratory variability, suggesting right atrial pressure of 3 mmHg.   _________________  Echo 09/09/19 IMPRESSIONS  1. Left ventricular ejection fraction, by estimation, is 65 to 70%. The left ventricle has normal function. The left ventricle has no regional wall motion abnormalities. There is mild left ventricular hypertrophy. Left ventricular diastolic function  could not be evaluated.  2. Right ventricular systolic function is low normal. The right ventricular size is normal. There is normal pulmonary artery systolic pressure.  3. The aortic valve has been repaired/replaced. Aortic valve regurgitation is not visualized. There is a 26 mm Edwards Sapien prosthetic (TAVR) valve present in the aortic position. Echo findings are consistent with normal structure and function of the aortic valve prosthesis. Aortic valve area, by VTI measures 1.49 cm. Aortic valve mean gradient measures 6.5 mmHg. Aortic valve Vmax measures 1.96 m/s. Peak gradient 15 mmHg.  4. The inferior vena cava is normal in size with greater than 50% respiratory variability, suggesting right atrial pressure of 3 mmHg.  5. The mitral valve is grossly normal. Trivial mitral valve regurgitation.  Comparison(s): Changes from prior study are noted. 08/04/2019: LVEF 60-65%, 26 mm ES3 valve, mean gradient 10 mmHg, peak gradient 19 mmHg.    ASSESSMENT & PLAN:   Severe AS s/p TAVR:Echo today shows EF 60%, normally functioning TAVR with a mean gradient of 6.5 mmHg and no PVL. She has NYHA class I symptoms. She is mostly limited by leg weakness from her Parkinsons. I have RX'd Keflex due to a nonanaphylactic PCN allergy. Continue on aspirin and Eliquis. She can stop aspirin after 6 months of therapy (01/2020).  I will see her back in 1 year for follow up with echo.  Persistent atrial fibrillation: I offered her a DCCV given recently diagnosed afib but she says  she feels great and would like to hold off. Continue rate control and Eliquis  HTN: Bp initially elevated, but better on my personal recheck.   Chronic diastolic CHF: Appears euvolemic.  No changes made   Medication Adjustments/Labs and Tests Ordered: Current medicines are reviewed at length with the patient today.  Concerns regarding medicines are outlined above.  Medication changes, Labs and Tests ordered today are listed in the Patient Instructions below. Patient Instructions  Medication Instructions:  Your physician has recommended you make the following change in your medication:  1.) stop aspirin after February 02, 2020.  *If you need a refill on your cardiac medications before your next appointment, please call your pharmacy*   Lab Work: none If you have labs (blood work) drawn today and your tests are completely normal, you will receive your results only by: Marland Kitchen MyChart Message (if you have MyChart) OR . A paper copy in the mail If you have any lab test that is abnormal or we need to change your treatment, we will call you to review the results.   Testing/Procedures: none   Follow-Up: At [facility], you and your health needs are our priority.  As part of our continuing mission to provide you with exceptional heart care, we have created designated Provider Care Teams.  These Care Teams include your primary Cardiologist (physician) and Advanced Practice Providers (APPs -  Physician Assistants and Nurse Practitioners) who all work together to provide you with the care you need, when you need it.  Your next appointment:   3 month(s)  The format for your next appointment:   In Person  Provider:   You may see [name], MD or one of the following Advanced Practice Providers on your designated Care Team:    Corine Shelter, PA-C  [place], [place]  Edd Fabian, FNP    Other Instructions      Signed, Cline Crock, Cordelia Poche  09/10/2019 11:52 AM    [facility] [address] [place], [place], [place]  [address] Phone: [phone]; Fax: [phone]

## 2019-09-09 ENCOUNTER — Ambulatory Visit (HOSPITAL_COMMUNITY): Payer: Medicare PPO | Attending: Internal Medicine

## 2019-09-09 ENCOUNTER — Encounter: Payer: Self-pay | Admitting: Physician Assistant

## 2019-09-09 ENCOUNTER — Ambulatory Visit (INDEPENDENT_AMBULATORY_CARE_PROVIDER_SITE_OTHER): Payer: Medicare PPO | Admitting: Physician Assistant

## 2019-09-09 ENCOUNTER — Other Ambulatory Visit: Payer: Self-pay

## 2019-09-09 VITALS — BP 130/90 | HR 85 | Ht 65.0 in | Wt 270.6 lb

## 2019-09-09 DIAGNOSIS — Z952 Presence of prosthetic heart valve: Secondary | ICD-10-CM

## 2019-09-09 DIAGNOSIS — I4819 Other persistent atrial fibrillation: Secondary | ICD-10-CM | POA: Diagnosis not present

## 2019-09-09 DIAGNOSIS — I10 Essential (primary) hypertension: Secondary | ICD-10-CM

## 2019-09-09 DIAGNOSIS — I5032 Chronic diastolic (congestive) heart failure: Secondary | ICD-10-CM

## 2019-09-09 LAB — ECHOCARDIOGRAM COMPLETE
AR max vel: 1.51 cm2
AV Area VTI: 1.49 cm2
AV Area mean vel: 1.58 cm2
AV Mean grad: 6.5 mmHg
AV Peak grad: 15.3 mmHg
Ao pk vel: 1.96 m/s
Area-P 1/2: 3.25 cm2
S' Lateral: 1.7 cm

## 2019-09-09 MED ORDER — ASPIRIN 81 MG PO CHEW: 81.0000 mg | CHEWABLE_TABLET | Freq: Every day | ORAL | Status: AC

## 2019-09-09 MED ORDER — APIXABAN 5 MG PO TABS: 5.0000 mg | ORAL_TABLET | Freq: Two times a day (BID) | ORAL | 6 refills | Status: DC

## 2019-09-09 NOTE — Patient Instructions (Signed)
Medication Instructions:  Your physician has recommended you make the following change in your medication:  1.) stop aspirin after February 02, 2020.  *If you need a refill on your cardiac medications before your next appointment, please call your pharmacy*   Lab Work: none If you have labs (blood work) drawn today and your tests are completely normal, you will receive your results only by: Marland Kitchen MyChart Message (if you have MyChart) OR . A paper copy in the mail If you have any lab test that is abnormal or we need to change your treatment, we will call you to review the results.   Testing/Procedures: none   Follow-Up: At [facility], you and your health needs are our priority.  As part of our continuing mission to provide you with exceptional heart care, we have created designated Provider Care Teams.  These Care Teams include your primary Cardiologist (physician) and Advanced Practice Providers (APPs -  Physician Assistants and Nurse Practitioners) who all work together to provide you with the care you need, when you need it.  Your next appointment:   3 month(s)  The format for your next appointment:   In Person  Provider:   You may see [name], MD or one of the following Advanced Practice Providers on your designated Care Team:    Corine Shelter, PA-C  [place], [place]  Edd Fabian, [place]    Other Instructions

## 2019-10-26 DIAGNOSIS — H43821 Vitreomacular adhesion, right eye: Secondary | ICD-10-CM | POA: Diagnosis not present

## 2019-10-26 DIAGNOSIS — H35033 Hypertensive retinopathy, bilateral: Secondary | ICD-10-CM | POA: Diagnosis not present

## 2019-10-26 DIAGNOSIS — H2513 Age-related nuclear cataract, bilateral: Secondary | ICD-10-CM | POA: Diagnosis not present

## 2019-10-26 DIAGNOSIS — H34232 Retinal artery branch occlusion, left eye: Secondary | ICD-10-CM | POA: Diagnosis not present

## 2019-11-06 ENCOUNTER — Other Ambulatory Visit: Payer: Self-pay | Admitting: Neurology

## 2019-11-08 NOTE — Telephone Encounter (Signed)
Patient needs to be seen.

## 2019-11-12 ENCOUNTER — Telehealth: Payer: Self-pay | Admitting: *Deleted

## 2019-11-12 NOTE — Telephone Encounter (Signed)
Can you see ifyou can get her in with an APp at [facility] on Dr. Ludwig Clarks team? She would like to discuss DCCV and will need a repeat ECG. She should keep her apt with [facility] on 10/15. Thanks!  KT   Patient Message Open   11/11/2019 [facility], Generic Provider      Conversation: Atrial Fibrillation  (Newest Message First) November 12, 2019 Janetta Hora, PA-C to Cindy, Richmond P      9:04 AM I am glad you are doing so much better after your valve! Let me send a message to the office to see if they can get you in any sooner to discuss cardioversion. We will get back to you with a sooner apt.  KT  Last read by [name] at 11:48 AM on 11/12/2019.     8:03 AM You routed this conversation to Janetta Hora, PA-C  November 11, 2019 Santiago Bur P to Janetta Hora, PA-C      5:27 PM I am concerned that my AFib is getting worse.  I feel my heart shaking a lot and I feel kind of strange when that is happening.  I have regretted not staying with the program.  After my aortic valve replacement I guess I just felt so good after feeling so bad for so long. Lately though I have come to realize that my AFib is getting worse.  I feel fatigued and sometimes a little strange.  My next appointment with Dr Jens Som is October 13.  I am uncomfortable putting this off until then.  Can you advise? [name] Dorian Pod with patient and scheduled appt as requested to discuss possible out pt cardioversion if she is still in At Fib.  Pt is agreeable and aware of date, time, location and provider.

## 2019-11-18 ENCOUNTER — Other Ambulatory Visit: Payer: Self-pay

## 2019-11-18 ENCOUNTER — Ambulatory Visit (INDEPENDENT_AMBULATORY_CARE_PROVIDER_SITE_OTHER): Payer: Medicare PPO | Admitting: Physician Assistant

## 2019-11-18 ENCOUNTER — Encounter: Payer: Self-pay | Admitting: Physician Assistant

## 2019-11-18 VITALS — BP 134/78 | HR 82 | Ht 65.0 in | Wt 270.0 lb

## 2019-11-18 DIAGNOSIS — Z01810 Encounter for preprocedural cardiovascular examination: Secondary | ICD-10-CM

## 2019-11-18 DIAGNOSIS — E039 Hypothyroidism, unspecified: Secondary | ICD-10-CM

## 2019-11-18 DIAGNOSIS — Z952 Presence of prosthetic heart valve: Secondary | ICD-10-CM | POA: Diagnosis not present

## 2019-11-18 DIAGNOSIS — E785 Hyperlipidemia, unspecified: Secondary | ICD-10-CM | POA: Diagnosis not present

## 2019-11-18 DIAGNOSIS — I4819 Other persistent atrial fibrillation: Secondary | ICD-10-CM

## 2019-11-18 DIAGNOSIS — I10 Essential (primary) hypertension: Secondary | ICD-10-CM | POA: Diagnosis not present

## 2019-11-18 NOTE — Patient Instructions (Addendum)
Medication Instructions:  Your physician recommends that you continue on your current medications as directed. Please refer to the Current Medication list given to you today.  *If you need a refill on your cardiac medications before your next appointment, please call your pharmacy*  Lab Work: Your physician recommends that you return for lab work 1 week prior to Cardioverson:   BMET  CBC  You will also need to have a COVID test. This test is performed at [address] [place], [place] [address].   If you have labs (blood work) drawn today and your tests are completely normal, you will receive your results only by: Marland Kitchen MyChart Message (if you have MyChart) OR . A paper copy in the mail If you have any lab test that is abnormal or we need to change your treatment, we will call you to review the results.  Testing/Procedures: Your physician has recommended that you have a Cardioversion (DCCV). Electrical Cardioversion uses a jolt of electricity to your heart either through paddles or wired patches attached to your chest. This is a controlled, usually prescheduled, procedure. Defibrillation is done under light anesthesia in the hospital, and you usually go home the day of the procedure. This is done to get your heart back into a normal rhythm. You are not awake for the procedure. Please see the instruction sheet given to you today.  Cardioversion is scheduled for Tuesday 11/30/2019 at 10:30 AM   Follow-Up: At [facility], you and your health needs are our priority.  As part of our continuing mission to provide you with exceptional heart care, we have created designated Provider Care Teams.  These Care Teams include your primary Cardiologist (physician) and Advanced Practice Providers (APPs -  Physician Assistants and Nurse Practitioners) who all work together to provide you with the care you need, when you need it.  Your next appointment:   Follow up 2 weeks after Cardioversion   The format  for your next appointment:   In Person  Provider:   [name], MD or Azalee Course, PA-C  Other Instructions       Dear Cindy Richmond  You are scheduled for a Cardioversion on Tuesday 11/30/2019 with Dr. [name].  Please arrive at the [facility] (Main Entrance A) at [facility]: [address] [place], [place] [address] at 9:30 AM. (1 hour prior to procedure unless lab work is needed; if lab work is needed arrive 1.5 hours ahead)  DIET: Nothing to eat or drink after midnight except a sip of water with medications (see medication instructions below)  Medication Instructions:  Continue your anticoagulant: Eliquis   You will need to continue your anticoagulant after your procedure until you  are told by your Provider that it is safe to stop  Labs: If patient is on Coumadin, patient needs pt/INR, CBC, BMET within 3 days (No pt/INR needed for patients taking Xarelto, Eliquis, Pradaxa) For patients receiving anesthesia for TEE and all Cardioversion patients: BMET, CBC within 1 week  Come to:  (Lab option #1) Come to the lab at [address] [facility] between the hours of 8:00 am and 4:30 pm. You do not have to be fasting.  You must have a responsible person to drive you home and stay in the waiting area during your procedure. Failure to do so could result in cancellation.  Bring your insurance cards.  *Special Note: Every effort is made to have your procedure done on time. Occasionally there are emergencies that occur  at the hospital that may cause delays. Please be patient if a delay does occur.

## 2019-11-18 NOTE — Progress Notes (Signed)
Cardiology Office Note:    Date:  11/20/2019   ID:  Cindy, Richmond 12-29-1950, MRN [id number]  PCP:  Mila Palmer, MD  [facility] Cardiologist:  [name], MD  [facility] Electrophysiologist:  None  Valve specialist: Dr. Clifton James  Referring MD: Mila Palmer, MD   Chief Complaint  Patient presents with  . Follow-up    seen for Dr. Jens Som     History of Present Illness:    Cindy Richmond is a 69 y.o. female with a hx of HTN, HLD, hypothyroidism, Parkinson's disease, atrial fibrillation and severe aortic stenosis s/p recent TAVR.  Carotid Doppler obtained on 05/11/2019 showed no carotid artery disease.  Echocardiogram obtained in March 2021 showed normal EF, severe aortic stenosis with gradient 42 mmHg.  She had symptomatic aortic stenosis with significant dyspnea on exertion.  Cardiac catheterization performed on 07/05/2019 showed no angiographic evidence of CAD, severe aortic stenosis with mean gradient 28.6 mmHg, AVA 0.9 cm.  At the time of her cath, she was found to be in new atrial fibrillation and has been placed on Eliquis.  Pre-TAVR CT showed mild right paratracheal lymphadenopathy that require no follow-up unless patient has a history of malignancy, he also revealed a subcentimeter hypodense left liver lesion that was too small to characterize and also requires no follow-up unless she has risk for liver malignancy. She eventually underwent TAVR procedure with placement of Edwards Sapien 3 Ultra THV size 26mm on 6/8 by Dr. Cornelius Moras and Dr. Clifton James.  Postop limited echocardiogram obtained on 08/04/2019 showed EF 60 to 65%, stable TAVR valve without paravalvular leak.  Repeat echocardiogram continue to show normal functioning bioprosthetic valve on 09/07/2019.  She was last seen by Cline Crock of valve clinic on 09/07/2019, at the time outpatient cardioversion has been discussed with the patient however prefer to hold off on the procedure at the time given how well she  felt.  Patient presents today for cardiology office visit.  She continues to experience palpitation, shortness of breath and slight fatigue.  Otherwise she feels okay after the recent TAVR procedure.  She denies any chest pain and has no lower extremity edema, orthopnea or PND.  We discussed benefit and risk of cardioversion.  She is very sure that she has been compliant with Eliquis for the past 3 weeks.  She is agreeable to proceed with outpatient cardioversion procedure.  We will see her back 2 weeks after.   Past Medical History:  Diagnosis Date  . Atrial fibrillation, persistent (HCC)   . Dyspnea    with head low, sleep apnea  . Dysrhythmia    afib  . GERD (gastroesophageal reflux disease)   . H/O hiatal hernia   . Hematuria   . History of kidney stones   . HOH (hard of hearing)    sightly, bilaterally  . Hyperlipidemia   . Hypertension   . Hypothyroidism   . Parkinson disease (HCC)   . S/P TAVR (transcatheter aortic valve replacement) 08/03/2019   s/p TAVR with 26 mm Edwards S3U via the TF approach by Drs Aundra Dubin & Cornelius Moras  . Severe aortic stenosis   . Sleep apnea     Past Surgical History:  Procedure Laterality Date  . ABDOMINAL HYSTERECTOMY     lso  . CYSTOSCOPY W/ RETROGRADES  02/28/2012   Procedure: CYSTOSCOPY WITH RETROGRADE PYELOGRAM;  Surgeon: Sebastian Ache, MD;  [facility];  Service: Urology;  Laterality: Right;  . CYSTOSCOPY WITH STENT PLACEMENT  02/28/2012  Procedure: CYSTOSCOPY WITH STENT PLACEMENT;  Surgeon: Sebastian Ache, MD;  Location: [facility];  Service: Urology;  Laterality: Left;  . CYSTOSCOPY/RETROGRADE/URETEROSCOPY/STONE EXTRACTION WITH BASKET  02/28/2012   Procedure: CYSTOSCOPY/RETROGRADE/URETEROSCOPY/STONE EXTRACTION WITH BASKET;  Surgeon: Sebastian Ache, MD;  Location[facility];  Service: Urology;  Laterality: Left;  . HOLMIUM LASER APPLICATION  02/28/2012   Procedure: HOLMIUM LASER APPLICATION;   Surgeon: Sebastian Ache, MD;  Location[facility];  Service: Urology;  Laterality: Left;  . INTRAOPERATIVE TRANSTHORACIC ECHOCARDIOGRAM  08/03/2019   Procedure: Intraoperative Transthoracic Echocardiogram;  Surgeon: Kathleene Hazel, MD;  Location: [facility] OR;  Service: Open Heart Surgery;;  . KNEE ARTHROSCOPY     right  . REVERSE SHOULDER ARTHROPLASTY Left 07/22/2018   Procedure: REVERSE SHOULDER ARTHROPLASTY;  Surgeon: Bjorn Pippin, MD;  Location: [facility];  Service: Orthopedics;  Laterality: Left;  . RIGHT/LEFT HEART CATH AND CORONARY ANGIOGRAPHY N/A 07/05/2019   Procedure: RIGHT/LEFT HEART CATH AND CORONARY ANGIOGRAPHY;  Surgeon: Kathleene Hazel, MD;  Location: [facility] IN[facility];  Service: Cardiovascular;  Laterality: N/A;  . TONSILLECTOMY    . TRANSCATHETER AORTIC VALVE REPLACEMENT, TRANSFEMORAL N/A 08/03/2019   Procedure: TRANSCATHETER AORTIC VALVE REPLACEMENT, TRANSFEMORAL;  Surgeon: Kathleene Hazel, MD;  Location: [facility] OR;  Service: Open Heart Surgery;  Laterality: N/A;  . VEIN LIGATION      Current Medications: Current Meds  Medication Sig  . apixaban (ELIQUIS) 5 MG TABS tablet Take 1 tablet (5 mg total) by mouth 2 (two) times daily.  Marland Kitchen aspirin 81 MG chewable tablet Chew 1 tablet (81 mg total) by mouth daily.  . bisoprolol-hydrochlorothiazide (ZIAC) 5-6.25 MG tablet Take 1 tablet by mouth daily.  . carbidopa-levodopa (SINEMET IR) 25-100 MG tablet 2 TABLETS AT 8AM/2 TABLETS AT NOON/1 TABLET AT 4 PM/1 TABLET AT 8PM (Patient taking differently: Take 1-2 tablets by mouth See admin instructions. Take 2 tablets at 8 am, 2 tablets at noon, 1 tablet at 4 pm, and 1 tablet at 8 pm)  . cephALEXin (KEFLEX) 500 MG capsule Take 2,000 mg (4 capsules) one hour to all dental visits. (Patient taking differently: Take 2,000 mg by mouth See admin instructions. Take 2,000 mg (4 capsules) one hour to all dental visits.)  . cholecalciferol (VITAMIN D) 25 MCG (1000 UNIT) tablet  Take 1,000 Units by mouth daily.  . Collagen-Boron-Hyaluronic Acid ([employer] JOINT HEALTH TRIPLE ACTION PO) Take 1 tablet by mouth daily. Triple Action Joint Health  . Cyanocobalamin (VITAMIN B-12) 5000 MCG TBDP Take 5,000 mcg by mouth daily.  Marland Kitchen docusate sodium (COLACE) 100 MG capsule Take 200 mg by mouth daily as needed (constipation.).   Marland Kitchen esomeprazole (NEXIUM) 20 MG capsule Take 20 mg by mouth daily.   Marland Kitchen levothyroxine (SYNTHROID, LEVOTHROID) 75 MCG tablet Take 75 mcg by mouth daily before breakfast.   . Menthol-Methyl Salicylate (SALONPAS PAIN RELIEF PATCH) PTCH Place 1 patch onto the skin daily as needed (pain.).  Marland Kitchen Multiple Vitamins-Minerals (PRESERVISION AREDS PO) Take 1 tablet by mouth daily.   . Omega-3 Fatty Acids (FISH OIL) 1200 MG CAPS Take 1,200 mg by mouth daily.  Bertram Gala Glycol-Propyl Glycol (SYSTANE OP) Place 1 drop into both eyes in the morning and at bedtime.      Allergies:   Nickel and Penicillins   Social History   Socioeconomic History  . Marital status: Widowed    Spouse name: Not on file  . Number of children: 3  . Years of education: Not on  file  . Highest education level: Associate degree: occupational, Scientist, product/process development, or vocational program  Occupational History  . 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 Smoking status: Never Smoker  . Smokeless tobacco: Never Used  Vaping Use  . Vaping Use: Never used  Substance and Sexual Activity  . Alcohol use: Never  . Drug use: Never  . Sexual activity: Not on file  Other Topics Concern  . Not on file  Social History Narrative  . Not on file   Social Determinants of Health   Financial Resource Strain:   . Difficulty of Paying Living Expenses: Not on file  Food Insecurity:   . Worried About Programme researcher, broadcasting/film/video in the Last Year: Not on file  . Ran Out of Food in the Last Year: Not on file  Transportation Needs:   . Lack of Transportation (Medical): Not on file  . Lack of Transportation  (Non-Medical): Not on file  Physical Activity:   . Days of Exercise per Week: Not on file  . Minutes of Exercise per Session: Not on file  Stress:   . Feeling of Stress : Not on file  Social Connections:   . Frequency of Communication with Friends and Family: Not on file  . Frequency of Social Gatherings with Friends and Family: Not on file  . Attends Religious Services: Not on file  . Active Member of Clubs or Organizations: Not on file  . Attends Banker Meetings: Not on file  . Marital Status: Not on file     Family History: The patient's family history includes Healthy in her sister and son; Heart failure in her father.  ROS:   Please see the history of present illness.     All other systems reviewed and are negative.  EKGs/Labs/Other Studies Reviewed:    The following studies were reviewed today:  Echo 08/04/2019 IMPRESSIONS    1. Left ventricular ejection fraction, by estimation, is 60 to 65%. The  left ventricle has normal function. The left ventricle has no regional  wall motion abnormalities. Left ventricular diastolic parameters are  indeterminate.  2. Right ventricular systolic function is normal. The right ventricular  size is normal.  3. The mitral valve is normal in structure. No evidence of mitral valve  regurgitation. No evidence of mitral stenosis.  4. Post TAVR with 26 mm Sapien 3 valve No PVL peak velocity 2.2 m/sec  peak gradient 19 mmHg mean 10 mmHg AVA 2.3 cm2 DVI 0.54 . The aortic valve  has been repaired/replaced. Aortic valve regurgitation is not visualized.  No aortic stenosis is present.  There is a 26 mm Edwards Sapien prosthetic (TAVR) valve present in the  aortic position. Procedure Date: 08/03/2019.  5. The inferior vena cava is normal in size with greater than 50%  respiratory variability, suggesting right atrial pressure of 3 mmHg.    Echo 09/09/2019 1. Left ventricular ejection fraction, by estimation, is 65 to 70%.  The  left ventricle has normal function. The left ventricle has no regional  wall motion abnormalities. There is mild left ventricular hypertrophy.  Left ventricular diastolic function  could not be evaluated.  2. Right ventricular systolic function is low normal. The right  ventricular size is normal. There is normal pulmonary artery systolic  pressure.  3. The aortic valve has been repaired/replaced. Aortic valve  regurgitation is not visualized. There is a 26 mm Edwards Sapien  prosthetic (TAVR) valve present in the aortic  position. Echo findings are  consistent with normal structure and function of the  aortic valve prosthesis. Aortic valve area, by VTI measures 1.49 cm.  Aortic valve mean gradient measures 6.5 mmHg. Aortic valve Vmax measures  1.96 m/s. Peak gradient 15 mmHg.  4. The inferior vena cava is normal in size with greater than 50%  respiratory variability, suggesting right atrial pressure of 3 mmHg.  5. The mitral valve is grossly normal. Trivial mitral valve  regurgitation.   EKG:  EKG is ordered today.  The ekg ordered today demonstrates atrial fibrillation, heart rate 82 bpm  Recent Labs: 07/30/2019: ALT 7; B Natriuretic Peptide 209.1 08/04/2019: BUN 12; Creatinine, Ser 0.84; Hemoglobin 14.7; Magnesium 1.9; Platelets 111; Potassium 3.9; Sodium 141  Recent Lipid Panel No results found for: CHOL, TRIG, HDL, CHOLHDL, VLDL, LDLCALC, LDLDIRECT  Physical Exam:    VS:  BP 134/78   Pulse 82   Ht 5\' 5"  (1.651 m)   Wt 270 lb (122.5 kg)   BMI 44.93 kg/m     Wt Readings from Last 3 Encounters:  11/18/19 270 lb (122.5 kg)  09/09/19 270 lb 9.6 oz (122.7 kg)  08/11/19 272 lb (123.4 kg)     GEN:  Well nourished, well developed in no acute distress HEENT: Normal NECK: No JVD; No carotid bruits LYMPHATICS: No lymphadenopathy CARDIAC: Irregularly irregular, no murmurs, rubs, gallops RESPIRATORY:  Clear to auscultation without rales, wheezing or rhonchi  ABDOMEN:  Soft, non-tender, non-distended MUSCULOSKELETAL:  No edema; No deformity  SKIN: Warm and dry NEUROLOGIC:  Alert and oriented x 3 PSYCHIATRIC:  Normal affect   ASSESSMENT:    1. Persistent atrial fibrillation (HCC)   2. Pre-procedural cardiovascular examination   3. S/P TAVR (transcatheter aortic valve replacement)   4. Essential hypertension   5. Hyperlipidemia LDL goal <100   6. Hypothyroidism, unspecified type    PLAN:    In order of problems listed above:  1. Persistent atrial fibrillation: She has been compliant with Eliquis for the past 3 weeks.  I recommended proceeding with outpatient cardioversion.  Risk and benefit has been discussed with the patient who is agreeable to proceed  2. History of recent TAVR: Stable on repeat echocardiogram 09/09/2019  3. Hypertension: Blood pressure stable on current therapy  4. Hyperlipidemia: On fish oil.  Will defer lab work to primary care provider.  Pre-TAVR cardiac catheterization showed no CAD.  LDL goal less than 100  5. Hypothyroidism: Managed by primary care provider   Medication Adjustments/Labs and Tests Ordered: Current medicines are reviewed at length with the patient today.  Concerns regarding medicines are outlined above.  Orders Placed This Encounter  Procedures  . Basic metabolic panel  . CBC  . EKG 12-Lead   No orders of the defined types were placed in this encounter.   Patient Instructions  Medication Instructions:  Your physician recommends that you continue on your current medications as directed. Please refer to the Current Medication list given to you today.  *If you need a refill on your cardiac medications before your next appointment, please call your pharmacy*  Lab Work: Your physician recommends that you return for lab work 1 week prior to Cardioverson:   BMET  CBC  You will also need to have a COVID test. This test is performed at [address], [place].   If you have labs  (blood work) drawn today and your tests are completely normal, you will receive your results only by: [id number]  MyChart Message (if you have MyChart) OR . A paper copy in the mail If you have any lab test that is abnormal or we need to change your treatment, we will call you to review the results.  Testing/Procedures: Your physician has recommended that you have a Cardioversion (DCCV). Electrical Cardioversion uses a jolt of electricity to your heart either through paddles or wired patches attached to your chest. This is a controlled, usually prescheduled, procedure. Defibrillation is done under light anesthesia in the hospital, and you usually go home the day of the procedure. This is done to get your heart back into a normal rhythm. You are not awake for the procedure. Please see the instruction sheet given to you today.  Cardioversion is scheduled for Tuesday 11/30/2019 at 10:30 AM   Follow-Up: At [facility], you and your health needs are our priority.  As part of our continuing mission to provide you with exceptional heart care, we have created designated Provider Care Teams.  These Care Teams include your primary Cardiologist (physician) and Advanced Practice Providers (APPs -  Physician Assistants and Nurse Practitioners) who all work together to provide you with the care you need, when you need it.  Your next appointment:   Follow up 2 weeks after Cardioversion   The format for your next appointment:   In Person  Provider:   Olga MillersBrian Crenshaw, MD or Azalee CourseHao Diondra Pines, PA-C  Other Instructions       Dear Santiago BurMyla Laker  You are scheduled for a Cardioversion on Tuesday 11/30/2019 with Dr. Olga MillersBrian Crenshaw.  Please [facility] at [facility]: [address] [place], [place] [address] at 9:30 AM. (1 hour prior to procedure unless lab work is needed; if lab work is needed arrive 1.5 hours ahead)  DIET: Nothing to eat or drink after midnight except a sip of water  with medications (see medication instructions below)  Medication Instructions:  Continue your anticoagulant: Eliquis   You will need to continue your anticoagulant after your procedure until you  are told by your Provider that it is safe to stop  Labs: If patient is on Coumadin, patient needs pt/INR, CBC, BMET within 3 days (No pt/INR needed for patients taking Xarelto, Eliquis, Pradaxa) For patients receiving anesthesia for TEE and all Cardioversion patients: BMET, CBC within 1 week  Come to:  (Lab option #1) Come to the lab at [address] between the hours of 8:00 am and 4:30 pm. You do not have to be fasting.  You must have a responsible person to drive you home and stay in the waiting area during your procedure. Failure to do so could result in cancellation.  Bring your insurance cards.  *Special Note: Every effort is made to have your procedure done on time. Occasionally there are emergencies that occur at the hospital that may cause delays. Please be patient if a delay does occur.      Ramond DialSigned, Angelisa Winthrop, [place]PA  11/20/2019 7:41 PM    [facility]

## 2019-11-20 ENCOUNTER — Encounter: Payer: Self-pay | Admitting: Physician Assistant

## 2019-11-20 ENCOUNTER — Other Ambulatory Visit: Payer: Self-pay | Admitting: Physician Assistant

## 2019-11-26 ENCOUNTER — Other Ambulatory Visit (HOSPITAL_COMMUNITY)
Admission: RE | Admit: 2019-11-26 | Discharge: 2019-11-26 | Disposition: A | Discharge status: Home / Self Care | Payer: Medicare PPO | Source: Ambulatory Visit | Attending: Cardiology | Admitting: Cardiology

## 2019-11-26 DIAGNOSIS — Z20822 Contact with and (suspected) exposure to covid-19: Secondary | ICD-10-CM | POA: Insufficient documentation

## 2019-11-26 DIAGNOSIS — Z01810 Encounter for preprocedural cardiovascular examination: Secondary | ICD-10-CM | POA: Diagnosis not present

## 2019-11-26 DIAGNOSIS — Z01812 Encounter for preprocedural laboratory examination: Secondary | ICD-10-CM | POA: Insufficient documentation

## 2019-11-26 LAB — CBC
Hematocrit: 49.3 % — ABNORMAL HIGH (ref 34.0–46.6)
Hemoglobin: 15.1 g/dL (ref 11.1–15.9)
MCH: 25.5 pg — ABNORMAL LOW (ref 26.6–33.0)
MCHC: 30.6 g/dL — ABNORMAL LOW (ref 31.5–35.7)
MCV: 83 fL (ref 79–97)
Platelets: 129 10*3/uL — ABNORMAL LOW (ref 150–450)
RBC: 5.91 x10E6/uL — ABNORMAL HIGH (ref 3.77–5.28)
RDW: 15.8 % — ABNORMAL HIGH (ref 11.7–15.4)
WBC: 9 10*3/uL (ref 3.4–10.8)

## 2019-11-26 LAB — BASIC METABOLIC PANEL
BUN/Creatinine Ratio: 17 (ref 12–28)
BUN: 14 mg/dL (ref 8–27)
CO2: 24 mmol/L (ref 20–29)
Calcium: 9.2 mg/dL (ref 8.7–10.3)
Chloride: 101 mmol/L (ref 96–106)
Creatinine, Ser: 0.84 mg/dL (ref 0.57–1.00)
GFR calc Af Amer: 81 mL/min/{1.73_m2} (ref >59)
GFR calc non Af Amer: 70 mL/min/{1.73_m2} (ref >59)
Glucose: 94 mg/dL (ref 65–99)
Potassium: 4.3 mmol/L (ref 3.5–5.2)
Sodium: 143 mmol/L (ref 134–144)

## 2019-11-26 LAB — SARS CORONAVIRUS 2 (TAT 6-24 HRS): SARS Coronavirus 2: NEGATIVE

## 2019-11-30 ENCOUNTER — Ambulatory Visit (HOSPITAL_COMMUNITY): Payer: Medicare PPO | Admitting: Certified Registered Nurse Anesthetist

## 2019-11-30 ENCOUNTER — Ambulatory Visit (HOSPITAL_COMMUNITY)
Admission: RE | Admit: 2019-11-30 | Discharge: 2019-11-30 | Disposition: A | Discharge status: Home / Self Care | Payer: Medicare PPO | Attending: Cardiology | Admitting: Cardiology

## 2019-11-30 ENCOUNTER — Encounter (HOSPITAL_COMMUNITY)
Admission: RE | Disposition: A | Discharge status: Home / Self Care | Payer: Self-pay | Source: Home / Self Care | Attending: Cardiology

## 2019-11-30 ENCOUNTER — Encounter (HOSPITAL_COMMUNITY): Payer: Self-pay | Admitting: Cardiology

## 2019-11-30 ENCOUNTER — Other Ambulatory Visit: Payer: Self-pay

## 2019-11-30 DIAGNOSIS — K219 Gastro-esophageal reflux disease without esophagitis: Secondary | ICD-10-CM | POA: Insufficient documentation

## 2019-11-30 DIAGNOSIS — I4819 Other persistent atrial fibrillation: Secondary | ICD-10-CM

## 2019-11-30 DIAGNOSIS — Z79899 Other long term (current) drug therapy: Secondary | ICD-10-CM | POA: Diagnosis not present

## 2019-11-30 DIAGNOSIS — Z79890 Hormone replacement therapy (postmenopausal): Secondary | ICD-10-CM | POA: Insufficient documentation

## 2019-11-30 DIAGNOSIS — Z952 Presence of prosthetic heart valve: Secondary | ICD-10-CM | POA: Diagnosis not present

## 2019-11-30 DIAGNOSIS — E039 Hypothyroidism, unspecified: Secondary | ICD-10-CM | POA: Diagnosis not present

## 2019-11-30 DIAGNOSIS — G4733 Obstructive sleep apnea (adult) (pediatric): Secondary | ICD-10-CM | POA: Diagnosis not present

## 2019-11-30 DIAGNOSIS — Z7982 Long term (current) use of aspirin: Secondary | ICD-10-CM | POA: Insufficient documentation

## 2019-11-30 DIAGNOSIS — Z880 Allergy status to penicillin: Secondary | ICD-10-CM | POA: Insufficient documentation

## 2019-11-30 DIAGNOSIS — I350 Nonrheumatic aortic (valve) stenosis: Secondary | ICD-10-CM | POA: Diagnosis not present

## 2019-11-30 DIAGNOSIS — G4730 Sleep apnea, unspecified: Secondary | ICD-10-CM | POA: Insufficient documentation

## 2019-11-30 DIAGNOSIS — Z91048 Other nonmedicinal substance allergy status: Secondary | ICD-10-CM | POA: Diagnosis not present

## 2019-11-30 DIAGNOSIS — I10 Essential (primary) hypertension: Secondary | ICD-10-CM | POA: Insufficient documentation

## 2019-11-30 DIAGNOSIS — Z7901 Long term (current) use of anticoagulants: Secondary | ICD-10-CM | POA: Diagnosis not present

## 2019-11-30 DIAGNOSIS — G20 Parkinson's disease: Secondary | ICD-10-CM | POA: Insufficient documentation

## 2019-11-30 DIAGNOSIS — E785 Hyperlipidemia, unspecified: Secondary | ICD-10-CM | POA: Insufficient documentation

## 2019-11-30 DIAGNOSIS — I110 Hypertensive heart disease with heart failure: Secondary | ICD-10-CM | POA: Diagnosis not present

## 2019-11-30 DIAGNOSIS — I5033 Acute on chronic diastolic (congestive) heart failure: Secondary | ICD-10-CM | POA: Diagnosis not present

## 2019-11-30 HISTORY — PX: CARDIOVERSION: SHX1299

## 2019-11-30 SURGERY — CARDIOVERSION
Anesthesia: General

## 2019-11-30 NOTE — H&P (Signed)
Office Visit 11/18/2019 [facility] Cindy Richmond, [place] [facility]  Persistent atrial fibrillation (HCC) +5 more Dx  Follow-up ; Referred by Mila Palmer, MD Reason for Visit  Additional Documentation  Vitals:  BP 134/78  Pulse 82  Ht 5\' 5"  (1.651 m)  Wt 122.5 kg  BMI 44.93 kg/m  BSA 2.37 m    More Vitals  Flowsheets:  Anthropometrics,  MEWS Score,  NEWS,  Method of Visit    Encounter Info:  Billing Info,  History,  Allergies,  Detailed Report    All Notes  Progress Notes by , PA at 11/18/2019 9:15 AM Author: 11/20/2019, PA Author Type: Physician Assistant Filed: 11/20/2019 7:41 PM  Note Status: Signed Cosign: Cosign Not Required Encounter Date: 11/18/2019  Editor: 11/20/2019, PA (Physician Assistant)               Expand All Collapse All  Cardiology Office Note:    Date:  11/20/2019   ID:  Cindy, Richmond Oct 03, 1950, MRN [id number]  PCP:  [id number], MD     [facility] Cardiologist:  [facility], MD  [facility] Electrophysiologist:  None  Valve specialist: Dr. [facility]  Referring MD: Clifton James, MD       Chief Complaint  Patient presents with  . Follow-up    seen for Dr. Mila Palmer     History of Present Illness:    Cindy Richmond is a 69 y.o. female with a hx of HTN, HLD, hypothyroidism, Parkinson's disease, atrial fibrillation and severe aortic stenosis s/p recent TAVR.  Carotid Doppler obtained on 05/11/2019 showed no carotid artery disease.  Echocardiogram obtained in March 2021 showed normal EF, severe aortic stenosis with gradient 42 mmHg.  She had symptomatic aortic stenosis with significant dyspnea on exertion.  Cardiac catheterization performed on 07/05/2019 showed no angiographic evidence of CAD, severe aortic stenosis with mean gradient 28.6 mmHg, AVA 0.9 cm.  At the time of her cath, she was found to be in new atrial fibrillation and has been placed on Eliquis.  Pre-TAVR CT showed mild right  paratracheal lymphadenopathy that require no follow-up unless patient has a history of malignancy, he also revealed a subcentimeter hypodense left liver lesion that was too small to characterize and also requires no follow-up unless she has risk for liver malignancy. She eventually underwent TAVR procedure with placement of Edwards Sapien 3 Ultra THV size 7mm on 6/8 by Dr. 8/8 and Dr. Cornelius Moras.  Postop limited echocardiogram obtained on 08/04/2019 showed EF 60 to 65%, stable TAVR valve without paravalvular leak.  Repeat echocardiogram continue to show normal functioning bioprosthetic valve on 09/07/2019.  She was last seen by 09/09/2019 of valve clinic on 09/07/2019, at the time outpatient cardioversion has been discussed with the patient however prefer to hold off on the procedure at the time given how well she felt.  Patient presents today for cardiology office visit.  She continues to experience palpitation, shortness of breath and slight fatigue.  Otherwise she feels okay after the recent TAVR procedure.  She denies any chest pain and has no lower extremity edema, orthopnea or PND.  We discussed benefit and risk of cardioversion.  She is very sure that she has been compliant with Eliquis for the past 3 weeks.  She is agreeable to proceed with outpatient cardioversion procedure.  We will see her back 2 weeks after.       Past Medical History:  Diagnosis Date  . Atrial fibrillation, persistent (  HCC)   . Dyspnea    with head low, sleep apnea  . Dysrhythmia    afib  . GERD (gastroesophageal reflux disease)   . H/O hiatal hernia   . Hematuria   . History of kidney stones   . HOH (hard of hearing)    sightly, bilaterally  . Hyperlipidemia   . Hypertension   . Hypothyroidism   . Parkinson disease (HCC)   . S/P TAVR (transcatheter aortic valve replacement) 08/03/2019   s/p TAVR with 26 mm Edwards S3U via the TF approach by Drs Aundra Dubin & Cornelius Moras  . Severe aortic stenosis     . Sleep apnea          Past Surgical History:  Procedure Laterality Date  . ABDOMINAL HYSTERECTOMY     lso  . CYSTOSCOPY W/ RETROGRADES  02/28/2012   Procedure: CYSTOSCOPY WITH RETROGRADE PYELOGRAM;  Surgeon: Sebastian Ache, MD;  Location: [facility];  Service: Urology;  Laterality: Right;  . CYSTOSCOPY WITH STENT PLACEMENT  02/28/2012   Procedure: CYSTOSCOPY WITH STENT PLACEMENT;  Surgeon: Sebastian Ache, MD;  Location: [facility];  Service: Urology;  Laterality: Left;  . CYSTOSCOPY/RETROGRADE/URETEROSCOPY/STONE EXTRACTION WITH BASKET  02/28/2012   Procedure: CYSTOSCOPY/RETROGRADE/URETEROSCOPY/STONE EXTRACTION WITH BASKET;  Surgeon: Sebastian Ache, MD;  Location: [facility];  Service: Urology;  Laterality: Left;  . HOLMIUM LASER APPLICATION  02/28/2012   Procedure: HOLMIUM LASER APPLICATION;  Surgeon: Sebastian Ache, MD;  Location: [facility];  Service: Urology;  Laterality: Left;  . INTRAOPERATIVE TRANSTHORACIC ECHOCARDIOGRAM  08/03/2019   Procedure: Intraoperative Transthoracic Echocardiogram;  Surgeon: Kathleene Hazel, MD;  Location: [facility] OR;  Service: Open Heart Surgery;;  . KNEE ARTHROSCOPY     right  . REVERSE SHOULDER ARTHROPLASTY Left 07/22/2018   Procedure: REVERSE SHOULDER ARTHROPLASTY;  Surgeon: Bjorn Pippin, MD;  Location: [facility];  Service: Orthopedics;  Laterality: Left;  . RIGHT/LEFT HEART CATH AND CORONARY ANGIOGRAPHY N/A 07/05/2019   Procedure: RIGHT/LEFT HEART CATH AND CORONARY ANGIOGRAPHY;  Surgeon: Kathleene Hazel, MD;  Location: [facility];  Service: Cardiovascular;  Laterality: N/A;  . TONSILLECTOMY    . TRANSCATHETER AORTIC VALVE REPLACEMENT, TRANSFEMORAL N/A 08/03/2019   Procedure: TRANSCATHETER AORTIC VALVE REPLACEMENT, TRANSFEMORAL;  Surgeon: Kathleene Hazel, MD;  Location: [facility] OR;  Service: Open Heart Surgery;  Laterality: N/A;  . VEIN LIGATION       Current Medications: Active Medications      Current Meds  Medication Sig  . apixaban (ELIQUIS) 5 MG TABS tablet Take 1 tablet (5 mg total) by mouth 2 (two) times daily.  Marland Kitchen aspirin 81 MG chewable tablet Chew 1 tablet (81 mg total) by mouth daily.  . bisoprolol-hydrochlorothiazide (ZIAC) 5-6.25 MG tablet Take 1 tablet by mouth daily.  . carbidopa-levodopa (SINEMET IR) 25-100 MG tablet 2 TABLETS AT 8AM/2 TABLETS AT NOON/1 TABLET AT 4 PM/1 TABLET AT 8PM (Patient taking differently: Take 1-2 tablets by mouth See admin instructions. Take 2 tablets at 8 am, 2 tablets at noon, 1 tablet at 4 pm, and 1 tablet at 8 pm)  . cephALEXin (KEFLEX) 500 MG capsule Take 2,000 mg (4 capsules) one hour to all dental visits. (Patient taking differently: Take 2,000 mg by mouth See admin instructions. Take 2,000 mg (4 capsules) one hour to all dental visits.)  . cholecalciferol (VITAMIN D) 25 MCG (1000 UNIT) tablet Take 1,000 Units by mouth daily.  . Collagen-Boron-Hyaluronic Acid ([facility] JOINT HEALTH TRIPLE ACTION PO) Take 1  tablet by mouth daily. Triple Action Joint Health  . Cyanocobalamin (VITAMIN B-12) 5000 MCG TBDP Take 5,000 mcg by mouth daily.  Marland Kitchen docusate sodium (COLACE) 100 MG capsule Take 200 mg by mouth daily as needed (constipation.).   Marland Kitchen esomeprazole (NEXIUM) 20 MG capsule Take 20 mg by mouth daily.   Marland Kitchen levothyroxine (SYNTHROID, LEVOTHROID) 75 MCG tablet Take 75 mcg by mouth daily before breakfast.   . Menthol-Methyl Salicylate (SALONPAS PAIN RELIEF PATCH) PTCH Place 1 patch onto the skin daily as needed (pain.).  Marland Kitchen Multiple Vitamins-Minerals (PRESERVISION AREDS PO) Take 1 tablet by mouth daily.   . Omega-3 Fatty Acids (FISH OIL) 1200 MG CAPS Take 1,200 mg by mouth daily.  Bertram Gala Glycol-Propyl Glycol (SYSTANE OP) Place 1 drop into both eyes in the morning and at bedtime.        Allergies:   Nickel and Penicillins   Social History        Socioeconomic History  . Marital status: Widowed     Spouse name: Not on file  . Number of children: 3  . Years of education: Not on file  . Highest education level: Associate degree: occupational, Scientist, product/process development, or vocational program  Occupational History  . 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 Smoking status: Never Smoker  . Smokeless tobacco: Never Used  Vaping Use  . Vaping Use: Never used  Substance and Sexual Activity  . Alcohol use: Never  . Drug use: Never  . Sexual activity: Not on file  Other Topics Concern  . Not on file  Social History Narrative  . Not on file   Social Determinants of Health      Financial Resource Strain:   . Difficulty of Paying Living Expenses: Not on file  Food Insecurity:   . Worried About Programme researcher, broadcasting/film/video in the Last Year: Not on file  . Ran Out of Food in the Last Year: Not on file  Transportation Needs:   . Lack of Transportation (Medical): Not on file  . Lack of Transportation (Non-Medical): Not on file  Physical Activity:   . Days of Exercise per Week: Not on file  . Minutes of Exercise per Session: Not on file  Stress:   . Feeling of Stress : Not on file  Social Connections:   . Frequency of Communication with Friends and Family: Not on file  . Frequency of Social Gatherings with Friends and Family: Not on file  . Attends Religious Services: Not on file  . Active Member of Clubs or Organizations: Not on file  . Attends Banker Meetings: Not on file  . Marital Status: Not on file     Family History: The patient's family history includes Healthy in her sister and son; Heart failure in her father.  ROS:   Please see the history of present illness.     All other systems reviewed and are negative.  EKGs/Labs/Other Studies Reviewed:    The following studies were reviewed today:  Echo 08/04/2019 IMPRESSIONS    1. Left ventricular ejection fraction, by estimation, is 60 to 65%. The  left ventricle has normal function. The  left ventricle has no regional  wall motion abnormalities. Left ventricular diastolic parameters are  indeterminate.  2. Right ventricular systolic function is normal. The right ventricular  size is normal.  3. The mitral valve is normal in structure. No evidence of mitral valve  regurgitation. No evidence of mitral stenosis.  4.  Post TAVR with 26 mm Sapien 3 valve No PVL peak velocity 2.2 m/sec  peak gradient 19 mmHg mean 10 mmHg AVA 2.3 cm2 DVI 0.54 . The aortic valve  has been repaired/replaced. Aortic valve regurgitation is not visualized.  No aortic stenosis is present.  There is a 26 mm Edwards Sapien prosthetic (TAVR) valve present in the  aortic position. Procedure Date: 08/03/2019.  5. The inferior vena cava is normal in size with greater than 50%  respiratory variability, suggesting right atrial pressure of 3 mmHg.    Echo 09/09/2019 1. Left ventricular ejection fraction, by estimation, is 65 to 70%. The  left ventricle has normal function. The left ventricle has no regional  wall motion abnormalities. There is mild left ventricular hypertrophy.  Left ventricular diastolic function  could not be evaluated.  2. Right ventricular systolic function is low normal. The right  ventricular size is normal. There is normal pulmonary artery systolic  pressure.  3. The aortic valve has been repaired/replaced. Aortic valve  regurgitation is not visualized. There is a 26 mm Edwards Sapien  prosthetic (TAVR) valve present in the aortic position. Echo findings are  consistent with normal structure and function of the  aortic valve prosthesis. Aortic valve area, by VTI measures 1.49 cm.  Aortic valve mean gradient measures 6.5 mmHg. Aortic valve Vmax measures  1.96 m/s. Peak gradient 15 mmHg.  4. The inferior vena cava is normal in size with greater than 50%  respiratory variability, suggesting right atrial pressure of 3 mmHg.  5. The mitral valve is grossly normal. Trivial  mitral valve  regurgitation.   EKG:  EKG is ordered today.  The ekg ordered today demonstrates atrial fibrillation, heart rate 82 bpm  Recent Labs: 07/30/2019: ALT 7; B Natriuretic Peptide 209.1 08/04/2019: BUN 12; Creatinine, Ser 0.84; Hemoglobin 14.7; Magnesium 1.9; Platelets 111; Potassium 3.9; Sodium 141  Recent Lipid Panel Labs (Brief)  No results found for: CHOL, TRIG, HDL, CHOLHDL, VLDL, LDLCALC, LDLDIRECT    Physical Exam:    VS:  BP 134/78   Pulse 82   Ht  (1.651 m)   Wt 270 lb (122.5 kg)   BMI 44.93 kg/m        Wt Readings from Last 3 Encounters:  11/18/19 270 lb (122.5 kg)  09/09/19 270 lb 9.6 oz (122.7 kg)  08/11/19 272 lb (123.4 kg)     GEN:  Well nourished, well developed in no acute distress HEENT: Normal NECK: No JVD; No carotid bruits LYMPHATICS: No lymphadenopathy CARDIAC: Irregularly irregular, no murmurs, rubs, gallops RESPIRATORY:  Clear to auscultation without rales, wheezing or rhonchi  ABDOMEN: Soft, non-tender, non-distended MUSCULOSKELETAL:  No edema; No deformity  SKIN: Warm and dry NEUROLOGIC:  Alert and oriented x 3 PSYCHIATRIC:  Normal affect   ASSESSMENT:    1. Persistent atrial fibrillation (HCC)   2. Pre-procedural cardiovascular examination   3. S/P TAVR (transcatheter aortic valve replacement)   4. Essential hypertension   5. Hyperlipidemia LDL goal <100   6. Hypothyroidism, unspecified type    PLAN:    In order of problems listed above:  1. Persistent atrial fibrillation: She has been compliant with Eliquis for the past 3 weeks.  I recommended proceeding with outpatient cardioversion.  Risk and benefit has been discussed with the patient who is agreeable to proceed  2. History of recent TAVR: Stable on repeat echocardiogram 09/09/2019  3. Hypertension: Blood pressure stable on current therapy  4. Hyperlipidemia: On fish oil.  Will  defer lab work to primary care provider.  Pre-TAVR cardiac catheterization  showed no CAD.  LDL goal less than 100  5. Hypothyroidism: Managed by primary care provider   Medication Adjustments/Labs and Tests Ordered: Current medicines are reviewed at length with the patient today.  Concerns regarding medicines are outlined above.     Orders Placed This Encounter  Procedures  . Basic metabolic panel  . CBC  . EKG 12-Lead   No orders of the defined types were placed in this encounter.   Patient Instructions  Medication Instructions:  Your physician recommends that you continue on your current medications as directed. Please refer to the Current Medication list given to you today.  *If you need a refill on your cardiac medications before your next appointment, please call your pharmacy*  Lab Work: Your physician recommends that you return for lab work 1 week prior to Cardioverson:   BMET  CBC  You will also need to have a COVID test. This test is performed at [address] [place], [place] [address].   If you have labs (blood work) drawn today and your tests are completely normal, you will receive your results only by:  MyChart Message (if you have MyChart) OR  A paper copy in the mail If you have any lab test that is abnormal or we need to change your treatment, we will call you to review the results.  Testing/Procedures: Your physician has recommended that you have a Cardioversion (DCCV). Electrical Cardioversion uses a jolt of electricity to your heart either through paddles or wired patches attached to your chest. This is a controlled, usually prescheduled, procedure. Defibrillation is done under light anesthesia in the hospital, and you usually go home the day of the procedure. This is done to get your heart back into a normal rhythm. You are not awake for the procedure. Please see the instruction sheet given to you today.  Cardioversion is scheduled for Tuesday 11/30/2019 at 10:30 AM   Follow-Up: At [facility], you and your  health needs are our priority. As part of our continuing mission to provide you with exceptional heart care, we have created designated Provider Care Teams. These Care Teams include your primary Cardiologist (physician) and Advanced Practice Providers (APPs -  Physician Assistants and Nurse Practitioners) who all work together to provide you with the care you need, when you need it.  Your next appointment:   Follow up 2 weeks after Cardioversion   The format for your next appointment:   In Person  Provider:   [name], MD or Azalee Course, PA-C  Other Instructions     Dear Cindy Richmond  You are scheduled for a Cardioversion on Tuesday 11/30/2019 with Dr. [name].  Please arrive at the [facility] (Main Entrance A) at [facility]: [address] [place], [place] [address] at 9:30 AM. (1 hour prior to procedure unless lab work is needed; if lab work is needed arrive 1.5 hours ahead)  DIET: Nothing to eat or drink after midnight except a sip of water with medications (see medication instructions below)  Medication Instructions:  Continue your anticoagulant: Eliquis   You will need to continue your anticoagulant after your procedure until you            are told by your Provider that it is safe to stop  Labs: If patient is on Coumadin, patient needs pt/INR, CBC, BMET within 3 days (No pt/INR needed for patients taking Xarelto, Eliquis, Pradaxa)  For patients receiving anesthesia for TEE and all Cardioversion patients: BMET, CBC within 1 week  Come to:  (Lab option #1) Come to the lab at [address] between the hours of 8:00 am and 4:30 pm. You do not have to be fasting.  You must have a responsible person to drive you home and stay in the waiting area during your procedure. Failure to do so could result in cancellation.  Bring your insurance cards.  *Special Note: Every effort is made to have your procedure done on time. Occasionally there are  emergencies that occur at the hospital that may cause delays. Please be patient if a delay does occur.      Ramond DialSigned, Hao Meng, [place]  11/20/2019 7:41 PM    [facility]     For DCCV; compliant with apixaban; no changes Olga MillersBrian Dalana Pfahler

## 2019-11-30 NOTE — Anesthesia Preprocedure Evaluation (Addendum)
Anesthesia Evaluation  Patient identified by MRN, date of birth, ID band Patient awake    Reviewed: Allergy & Precautions, NPO status , Patient's Chart, lab work & pertinent test results, reviewed documented beta blocker date and time   Airway Mallampati: III  TM Distance: >3 FB Neck ROM: Full    Dental no notable dental hx. (+) Teeth Intact, Caps   Pulmonary shortness of breath and with exertion, sleep apnea ,    Pulmonary exam normal breath sounds clear to auscultation       Cardiovascular hypertension, Pt. on medications + dysrhythmias Atrial Fibrillation + Valvular Problems/Murmurs  Rhythm:Irregular Rate:Normal  S/P TAVR 08/03/19   Neuro/Psych negative neurological ROS  negative psych ROS   GI/Hepatic Neg liver ROS, hiatal hernia, GERD  Medicated and Controlled,  Endo/Other  Hypothyroidism Morbid obesityHyperlipidemia  Renal/GU negative Renal ROS  negative genitourinary   Musculoskeletal Myalgias lower extremities   Abdominal (+) + obese,   Peds  Hematology Eliquis therapy- last dose this am   Anesthesia Other Findings   Reproductive/Obstetrics                            Anesthesia Physical Anesthesia Plan  ASA: III  Anesthesia Plan: General   Post-op Pain Management:    Induction: Intravenous  PONV Risk Score and Plan: 3 and Ondansetron and Treatment may vary due to age or medical condition  Airway Management Planned: Mask and Natural Airway  Additional Equipment:   Intra-op Plan:   Post-operative Plan:   Informed Consent: I have reviewed the patients History and Physical, chart, labs and discussed the procedure including the risks, benefits and alternatives for the proposed anesthesia with the patient or authorized representative who has indicated his/her understanding and acceptance.     Dental advisory given  Plan Discussed with: CRNA and Anesthesiologist  Anesthesia  Plan Comments:         Anesthesia Quick Evaluation

## 2019-11-30 NOTE — Anesthesia Postprocedure Evaluation (Signed)
Anesthesia Post Note  Patient: Cindy Richmond  Procedure(s) Performed: CARDIOVERSION (N/A )     Patient location during evaluation: PACU Anesthesia Type: General Level of consciousness: awake and alert and oriented Pain management: pain level controlled Vital Signs Assessment: post-procedure vital signs reviewed and stable Respiratory status: spontaneous breathing, nonlabored ventilation and respiratory function stable Cardiovascular status: blood pressure returned to baseline and stable Postop Assessment: no apparent nausea or vomiting Anesthetic complications: no   No complications documented.  Last Vitals:  Vitals:   11/30/19 0938 11/30/19 1006  BP: (!) 157/71 129/76  Pulse: 87 94  Resp: (!) 22 19  Temp: (!) 36.2 C (!) 36.4 C  SpO2: 100% 98%    Last Pain:  Vitals:   11/30/19 1006  TempSrc: Temporal  PainSc:                  Muzammil Bruins A.

## 2019-11-30 NOTE — Discharge Instructions (Signed)
Electrical Cardioversion Electrical cardioversion is the delivery of a jolt of electricity to restore a normal rhythm to the heart. A rhythm that is too fast or is not regular keeps the heart from pumping well. In this procedure, sticky patches or metal paddles are placed on the chest to deliver electricity to the heart from a device. This procedure may be done in an emergency if:  There is low or no blood pressure as a result of the heart rhythm.  Normal rhythm must be restored as fast as possible to protect the brain and heart from further damage.  It may save a life. This may also be a scheduled procedure for irregular or fast heart rhythms that are not immediately life-threatening. Tell a health care provider about:  Any allergies you have.  All medicines you are taking, including vitamins, herbs, eye drops, creams, and over-the-counter medicines.  Any problems you or family members have had with anesthetic medicines.  Any blood disorders you have.  Any surgeries you have had.  Any medical conditions you have.  Whether you are pregnant or may be pregnant. What are the risks? Generally, this is a safe procedure. However, problems may occur, including:  Allergic reactions to medicines.  A blood clot that breaks free and travels to other parts of your body.  The possible return of an abnormal heart rhythm within hours or days after the procedure.  Your heart stopping (cardiac arrest). This is rare. What happens before the procedure? Medicines  Your health care provider may have you start taking: ? Blood-thinning medicines (anticoagulants) so your blood does not clot as easily. ? Medicines to help stabilize your heart rate and rhythm.  Ask your health care provider about: ? Changing or stopping your regular medicines. This is especially important if you are taking diabetes medicines or blood thinners. ? Taking medicines such as aspirin and ibuprofen. These medicines can  thin your blood. Do not take these medicines unless your health care provider tells you to take them. ? Taking over-the-counter medicines, vitamins, herbs, and supplements. General instructions  Follow instructions from your health care provider about eating or drinking restrictions.  Plan to have someone take you home from the hospital or clinic.  If you will be going home right after the procedure, plan to have someone with you for 24 hours.  Ask your health care provider what steps will be taken to help prevent infection. These may include washing your skin with a germ-killing soap. What happens during the procedure?   An IV will be inserted into one of your veins.  Sticky patches (electrodes) or metal paddles may be placed on your chest.  You will be given a medicine to help you relax (sedative).  An electrical shock will be delivered. The procedure may vary among health care providers and hospitals. What can I expect after the procedure?  Your blood pressure, heart rate, breathing rate, and blood oxygen level will be monitored until you leave the hospital or clinic.  Your heart rhythm will be watched to make sure it does not change.  You may have some redness on the skin where the shocks were given. Follow these instructions at home:  Do not drive for 24 hours if you were given a sedative during your procedure.  Take over-the-counter and prescription medicines only as told by your health care provider.  Ask your health care provider how to check your pulse. Check it often.  Rest for 48 hours after the procedure or   as told by your health care provider.  Avoid or limit your caffeine use as told by your health care provider.  Keep all follow-up visits as told by your health care provider. This is important. Contact a health care provider if:  You feel like your heart is beating too quickly or your pulse is not regular.  You have a serious muscle cramp that does not go  away. Get help right away if:  You have discomfort in your chest.  You are dizzy or you feel faint.  You have trouble breathing or you are short of breath.  Your speech is slurred.  You have trouble moving an arm or leg on one side of your body.  Your fingers or toes turn cold or blue. Summary  Electrical cardioversion is the delivery of a jolt of electricity to restore a normal rhythm to the heart.  This procedure may be done right away in an emergency or may be a scheduled procedure if the condition is not an emergency.  Generally, this is a safe procedure.  After the procedure, check your pulse often as told by your health care provider. This information is not intended to replace advice given to you by your health care provider. Make sure you discuss any questions you have with your health care provider. Document Revised: 09/14/2018 Document Reviewed: 09/14/2018 Elsevier Patient Education  2020 [employer]  

## 2019-11-30 NOTE — Telephone Encounter (Signed)
I have called the patient, not confident her symptom is a vascular issue. Chance of DVT quite low of Eliquis. Her leg pain is constant and does not worsen with walking, suspicion for PAD also low. I recommended she see her PCP for evaluation. I also reviewed her DCCV report today, unfortunately she was only out of afib for 2 mins before going back to afib, please refer the patient to the afib clinic as indicated by Dr. Ludwig Clarks note.

## 2019-11-30 NOTE — Addendum Note (Signed)
Addendum  created 11/30/19 1017 by Mal Amabile, MD   Clinical Note Signed

## 2019-11-30 NOTE — Procedures (Signed)
Electrical Cardioversion Procedure Note Cindy Richmond [id number] [birth year]  Procedure: Electrical Cardioversion Indications:  Atrial Fibrillation  Procedure Details Consent: Risks of procedure as well as the alternatives and risks of each were explained to the (patient/caregiver).  Consent for procedure obtained. Time Out: Verified patient identification, verified procedure, site/side was marked, verified correct patient position, special equipment/implants available, medications/allergies/relevent history reviewed, required imaging and test results available.  Performed  Patient placed on cardiac monitor, pulse oximetry, supplemental oxygen as necessary.  Sedation given: Pt sedated by anesthesia with lidocaine 50 mg and diprovan 50 mg IV. Pacer pads placed anterior and posterior chest.  Cardioverted 1 time(s).  Cardioverted at 200J.  Evaluation Findings: Post procedure EKG shows: Initially pt in sinus but after approximately 2 min developed recurrent atrial fibrillation; will need antiarrhythmic to maintain sinus; will arrange fu in atrial fibrillation clinic. Complications: None Patient did tolerate procedure well.   [name] 11/30/2019, 9:28 AM

## 2019-11-30 NOTE — Interval H&P Note (Signed)
History and Physical Interval Note:  11/30/2019 9:27 AM  Cindy Richmond  has presented today for surgery, with the diagnosis of PERSISTANT A-FIB.  The various methods of treatment have been discussed with the patient and family. After consideration of risks, benefits and other options for treatment, the patient has consented to  Procedure(s): CARDIOVERSION (N/A) as a surgical intervention.  The patient's history has been reviewed, patient examined, no change in status, stable for surgery.  I have reviewed the patient's chart and labs.  Questions were answered to the patient's satisfaction.     [name]

## 2019-11-30 NOTE — Progress Notes (Signed)
Stable renal function and electrolyte. Red blood cell borderline high, unclear significance, please send CBC report to PCP for review.

## 2019-11-30 NOTE — Anesthesia Procedure Notes (Signed)
Procedure Name: General with mask airway Date/Time: 11/30/2019 9:51 AM Performed by: Jodell Cipro, CRNA Pre-anesthesia Checklist: Patient identified, Emergency Drugs available, Suction available, Patient being monitored and Timeout performed Patient Re-evaluated:Patient Re-evaluated prior to induction Oxygen Delivery Method: Ambu bag Dental Injury: Teeth and Oropharynx as per pre-operative assessment

## 2019-11-30 NOTE — Transfer of Care (Signed)
Immediate Anesthesia Transfer of Care Note  Patient: Cindy Richmond  Procedure(s) Performed: CARDIOVERSION (N/A )  Patient Location: Endoscopy Unit  Anesthesia Type:General  Level of Consciousness: awake, alert  and oriented  Airway & Oxygen Therapy: Patient Spontanous Breathing and Patient connected to nasal cannula oxygen  Post-op Assessment: Report given to RN, Post -op Vital signs reviewed and stable and Patient moving all extremities  Post vital signs: Reviewed and stable  Last Vitals:  Vitals Value Taken Time  BP    Temp    Pulse    Resp    SpO2      Last Pain:  Vitals:   11/30/19 0938  TempSrc: Temporal  PainSc: 0-No pain         Complications: No complications documented.

## 2019-12-01 ENCOUNTER — Encounter (HOSPITAL_COMMUNITY): Payer: Self-pay | Admitting: Nurse Practitioner

## 2019-12-01 ENCOUNTER — Ambulatory Visit (HOSPITAL_COMMUNITY)
Admission: RE | Admit: 2019-12-01 | Discharge: 2019-12-01 | Disposition: A | Discharge status: Home / Self Care | Payer: Medicare PPO | Source: Ambulatory Visit | Attending: Nurse Practitioner | Admitting: Nurse Practitioner

## 2019-12-01 VITALS — BP 116/78 | HR 85 | Ht 65.0 in | Wt 270.6 lb

## 2019-12-01 DIAGNOSIS — Z7982 Long term (current) use of aspirin: Secondary | ICD-10-CM | POA: Insufficient documentation

## 2019-12-01 DIAGNOSIS — Z96612 Presence of left artificial shoulder joint: Secondary | ICD-10-CM | POA: Insufficient documentation

## 2019-12-01 DIAGNOSIS — E785 Hyperlipidemia, unspecified: Secondary | ICD-10-CM | POA: Insufficient documentation

## 2019-12-01 DIAGNOSIS — D6869 Other thrombophilia: Secondary | ICD-10-CM

## 2019-12-01 DIAGNOSIS — G20 Parkinson's disease: Secondary | ICD-10-CM | POA: Insufficient documentation

## 2019-12-01 DIAGNOSIS — Z79899 Other long term (current) drug therapy: Secondary | ICD-10-CM | POA: Insufficient documentation

## 2019-12-01 DIAGNOSIS — I4819 Other persistent atrial fibrillation: Secondary | ICD-10-CM

## 2019-12-01 DIAGNOSIS — Z79890 Hormone replacement therapy (postmenopausal): Secondary | ICD-10-CM | POA: Insufficient documentation

## 2019-12-01 DIAGNOSIS — Z7901 Long term (current) use of anticoagulants: Secondary | ICD-10-CM | POA: Diagnosis not present

## 2019-12-01 DIAGNOSIS — I10 Essential (primary) hypertension: Secondary | ICD-10-CM | POA: Diagnosis not present

## 2019-12-01 DIAGNOSIS — K219 Gastro-esophageal reflux disease without esophagitis: Secondary | ICD-10-CM | POA: Diagnosis not present

## 2019-12-01 NOTE — Progress Notes (Addendum)
Primary Care Physician: Mila Palmer, MD Referring Physician: Dr. Donzetta Starch Cindy Richmond is a 69 y.o. female with a h/o morbid obesity,hypothyroidism,  severe aortic stenosis,  s/p TAVR 08/15/19, OSA, diastolic  HF, found to be in afib with RVR at time  of catheterization in May 2021. She did not have evidence of CAD. She was started on eliquis, was offered a cardioversion in July, when seen by Carlean Jews, PA, which she declined as she felt she was doing ok. Echo at that time EF 60%, normally functioning TAVR with a mean gradient of 6.5 mmHg and no PVL. She had NYHA class I symptoms. She had a cardioversion yesterday and only had a few minutes of SR.    She was referred to afib clinic to discuss antiarrythmic's. She also called the [facility] cardiology office yesterday to c/o leg swelling of the rt leg.  She states that overall she feels ok. She does  walk with a cane and her mobility/QOL  is limited by her Parkinson's diease. She feels her shortness of breath did improve to some degree after the procedure but she still has shortness of breath with exertion.  She is c/o of seeing more irregularity of her skin of her rt leg and aching of that leg. Both legs are very soft, no fluid appreciated,  but large with a lot of adipose tissue and  ropey varicose veins. There is a lot of spider veins noted at the ankle area. Her weight is exactly the same for months now so I do not think she has excess fluid. She did have her veins stripped 40 years ago. She has knee high support socks but does not wear them. There is no redness, tenderness, pain of rt calf and being on anticoagulation without any missed doses , makes me have low suspicion for a DVT.  She is rate controlled. She continues on eliquis with a CHA2DS2VASc score of 3.  Today, she denies symptoms of palpitations, chest pain, + for shortness of breath, - for  orthopnea, PND, no significant  lower extremity edema noted, no  dizziness,  presyncope, syncope, or neurologic sequela. The patient is tolerating medications without difficulties and is otherwise without complaint today.   Past Medical History:  Diagnosis Date  . Atrial fibrillation, persistent (HCC)   . Dyspnea    with head low, sleep apnea  . Dysrhythmia    afib  . GERD (gastroesophageal reflux disease)   . H/O hiatal hernia   . Hematuria   . History of kidney stones   . HOH (hard of hearing)    sightly, bilaterally  . Hyperlipidemia   . Hypertension   . Hypothyroidism   . Parkinson disease (HCC)   . S/P TAVR (transcatheter aortic valve replacement) 08/03/2019   s/p TAVR with 26 mm Edwards S3U via the TF approach by Drs Aundra Dubin & Cornelius Moras  . Severe aortic stenosis   . Sleep apnea    Past Surgical History:  Procedure Laterality Date  . ABDOMINAL HYSTERECTOMY     lso  . CYSTOSCOPY W/ RETROGRADES  02/28/2012   Procedure: CYSTOSCOPY WITH RETROGRADE PYELOGRAM;  Surgeon: Sebastian Ache, MD;  Location: [facility];  Service: Urology;  Laterality: Right;  . CYSTOSCOPY WITH STENT PLACEMENT  02/28/2012   Procedure: CYSTOSCOPY WITH STENT PLACEMENT;  Surgeon: Sebastian Ache, MD;  [facility];  Service: Urology;  Laterality: Left;  . CYSTOSCOPY/RETROGRADE/URETEROSCOPY/STONE EXTRACTION WITH BASKET  02/28/2012   Procedure: CYSTOSCOPY/RETROGRADE/URETEROSCOPY/STONE EXTRACTION  WITH BASKET;  Surgeon: Sebastian Ache, MD;  Location: [facility];  Service: Urology;  Laterality: Left;  . HOLMIUM LASER APPLICATION  02/28/2012   Procedure: HOLMIUM LASER APPLICATION;  Surgeon: Sebastian Ache, MD;  Location[facility];  Service: Urology;  Laterality: Left;  . INTRAOPERATIVE TRANSTHORACIC ECHOCARDIOGRAM  08/03/2019   Procedure: Intraoperative Transthoracic Echocardiogram;  Surgeon: Kathleene Hazel, MD;  Location: [facility] OR;  Service: Open Heart Surgery;;  . KNEE ARTHROSCOPY     right  . REVERSE SHOULDER  ARTHROPLASTY Left 07/22/2018   Procedure: REVERSE SHOULDER ARTHROPLASTY;  Surgeon: Bjorn Pippin, MD;  Location: [facility];  Service: Orthopedics;  Laterality: Left;  . RIGHT/LEFT HEART CATH AND CORONARY ANGIOGRAPHY N/A 07/05/2019   Procedure: RIGHT/LEFT HEART CATH AND CORONARY ANGIOGRAPHY;  Surgeon: Kathleene Hazel, MD;  Location: [facility];  Service: Cardiovascular;  Laterality: N/A;  . TONSILLECTOMY    . TRANSCATHETER AORTIC VALVE REPLACEMENT, TRANSFEMORAL N/A 08/03/2019   Procedure: TRANSCATHETER AORTIC VALVE REPLACEMENT, TRANSFEMORAL;  Surgeon: Kathleene Hazel, MD;  Location: [facility] OR;  Service: Open Heart Surgery;  Laterality: N/A;  . VEIN LIGATION      Current Outpatient Medications  Medication Sig Dispense Refill  . apixaban (ELIQUIS) 5 MG TABS tablet Take 1 tablet (5 mg total) by mouth 2 (two) times daily. 60 tablet 6  . aspirin 81 MG chewable tablet Chew 1 tablet (81 mg total) by mouth daily.    . bisoprolol-hydrochlorothiazide (ZIAC) 5-6.25 MG tablet Take 1 tablet by mouth daily.    . carbidopa-levodopa (SINEMET IR) 25-100 MG tablet 2 TABLETS AT 8AM/2 TABLETS AT NOON/1 TABLET AT 4 PM/1 TABLET AT 8PM 180 tablet 0  . cephALEXin (KEFLEX) 500 MG capsule Take 2,000 mg (4 capsules) one hour to all dental visits. 8 capsule 11  . cholecalciferol (VITAMIN D) 25 MCG (1000 UNIT) tablet Take 1,000 Units by mouth daily.    . Collagen-Boron-Hyaluronic Acid ([facility] JOINT HEALTH TRIPLE ACTION PO) Take 1 tablet by mouth daily. Triple Action Joint Health    . Cyanocobalamin (VITAMIN B-12) 5000 MCG TBDP Take 5,000 mcg by mouth daily.    Marland Kitchen docusate sodium (COLACE) 100 MG capsule Take 200 mg by mouth daily as needed (constipation.).     Marland Kitchen esomeprazole (NEXIUM) 20 MG capsule Take 20 mg by mouth daily.     Marland Kitchen levothyroxine (SYNTHROID, LEVOTHROID) 75 MCG tablet Take 75 mcg by mouth daily before breakfast.     . Menthol-Methyl Salicylate (SALONPAS PAIN RELIEF PATCH) PTCH Place 1 patch onto the  skin daily as needed (pain.).    Marland Kitchen Multiple Vitamins-Minerals (PRESERVISION AREDS PO) Take 1 tablet by mouth daily.     . Omega-3 Fatty Acids (FISH OIL) 1200 MG CAPS Take 1,200 mg by mouth daily.    Bertram Gala Glycol-Propyl Glycol (SYSTANE OP) Place 1 drop into both eyes in the morning and at bedtime.      No current facility-administered medications for this encounter.    Allergies  Allergen Reactions  . Nickel     Nickel earrings, ear holes get infected  . Penicillins Other (See Comments)    unknown Did it involve swelling of the face/tongue/throat, SOB, or low BP? Unknown Did it involve sudden or severe rash/hives, skin peeling, or any reaction on the inside of your mouth or nose? Unknown Did you need to seek medical attention at a hospital or doctor's office? Unknown When did it last happen?newborn allergy If all above answers are "NO", may  proceed with cephalosporin use.     Social History   Socioeconomic History  . Marital status: Widowed    Spouse name: Not on file  . Number of children: 3  . Years of education: Not on file  . Highest education level: Associate degree: occupational, Scientist, product/process development, or vocational program  Occupational History  . 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 Smoking status: Never Smoker  . Smokeless tobacco: Never Used  Vaping Use  . Vaping Use: Never used  Substance and Sexual Activity  . Alcohol use: Never  . Drug use: Never  . Sexual activity: Not on file  Other Topics Concern  . Not on file  Social History Narrative  . Not on file   Social Determinants of Health   Financial Resource Strain:   . Difficulty of Paying Living Expenses: Not on file  Food Insecurity:   . Worried About Programme researcher, broadcasting/film/video in the Last Year: Not on file  . Ran Out of Food in the Last Year: Not on file  Transportation Needs:   . Lack of Transportation (Medical): Not on file  . Lack of Transportation (Non-Medical): Not on  file  Physical Activity:   . Days of Exercise per Week: Not on file  . Minutes of Exercise per Session: Not on file  Stress:   . Feeling of Stress : Not on file  Social Connections:   . Frequency of Communication with Friends and Family: Not on file  . Frequency of Social Gatherings with Friends and Family: Not on file  . Attends Religious Services: Not on file  . Active Member of Clubs or Organizations: Not on file  . Attends Banker Meetings: Not on file  . Marital Status: Not on file  Intimate Partner Violence:   . Fear of Current or Ex-Partner: Not on file  . Emotionally Abused: Not on file  . Physically Abused: Not on file  . Sexually Abused: Not on file    Family History  Problem Relation Age of Onset  . Heart failure Father   . Healthy Sister   . Healthy Son     ROS- All systems are reviewed and negative except as per the HPI above  Physical Exam: Vitals:   12/01/19 1451  BP: 116/78  Pulse: 85  Weight: 122.7 kg  Height:  (1.651 m)   Wt Readings from Last 3 Encounters:  12/01/19 122.7 kg  11/30/19 122.5 kg  11/18/19 122.5 kg    Labs: Lab Results  Component Value Date   NA 143 11/26/2019   K 4.3 11/26/2019   CL 101 11/26/2019   CO2 24 11/26/2019   GLUCOSE 94 11/26/2019   BUN 14 11/26/2019   CREATININE 0.84 11/26/2019   CALCIUM 9.2 11/26/2019   MG 1.9 08/04/2019   Lab Results  Component Value Date   INR 1.1 07/30/2019   No results found for: CHOL, HDL, LDLCALC, TRIG   GEN- The patient is well appearing, alert and oriented x 3 today.   Head- normocephalic, atraumatic Eyes-  Sclera clear, conjunctiva pink Ears- hearing intact Oropharynx- clear Neck- supple, no JVP Lymph- no cervical lymphadenopathy Lungs- Clear to ausculation bilaterally, normal work of breathing Heart- irregular rate and rhythm, no murmurs, rubs or gallops, PMI not laterally displaced GI- soft, NT, ND, + BS Extremities- soft, no edema appreciated, legs are  large from excess adipose tissue, ropey varicose veins noted, no redness/tenderness  of rt calf MS-  no significant deformity or atrophy Skin- no rash or lesion Psych- euthymic mood, full affect Neuro- strength and sensation are intact  EKG-afib with IRBBB,  at 85 bpm, qrs int 98 ms, qtc 428 ms   Echo -1. Left ventricular ejection fraction, by estimation, is 65 to 70%. The  left ventricle has normal function. The left ventricle has no regional  wall motion abnormalities. There is mild left ventricular hypertrophy.  Left ventricular diastolic function  could not be evaluated.  2. Right ventricular systolic function is low normal. The right  ventricular size is normal. There is normal pulmonary artery systolic  pressure.  3. The aortic valve has been repaired/replaced. Aortic valve  regurgitation is not visualized. There is a 26 mm Edwards Sapien  prosthetic (TAVR) valve present in the aortic position. Echo findings are  consistent with normal structure and function of the  aortic valve prosthesis. Aortic valve area, by VTI measures 1.49 cm.  Aortic valve mean gradient measures 6.5 mmHg. Aortic valve Vmax measures  1.96 m/s. Peak gradient 15 mmHg.  4. The inferior vena cava is normal in size with greater than 50%  respiratory variability, suggesting right atrial pressure of 3 mmHg.  5. The mitral valve is grossly normal. Trivial mitral valve  regurgitation.   Comparison(s): Changes from prior study are noted. 08/04/2019: LVEF 60-65%,  26 mm ES3 valve, mean gradient 10 mmHg, peak gradient 19 mmHg.   FINDINGS  Left Ventricle: Left ventricular ejection fraction, by estimation, is 65  to 70%. The left ventricle has normal function. The left ventricle has no  regional wall motion abnormalities. The left ventricular internal cavity  size was normal in size. There is  mild left ventricular hypertrophy. Left ventricular diastolic function  could not be evaluated due to atrial  fibrillation. Left ventricular  diastolic function could not be evaluated.   Left atrium was reported normal in size but has a LA diameter of 5.80 indicating severely dilated left atrium   Assessment and Plan: 1. Persistent afib since May of this year Well rate controlled  Continue bisoprolol 5 mg bid   Cardioversion yesterday but with just a few minutes of SR I discussed restoring SR with  antiarrythmic's She is unsure if she wants to commit to more regimented drugs as her parkinson drugs demand so much of her time  I feel with her dilated left atrium, obesity,  it may be hard  to restore and maintain SR I feel her shortness of breath is multi factorial, just not afib alone,due to age, weight deconditioning from parkinson's, issues ambulating  I will discuss further with Dr. Johney FrameAllred and then discuss again with pt her options   2. CHA2DS2VAScof 3  Continue  eliquis 5 mg bid  States no missed doses  3. Enlarged LE's  Weight is stable, lower extremities are soft  Pt is c/o more of rt leg aching and seeing more irregularity of her skin I do not appreciate any  edema , but do note ropey varicosities Encouraged  to wear knee high support socks Elevate feet when sitting  Encouraged  to discuss with PCP if she needs a referral for venous insufficiency, had vein stripping 40 yrs ago   I do not see any S/S worrisome for DVT  I will discuss with pt after discussion with Dr. Johney FrameAllred   Addendum- 12/07/19- I reached ou to pt to discuss with pt further options for afib. I heard from  PharmD that Parkinson  meds were not contraindicated  with any of the antiarrythmic's. I feel with the length of her persistent afib and by measurement  severely dilated left atrium, as well as morbid obesity, may make it very difficult to restore  and maintain SR. I feel  that amiodarone or Tikosyn may be the best options. I hesitate with amiodarone as she has thyroid disease at baseline and the possibly of neurological   issues with amio potentially  causing increased tremors or gait issues.  She states that currently her Parkinson's is more of a quality of life issue than  afib. Rate control is also an option. She will entertain tikosyn. I will put her on the tikosyn list which has 10-12 names before her. We are now clear to start readmitting for tikosyn with the covid cases declining. It may take several weeks to work thru the admissions. When it becomes her turn on the list,will discuss with her again to see if she is still interested in pursuing.   Elvina Sidle [facility] [address] [place], [place] [address] [phone]

## 2019-12-03 ENCOUNTER — Encounter (HOSPITAL_COMMUNITY): Payer: Self-pay | Admitting: Cardiology

## 2019-12-07 ENCOUNTER — Other Ambulatory Visit: Payer: Self-pay | Admitting: Neurology

## 2019-12-07 NOTE — Progress Notes (Signed)
Assessment/Plan:   1.  Parkinsons Disease  -increase carbidopa/levodopa 25/100, 2 tablets at 9-10am/2pm/5pm/8pm  -Declined switching to CR version to see if that would help EDS  -Declines dopamine agonist.  2. Urinary frequency  -Has followed with [facility] in the past  3.  A-fib  -On Eliquis  -Had unsuccessful cardioversion in October, 2021.  She is considering another attempt.  Subjective:   Cindy Richmond was seen today in follow up for Parkinsons disease.  My previous records were reviewed prior to todays visit as well as outside records available to me. I have not seen her since February, and that was a video visit. Pt denies falls. Patient was in the hospital in May with new onset atrial fibrillation with rapid ventricular response (noted at the time of a heart catheterization). Patient started on Eliquis. Patient underwent cardioversion in October, but unfortunately that was not successful. No hallucinations.  Mood has been good.  Not exercising.    Current prescribed movement disorder medications: Carbidopa/levodopa 25/100, 2 tablets at 8 AM/2 tablets at noon/1 tablet at 4 PM/1 tablet at 8 PM (but she isn't getting out of bed until 10 and she is taking the 8am and going back to bed)   ALLERGIES:   Allergies  Allergen Reactions  . Nickel     Nickel earrings, ear holes get infected  . Penicillins Other (See Comments)    unknown Did it involve swelling of the face/tongue/throat, SOB, or low BP? Unknown Did it involve sudden or severe rash/hives, skin peeling, or any reaction on the inside of your mouth or nose? Unknown Did you need to seek medical attention at a hospital or doctor's office? Unknown When did it last happen?newborn allergy If all above answers are "NO", may proceed with cephalosporin use.     CURRENT MEDICATIONS:  Outpatient Encounter Medications as of 12/14/2019  Medication Sig  . apixaban (ELIQUIS) 5 MG TABS tablet Take 1 tablet (5 mg  total) by mouth 2 (two) times daily.  Marland Kitchen aspirin 81 MG chewable tablet Chew 1 tablet (81 mg total) by mouth daily.  . bisoprolol-hydrochlorothiazide (ZIAC) 5-6.25 MG tablet Take 1 tablet by mouth daily.  . carbidopa-levodopa (SINEMET IR) 25-100 MG tablet 2 TABLETS AT 8AM/2 TABLETS AT NOON/1 TABLET AT 4 PM/1 TABLET AT 8PM  . cephALEXin (KEFLEX) 500 MG capsule Take 2,000 mg (4 capsules) one hour to all dental visits.  . cholecalciferol (VITAMIN D) 25 MCG (1000 UNIT) tablet Take 1,000 Units by mouth daily.  . Collagen-Boron-Hyaluronic Acid ([facility] JOINT [facility]ALTH TRIPLE ACTION PO) Take 1 tablet by mouth daily. Triple Action Joint Health  . Cyanocobalamin (VITAMIN B-12) 5000 MCG TBDP Take 5,000 mcg by mouth daily.  Marland Kitchen docusate sodium (COLACE) 100 MG capsule Take 200 mg by mouth daily as needed (constipation.).   Marland Kitchen esomeprazole (NEXIUM) 20 MG capsule Take 20 mg by mouth daily.   Marland Kitchen levothyroxine (SYNTHROID, LEVOTHROID) 75 MCG tablet Take 75 mcg by mouth daily before breakfast.   . Menthol-Methyl Salicylate (SALONPAS PAIN RELIEF PATCH) PTCH Place 1 patch onto the skin daily as needed (pain.).  Marland Kitchen Multiple Vitamins-Minerals (PRESERVISION AREDS PO) Take 1 tablet by mouth daily.   . Omega-3 Fatty Acids (FISH OIL) 1200 MG CAPS Take 1,200 mg by mouth daily.  Bertram Gala Glycol-Propyl Glycol (SYSTANE OP) Place 1 drop into both eyes in the morning and at bedtime.   . [DISCONTINUED] carbidopa-levodopa (SINEMET IR) 25-100 MG tablet 2 TABLETS AT 8AM/2 TABLETS AT NOON/1 TABLET AT  4 PM/1 TABLET AT 8PM   No facility-administered encounter medications on file as of 12/14/2019.    Objective:   PHYSICAL EXAMINATION:    VITALS:   Vitals:   12/14/19 1359  BP: 124/80  Pulse: 92  SpO2: 95%  Weight: 272 lb (123.4 kg)  Height: 5\' 5"  (1.651 m)    GEN:  The patient appears stated age and is in NAD. HEENT:  Normocephalic, atraumatic.  The mucous membranes are moist. The superficial temporal arteries are without ropiness  or tenderness. CV:  irreg irreg Lungs:  CTAB Neck/HEME:  There are no carotid bruits bilaterally.  Neurological examination:  Orientation: The patient is alert and oriented x3. Cranial nerves: There is good facial symmetry with mild facial hypomimia. The speech is fluent and clear. Soft palate rises symmetrically and there is no tongue deviation. Hearing is intact to conversational tone. Sensation: Sensation is intact to light touch throughout Motor: Strength is at least antigravity x4.  Movement examination: Tone: There is mod increased tone in the rue Abnormal movements: none Coordination:  There is mild decremation with RAM's, with any form of RAMS, including alternating supination and pronation of the forearm, hand opening and closing, finger taps, heel taps and toe taps, R Gait and Station: The patient has mild difficulty arising out of a deep-seated chair without the use of the hands. Patient pushes off of the chair to arise.  The patient's stride length is mild decreased.  Walks ok with the cane.    I have reviewed and interpreted the following labs independently    Chemistry      Component Value Date/Time   NA 143 11/26/2019 1059   K 4.3 11/26/2019 1059   CL 101 11/26/2019 1059   CO2 24 11/26/2019 1059   BUN 14 11/26/2019 1059   CREATININE 0.84 11/26/2019 1059      Component Value Date/Time   CALCIUM 9.2 11/26/2019 1059   ALKPHOS 71 07/30/2019 0934   AST 14 (L) 07/30/2019 0934   ALT 7 07/30/2019 0934   BILITOT 0.8 07/30/2019 0934       Lab Results  Component Value Date   WBC 9.0 11/26/2019   HGB 15.1 11/26/2019   HCT 49.3 (H) 11/26/2019   MCV 83 11/26/2019   PLT 129 (L) 11/26/2019    Lab Results  Component Value Date   TSH 4.21 10/06/2006     Total time spent on today's visit was 30 minutes, including both face-to-face time and nonface-to-face time.  Time included that spent on review of records (prior notes available to me/labs/imaging if pertinent),  discussing treatment and goals, answering patient's questions and coordinating care.  Cc:  12/06/2006, MD

## 2019-12-07 NOTE — Addendum Note (Signed)
Encounter addended by: Newman Nip, NP on: 12/07/2019 4:53 PM  Actions taken: Clinical Note Signed

## 2019-12-08 ENCOUNTER — Ambulatory Visit: Payer: Medicare PPO | Admitting: Cardiology

## 2019-12-08 NOTE — Telephone Encounter (Signed)
Rx(s) sent to pharmacy electronically.  

## 2019-12-09 ENCOUNTER — Other Ambulatory Visit: Payer: Self-pay | Admitting: Family Medicine

## 2019-12-09 DIAGNOSIS — Z1231 Encounter for screening mammogram for malignant neoplasm of breast: Secondary | ICD-10-CM

## 2019-12-10 ENCOUNTER — Telehealth: Payer: Self-pay | Admitting: Pharmacist

## 2019-12-10 NOTE — Telephone Encounter (Signed)
Medication list reviewed in anticipation of upcoming Tikosyn initiation. Patient is taking HCTZ in a combo pill with bisoprolol; HCTZ will need to be discontinued as it's contraindicated with Tikosyn. No other major drug interactions noted.  Patient is anticoagulated on Eliquis 5mg  BID on the appropriate dose. Please ensure that patient has not missed any anticoagulation doses in the 3 weeks prior to Tikosyn initiation.   Patient will need to be counseled to avoid use of Benadryl while on Tikosyn and in the 2-3 days prior to Tikosyn initiation.

## 2019-12-14 ENCOUNTER — Other Ambulatory Visit: Payer: Self-pay

## 2019-12-14 ENCOUNTER — Ambulatory Visit: Payer: Medicare PPO | Admitting: Neurology

## 2019-12-14 ENCOUNTER — Encounter: Payer: Self-pay | Admitting: Neurology

## 2019-12-14 VITALS — BP 124/80 | HR 92 | Ht 65.0 in | Wt 272.0 lb

## 2019-12-14 DIAGNOSIS — G20 Parkinson's disease: Secondary | ICD-10-CM | POA: Diagnosis not present

## 2019-12-14 MED ORDER — CARBIDOPA-LEVODOPA 25-100 MG PO TABS
ORAL_TABLET | ORAL | 0 refills | Status: DC
Start: 2019-12-14 — End: 2020-01-04

## 2019-12-14 NOTE — Patient Instructions (Addendum)
increase carbidopa/levodopa 25/100, 2 tablets at 9-10am/2 tablets at 2pm/2 tablets at 5pm/2 tablets at 8pm  The physicians and staff at [facility] are committed to providing excellent care. You may receive a survey requesting feedback about your experience at our office. We strive to receive "very good" responses to the survey questions. If you feel that your experience would prevent you from giving the office a "very good " response, please contact our office to try to remedy the situation. We may be reached at [phone]. Thank you for taking the time out of your busy day to complete the survey.

## 2019-12-16 ENCOUNTER — Ambulatory Visit: Payer: Medicare PPO | Admitting: Cardiology

## 2019-12-17 ENCOUNTER — Encounter: Payer: Self-pay | Admitting: Physician Assistant

## 2019-12-17 ENCOUNTER — Other Ambulatory Visit: Payer: Self-pay

## 2019-12-17 ENCOUNTER — Ambulatory Visit: Payer: Medicare PPO | Admitting: Physician Assistant

## 2019-12-17 VITALS — BP 129/87 | HR 94 | Ht 65.0 in | Wt 273.0 lb

## 2019-12-17 DIAGNOSIS — Z953 Presence of xenogenic heart valve: Secondary | ICD-10-CM | POA: Diagnosis not present

## 2019-12-17 DIAGNOSIS — I4819 Other persistent atrial fibrillation: Secondary | ICD-10-CM

## 2019-12-17 DIAGNOSIS — I10 Essential (primary) hypertension: Secondary | ICD-10-CM

## 2019-12-17 DIAGNOSIS — E785 Hyperlipidemia, unspecified: Secondary | ICD-10-CM

## 2019-12-17 NOTE — Patient Instructions (Addendum)
Medication Instructions:  No changes *If you need a refill on your cardiac medications before your next appointment, please call your pharmacy*   Lab Work: No labs If you have labs (blood work) drawn today and your tests are completely normal, you will receive your results only by:  MyChart Message (if you have MyChart) OR  A paper copy in the mail If you have any lab test that is abnormal or we need to change your treatment, we will call you to review the results.   Testing/Procedures: None   Follow-Up: At [facility], you and your health needs are our priority.  As part of our continuing mission to provide you with exceptional heart care, we have created designated Provider Care Teams.  These Care Teams include your primary Cardiologist (physician) and Advanced Practice Providers (APPs -  Physician Assistants and Nurse Practitioners) who all work together to provide you with the care you need, when you need it.  We recommend signing up for the patient portal called "MyChart".  Sign up information is provided on this After Visit Summary.  MyChart is used to connect with patients for Virtual Visits (Telemedicine).  Patients are able to view lab/test results, encounter notes, upcoming appointments, etc.  Non-urgent messages can be sent to your provider as well.   To learn more about what you can do with MyChart, go to [email or web address].    Your next appointment:   After Afib clinic appointment   The format for your next appointment:   In Person  Provider:   [name], MD   Other Instructions Referral requested for Afib clinic and Nutritionist

## 2019-12-17 NOTE — Progress Notes (Signed)
   Primary Cardiologist: [name], MD  Chart reviewed as part of pre-operative protocol coverage.   Simple dental extractions (1-2 teeth), crown and dental cleaning are considered low risk procedures per guidelines and generally do not require any specific cardiac clearance. It is also generally accepted that for simple extractions and dental cleanings, there is no need to interrupt blood thinner therapy.  If more than 2 teeth need extraction, please inform cardiology service so we can make recommendation regarding Eliquis  SBE prophylaxis is required for the patient from a cardiac standpoint. We have previously sent Cephalexin 500 mg to her pharmacy with instruction of taking 4 capsules (total of 2000 mg) one hour prior to dental cleaning, extractions or other procedures.  I will route this recommendation to the requesting party via Epic fax function and remove from pre-op pool.  Please call with questions.  [place], [place] 12/17/2019, 2:56 PM

## 2019-12-17 NOTE — Progress Notes (Signed)
Cardiology Office Note:    Date:  12/19/2019   ID:  Cindy Richmond, Cindy Richmond [birth year], MRN [id number]  PCP:  Mila Palmer, MD  [facility] Cardiologist:  [name], MD  [facility] Electrophysiologist:  None   Referring MD: Mila Palmer, MD   Chief Complaint  Patient presents with  . Follow-up    seen for Dr. Jens Som    History of Present Illness:    Cindy Richmond is a 69 y.o. female with a hx of HTN, HLD, hypothyroidism, Parkinson's disease, atrial fibrillation and severe aortic stenosis s/p recent TAVR.  Carotid Doppler obtained on 05/11/2019 showed no carotid artery disease.  Echocardiogram obtained in March 2021 showed normal EF, severe aortic stenosis with gradient 42 mmHg.  She had symptomatic aortic stenosis with significant dyspnea on exertion.  Cardiac catheterization performed on 07/05/2019 showed no angiographic evidence of CAD, severe aortic stenosis with mean gradient 28.6 mmHg, AVA 0.9 cm.  At the time of her cath, she was found to be in new atrial fibrillation and has been placed on Eliquis.  Pre-TAVR CT showed mild right paratracheal lymphadenopathy that require no follow-up unless patient has a history of malignancy, he also revealed a subcentimeter hypodense left liver lesion that was too small to characterize and also requires no follow-up unless she has risk for liver malignancy. She eventually underwent TAVR procedure with placement of Edwards Sapien 3 Ultra THV size 57mm on 6/8 by Dr. Cornelius Moras and Dr. Clifton James.  Postop limited echocardiogram obtained on 08/04/2019 showed EF 60 to 65%, stable TAVR valve without paravalvular leak.  Repeat echocardiogram continue to show normal functioning bioprosthetic valve on 09/07/2019.  She was last seen by Cline Crock of valve clinic on 09/07/2019, at the time outpatient cardioversion has been discussed with the patient however prefer to hold off on the procedure at the time given how well she felt.  She eventually underwent  cardioversion on 11/30/2019 however was only able to hold sinus rhythm for a few minutes.  She was seen in the A. fib clinic on 12/01/2019, various option has been discussed with the patient who eventually decided with Tikosyn loading.  She is currently on the wait list.  Talking with the patient today, she is not sure if she will proceed with the Tikosyn loading.  We gave her 2 different options, #1 option would be Tikosyn loading with cardioversion back to sinus rhythm, second option would be to stay in atrial fibrillation for the rest of her life.  She is aware that the longer she stays in A. fib, the more difficult it is to come out of A. fib in the future.  She still has a decided at this time.  I made her aware that hydrochlorothiazide will be contraindicated if she does start on the Tikosyn.  She wished to continue the bisoprolol-hydrochlorothiazide for the time being until she make up her mind regarding the Tikosyn.  She is requesting a nutritionist to help her with weight loss and diet.  I also wrote her a note to clear her for upcoming dental cleaning and examination.  Otherwise she can follow-up with the A. fib clinic in 4 to 6 weeks.  She appears to be euvolemic on physical exam.  Past Medical History:  Diagnosis Date  . Atrial fibrillation, persistent (HCC)   . Dyspnea    with head low, sleep apnea  . Dysrhythmia    afib  . GERD (gastroesophageal reflux disease)   . H/O hiatal hernia   .  Hematuria   . History of kidney stones   . HOH (hard of hearing)    sightly, bilaterally  . Hyperlipidemia   . Hypertension   . Hypothyroidism   . Parkinson disease (HCC)   . S/P TAVR (transcatheter aortic valve replacement) 08/03/2019   s/p TAVR with 26 mm Edwards S3U via the TF approach by Drs Aundra Dubin & Cornelius Moras  . Severe aortic stenosis   . Sleep apnea     Past Surgical History:  Procedure Laterality Date  . ABDOMINAL HYSTERECTOMY     lso  . CARDIOVERSION N/A 11/30/2019   Procedure:  CARDIOVERSION;  Surgeon: Lewayne Bunting, MD;  Location: [facility] ENDOSCOPY;  Service: Cardiovascular;  Laterality: N/A;  . CYSTOSCOPY W/ RETROGRADES  02/28/2012   Procedure: CYSTOSCOPY WITH RETROGRADE PYELOGRAM;  Surgeon: Sebastian Ache, MD;  Location: [facility];  Service: Urology;  Laterality: Right;  . CYSTOSCOPY WITH STENT PLACEMENT  02/28/2012   Procedure: CYSTOSCOPY WITH STENT PLACEMENT;  Surgeon: Sebastian Ache, MD;  Location: [facility];  Service: Urology;  Laterality: Left;  . CYSTOSCOPY/RETROGRADE/URETEROSCOPY/STONE EXTRACTION WITH BASKET  02/28/2012   Procedure: CYSTOSCOPY/RETROGRADE/URETEROSCOPY/STONE EXTRACTION WITH BASKET;  Surgeon: Sebastian Ache, MD;  Location: [facility];  Service: Urology;  Laterality: Left;  . HOLMIUM LASER APPLICATION  02/28/2012   Procedure: HOLMIUM LASER APPLICATION;  Surgeon: Sebastian Ache, MD;  Location: [facility];  Service: Urology;  Laterality: Left;  . INTRAOPERATIVE TRANSTHORACIC ECHOCARDIOGRAM  08/03/2019   Procedure: Intraoperative Transthoracic Echocardiogram;  Surgeon: Kathleene Hazel, MD;  Location: [facility] OR;  Service: Open Heart Surgery;;  . KNEE ARTHROSCOPY     right  . REVERSE SHOULDER ARTHROPLASTY Left 07/22/2018   Procedure: REVERSE SHOULDER ARTHROPLASTY;  Surgeon: Bjorn Pippin, MD;  Location: [facility] ORS;  Service: Orthopedics;  Laterality: Left;  . RIGHT/LEFT HEART CATH AND CORONARY ANGIOGRAPHY N/A 07/05/2019   Procedure: RIGHT/LEFT HEART CATH AND CORONARY ANGIOGRAPHY;  Surgeon: Kathleene Hazel, MD;  Location: [facility] INVASIVE CV LAB;  Service: Cardiovascular;  Laterality: N/A;  . TONSILLECTOMY    . TRANSCATHETER AORTIC VALVE REPLACEMENT, TRANSFEMORAL N/A 08/03/2019   Procedure: TRANSCATHETER AORTIC VALVE REPLACEMENT, TRANSFEMORAL;  Surgeon: Kathleene Hazel, MD;  Location: [facility] OR;  Service: Open Heart Surgery;  Laterality: N/A;  . VEIN LIGATION      Current Medications: Current  Meds  Medication Sig  . apixaban (ELIQUIS) 5 MG TABS tablet Take 1 tablet (5 mg total) by mouth 2 (two) times daily.  Marland Kitchen aspirin 81 MG chewable tablet Chew 1 tablet (81 mg total) by mouth daily.  . bisoprolol-hydrochlorothiazide (ZIAC) 5-6.25 MG tablet Take 1 tablet by mouth daily.  . carbidopa-levodopa (SINEMET IR) 25-100 MG tablet 2 tablets at 9-10am/2pm/5pm/8pm  . cephALEXin (KEFLEX) 500 MG capsule Take 2,000 mg (4 capsules) one hour to all dental visits.  . cholecalciferol (VITAMIN D) 25 MCG (1000 UNIT) tablet Take 1,000 Units by mouth daily.  . Collagen-Boron-Hyaluronic Acid ([employer] JOINT HEALTH TRIPLE ACTION PO) Take 1 tablet by mouth daily. Triple Action Joint Health  . Cyanocobalamin (VITAMIN B-12) 5000 MCG TBDP Take 5,000 mcg by mouth daily.  Marland Kitchen docusate sodium (COLACE) 100 MG capsule Take 200 mg by mouth daily as needed (constipation.).   Marland Kitchen esomeprazole (NEXIUM) 20 MG capsule Take 20 mg by mouth daily.   Marland Kitchen levothyroxine (SYNTHROID, LEVOTHROID) 75 MCG tablet Take 75 mcg by mouth daily before breakfast.   . Menthol-Methyl Salicylate (SALONPAS PAIN RELIEF PATCH) PTCH Place 1 patch onto the skin daily  as needed (pain.).  Marland Kitchen. Multiple Vitamins-Minerals (PRESERVISION AREDS PO) Take 1 tablet by mouth daily.   . Omega-3 Fatty Acids (FISH OIL) 1200 MG CAPS Take 1,200 mg by mouth daily.  Bertram Gala. Polyethyl Glycol-Propyl Glycol (SYSTANE OP) Place 1 drop into both eyes in the morning and at bedtime.      Allergies:   Nickel and Penicillins   Social History   Socioeconomic History  . Marital status: Widowed    Spouse name: Not on file  . Number of children: 3  . Years of education: Not on file  . Highest education level: Associate degree: occupational, Scientist, product/process developmenttechnical, or vocational program  Occupational History  . 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 Smoking status: Never Smoker  . Smokeless tobacco: Never Used  Vaping Use  . Vaping Use: Never used  Substance and Sexual  Activity  . Alcohol use: Never  . Drug use: Never  . Sexual activity: Not on file  Other Topics Concern  . Not on file  Social History Narrative  . Not on file   Social Determinants of Health   Financial Resource Strain:   . Difficulty of Paying Living Expenses: Not on file  Food Insecurity:   . Worried About Programme researcher, broadcasting/film/videounning Out of Food in the Last Year: Not on file  . Ran Out of Food in the Last Year: Not on file  Transportation Needs:   . Lack of Transportation (Medical): Not on file  . Lack of Transportation (Non-Medical): Not on file  Physical Activity:   . Days of Exercise per Week: Not on file  . Minutes of Exercise per Session: Not on file  Stress:   . Feeling of Stress : Not on file  Social Connections:   . Frequency of Communication with Friends and Family: Not on file  . Frequency of Social Gatherings with Friends and Family: Not on file  . Attends Religious Services: Not on file  . Active Member of Clubs or Organizations: Not on file  . Attends BankerClub or Organization Meetings: Not on file  . Marital Status: Not on file     Family History: The patient's family history includes Healthy in her sister and son; Heart failure in her father.  ROS:   Please see the history of present illness.     All other systems reviewed and are negative.  EKGs/Labs/Other Studies Reviewed:    The following studies were reviewed today:  Echo 09/09/2019 1. Left ventricular ejection fraction, by estimation, is 65 to 70%. The  left ventricle has normal function. The left ventricle has no regional  wall motion abnormalities. There is mild left ventricular hypertrophy.  Left ventricular diastolic function  could not be evaluated.  2. Right ventricular systolic function is low normal. The right  ventricular size is normal. There is normal pulmonary artery systolic  pressure.  3. The aortic valve has been repaired/replaced. Aortic valve  regurgitation is not visualized. There is a 26 mm  Edwards Sapien  prosthetic (TAVR) valve present in the aortic position. Echo findings are  consistent with normal structure and function of the  aortic valve prosthesis. Aortic valve area, by VTI measures 1.49 cm.  Aortic valve mean gradient measures 6.5 mmHg. Aortic valve Vmax measures  1.96 m/s. Peak gradient 15 mmHg.  4. The inferior vena cava is normal in size with greater than 50%  respiratory variability, suggesting right atrial pressure of 3 mmHg.  5. The mitral valve is grossly normal.  Trivial mitral valve  regurgitation.   Comparison(s): Changes from prior study are noted. 08/04/2019: LVEF 60-65%,  26 mm ES3 valve, mean gradient 10 mmHg, peak gradient 19 mmHg.    EKG:  EKG is ordered today.  The ekg ordered today demonstrates atrial fibrillation, heart rate 94 bpm.  Incomplete right bundle branch block.  Recent Labs: 07/30/2019: ALT 7; B Natriuretic Peptide 209.1 08/04/2019: Magnesium 1.9 11/26/2019: BUN 14; Creatinine, Ser 0.84; Hemoglobin 15.1; Platelets 129; Potassium 4.3; Sodium 143  Recent Lipid Panel No results found for: CHOL, TRIG, HDL, CHOLHDL, VLDL, LDLCALC, LDLDIRECT   Risk Assessment/Calculations:     CHA2DS2-VASc Score = 3  This indicates a 3.2% annual risk of stroke. The patient's score is based upon: CHF History: 0 HTN History: 1 Diabetes History: 0 Stroke History: 0 Vascular Disease History: 0 Age Score: 1 Gender Score: 1      Physical Exam:    VS:  BP 129/87   Pulse 94   Ht  (1.651 m)   Wt 273 lb (123.8 kg)   SpO2 94%   BMI 45.43 kg/m     Wt Readings from Last 3 Encounters:  12/17/19 273 lb (123.8 kg)  12/14/19 272 lb (123.4 kg)  12/01/19 270 lb 9.6 oz (122.7 kg)     GEN:  Well nourished, well developed in no acute distress HEENT: Normal NECK: No JVD; No carotid bruits LYMPHATICS: No lymphadenopathy CARDIAC: RRR, no murmurs, rubs, gallops RESPIRATORY:  Clear to auscultation without rales, wheezing or rhonchi  ABDOMEN: Soft,  non-tender, non-distended MUSCULOSKELETAL:  No edema; No deformity  SKIN: Warm and dry NEUROLOGIC:  Alert and oriented x 3 PSYCHIATRIC:  Normal affect   ASSESSMENT:    1. Persistent atrial fibrillation (HCC)   2. S/p TAVR (transcatheter aortic valve replacement), bioprosthetic   3. Essential hypertension   4. Hyperlipidemia LDL goal <100   5. Obesity, morbid (HCC)    PLAN:    In order of problems listed above:  1. Persistent atrial fibrillation: On Eliquis, rate controlled on bisoprolol-hydrochlorothiazide.  She had recurrent A. fib shortly after the last cardioversion.  Since then, she has been seen by A. fib clinic and learned about Tikosyn loading.  Although she displayed some interest during the A. fib clinic, today she is unsure about her choices.  She is aware that at this point she has 2 choices, one is to proceed with the Tikosyn loading and discontinue hydrochlorothiazide due to contraindication, the other option is to stay in A. Fib.  2. History of TAVR: Stable on last echocardiogram  3. Hypertension: Blood pressure stable  4. Hyperlipidemia: On fish oil.  5. Morbid obesity: She is quite interested in seeing a dietitian.  We will make a referral.   Medication Adjustments/Labs and Tests Ordered: Current medicines are reviewed at length with the patient today.  Concerns regarding medicines are outlined above.  Orders Placed This Encounter  Procedures  . Amb Referral to AFIB Clinic  . Ambulatory Referral to DSME/T  . EKG 12-Lead   No orders of the defined types were placed in this encounter.   Patient Instructions  Medication Instructions:  No changes *If you need a refill on your cardiac medications before your next appointment, please call your pharmacy*   Lab Work: No labs If you have labs (blood work) drawn today and your tests are completely normal, you will receive your results only by: Marland Kitchen MyChart Message (if you have MyChart) OR . A paper copy in the  mail If you have any lab test that is abnormal or we need to change your treatment, we will call you to review the results.   Testing/Procedures: None   Follow-Up: At [facility], you and your health needs are our priority.  As part of our continuing mission to provide you with exceptional heart care, we have created designated Provider Care Teams.  These Care Teams include your primary Cardiologist (physician) and Advanced Practice Providers (APPs -  Physician Assistants and Nurse Practitioners) who all work together to provide you with the care you need, when you need it.  We recommend signing up for the patient portal called "MyChart".  Sign up information is provided on this After Visit Summary.  MyChart is used to connect with patients for Virtual Visits (Telemedicine).  Patients are able to view lab/test results, encounter notes, upcoming appointments, etc.  Non-urgent messages can be sent to your provider as well.   To learn more about what you can do with MyChart, go to [email or web address].    Your next appointment:   After Afib clinic appointment   The format for your next appointment:   In Person  Provider:   [name], MD   Other Instructions Referral requested for Afib clinic and Nutritionist    Signed, Azalee Course, [place]  12/19/2019 11:18 PM    [facility]

## 2019-12-19 ENCOUNTER — Encounter: Payer: Self-pay | Admitting: Physician Assistant

## 2020-01-04 ENCOUNTER — Other Ambulatory Visit: Payer: Self-pay | Admitting: Neurology

## 2020-01-07 ENCOUNTER — Other Ambulatory Visit: Payer: Self-pay

## 2020-01-07 ENCOUNTER — Ambulatory Visit
Admission: RE | Admit: 2020-01-07 | Discharge: 2020-01-07 | Disposition: A | Discharge status: Home / Self Care | Payer: Medicare PPO | Source: Ambulatory Visit | Attending: Family Medicine | Admitting: Family Medicine

## 2020-01-07 DIAGNOSIS — Z1231 Encounter for screening mammogram for malignant neoplasm of breast: Secondary | ICD-10-CM

## 2020-01-13 ENCOUNTER — Other Ambulatory Visit: Payer: Self-pay | Admitting: Family Medicine

## 2020-01-13 DIAGNOSIS — R928 Other abnormal and inconclusive findings on diagnostic imaging of breast: Secondary | ICD-10-CM

## 2020-01-18 ENCOUNTER — Ambulatory Visit (HOSPITAL_COMMUNITY)
Admission: RE | Admit: 2020-01-18 | Discharge: 2020-01-18 | Disposition: A | Discharge status: Home / Self Care | Payer: Medicare PPO | Source: Ambulatory Visit | Attending: Nurse Practitioner | Admitting: Nurse Practitioner

## 2020-01-18 ENCOUNTER — Other Ambulatory Visit: Payer: Self-pay

## 2020-01-18 VITALS — BP 150/90 | HR 90 | Ht 65.0 in | Wt 273.0 lb

## 2020-01-18 DIAGNOSIS — I110 Hypertensive heart disease with heart failure: Secondary | ICD-10-CM | POA: Diagnosis not present

## 2020-01-18 DIAGNOSIS — D6869 Other thrombophilia: Secondary | ICD-10-CM

## 2020-01-18 DIAGNOSIS — Z7982 Long term (current) use of aspirin: Secondary | ICD-10-CM | POA: Diagnosis not present

## 2020-01-18 DIAGNOSIS — R0602 Shortness of breath: Secondary | ICD-10-CM | POA: Diagnosis not present

## 2020-01-18 DIAGNOSIS — I350 Nonrheumatic aortic (valve) stenosis: Secondary | ICD-10-CM | POA: Diagnosis not present

## 2020-01-18 DIAGNOSIS — E039 Hypothyroidism, unspecified: Secondary | ICD-10-CM | POA: Insufficient documentation

## 2020-01-18 DIAGNOSIS — Z7901 Long term (current) use of anticoagulants: Secondary | ICD-10-CM | POA: Diagnosis not present

## 2020-01-18 DIAGNOSIS — I4819 Other persistent atrial fibrillation: Secondary | ICD-10-CM | POA: Diagnosis not present

## 2020-01-18 DIAGNOSIS — I5030 Unspecified diastolic (congestive) heart failure: Secondary | ICD-10-CM | POA: Insufficient documentation

## 2020-01-18 DIAGNOSIS — Z79899 Other long term (current) drug therapy: Secondary | ICD-10-CM | POA: Diagnosis not present

## 2020-01-18 NOTE — Progress Notes (Signed)
Primary Care Physician: Mila Palmer, MD Referring Physician: Dr. Donzetta Starch Cindy Richmond is a 69 y.o. female with a h/o morbid obesity,hypothyroidism,  severe aortic stenosis,  s/p TAVR 08/15/19, OSA, diastolic  HF, found to be in afib with RVR at time  of catheterization in May 2021. She did not have evidence of CAD. She was started on eliquis, was offered a cardioversion in July, when seen by Carlean Jews, PA, which she declined as she felt she was doing ok. Echo at that time EF 60%, normally functioning TAVR with a mean gradient of 6.5 mmHg and no PVL. She had NYHA class I symptoms. She had a cardioversion yesterday and only had a few minutes of SR.    She was referred to afib clinic to discuss antiarrythmic's. She also called the [facility] cardiology office yesterday to c/o leg swelling of the rt leg.  She states that overall she feels ok. She does  walk with a cane and her mobility/QOL  is limited by her Parkinson's diease. She feels her shortness of breath did improve to some degree after the procedure but she still has shortness of breath with exertion.  She is c/o of seeing more irregularity of her skin of her rt leg and aching of that leg. Both legs are very soft, no fluid appreciated,  but large with a lot of adipose tissue and  ropey varicose veins. There is a lot of spider veins noted at the ankle area. Her weight is exactly the same for months now so I do not think she has excess fluid. She did have her veins stripped 40 years ago. She has knee high support socks but does not wear them. There is no redness, tenderness, pain of rt calf and being on anticoagulation without any missed doses , makes me have low suspicion for a DVT.  She is rate controlled. She continues on eliquis with a CHA2DS2VASc score of 3.  F/u 01/18/20. She  is here in the afib clinic to further discuss restoring SR. I discussed with her on last visit. She  was undecided so I brought her back to further discuss.  Now, she has ha a questionable mammogram and has to have a repeat test next week. She cannot make any decisions re her heart right now until she knows the outcome of the breast u/s. She continues to be rate controlled with afib, continues to feel fatigued.   Today, she denies symptoms of palpitations, chest pain, + for shortness of breath, - for  orthopnea, PND, no significant  lower extremity edema noted, no  dizziness, presyncope, syncope, or neurologic sequela. The patient is tolerating medications without difficulties and is otherwise without complaint today.   Past Medical History:  Diagnosis Date  . Atrial fibrillation, persistent (HCC)   . Dyspnea    with head low, sleep apnea  . Dysrhythmia    afib  . GERD (gastroesophageal reflux disease)   . H/O hiatal hernia   . Hematuria   . History of kidney stones   . HOH (hard of hearing)    sightly, bilaterally  . Hyperlipidemia   . Hypertension   . Hypothyroidism   . Parkinson disease (HCC)   . S/P TAVR (transcatheter aortic valve replacement) 08/03/2019   s/p TAVR with 26 mm Edwards S3U via the TF approach by Drs Aundra Dubin & Cornelius Moras  . Severe aortic stenosis   . Sleep apnea    Past Surgical History:  Procedure Laterality Date  .  ABDOMINAL HYSTERECTOMY     lso  . CARDIOVERSION N/A 11/30/2019   Procedure: CARDIOVERSION;  Surgeon: Lewayne Buntingrenshaw, Brian S, MD;  [facility];  Service: Cardiovascular;  Laterality: N/A;  . CYSTOSCOPY W/ RETROGRADES  02/28/2012   Procedure: CYSTOSCOPY WITH RETROGRADE PYELOGRAM;  Surgeon: Sebastian Acheheodore Manny, MD;  [facility] [place];  Service: Urology;  Laterality: Right;  . CYSTOSCOPY WITH STENT PLACEMENT  02/28/2012   Procedure: CYSTOSCOPY WITH STENT PLACEMENT;  Surgeon: Sebastian Acheheodore Manny, MD;  [facility] [place];  Service: Urology;  Laterality: Left;  . CYSTOSCOPY/RETROGRADE/URETEROSCOPY/STONE EXTRACTION WITH BASKET  02/28/2012   Procedure:  CYSTOSCOPY/RETROGRADE/URETEROSCOPY/STONE EXTRACTION WITH BASKET;  Surgeon: Sebastian Acheheodore Manny, MD;  [facility] [place];  Service: Urology;  Laterality: Left;  . HOLMIUM LASER APPLICATION  02/28/2012   Procedure: HOLMIUM LASER APPLICATION;  Surgeon: Sebastian Acheheodore Manny, MD;  [facility] [place];  Service: Urology;  Laterality: Left;  . INTRAOPERATIVE TRANSTHORACIC ECHOCARDIOGRAM  08/03/2019   Procedure: Intraoperative Transthoracic Echocardiogram;  Surgeon: Kathleene HazelMcAlhany, Christopher D, MD;  [facility];  Service: Open Heart Surgery;;  . KNEE ARTHROSCOPY     right  . REVERSE SHOULDER ARTHROPLASTY Left 07/22/2018   Procedure: REVERSE SHOULDER ARTHROPLASTY;  Surgeon: Bjorn PippinVarkey, Dax T, MD;  Location: [facility] ORS;  Service: Orthopedics;  Laterality: Left;  . RIGHT/LEFT HEART CATH AND CORONARY ANGIOGRAPHY N/A 07/05/2019   Procedure: RIGHT/LEFT HEART CATH AND CORONARY ANGIOGRAPHY;  Surgeon: Kathleene HazelMcAlhany, Christopher D, MD;  Location: [facility] INVASIVE CV LAB;  Service: Cardiovascular;  Laterality: N/A;  . TONSILLECTOMY    . TRANSCATHETER AORTIC VALVE REPLACEMENT, TRANSFEMORAL N/A 08/03/2019   Procedure: TRANSCATHETER AORTIC VALVE REPLACEMENT, TRANSFEMORAL;  Surgeon: Kathleene HazelMcAlhany, Christopher D, MD;  Location: [facility] OR;  Service: Open Heart Surgery;  Laterality: N/A;  . VEIN LIGATION      Current Outpatient Medications  Medication Sig Dispense Refill  . apixaban (ELIQUIS) 5 MG TABS tablet Take 1 tablet (5 mg total) by mouth 2 (two) times daily. 60 tablet 6  . aspirin 81 MG chewable tablet Chew 1 tablet (81 mg total) by mouth daily.    . bisoprolol-hydrochlorothiazide (ZIAC) 5-6.25 MG tablet Take 1 tablet by mouth daily.    . carbidopa-levodopa (SINEMET IR) 25-100 MG tablet TAKE 2 TABLETS AT 8AM, 2 TABLETS AT NOON, 1 TABLET AT 4PM, AND 1 TABLET AT 8PM 180 tablet 1  . cephALEXin (KEFLEX) 500 MG capsule Take 2,000 mg (4 capsules) one hour to all dental visits. 8 capsule 11  . cholecalciferol (VITAMIN D) 25 MCG  (1000 UNIT) tablet Take 1,000 Units by mouth daily.    . Collagen-Boron-Hyaluronic Acid ([facility] JOINT HEALTH TRIPLE ACTION PO) Take 1 tablet by mouth daily. Triple Action Joint Health    . Cyanocobalamin (VITAMIN B-12) 5000 MCG TBDP Take 5,000 mcg by mouth daily.    Marland Kitchen. docusate sodium (COLACE) 100 MG capsule Take 200 mg by mouth daily as needed (constipation.).     Marland Kitchen. esomeprazole (NEXIUM) 20 MG capsule Take 20 mg by mouth daily.     Marland Kitchen. levothyroxine (SYNTHROID, LEVOTHROID) 75 MCG tablet Take 75 mcg by mouth daily before breakfast.     . Menthol-Methyl Salicylate (SALONPAS PAIN RELIEF PATCH) PTCH Place 1 patch onto the skin daily as needed (pain.).    Marland Kitchen. Multiple Vitamins-Minerals (PRESERVISION AREDS PO) Take 1 tablet by mouth daily.     . Omega-3 Fatty Acids (FISH OIL) 1200 MG CAPS Take 1,200 mg by mouth daily.    Bertram Gala. Polyethyl Glycol-Propyl Glycol (SYSTANE OP) Place 1  drop into both eyes in the morning and at bedtime.      No current facility-administered medications for this encounter.    Allergies  Allergen Reactions  . Nickel     Nickel earrings, ear holes get infected  . Penicillins Other (See Comments)    unknown Did it involve swelling of the face/tongue/throat, SOB, or low BP? Unknown Did it involve sudden or severe rash/hives, skin peeling, or any reaction on the inside of your mouth or nose? Unknown Did you need to seek medical attention at a hospital or doctor's office? Unknown When did it last happen?newborn allergy If all above answers are "NO", may proceed with cephalosporin use.     Social History   Socioeconomic History  . Marital status: Widowed    Spouse name: Not on file  . Number of children: 3  . Years of education: Not on file  . Highest education level: Associate degree: occupational, Scientist, product/process development, or vocational program  Occupational History  . 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 Smoking status: Never Smoker  . Smokeless  tobacco: Never Used  Vaping Use  . Vaping Use: Never used  Substance and Sexual Activity  . Alcohol use: Never  . Drug use: Never  . Sexual activity: Not on file  Other Topics Concern  . Not on file  Social History Narrative  . Not on file   Social Determinants of Health   Financial Resource Strain:   . Difficulty of Paying Living Expenses: Not on file  Food Insecurity:   . Worried About Programme researcher, broadcasting/film/video in the Last Year: Not on file  . Ran Out of Food in the Last Year: Not on file  Transportation Needs:   . Lack of Transportation (Medical): Not on file  . Lack of Transportation (Non-Medical): Not on file  Physical Activity:   . Days of Exercise per Week: Not on file  . Minutes of Exercise per Session: Not on file  Stress:   . Feeling of Stress : Not on file  Social Connections:   . Frequency of Communication with Friends and Family: Not on file  . Frequency of Social Gatherings with Friends and Family: Not on file  . Attends Religious Services: Not on file  . Active Member of Clubs or Organizations: Not on file  . Attends Banker Meetings: Not on file  . Marital Status: Not on file  Intimate Partner Violence:   . Fear of Current or Ex-Partner: Not on file  . Emotionally Abused: Not on file  . Physically Abused: Not on file  . Sexually Abused: Not on file    Family History  Problem Relation Age of Onset  . Heart failure Father   . Healthy Sister   . Healthy Son     ROS- All systems are reviewed and negative except as per the HPI above  Physical Exam: Vitals:   01/18/20 1534  BP: (!) 150/90  Pulse: 90  Weight: 123.8 kg  Height: 5\' 5"  (1.651 m)   Wt Readings from Last 3 Encounters:  01/18/20 123.8 kg  12/17/19 123.8 kg  12/14/19 123.4 kg    Labs: Lab Results  Component Value Date   NA 143 11/26/2019   K 4.3 11/26/2019   CL 101 11/26/2019   CO2 24 11/26/2019   GLUCOSE 94 11/26/2019   BUN 14 11/26/2019   CREATININE 0.84 11/26/2019     CALCIUM 9.2 11/26/2019   MG 1.9  08/04/2019   Lab Results  Component Value Date   INR 1.1 07/30/2019   No results found for: CHOL, HDL, LDLCALC, TRIG   GEN- The patient is well appearing, alert and oriented x 3 today.   Head- normocephalic, atraumatic Eyes-  Sclera clear, conjunctiva pink Ears- hearing intact Oropharynx- clear Neck- supple, no JVP Lymph- no cervical lymphadenopathy Lungs- Clear to ausculation bilaterally, normal work of breathing Heart- irregular rate and rhythm, no murmurs, rubs or gallops, PMI not laterally displaced GI- soft, NT, ND, + BS Extremities- soft, no edema appreciated, legs are large from excess adipose tissue, ropey varicose veins noted, no redness/tenderness  of rt calf MS- no significant deformity or atrophy Skin- no rash or lesion Psych- euthymic mood, full affect Neuro- strength and sensation are intact  EKG-afib with IRBBB, at 90  bpm, qrs int 90 ms, qtc 430  ms   Echo -1. Left ventricular ejection fraction, by estimation, is 65 to 70%. The  left ventricle has normal function. The left ventricle has no regional  wall motion abnormalities. There is mild left ventricular hypertrophy.  Left ventricular diastolic function  could not be evaluated.  2. Right ventricular systolic function is low normal. The right  ventricular size is normal. There is normal pulmonary artery systolic  pressure.  3. The aortic valve has been repaired/replaced. Aortic valve  regurgitation is not visualized. There is a 26 mm Edwards Sapien  prosthetic (TAVR) valve present in the aortic position. Echo findings are  consistent with normal structure and function of the  aortic valve prosthesis. Aortic valve area, by VTI measures 1.49 cm.  Aortic valve mean gradient measures 6.5 mmHg. Aortic valve Vmax measures  1.96 m/s. Peak gradient 15 mmHg.  4. The inferior vena cava is normal in size with greater than 50%  respiratory variability, suggesting right atrial  pressure of 3 mmHg.  5. The mitral valve is grossly normal. Trivial mitral valve  regurgitation.   Comparison(s): Changes from prior study are noted. 08/04/2019: LVEF 60-65%,  26 mm ES3 valve, mean gradient 10 mmHg, peak gradient 19 mmHg.   FINDINGS  Left Ventricle: Left ventricular ejection fraction, by estimation, is 65  to 70%. The left ventricle has normal function. The left ventricle has no  regional wall motion abnormalities. The left ventricular internal cavity  size was normal in size. There is  mild left ventricular hypertrophy. Left ventricular diastolic function  could not be evaluated due to atrial fibrillation. Left ventricular  diastolic function could not be evaluated.   Left atrium was reported normal in size but has a LA diameter of 5.80 indicating severely dilated left atrium   Assessment and Plan: 1. Persistent afib since May of this year Well rate controlled  Continue bisoprolol 5 mg bid   Cardioversion   but with just a few minutes of SR I discussed restoring SR with  antiarrythmic's She is unsure if she wants to commit to more regimented drugs as her parkinson drugs demand so much of her time  I feel with her dilated left atrium, obesity,  it may be hard  to restore and maintain SR I feel her shortness of breath is multi factorial, just not afib alone,due to age, weight deconditioning from parkinson's, issues ambulating  I did find out from  PharmD that her parkinson's drugs will mix well with tikosyn  I discussed with her tikosyn admission again, she can not commit as she has to have a repeat mammogram and wants to sort thru  that first  After the results of that are known then she will let me know if she wants to schedule elective  admission for Tikosyn  If she does agree to Tikosyn, will need to stop hctz, for 3 days before admission,  reminded no benadryl use if coming into hospital for tikosyn for 3 days prior  qtc looks acceptable today at 430 ms   2.  CHA2DS2VAScof 3  Continue  eliquis 5 mg bid  States no missed doses  Pt will let me know going forward if she wants to pursue tikosyn   Lupita Leash C. Matthew Folks [facility] [address] [place], [place] [address] [phone]

## 2020-01-27 ENCOUNTER — Other Ambulatory Visit: Payer: Self-pay

## 2020-01-27 ENCOUNTER — Ambulatory Visit: Payer: Medicare PPO

## 2020-01-27 ENCOUNTER — Ambulatory Visit
Admission: RE | Admit: 2020-01-27 | Discharge: 2020-01-27 | Disposition: A | Discharge status: Home / Self Care | Payer: Medicare PPO | Source: Ambulatory Visit | Attending: Family Medicine | Admitting: Family Medicine

## 2020-01-27 DIAGNOSIS — R928 Other abnormal and inconclusive findings on diagnostic imaging of breast: Secondary | ICD-10-CM

## 2020-01-27 DIAGNOSIS — N6001 Solitary cyst of right breast: Secondary | ICD-10-CM | POA: Diagnosis not present

## 2020-01-28 ENCOUNTER — Other Ambulatory Visit: Payer: Self-pay | Admitting: Neurology

## 2020-01-28 NOTE — Telephone Encounter (Signed)
Rx(s) sent to pharmacy electronically.  

## 2020-02-03 DIAGNOSIS — K5900 Constipation, unspecified: Secondary | ICD-10-CM | POA: Diagnosis not present

## 2020-02-22 ENCOUNTER — Other Ambulatory Visit: Payer: Self-pay | Admitting: Neurology

## 2020-04-13 ENCOUNTER — Telehealth: Payer: Self-pay | Admitting: Internal Medicine

## 2020-04-13 NOTE — Telephone Encounter (Signed)
   [facility] Pre-operative Risk Assessment    HEARTCARE STAFF: - Please ensure there is not already an duplicate clearance open for this procedure. - Under Visit Info/Reason for Call, type in Other and utilize the format Clearance MM/DD/YY or Clearance TBD. Do not use dashes or single digits. - If request is for dental extraction, please clarify the # of teeth to be extracted.  Request for surgical clearance:  1. What type of surgery is being performed? Dental cleanings, dental treatment   2. When is this surgery scheduled? TBD   3. What type of clearance is required (medical clearance vs. Pharmacy clearance to hold med vs. Both)? Both   4. Are there any medications that need to be held prior to surgery and how long? Not specified on holding meds, pre-meds required?   5. Practice name and name of physician performing surgery? [facility]   6. What is the office phone number? [phone]   7.   What is the office fax number? [phone]  8.   Anesthesia type (None, local, MAC, general) ? Not specified    Fidel Levy 04/13/2020, 10:32 AM  _________________________________________________________________   (provider comments below)

## 2020-04-14 NOTE — Telephone Encounter (Signed)
   Primary Cardiologist: [name], MD  Chart reviewed as part of pre-operative protocol coverage.  Simple dental extractions are considered low risk procedures per guidelines and generally do not require any specific cardiac clearance. It is also generally accepted that for simple extractions and dental cleanings, there is no need to interrupt blood thinner therapy.  If multiple extractions or complex dental procedure, the requesting office will need to send new clearance.   Of note, Cindy Richmond is on Eliquis 5mg  BID due to atrial fibrillation.   Due to history of TAVR, SBE prophylaxis is required for the patient from a cardiac standpoint. She has a standing order for Keflex prior to dental procedures that was sent to her pharmacy 08/11/19 with refills.   I will route this recommendation to the requesting party via Epic fax function and remove from pre-op pool.  Please call with questions.  08/13/19, NP 04/14/2020, 11:00 AM

## 2020-04-19 DIAGNOSIS — H34212 Partial retinal artery occlusion, left eye: Secondary | ICD-10-CM | POA: Diagnosis not present

## 2020-04-19 DIAGNOSIS — H04123 Dry eye syndrome of bilateral lacrimal glands: Secondary | ICD-10-CM | POA: Diagnosis not present

## 2020-04-19 DIAGNOSIS — H353131 Nonexudative age-related macular degeneration, bilateral, early dry stage: Secondary | ICD-10-CM | POA: Diagnosis not present

## 2020-04-19 DIAGNOSIS — H34232 Retinal artery branch occlusion, left eye: Secondary | ICD-10-CM | POA: Diagnosis not present

## 2020-04-24 DIAGNOSIS — Z0000 Encounter for general adult medical examination without abnormal findings: Secondary | ICD-10-CM | POA: Diagnosis not present

## 2020-04-24 DIAGNOSIS — R7301 Impaired fasting glucose: Secondary | ICD-10-CM | POA: Diagnosis not present

## 2020-04-24 DIAGNOSIS — E039 Hypothyroidism, unspecified: Secondary | ICD-10-CM | POA: Diagnosis not present

## 2020-04-24 DIAGNOSIS — I10 Essential (primary) hypertension: Secondary | ICD-10-CM | POA: Diagnosis not present

## 2020-04-24 DIAGNOSIS — N200 Calculus of kidney: Secondary | ICD-10-CM | POA: Diagnosis not present

## 2020-04-24 DIAGNOSIS — Z1211 Encounter for screening for malignant neoplasm of colon: Secondary | ICD-10-CM | POA: Diagnosis not present

## 2020-04-24 DIAGNOSIS — Z79899 Other long term (current) drug therapy: Secondary | ICD-10-CM | POA: Diagnosis not present

## 2020-04-24 DIAGNOSIS — E559 Vitamin D deficiency, unspecified: Secondary | ICD-10-CM | POA: Diagnosis not present

## 2020-04-24 DIAGNOSIS — H6122 Impacted cerumen, left ear: Secondary | ICD-10-CM | POA: Diagnosis not present

## 2020-04-25 DIAGNOSIS — H2513 Age-related nuclear cataract, bilateral: Secondary | ICD-10-CM | POA: Diagnosis not present

## 2020-04-25 DIAGNOSIS — H35033 Hypertensive retinopathy, bilateral: Secondary | ICD-10-CM | POA: Diagnosis not present

## 2020-04-25 DIAGNOSIS — H34232 Retinal artery branch occlusion, left eye: Secondary | ICD-10-CM | POA: Diagnosis not present

## 2020-04-25 DIAGNOSIS — H43821 Vitreomacular adhesion, right eye: Secondary | ICD-10-CM | POA: Diagnosis not present

## 2020-04-26 ENCOUNTER — Other Ambulatory Visit: Payer: Self-pay | Admitting: Neurology

## 2020-04-27 ENCOUNTER — Other Ambulatory Visit: Payer: Self-pay

## 2020-04-27 MED ORDER — CARBIDOPA-LEVODOPA 25-100 MG PO TABS
ORAL_TABLET | ORAL | 0 refills | Status: DC
Start: 2020-04-27 — End: 2020-05-19

## 2020-04-27 NOTE — Telephone Encounter (Signed)
Rx(s) sent to pharmacy electronically.  

## 2020-05-18 DIAGNOSIS — H353131 Nonexudative age-related macular degeneration, bilateral, early dry stage: Secondary | ICD-10-CM | POA: Diagnosis not present

## 2020-05-18 DIAGNOSIS — H25813 Combined forms of age-related cataract, bilateral: Secondary | ICD-10-CM | POA: Diagnosis not present

## 2020-05-18 DIAGNOSIS — H04123 Dry eye syndrome of bilateral lacrimal glands: Secondary | ICD-10-CM | POA: Diagnosis not present

## 2020-05-18 DIAGNOSIS — H34232 Retinal artery branch occlusion, left eye: Secondary | ICD-10-CM | POA: Diagnosis not present

## 2020-05-18 NOTE — Progress Notes (Signed)
Assessment/Plan:   1.  Parkinsons Disease   -we talked about changing her medication to the CR version as she is sleepy with first day dose of levodopa.  She didn't want to do that right now.    -I wanted her to take carbidopa/levodopa 25/100, 2 po qid as she is under dosed currently, and she had backed her dosage down.  She is complaining that the legs feel heavy.  She is blaming that on the Eliquis, but I suspect that is from being underdosed.  However, she does not want to do that.  She states that she does not like taking pills.  She states that she will continue to take carbidopa/levodopa 25/100, 2 tablets at 8 AM/2 tablets at noon/1 tablet at 4 PM/1 tablet at 8 PM.  I did give her an extra tablet to take throughout the day, as I think she will need it as she feels underdosed.  -discussed PT for Parkinsons Disease.  She declines that today  -Discussed importance of safe, cardiovascular exercise.  We did discuss aquatic therapy.  -Declines agonist therapy.  2.  Urinary frequency  -Has seen urology in the past  3.  Atrial fibrillation  -Failed cardioversion attempt in the past.  -On Eliquis Subjective:   Cindy Richmond was seen today in follow up for Parkinsons disease.  My previous records were reviewed prior to todays visit as well as outside records available to me. Pt denies falls.  Pt denies lightheadedness, near syncope.  No hallucinations.    Current prescribed movement disorder medications: carbidopa/levodopa 25/100, 2 tablets at 9-10am/2pm/5pm/8pm (increased last visit) - she didn't go up on it and she is doing 2 at 8am/2 at noon/1at 4pm/1at 8pm because she didn't like taking "so many pills."  - she thinks that the AM pill causes drowsiness.      ALLERGIES:   Allergies  Allergen Reactions  . Nickel     Nickel earrings, ear holes get infected  . Penicillins Other (See Comments)    unknown Did it involve swelling of the face/tongue/throat, SOB, or low BP? Unknown Did it  involve sudden or severe rash/hives, skin peeling, or any reaction on the inside of your mouth or nose? Unknown Did you need to seek medical attention at a hospital or doctor's office? Unknown When did it last happen?newborn allergy If all above answers are "NO", may proceed with cephalosporin use.     CURRENT MEDICATIONS:  Outpatient Encounter Medications as of 05/19/2020  Medication Sig  . apixaban (ELIQUIS) 5 MG TABS tablet Take 1 tablet (5 mg total) by mouth 2 (two) times daily.  Marland Kitchen aspirin EC 81 MG tablet Take 81 mg by mouth daily. Swallow whole.  . bisoprolol-hydrochlorothiazide (ZIAC) 5-6.25 MG tablet Take 1 tablet by mouth daily.  . cephALEXin (KEFLEX) 500 MG capsule Take 2,000 mg (4 capsules) one hour to all dental visits.  . cholecalciferol (VITAMIN D) 25 MCG (1000 UNIT) tablet Take 1,000 Units by mouth daily.  . Collagen-Boron-Hyaluronic Acid ([employer] JOINT HEALTH TRIPLE ACTION PO) Take 1 tablet by mouth daily. Triple Action Joint Health  . Cyanocobalamin (VITAMIN B-12) 5000 MCG TBDP Take 5,000 mcg by mouth daily.  Marland Kitchen docusate sodium (COLACE) 100 MG capsule Take 200 mg by mouth daily as needed (constipation.).   Marland Kitchen esomeprazole (NEXIUM) 20 MG capsule Take 20 mg by mouth daily.   Marland Kitchen levothyroxine (SYNTHROID, LEVOTHROID) 75 MCG tablet Take 75 mcg by mouth daily before breakfast.   . Menthol-Methyl Salicylate (SALONPAS  PAIN RELIEF PATCH) PTCH Place 1 patch onto the skin daily as needed (pain.).  Marland Kitchen Multiple Vitamins-Minerals (PRESERVISION AREDS PO) Take 1 tablet by mouth daily.   . Omega-3 Fatty Acids (FISH OIL) 1200 MG CAPS Take 1,200 mg by mouth daily.  Bertram Gala Glycol-Propyl Glycol (SYSTANE OP) Place 1 drop into both eyes in the morning and at bedtime.  . [DISCONTINUED] carbidopa-levodopa (SINEMET IR) 25-100 MG tablet TAKE 2 TABLETS AT 8AM, 2 TABLETS AT NOON, 1 TABLET AT 4PM, AND 1 TABLET AT 8PM  . carbidopa-levodopa (SINEMET IR) 25-100 MG tablet 2 AT 8AM, 2 AT NOON, 1 AT 4PM, AND  1 AT 8PM; may take extra prn   No facility-administered encounter medications on file as of 05/19/2020.    Objective:   PHYSICAL EXAMINATION:    VITALS:   Vitals:   05/19/20 1455  BP: 114/80  Pulse: 87  SpO2: 97%  Weight: 274 lb (124.3 kg)  Height: 5\' 2"  (1.575 m)    GEN:  The patient appears stated age and is in NAD. HEENT:  Normocephalic, atraumatic.  The mucous membranes are moist. The superficial temporal arteries are without ropiness or tenderness. CV:  RRR Lungs:  CTAB.  There is some DOE Neck/HEME:  There are no carotid bruits bilaterally.  Neurological examination:  Orientation: The patient is alert and oriented x3. Cranial nerves: There is good facial symmetry with mild facial hypomimia. The speech is fluent and clear. Soft palate rises symmetrically and there is no tongue deviation. Hearing is intact to conversational tone. Sensation: Sensation is intact to light touch throughout Motor: Strength is at least antigravity x4.  Movement examination: Tone: There is mild to moderate increased tone in the right upper extremity Abnormal movements:  rare dyskinesia in the R leg Coordination:  There is  decremation with RAM's, with any form of RAMS, including alternating supination and pronation of the forearm, hand opening and closing, finger taps, heel taps and toe taps on the right Gait and Station: The patient pushes off to arise.  She walks fairly well with a cane.    I have reviewed and interpreted the following labs independently    Chemistry      Component Value Date/Time   NA 143 11/26/2019 1059   K 4.3 11/26/2019 1059   CL 101 11/26/2019 1059   CO2 24 11/26/2019 1059   BUN 14 11/26/2019 1059   CREATININE 0.84 11/26/2019 1059      Component Value Date/Time   CALCIUM 9.2 11/26/2019 1059   ALKPHOS 71 07/30/2019 0934   AST 14 (L) 07/30/2019 0934   ALT 7 07/30/2019 0934   BILITOT 0.8 07/30/2019 0934       Lab Results  Component Value Date   WBC 9.0  11/26/2019   HGB 15.1 11/26/2019   HCT 49.3 (H) 11/26/2019   MCV 83 11/26/2019   PLT 129 (L) 11/26/2019    Lab Results  Component Value Date   TSH 4.21 10/06/2006     Total time spent on today's visit was 30 minutes, including both face-to-face time and nonface-to-face time.  Time included that spent on review of records (prior notes available to me/labs/imaging if pertinent), discussing treatment and goals, answering patient's questions and coordinating care.  Cc:  12/06/2006, MD

## 2020-05-19 ENCOUNTER — Other Ambulatory Visit: Payer: Self-pay

## 2020-05-19 ENCOUNTER — Encounter: Payer: Self-pay | Admitting: Neurology

## 2020-05-19 ENCOUNTER — Ambulatory Visit: Payer: Medicare PPO | Admitting: Neurology

## 2020-05-19 VITALS — BP 114/80 | HR 87 | Ht 62.0 in | Wt 274.0 lb

## 2020-05-19 DIAGNOSIS — G20A1 Parkinson's disease without dyskinesia, without mention of fluctuations: Secondary | ICD-10-CM

## 2020-05-19 DIAGNOSIS — G249 Dystonia, unspecified: Secondary | ICD-10-CM

## 2020-05-19 DIAGNOSIS — G20 Parkinson's disease: Secondary | ICD-10-CM

## 2020-05-19 DIAGNOSIS — G20B1 Parkinson's disease with dyskinesia, without mention of fluctuations: Secondary | ICD-10-CM

## 2020-05-19 MED ORDER — CARBIDOPA-LEVODOPA 25-100 MG PO TABS
ORAL_TABLET | ORAL | 1 refills | Status: DC
Start: 2020-05-19 — End: 2020-11-20

## 2020-05-19 NOTE — Patient Instructions (Signed)
Online Resources for Power over Parkinson's Group March 2022  . Local [place] Online Groups  o Power over Parkinson's Group :   - Power Over Parkinson's Patient Education Group will be Wednesday, March 9th at 2pm via Zoom.   - Upcoming Power over Parkinson's Meetings:  2nd Wednesdays of the month at 2 pm:       April 13th, May 11th - Contact Amy Marriott at [email or web address] if interested in participating in this online group o Parkinson's Care [facility] Group:    3rd Mondays, Contact Sarah Chambers o Atypical Parkinsonian Patient Group:   4th Wednesdays, Contact Sarah Chambers o If you are interested in participating in these online groups with Sarah, please contact her directly for how to join those meetings.  Her contact information is [email or web address].  She will send you a link to join the Zoom meeting.  (Please note that Sarah Chambers , MSW, LCSW, has resigned her position at [employer], but will continue to lead the online groups temporarily)  . Parkinson Foundation:  www.parkinson.org o PD Health at Home continues:  Mindfulness Mondays, Expert Briefing Tuesdays, Wellness Wednesdays, Take Time Thursdays, Fitness Fridays -Listings for March 2022 are on the website o Upcoming Webinar:  Conversations about Complementary Therapies and PD.  Wednesday, March 2nd @ 1 pm o Upcoming Webinar:  Can We Put the Brakes on PD Progression?  Wednesday, April 6th @ 1 pm o Register for expert briefings (webinars) at [email or web address] o  Please check out their website to sign up for emails and see their full online offerings  . [employer] Foundation:  www.michaeljfox.org  o Upcoming Webinar:   Trouble Sleeping?  What to Know about Acting out Dreams and other Sleep Issues.  Thursday, March 17th @ 12 noon o Check out additional information on their website to see their full online offerings  . [employer] Phinney Foundation:  www.davisphinneyfoundation.org o Upcoming  Webinar:  Women and Parkinson's.  Tuesday, March 8th at 2 pm o Care Partner Monthly Meetup.  With Connie Carpenter Phinney.  First Tuesday of each month, 2 pm o Check out additional information to Live Well Today on their website  . Parkinson and Movement Disorders (PMD) Alliance:  www.pmdalliance.org o [employer]:  Online Education Events o Sign up for emails, which are sent weekly to give you updates on programming and online offerings   . Parkinson's Association of [place]:  www.parkinsonassociation.org o Information on online support groups, education events, and online exercises including Yoga, Parkinson's exercises and more-LOTS of information on links to PD resources and online events o Virtual Support Group through Parkinson's Association of [place]; next one is scheduled for Wednesday, May 31, 2020 at 2 pm. (These are typically scheduled for the 1st Wednesday of the month at 2 pm).  Visit website for details.  . Additional links for movement activities: o PWR! Moves Classes at [facility] Exercise Room HAVE RESUMED!  Wednesdays 10 and 11 am.  Contact Amy Marriott, PT [email or web address] or [phone] if interested o Here is a link to the PWR!Moves classes on Zoom from [place] Parkinson's Foundation - Daily Mon-Sat at 10:00. Via Zoom, FREE and open to all.  There is also a link below via Facebook if you use that platform. - https://www.parkinsonsmi.org/mpf-programs/exercise-and-movement-activities - https://www.facebook.com/ParkinsonsMI.org/posts/pwr-moves-exercise-class-parkinson-wellness-recovery-online-with-angee-ludwa-pt-/10156827878021813/ o Parkinson's Wellness Recovery (PWR! Moves)  www.pwr4life.org - Info on the PWR! Virtual Experience:  You will have access to our expertise through self-assessment, guided plans that start with the PD-specific fundamentals, educational content, tips, Q&A with an expert,   and a growing library of PD-specific  pre-recorded and live exercise classes of varying types and intensity - both physical and cognitive! If that is not enough, we offer 1:1 wellness consultations (in-person or virtual) to personalize your PWR! Virtual Experience.  - Check out the PWR! Move of the month on the Parkinson Wellness Recovery website:  https://www.pwr4life.org/pwr-move-of-the-month-4/ o Parkinson Foundation Fitness Fridays:  - As part of the PD Health @ Home program, this free video series focuses each week on one aspect of fitness designed to support people living with Parkinson's.  These weekly videos highlight the Parkinson Foundation recent fitness guidelines for people with Parkinson's disease. -  www.parkinson.org/understanding-parkinsons/coronavirus/PD-health-at-home/Fitness-Fridays o Dance for PD website is offering free, live-stream classes throughout the week, as well as links to digital library of classes:  https://danceforparkinsons.org/ o Dance for Parkinson's Class:  [employer] [place].  Free offering for people with Parkinson's and care partners; virtual class.  o For more information, contact [phone] or email Magalli Morana at [email or web address] o Virtual dance and Pilates for Parkinson's classes: Click on the Community Tab> Parkinson's Movement Initiative Tab.  To register for classes and for more information, visit www.americandancefestival.org and click the "community" tab.     o [employer] Parkinson's [facility]ing Classes  - [employer]: 1pm on Fridays-Live classes at [employer] (Contact Beth McKinney at [email or web address] or [phone]) - [employer]: Virtual Classes Mondays and Thursdays (contact Priscilla at [email or web address] or [phone])   o [place] Steady Boxing - Three levels of classes are offered Tuesdays and Thursdays:  10:30 am,  12 noon & 1:45 pm at [employer] [facility].  - Active Stretching with Maria, New Class starting in  March, on Fridays - To observe a class or for  more information, call [phone] or email [email or web address] . [facility] [employer]: o Online Caregiver Education Opportunities:  www.well-springsolutions.org/caregiver-education/caregiver-support-group.  You may also contact Jodi Kolada at [email or web address] or [phone].   o Virtual Caregiver Retreat, Monday, February 21st, 3-5 pm o Powerful Tools for Caregivers, 6-wk series for caregivers, in-person, Thursdays March 10, 17, 24, 31 and April 7, 14, from 10:30-12:30 at [facility] Conference Room, [address]  Contact Jodi Kolada (see above) to register o [facility] Navigator:  Just1Navigator program, a free service to help individuals and families through the journey of determining care for older adults.  The "Navigator" is a social worker, Nicole Reynolds, who will speak with a prospective client and/or loved ones to provide an assessment of the situation and a set of recommendations for a personalized care plan -- all free of charge, and whether [facility] offers the needed service or not. If the need is not a service we provide, we are well-connected with reputable programs in town that we can refer you to.  www.well-springsolutions.org or to speak with the Navigator, call [phone].  

## 2020-06-05 IMAGING — CT 3-DIMENSIONAL CT IMAGE RENDERING ON INDEPENDENT WORKSTATION
3 of 8 series · 12 of 36 positions shown, 14 images · non-contrast
Comparison: Radiographs dated 07/01/2018

CLINICAL DATA: Left proximal humerus fracture secondary to a fall 1
week ago.

EXAM:
CT OF THE UPPER LEFT EXTREMITY WITHOUT CONTRAST;
3-DIMENSIONAL CT IMAGE RENDERING ON INDEPENDENT WORKSTATION
TECHNIQUE: 3-dimensional CT images were rendered by post-processing of the
original CT data on an independent workstation. The 3-dimensional CT
images were interpreted and findings were reported in the
accompanying complete CT report for this study
Multidetector CT imaging of the upper left extremity was performed
according to the standard protocol.

[Series 6: shoulder 2.00 br40 s3 cor · coronal · 0.42mm/px · 1 of 121 slices shown]
[im 61/121  bone]
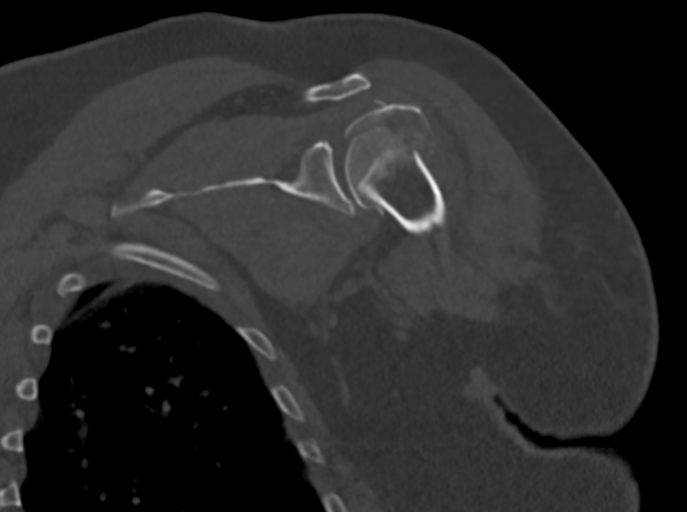

[Series 10: shoulder 2.00 br40 s3 sag · sagittal · 0.42mm/px · 6 of 140 slices shown]
[im 3/140  soft-tissue]
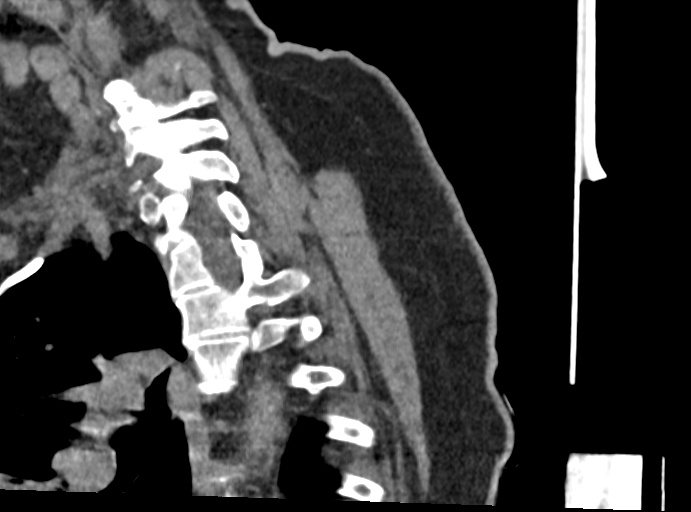
[im 24/140  bone]
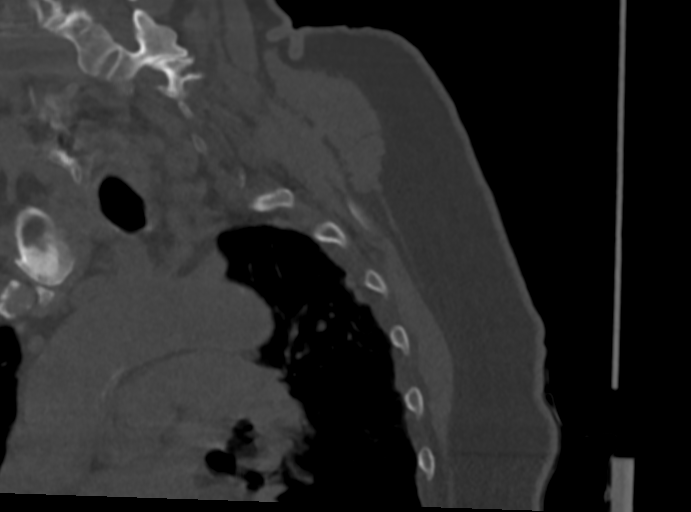
[im 47/140  bone]
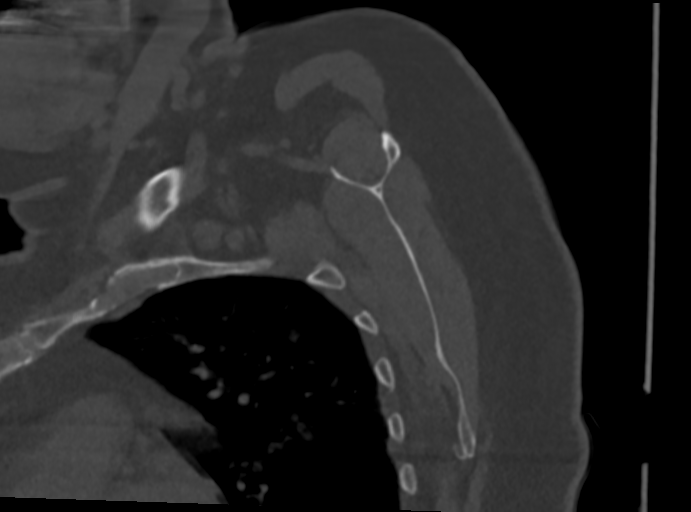
[im 70/140  bone]
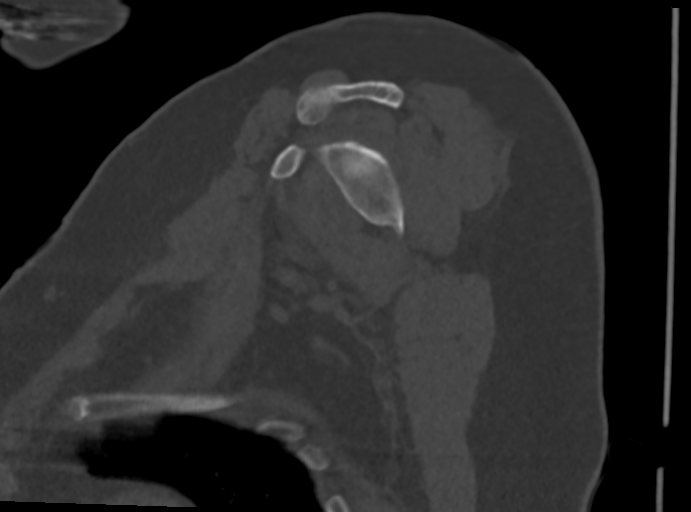
[im 93/140  bone]
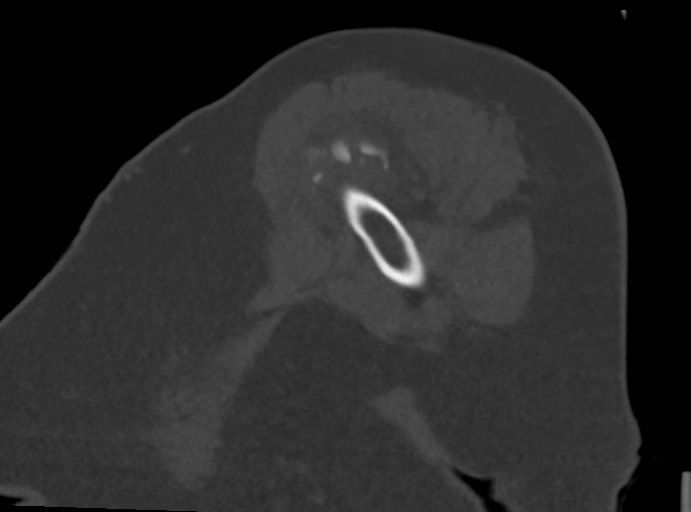
[im 116/140  bone]
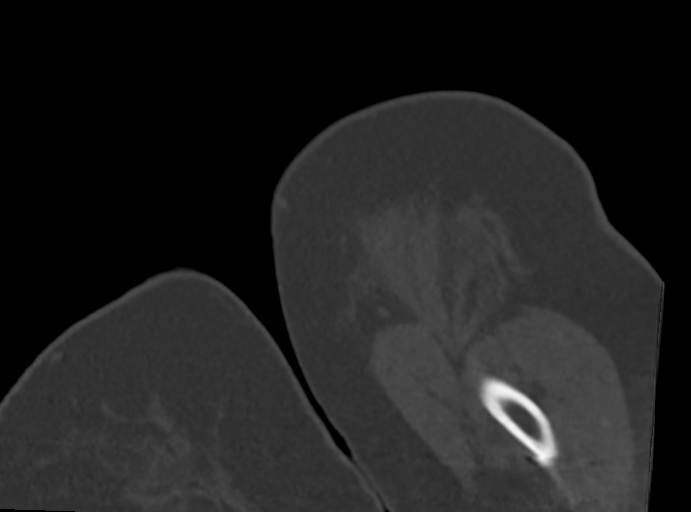

[Series 15: shoulder 0.60 br60 s3 thins bone · axial · 0.58mm/px · z∈[-690,-547]mm · 5 of 358 slices shown, 7 images]
[im 60/358  soft-tissue]
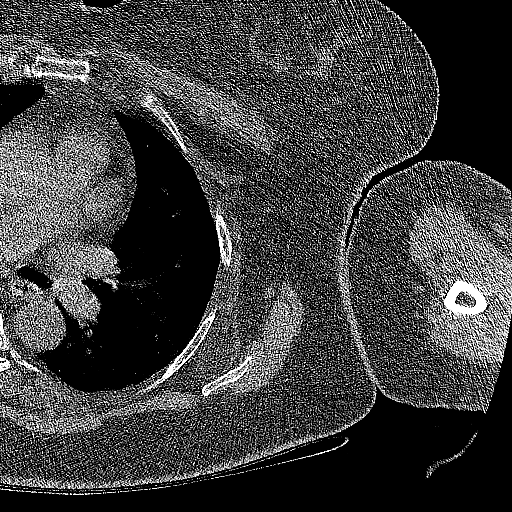
[im 60/358  bone]
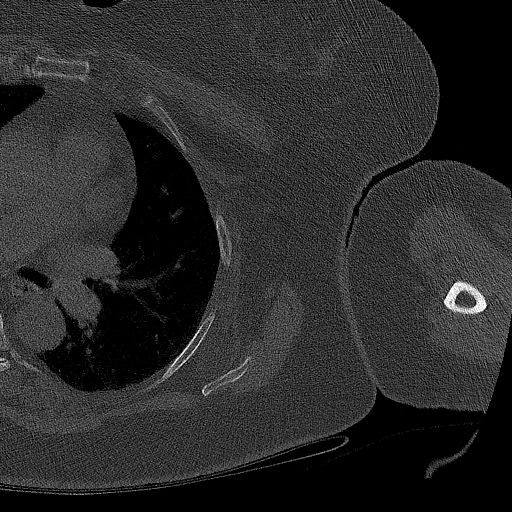
[im 120/358  bone]
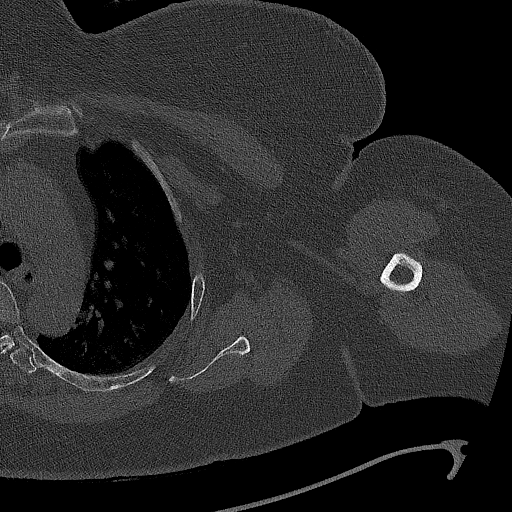
[im 179/358  bone]
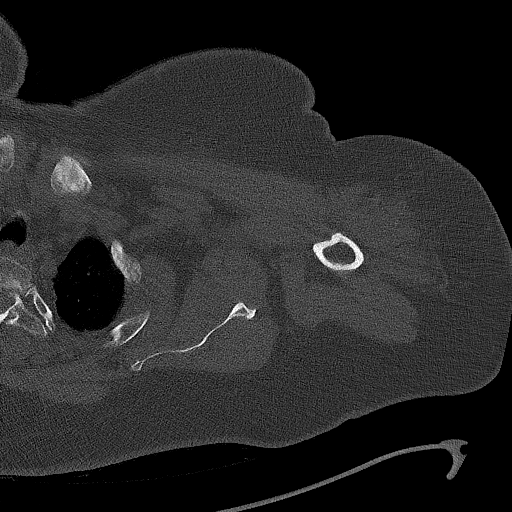
[im 239/358  bone]
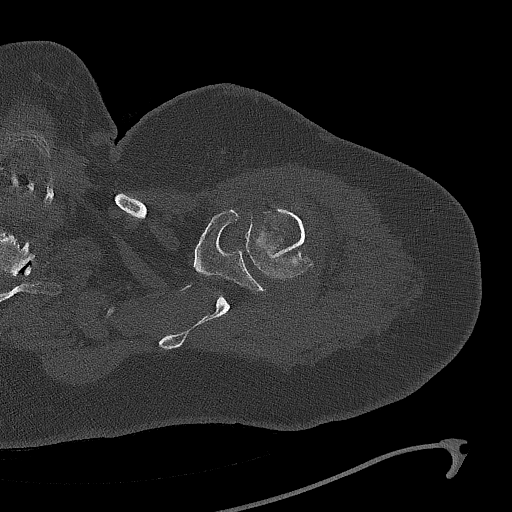
[im 298/358  soft-tissue]
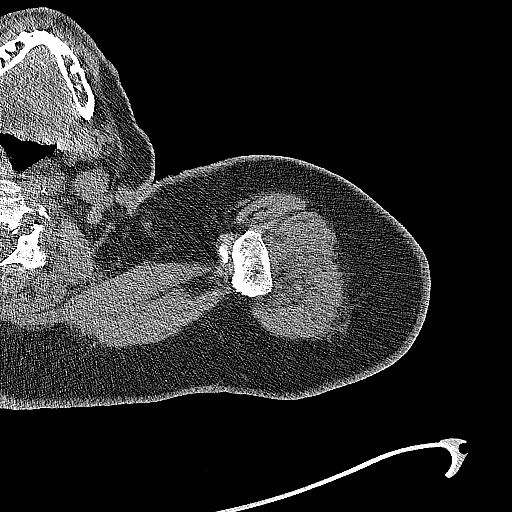
[im 298/358  bone]
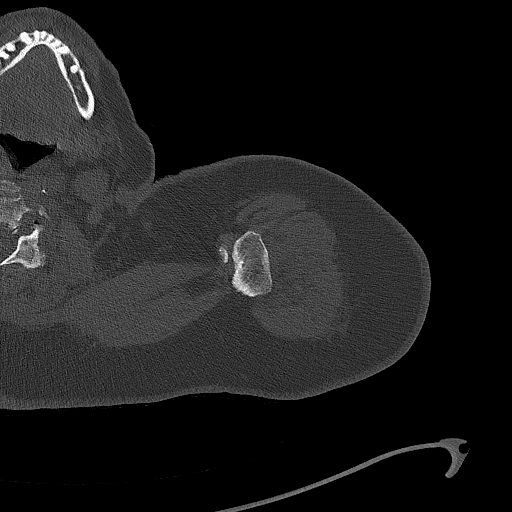

[12 of 36 positions shown; findings below may reference images not displayed]

FINDINGS: Bones/Joint/Cartilage

There is a comminuted fracture of the left humeral head. The
fracture is impacted. The fracture involves several areas of the
articular surface of the humeral head. Is also rotation of some of
the major fragments. There is no dislocation. The scapula and left
clavicle are intact.

There are fairly severe arthritic changes at the left
sternoclavicular joint.

There is an effusion or hemarthrosis at the glenohumeral joint.

Muscles and Tendons

No visible abnormality. No atrophy of the muscles of the rotator
cuff.

Soft tissues

No significant abnormality.
IMPRESSION: Comminuted impacted and slightly angulated fracture left humeral
head as described.

## 2020-06-20 ENCOUNTER — Other Ambulatory Visit: Payer: Self-pay | Admitting: Physician Assistant

## 2020-06-20 DIAGNOSIS — Z953 Presence of xenogenic heart valve: Secondary | ICD-10-CM

## 2020-06-21 NOTE — Telephone Encounter (Signed)
Okay to hold Eliquis for 2 days prior to her dental cleaning.  She also needs SBE prophylaxis because of her tach TAVR.

## 2020-06-21 NOTE — Telephone Encounter (Signed)
Spoke with Shanda Bumps from [facility] as well as Armed forces operational officer regarding clearance for pt to have periodontal scaling and root planting. Pt is on eliquis. Per dental hygienist pt's that are not on a blood thinner can have significant bleeding. Per Dr. Allyson Sabal pt should hold blood thinner 48 hours prior to this procedure.  Pt is also s/p TAVR therefore pt will need SBE prophylaxis.  Dental to send over clearance to be updated.

## 2020-06-21 NOTE — Telephone Encounter (Signed)
Cindy Richmond from [facility] calling for dental clearance. She states they received an okay for simple extractions and cleanings, but the need to know if she can have an SRP, periodontal scaling and root planting. Patient is in the office now. Fax: [phone] Phone: [phone]

## 2020-08-03 ENCOUNTER — Ambulatory Visit: Payer: Medicare PPO | Admitting: Physician Assistant

## 2020-08-03 ENCOUNTER — Ambulatory Visit (HOSPITAL_COMMUNITY): Payer: Medicare PPO | Attending: Cardiology

## 2020-08-03 ENCOUNTER — Other Ambulatory Visit: Payer: Self-pay

## 2020-08-03 ENCOUNTER — Encounter: Payer: Self-pay | Admitting: Physician Assistant

## 2020-08-03 VITALS — BP 140/80 | HR 102 | Ht 62.0 in | Wt 280.0 lb

## 2020-08-03 DIAGNOSIS — I4819 Other persistent atrial fibrillation: Secondary | ICD-10-CM

## 2020-08-03 DIAGNOSIS — Z953 Presence of xenogenic heart valve: Secondary | ICD-10-CM | POA: Diagnosis not present

## 2020-08-03 DIAGNOSIS — I10 Essential (primary) hypertension: Secondary | ICD-10-CM

## 2020-08-03 DIAGNOSIS — I5032 Chronic diastolic (congestive) heart failure: Secondary | ICD-10-CM

## 2020-08-03 LAB — ECHOCARDIOGRAM COMPLETE
AR max vel: 1.05 cm2
AV Area VTI: 1.04 cm2
AV Area mean vel: 1.05 cm2
AV Mean grad: 10 mmHg
AV Peak grad: 17.1 mmHg
Ao pk vel: 2.07 m/s
Area-P 1/2: 4.53 cm2
S' Lateral: 2.9 cm

## 2020-08-03 NOTE — Patient Instructions (Signed)
Medication Instructions:  1) STOP ASPIRIN *If you need a refill on your cardiac medications before your next appointment, please call your pharmacy*   Follow-Up: At [facility], you and your health needs are our priority.  As part of our continuing mission to provide you with exceptional heart care, we have created designated Provider Care Teams.  These Care Teams include your primary Cardiologist (physician) and Advanced Practice Providers (APPs -  Physician Assistants and Nurse Practitioners) who all work together to provide you with the care you need, when you need it. Your next appointment:   6 month(s) The format for your next appointment:   In Person Provider:   You may see [name], MD or one of the following Advanced Practice Providers on your designated Care Team:   [place], PA-C Edd Fabian, FNP

## 2020-08-03 NOTE — Progress Notes (Signed)
[facility]   MULTIDISCIPLINARY HEART VALVE CLINIC                                       Cardiology Office Note    Date:  08/04/2020   ID:  Cindy Richmond, Cindy Richmond 02-Mar-1950, MRN [id number]  PCP:  Mila Palmer, MD  Cardiologist: [name], MD / Dr. Clifton James & Dr. Cornelius Moras (TAVR)  CC: 1 year s/p TAVR  History of Present Illness:  Cindy Richmond is a 70 y.o. female with a history of GERD, HTN, HLD, hypothyroidism, Parkinson's disease, atrial fibrillation on Eliquis and severe aortic stenosis s/p TAVR (08/03/19) who presents to clinic for follow up.   Echocardiogram obtained in March 2021 showed normal EF, severe aortic stenosis with gradient 42 mmHg.  She had symptomatic aortic stenosis with significant dyspnea on exertion.  Cardiac catheterization performed on 07/05/2019 showed no angiographic evidence of CAD, severe aortic stenosis with mean gradient 28.6 mmHg, AVA 0.9 cm.  At the time of her cath, she was found to be in new atrial fibrillation and has been placed on Eliquis.  Pre-TAVR CT showed mild right paratracheal lymphadenopathy that require no follow-up unless patient has a history of malignancy, he also revealed a subcentimeter hypodense left liver lesion that was too small to characterize and also requires no follow-up unless she has risk for liver malignancy. She eventually underwent TAVR procedure with placement of Edwards Sapien 3 Ultra THV size 77mm on 6/8 by Dr. Cornelius Moras and Dr. Clifton James.  Postop limited echocardiogram obtained on 08/04/2019 showed EF 60 to 65%, stable TAVR valve without paravalvular leak.  Repeat echocardiogram continue to show normal functioning bioprosthetic valve on 09/07/2019. I saw her in valve clinic on 09/07/2019, at the time outpatient cardioversion has been discussed with the patient however prefer to hold off on the procedure at the time given how well she felt.  She eventually underwent cardioversion on 11/30/2019 however was only able to hold sinus rhythm  for a few minutes.  She was seen in the A. fib clinic on 12/01/2019, various option has been discussed with the patient who eventually decided with Tikosyn loading. However, she later decided against this and tried to work on diet and exercise.  Today she presents to clinic for follow up. Doing okay. She feels tired all the time like she did before her valve was replaced. She doesn't think that it's related to her afib but more due to her Parkinson's, which has progressed. Has occasional LE edema and orthopnea. Has chronic dyspnea. No chest pain.   Past Medical History:  Diagnosis Date   Atrial fibrillation, persistent (HCC)    Dyspnea    with head low, sleep apnea   Dysrhythmia    afib   GERD (gastroesophageal reflux disease)    H/O hiatal hernia    Hematuria    History of kidney stones    HOH (hard of hearing)    sightly, bilaterally   Hyperlipidemia    Hypertension    Hypothyroidism    Parkinson disease (HCC)    S/P TAVR (transcatheter aortic valve replacement) 08/03/2019   s/p TAVR with 26 mm Edwards S3U via the TF approach by Drs Aundra Dubin & Cornelius Moras   Severe aortic stenosis    Sleep apnea     Past Surgical History:  Procedure Laterality Date   ABDOMINAL HYSTERECTOMY     lso  CARDIOVERSION N/A 11/30/2019   Procedure: CARDIOVERSION;  Surgeon: Lewayne Buntingrenshaw, Brian S, MD;  [facility];  Service: Cardiovascular;  Laterality: N/A;   CYSTOSCOPY W/ RETROGRADES  02/28/2012   Procedure: CYSTOSCOPY WITH RETROGRADE PYELOGRAM;  Surgeon: Sebastian Acheheodore Manny, MD;  [facility] [place];  Service: Urology;  Laterality: Right;   CYSTOSCOPY WITH STENT PLACEMENT  02/28/2012   Procedure: CYSTOSCOPY WITH STENT PLACEMENT;  Surgeon: Sebastian Acheheodore Manny, MD;  [facility] [place];  Service: Urology;  Laterality: Left;   CYSTOSCOPY/RETROGRADE/URETEROSCOPY/STONE EXTRACTION WITH BASKET  02/28/2012   Procedure: CYSTOSCOPY/RETROGRADE/URETEROSCOPY/STONE EXTRACTION WITH BASKET;  Surgeon:  Sebastian Acheheodore Manny, MD;  [facility] [place];  Service: Urology;  Laterality: Left;   HOLMIUM LASER APPLICATION  02/28/2012   Procedure: HOLMIUM LASER APPLICATION;  Surgeon: Sebastian Acheheodore Manny, MD;  [facility] [place];  Service: Urology;  Laterality: Left;   INTRAOPERATIVE TRANSTHORACIC ECHOCARDIOGRAM  08/03/2019   Procedure: Intraoperative Transthoracic Echocardiogram;  Surgeon: Kathleene HazelMcAlhany, Christopher D, MD;  [facility];  Service: Open Heart Surgery;;   KNEE ARTHROSCOPY     right   REVERSE SHOULDER ARTHROPLASTY Left 07/22/2018   Procedure: REVERSE SHOULDER ARTHROPLASTY;  Surgeon: Bjorn PippinVarkey, Dax T, MD;  Location: [facility];  Service: Orthopedics;  Laterality: Left;   RIGHT/LEFT HEART CATH AND CORONARY ANGIOGRAPHY N/A 07/05/2019   Procedure: RIGHT/LEFT HEART CATH AND CORONARY ANGIOGRAPHY;  Surgeon: Kathleene HazelMcAlhany, Christopher D, MD;  Location: [facility] INVASIVE CV LAB;  Service: Cardiovascular;  Laterality: N/A;   TONSILLECTOMY     TRANSCATHETER AORTIC VALVE REPLACEMENT, TRANSFEMORAL N/A 08/03/2019   Procedure: TRANSCATHETER AORTIC VALVE REPLACEMENT, TRANSFEMORAL;  Surgeon: Kathleene HazelMcAlhany, Christopher D, MD;  Location: [facility];  Service: Open Heart Surgery;  Laterality: N/A;   VEIN LIGATION      Current Medications: Outpatient Medications Prior to Visit  Medication Sig Dispense Refill   apixaban (ELIQUIS) 5 MG TABS tablet Take 1 tablet (5 mg total) by mouth 2 (two) times daily. 60 tablet 6   bisoprolol-hydrochlorothiazide (ZIAC) 5-6.25 MG tablet Take 1 tablet by mouth daily.     carbidopa-levodopa (SINEMET IR) 25-100 MG tablet 2 AT 8AM, 2 AT NOON, 1 AT 4PM, AND 1 AT 8PM; may take extra prn 630 tablet 1   cephALEXin (KEFLEX) 500 MG capsule Take 2,000 mg (4 capsules) one hour to all dental visits. 8 capsule 11   cholecalciferol (VITAMIN D) 25 MCG (1000 UNIT) tablet Take 1,000 Units by mouth daily.     Collagen-Boron-Hyaluronic Acid ([employer] JOINT HEALTH TRIPLE ACTION PO) Take 1 tablet by mouth daily.  Triple Action Joint Health     Cyanocobalamin (VITAMIN B-12) 5000 MCG TBDP Take 5,000 mcg by mouth daily.     docusate sodium (COLACE) 100 MG capsule Take 200 mg by mouth daily as needed (constipation.).      esomeprazole (NEXIUM) 20 MG capsule Take 20 mg by mouth daily.      levothyroxine (SYNTHROID, LEVOTHROID) 75 MCG tablet Take 75 mcg by mouth daily before breakfast.      Menthol-Methyl Salicylate (SALONPAS PAIN RELIEF PATCH) PTCH Place 1 patch onto the skin daily as needed (pain.).     Multiple Vitamins-Minerals (PRESERVISION AREDS PO) Take 1 tablet by mouth daily.      Omega-3 Fatty Acids (FISH OIL) 1200 MG CAPS Take 1,200 mg by mouth daily.     Polyethyl Glycol-Propyl Glycol (SYSTANE OP) Place 1 drop into both eyes in the morning and at bedtime.     aspirin EC 81 MG tablet Take 81 mg by mouth  daily. Swallow whole.     No facility-administered medications prior to visit.     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  Tobacco Use   Smoking status: Never   Smokeless tobacco: Never  Vaping Use   Vaping Use: Never used  Substance and Sexual Activity   Alcohol use: Never   Drug use: Never   Sexual activity: Not on file  Other Topics Concern   Not on file  Social History Narrative   Not on file   Social Determinants of Health   Financial Resource Strain: Not on file  Food Insecurity: Not on file  Transportation Needs: Not on file  Physical Activity: Not on file  Stress: Not on file  Social Connections: Not on file     Family History:  The patient's family history includes Healthy in her sister and son; Heart failure in her father.     ROS:   Please see the  history of present illness.    ROS All other systems reviewed and are negative.   PHYSICAL EXAM:   VS:  BP 140/80 (BP Location: Left Arm, Patient Position: Sitting, Cuff Size: Large)   Pulse (!) 102   Ht 5\' 2"  (1.575 m)   Wt 280 lb (127 kg)   SpO2 95%   BMI 51.21 kg/m    GEN: Well nourished, well developed, in no acute distress, obese HEENT: normal Neck: no JVD or masses Cardiac: irreg irreg; no murmurs, rubs, or gallops,no edema  Respiratory:  clear to auscultation bilaterally, normal work of breathing GI: soft, nontender, nondistended, + BS MS: no deformity or atrophy Skin: warm and dry, no rash Neuro:  Alert and Oriented x 3, Strength and sensation are intact Psych: euthymic mood, full affect   Wt Readings from Last 3 Encounters:  08/03/20 280 lb (127 kg)  05/19/20 274 lb (124.3 kg)  01/18/20 273 lb (123.8 kg)      Studies/Labs Reviewed:   EKG:  EKG is ordered today.  This shows aifb with mild RVR, HR 102  Recent Labs: 11/26/2019: BUN 14; Creatinine, Ser 0.84; Hemoglobin 15.1; Platelets 129; Potassium 4.3; Sodium 143   Lipid Panel No results found for: CHOL, TRIG, HDL, CHOLHDL, VLDL, LDLCALC, LDLDIRECT  Additional studies/ records that were reviewed today include:  TAVR OPERATIVE NOTE     Date of Procedure:                08/03/2019   Preoperative Diagnosis:      Severe Aortic Stenosis    Postoperative Diagnosis:    Same    Procedure:        Transcatheter Aortic Valve Replacement - Percutaneous Right Transfemoral Approach             Edwards Sapien 3 Ultra THV (size 26 mm, model # 9750TFX, serial # 10/03/2019)              Co-Surgeons:                        [id number]. Salvatore Decent, MD and Cornelius Moras, MD   Anesthesiologist:  Rosezella Florida, MD   Echocardiographer:              Charlton Haws, MD   Pre-operative Echo Findings: Severe aortic stenosis Normal left ventricular systolic function   Post-operative Echo Findings: No  paravalvular leak Normal left ventricular systolic function   _____________   Echo 08/04/19: IMPRESSIONS  1. Left ventricular ejection fraction, by estimation, is 60 to 65%. The  left ventricle has normal function. The left ventricle has no regional  wall motion abnormalities. Left ventricular diastolic parameters are  indeterminate.   2. Right ventricular systolic function is normal. The right ventricular  size is normal.   3. The mitral valve is normal in structure. No evidence of mitral valve  regurgitation. No evidence of mitral stenosis.   4. Post TAVR with 26 mm Sapien 3 valve No PVL peak velocity 2.2 m/sec  peak gradient 19 mmHg mean 10 mmHg AVA 2.3 cm2 DVI 0.54 . The aortic valve  has been repaired/replaced. Aortic valve regurgitation is not visualized.  No aortic stenosis is present.  There is a 26 mm Edwards Sapien prosthetic (TAVR) valve present in the  aortic position. Procedure Date: 08/03/2019.   5. The inferior vena cava is normal in size with greater than 50%  respiratory variability, suggesting right atrial pressure of 3 mmHg.   _________________  Echo 09/09/19 IMPRESSIONS  1. Left ventricular ejection fraction, by estimation, is 65 to 70%. The left ventricle has normal function. The left ventricle has no regional wall motion abnormalities. There is mild left ventricular hypertrophy. Left ventricular diastolic function  could not be evaluated.  2. Right ventricular systolic function is low normal. The right ventricular size is normal. There is normal pulmonary artery systolic pressure.  3. The aortic valve has been repaired/replaced. Aortic valve regurgitation is not visualized. There is a 26 mm Edwards Sapien prosthetic (TAVR) valve present in the aortic position. Echo findings are consistent with normal structure and function of the aortic valve prosthesis. Aortic valve area, by VTI measures 1.49 cm. Aortic valve mean gradient measures 6.5 mmHg. Aortic valve Vmax  measures 1.96 m/s. Peak gradient 15 mmHg.  4. The inferior vena cava is normal in size with greater than 50% respiratory variability, suggesting right atrial pressure of 3 mmHg.  5. The mitral valve is grossly normal. Trivial mitral valve regurgitation.   Comparison(s): Changes from prior study are noted. 08/04/2019: LVEF 60-65%, 26 mm ES3 valve, mean gradient 10 mmHg, peak gradient 19 mmHg.   ______________________  Echo 08/03/20 IMPRESSIONS   1. Left ventricular ejection fraction, by estimation, is 60 to 65%. The  left ventricle has normal function. The left ventricle has no regional  wall motion abnormalities. There is moderate asymmetric left ventricular  hypertrophy of the basal-septal  segment. Left ventricular diastolic parameters are indeterminate.   2. Right ventricular systolic function is normal. The right ventricular  size is normal.   3. The mitral valve is normal in structure. No evidence of mitral valve  regurgitation. No evidence of mitral stenosis.   4. There is a 26 mm Edwards Sapien prosthetic, stented (TAVR) valve  present in the aortic position. Aortic valve regurgitation is not  visualized. Procedure Date: 08/15/19. MG 10 mmHg, Vmax 2.1 m/s, DI 0.41.   5. Aortic dilatation noted. There is mild dilatation of the ascending  aorta, measuring 38 mm.  ASSESSMENT & PLAN:   Severe AS s/p TAVR:Echo today shows EF 60%, normally functioning TAVR with a mean gradient of 10 mmHg  and no PVL. She has NYHA class III symptoms. She is mostly limited by fatigue and dyspnea. She thinks this is related to parkinson's. She has Keflex for SBE prophylaxis. Still on aspirin and Eliquis. She can stop aspirin at this time. Continue regular follow up with Dr. Jens Som  Persistent atrial fibrillation: has gone back and forth on a rhythm vs rate control strategy. Followed in afib clinic. Continue Eliquis. She would like to continue with rate control for now.    HTN:BP borderline today. No changes  made.    Chronic diastolic CHF: Appears euvolemic.  No changes made   Medication Adjustments/Labs and Tests Ordered: Current medicines are reviewed at length with the patient today.  Concerns regarding medicines are outlined above.  Medication changes, Labs and Tests ordered today are listed in the Patient Instructions below. Patient Instructions  Medication Instructions:  1) STOP ASPIRIN *If you need a refill on your cardiac medications before your next appointment, please call your pharmacy*   Follow-Up: At [facility], you and your health needs are our priority.  As part of our continuing mission to provide you with exceptional heart care, we have created designated Provider Care Teams.  These Care Teams include your primary Cardiologist (physician) and Advanced Practice Providers (APPs -  Physician Assistants and Nurse Practitioners) who all work together to provide you with the care you need, when you need it. Your next appointment:   6 month(s) The format for your next appointment:   In Person Provider:   You may see [name], MD or one of the following Advanced Practice Providers on your designated Care Team:   Marjie Skiff, PA-C Edd Fabian, FNP     Signed, Cline Crock, PA-C  08/04/2020 2:24 PM    [facility] [address] [place], [place], [place]  [address] Phone: [phone]; Fax: [phone]

## 2020-08-07 DIAGNOSIS — Z1211 Encounter for screening for malignant neoplasm of colon: Secondary | ICD-10-CM | POA: Diagnosis not present

## 2020-09-11 ENCOUNTER — Other Ambulatory Visit: Payer: Self-pay | Admitting: Physician Assistant

## 2020-09-11 NOTE — Telephone Encounter (Signed)
Prescription refill request for Eliquis received. Indication: afib  Last office visit: Janee Morn 08/03/2020 Scr:  0.84, 11/26/2019 Age:  70 yo  Weight: 127 kg

## 2020-11-18 ENCOUNTER — Other Ambulatory Visit: Payer: Self-pay | Admitting: Neurology

## 2020-11-18 DIAGNOSIS — G20 Parkinson's disease: Secondary | ICD-10-CM

## 2020-11-20 ENCOUNTER — Other Ambulatory Visit: Payer: Self-pay

## 2020-11-30 ENCOUNTER — Ambulatory Visit: Payer: Medicare PPO | Admitting: Neurology

## 2020-12-01 NOTE — Progress Notes (Signed)
Assessment/Plan:   1.  Parkinsons Disease   -Remains slightly underdosed with levodopa.  Does not want to increase the dose.  She will therefore continue on carbidopa/levodopa 25/100, 2/2/1/1.  Have offered to change to the CR version to see if that changes EDS but she does not want to do that right now (discussed today and last visit)  -discussed waking up by 10am (currently may be noon)  -Discussed importance of safe, cardiovascular exercise.    -Declines agonist therapy.  2.  Urinary frequency  -Has seen urology in the past  3.  Atrial fibrillation  -Failed cardioversion attempt in the past.  -On Eliquis  4.  Adjustment d/o  -due to recent death of son  -declines antidepressant.  She will let me know if she changes her mind.  Counseling certainly could be of benefit, but she just does not think she wants that right now Subjective:   Cindy Richmond was seen today in follow up for Parkinsons disease.  My previous records were reviewed prior to todays visit as well as outside records available to me.  We previously identified that the patient was underdosed and recommended that she take her levodopa 2 tablets 4 times per day, but the patient has not wanted to do that, thinking that her leg heaviness was due to the Eliquis.  She is currently on carbidopa/levodopa 25/100, 2/2/1/1.  Unfortuntely, her son did die since our last visit.  He was only 72 y/o.  Awaiting autopsy.  That stress has driven more Parkinsons Disease symptoms.  She is not wanting to leave home.  She is having more trouble walking.  No falls but has had near falls.  She is not sleeping well.    Current prescribed movement disorder medications: carbidopa/levodopa 25/100, 2/2/1/1    ALLERGIES:   Allergies  Allergen Reactions   Nickel     Nickel earrings, ear holes get infected   Penicillins Other (See Comments)    unknown Did it involve swelling of the face/tongue/throat, SOB, or low BP? Unknown Did it involve  sudden or severe rash/hives, skin peeling, or any reaction on the inside of your mouth or nose? Unknown Did you need to seek medical attention at a hospital or doctor's office? Unknown When did it last happen?      newborn allergy If all above answers are "NO", may proceed with cephalosporin use.     CURRENT MEDICATIONS:  Outpatient Encounter Medications as of 12/04/2020  Medication Sig   bisoprolol-hydrochlorothiazide (ZIAC) 5-6.25 MG tablet Take 1 tablet by mouth daily.   carbidopa-levodopa (SINEMET IR) 25-100 MG tablet 2 AT 8AM, 2 AT NOON, 1 AT 4PM, AND 1 AT 8PM MAY TAKE EXTRA AS NEEDED   cephALEXin (KEFLEX) 500 MG capsule Take 2,000 mg (4 capsules) one hour to all dental visits.   cholecalciferol (VITAMIN D) 25 MCG (1000 UNIT) tablet Take 1,000 Units by mouth daily.   Collagen-Boron-Hyaluronic Acid ([employer] JOINT HEALTH TRIPLE ACTION PO) Take 1 tablet by mouth daily. Triple Action Joint Health   Cyanocobalamin (VITAMIN B-12) 5000 MCG TBDP Take 5,000 mcg by mouth daily.   docusate sodium (COLACE) 100 MG capsule Take 200 mg by mouth daily as needed (constipation.).    ELIQUIS 5 MG TABS tablet TAKE 1 TABLET BY MOUTH TWICE A DAY   esomeprazole (NEXIUM) 20 MG capsule Take 20 mg by mouth daily.    levothyroxine (SYNTHROID, LEVOTHROID) 75 MCG tablet Take 75 mcg by mouth daily before breakfast.  Menthol-Methyl Salicylate (SALONPAS PAIN RELIEF PATCH) PTCH Place 1 patch onto the skin daily as needed (pain.).   Multiple Vitamins-Minerals (PRESERVISION AREDS PO) Take 1 tablet by mouth daily.    Omega-3 Fatty Acids (FISH OIL) 1200 MG CAPS Take 1,200 mg by mouth daily.   Polyethyl Glycol-Propyl Glycol (SYSTANE OP) Place 1 drop into both eyes in the morning and at bedtime.   No facility-administered encounter medications on file as of 12/04/2020.    Objective:   PHYSICAL EXAMINATION:    VITALS:   Vitals:   12/04/20 1401  BP: 135/85  Pulse: 85  SpO2: 97%  Weight: 284 lb 6.4 oz (129 kg)   Height: 5\' 3"  (1.6 m)     GEN:  The patient appears stated age and is in NAD. HEENT:  Normocephalic, atraumatic.  The mucous membranes are moist. The superficial temporal arteries are without ropiness or tenderness. CV:  RRR Lungs:  CTAB.  There is some DOE Neck/HEME:  There are no carotid bruits bilaterally.  Neurological examination:  Orientation: The patient is alert and oriented x3. Cranial nerves: There is good facial symmetry with mild facial hypomimia. The speech is fluent and clear. Soft palate rises symmetrically and there is no tongue deviation. Hearing is intact to conversational tone. Sensation: Sensation is intact to light touch throughout Motor: Strength is at least antigravity x4.  Movement examination: Tone: There is nl tone today Abnormal movements:  rare dyskinesia in the R leg Coordination:  There is mild decremation with RAM's, with any form of RAMS, including alternating supination and pronation of the forearm, hand opening and closing, finger taps, heel taps and toe taps on the right Gait and Station: The patient pushes off to arise.  She walks fairly well with a cane with the exception of DOE.    I have reviewed and interpreted the following labs independently    Chemistry      Component Value Date/Time   NA 143 11/26/2019 1059   K 4.3 11/26/2019 1059   CL 101 11/26/2019 1059   CO2 24 11/26/2019 1059   BUN 14 11/26/2019 1059   CREATININE 0.84 11/26/2019 1059      Component Value Date/Time   CALCIUM 9.2 11/26/2019 1059   ALKPHOS 71 07/30/2019 0934   AST 14 (L) 07/30/2019 0934   ALT 7 07/30/2019 0934   BILITOT 0.8 07/30/2019 0934       Lab Results  Component Value Date   WBC 9.0 11/26/2019   HGB 15.1 11/26/2019   HCT 49.3 (H) 11/26/2019   MCV 83 11/26/2019   PLT 129 (L) 11/26/2019    Lab Results  Component Value Date   TSH 4.21 10/06/2006     Total time spent on today's visit was 22 minutes, including both face-to-face time and  nonface-to-face time.  Time included that spent on review of records (prior notes available to me/labs/imaging if pertinent), discussing treatment and goals, answering patient's questions and coordinating care.  Cc:  12/06/2006, MD

## 2020-12-04 ENCOUNTER — Encounter: Payer: Self-pay | Admitting: Neurology

## 2020-12-04 ENCOUNTER — Other Ambulatory Visit: Payer: Self-pay

## 2020-12-04 ENCOUNTER — Ambulatory Visit: Payer: Medicare PPO | Admitting: Neurology

## 2020-12-04 VITALS — BP 135/85 | HR 85 | Ht 63.0 in | Wt 284.4 lb

## 2020-12-04 DIAGNOSIS — F4321 Adjustment disorder with depressed mood: Secondary | ICD-10-CM

## 2020-12-04 DIAGNOSIS — G20 Parkinson's disease: Secondary | ICD-10-CM

## 2020-12-04 NOTE — Patient Instructions (Signed)
Online Resources for Power over Parkinson's Group October 2022  Local [place] Online Groups  Power over Parkinson's Group :   Power Over Parkinson's Patient Education Group will be Wednesday, October 12th-*Hybrid meting*- in person at [place] [facility] location and via WEBEX at 2:00 pm.   Upcoming Power over Parkinson's Meetings:  2nd Wednesdays of the month at 2 pm:  October 12th, November 9th Contact Amy Marriott at [email or web address] if interested in participating in this online group Parkinson's Care Partners Group:    3rd Mondays, Contact Misty Paladino Atypical Parkinsonian Patient Group:   4th Wednesdays, Contact Misty Paladino If you are interested in participating in these online groups with Misty, please contact her directly for how to join those meetings.  Her contact information is [email or web address].   Parkinson Foundation:  www.parkinson.org PD Health at Home continues:  Mindfulness Mondays, Expert Briefing Tuesdays, Wellness Wednesdays, Take Time Thursdays, Fitness Fridays -Listings for June 2022 are on the website Upcoming Webinar:  Expert Briefing:  Let's Talk about Dementia.  Wednesday, November 2nd  at 1 pm. Upcoming Webinar:  Understanding Gene and Cell-Based Therapies in Parkinson's.  Wednesday, October 5th at 1 pm Register for expert briefings (webinars) at https://www.[email or web address].org/resources-support/online-education/expert-briefings-webinars  Please check out their website to sign up for emails and see their full online offerings  [employer] Foundation:  www.michaeljfox.org  Upcoming Webinar:   Deep Brain Stimulation:  Is it Right for me or my Loved One?  Thursday, October 20th at 12 noon Check out additional information on their website to see their full online offerings  Davis Phinney Foundation:  www.davisphinneyfoundation.org Upcoming Webinar:  Living With and Managing Parkinson's Disease Psychosis.  Tuesday, October 18th at 3 pm.   Live Q & A:  Parkinson's Disease Psychosis.  Friday, October 28th at 3 pm. Care Partner Monthly Meetup.  With Connie Carpenter Phinney.  First Tuesday of each month, 2 pm Joy Breaks:  First Wednesday of each month, 2-3 pm. There will be art, doodling, making, crafting, listening, laughing, stories, and everything in between. No art experience necessary. No supplies required. Just show up for joy!  Register on their website. Check out additional information to Live Well Today on their website  Parkinson and Movement Disorders (PMD) Alliance:  www.pmdalliance.org [employer] Online:  Online Education Events Sign up for emails, which are sent weekly to give you updates on programming and online offerings  Parkinson's Association of [place]:  www.parkinsonassociation.org Information on online support groups, education events, and online exercises including Yoga, Parkinson's exercises and more-LOTS of information on links to PD resources and online events Virtual Support Group through Parkinson's Association of [place]; next one is scheduled for Wednesday, October 5th at 2 pm.  (These are typically scheduled for the 1st Wednesday of the month at 2 pm).  Visit website for details.  Additional links for movement activities: Parkinson's DRUMMING Classes/Music Therapy with Jane Maydian:  This is a returning class and it's FREE!  2nd Mondays, continuing October 10th.  Contact *Misty Taylor-Paladino at [email or web address] or Jane Maydian at [phone] or [email or web address]  PWR! Moves Classes at [facility] Exercise Room HAVE RESUMED!  Wednesdays 10 and 11 am.  Contact Amy Marriott, PT [email or web address] if interested Here is a link to the PWR!Moves classes on Zoom from [place] Parkinson's Foundation - Daily Mon-Sat at 10:00. Via Zoom, FREE and open to all.  There is also a link below via Facebook if you use that  platform. https://www.parkinsonsmi.org/mpf-programs/exercise-and-movement-activities https://www.facebook.com/ParkinsonsMI.org/posts/pwr-moves-exercise-class-parkinson-wellness-recovery-online-with-angee-ludwa-pt-/10156827878021813/ Parkinson's Wellness Recovery (PWR! Moves)  www.pwr4life.org Info   on the PWR! Virtual Experience:  You will have access to our expertise through self-assessment, guided plans that start with the PD-specific fundamentals, educational content, tips, Q&A with an expert, and a growing library of PD-specific pre-recorded and live exercise classes of varying types and intensity - both physical and cognitive! If that is not enough, we offer 1:1 wellness consultations (in-person or virtual) to personalize your PWR! Virtual Experience.  Parkinson Foundation Fitness Fridays:  As part of the PD Health @ Home program, this free video series focuses each week on one aspect of fitness designed to support people living with Parkinson's.  These weekly videos highlight the Parkinson Foundation recent fitness guidelines for people with Parkinson's disease.  www.parkinson.org/understanding-parkinsons/coronavirus/PD-health-at-home/Fitness-Fridays Dance for PD website is offering free, live-stream classes throughout the week, as well as links to digital library of classes:  https://danceforparkinsons.org/ Dance for Parkinson's Class:  [employer] [place].  Free offering for people with Parkinson's and care partners; virtual class.  For more information, contact [phone] or email Magalli Morana at [email or web address] Virtual dance and Pilates for Parkinson's classes: Click on the Community Tab> Parkinson's Movement Initiative Tab.  To register for classes and for more information, visit www.americandancefestival.org and click the "community" tab.  [employer] Parkinson's Cycling Classes  [employer]: 1pm on Fridays-Live classes at [employer] (Contact Margaret Hazen at  [email or web address] or [phone]) [employer]: Virtual Classes Mondays and Thursdays /Live classes Tuesday, Wednesday and Thursday (contact Marlee at [email or web address]  or [phone])  [place] [employer]ady [employer] Varied levels of classes are offered Mondays, Tuesdays and Thursdays at [employer] [facility] [facility].  To observe a class or for more information, call [phone] or email [email or web address] [employer]: Online Caregiver Education Opportunities:  www.well-springsolutions.org/caregiver-education/caregiver-support-group.  You may also contact Jodi Kolada at [email or web address] or [phone].   Powerful Tools for Caregivers:  6-week program beginning Thursday, October 13th.  This six-week educational series designed to provide family caregivers with practical tools to care for themselves while caring for a loved one Unlocking Dementia through Hope, Humor, and Understanding:  5-week series beginning September 7th [facility] Navigator:  Just1Navigator program, a free service to help individuals and families through the journey of determining care for older adults.  The "Navigator" is a social worker, Nicole Reynolds, who will speak with a prospective client and/or loved ones to provide an assessment of the situation and a set of recommendations for a personalized care plan -- all free of charge, and whether [facility] [employer] offers the needed service or not. If the need is not a service we provide, we are well-connected with reputable programs in town that we can refer you to.  www.well-springsolutions.org or to speak with the Navigator, call [phone].  

## 2020-12-18 ENCOUNTER — Other Ambulatory Visit: Payer: Self-pay | Admitting: Neurology

## 2020-12-18 ENCOUNTER — Telehealth: Payer: Self-pay | Admitting: Neurology

## 2020-12-18 MED ORDER — AMBULATORY NON FORMULARY MEDICATION: 0 refills | Status: DC

## 2020-12-18 NOTE — Telephone Encounter (Signed)
Called patient and informed her that Dr. Arbutus Leas has written the rx as requested. Informed patient of Dr. Don Perking advice that she may want to pay out of pocket for it as they are usually under $100.  MCR usually won't pay for regular WC or motorized within 3 years if they use insurance for this. Patient stated that she would like to use the Rx and will come to pick it up. Informed patient that I have placed her rx up front and provided patient with office hours.

## 2020-12-18 NOTE — Telephone Encounter (Signed)
Pt sister called to see if Tat will write an RX for a transport chair, so ins will help pay. Pt son recently passed away and they need it before Sunday. I let her know it usually takes 3-7 days for things to get done. She asked me to put a rush on it, as this is very important.

## 2020-12-20 ENCOUNTER — Telehealth: Payer: Self-pay | Admitting: Neurology

## 2020-12-20 NOTE — Telephone Encounter (Signed)
Sent to [employer] this morning and received a confirmation fax stating it was a success

## 2020-12-20 NOTE — Telephone Encounter (Signed)
Patient's sister came in the office wanting to make sure we get the information needed by the insurance to the DME company for the patient's transport chair. They are eager to get the chair.

## 2020-12-20 NOTE — Telephone Encounter (Signed)
[employer] sent me more paperwork stating that we need to either addend your notes from 12-04-20 or schedule and appointment for patient just to come in for wheelchair. Called [employer] and let them know that the patient only needs a transport wheelchair not a motorized [employer] let me know that there are 7 bullet points of information needed to approve patient for the wheelchair in regards to medicare. I called patients sister back read the original note that Dr. Arbutus Leas reccommended that she pay out of pocket for the wheelchair that it is around 100$ . Marland Kitchen When patient took script in to home health to get wheelchair they told her she could have it by the end of the week with the [facility]. That is not true and I have documented and saved all the paperwork associated with this transaction with [employer] .Patients sister was very understanding and appreciative of all the additional help but at his time they are going to just try to buy one.

## 2020-12-28 NOTE — Telephone Encounter (Signed)
Called [employer] back and let them know that the patient would not be needing the chair any longer family decided to get a 100 wheelchair and avoid all the things that [employer] was telling us we had to do

## 2021-01-29 NOTE — Progress Notes (Signed)
HPI: FU TAVR and atrial fibrillation. Carotid Dopplers March 2021 showed no significant stenosis.  Echocardiogram June 2021 showed normal LV function, mild mitral regurgitation, severe aortic stenosis with mean gradient 31 mmHg and aortic valve area 0.63 cm.  Cardiac catheterization May 2021 showed no coronary disease and severe aortic stenosis.  Had TAVR June 2021.  Last echocardiogram June 2022 showed normal LV function, moderate asymmetric septal hypertrophy, status post TAVR with 26 mm Edwards SAPIEN prosthetic valve and no aortic insufficiency; mean gradient 10 mmHg.  Since last seen, she has dyspnea on exertion unchanged.  No orthopnea, PND, pedal edema, chest pain or syncope.  Current Outpatient Medications  Medication Sig Dispense Refill   AMBULATORY NON FORMULARY MEDICATION Transport chair Dx: G20 1 Device 0   bisoprolol-hydrochlorothiazide (ZIAC) 5-6.25 MG tablet Take 1 tablet by mouth daily.     carbidopa-levodopa (SINEMET IR) 25-100 MG tablet 2 AT 8AM, 2 AT NOON, 1 AT 4PM, AND 1 AT 8PM MAY TAKE EXTRA AS NEEDED 630 tablet 1   cephALEXin (KEFLEX) 500 MG capsule Take 2,000 mg (4 capsules) one hour to all dental visits. 8 capsule 11   cholecalciferol (VITAMIN D) 25 MCG (1000 UNIT) tablet Take 1,000 Units by mouth daily.     Collagen-Boron-Hyaluronic Acid ([facility] JOINT HEALTH TRIPLE ACTION PO) Take 1 tablet by mouth daily. Triple Action Joint Health     Cyanocobalamin (VITAMIN B-12) 5000 MCG TBDP Take 5,000 mcg by mouth daily.     docusate sodium (COLACE) 100 MG capsule Take 200 mg by mouth daily as needed (constipation.).      ELIQUIS 5 MG TABS tablet TAKE 1 TABLET BY MOUTH TWICE A DAY 60 tablet 6   esomeprazole (NEXIUM) 20 MG capsule Take 20 mg by mouth daily.      levothyroxine (SYNTHROID, LEVOTHROID) 75 MCG tablet Take 75 mcg by mouth daily before breakfast.      Menthol-Methyl Salicylate (SALONPAS PAIN RELIEF PATCH) PTCH Place 1 patch onto the skin daily as needed (pain.).      Multiple Vitamins-Minerals (PRESERVISION AREDS PO) Take 1 tablet by mouth daily.      Omega-3 Fatty Acids (FISH OIL) 1200 MG CAPS Take 1,200 mg by mouth daily.     Polyethyl Glycol-Propyl Glycol (SYSTANE OP) Place 1 drop into both eyes in the morning and at bedtime.     No current facility-administered medications for this visit.     Past Medical History:  Diagnosis Date   Atrial fibrillation, persistent ([place])    Dyspnea    with head low, sleep apnea   Dysrhythmia    afib   GERD (gastroesophageal reflux disease)    H/O hiatal hernia    Hematuria    History of kidney stones    HOH (hard of hearing)    sightly, bilaterally   Hyperlipidemia    Hypertension    Hypothyroidism    Parkinson disease ([place])    S/P TAVR (transcatheter aortic valve replacement) 08/03/2019   s/p TAVR with 26 mm Edwards S3U via the TF approach by Drs Buena Irish & Roxy Manns   Severe aortic stenosis    Sleep apnea     Past Surgical History:  Procedure Laterality Date   ABDOMINAL HYSTERECTOMY     lso   CARDIOVERSION N/A 11/30/2019   Procedure: CARDIOVERSION;  Surgeon: Lelon Perla, MD;  Location: [place];  Service: Cardiovascular;  Laterality: N/A;   CYSTOSCOPY W/ RETROGRADES  02/28/2012   Procedure: CYSTOSCOPY WITH RETROGRADE PYELOGRAM;  Surgeon:  Alexis Frock, MD;  [facility];  Service: Urology;  Laterality: Right;   CYSTOSCOPY WITH STENT PLACEMENT  02/28/2012   Procedure: CYSTOSCOPY WITH STENT PLACEMENT;  Surgeon: Alexis Frock, MD;  [facility]  Service: Urology;  Laterality: Left;   CYSTOSCOPY/RETROGRADE/URETEROSCOPY/STONE EXTRACTION WITH BASKET  02/28/2012   Procedure: CYSTOSCOPY/RETROGRADE/URETEROSCOPY/STONE EXTRACTION WITH BASKET;  Surgeon: Alexis Frock, MD;  Location: [facility];  Service: Urology;  Laterality: Left;   HOLMIUM LASER APPLICATION  [id number]   Procedure: HOLMIUM LASER APPLICATION;  Surgeon: Alexis Frock, MD;   Location: [facility];  Service: Urology;  Laterality: Left;   INTRAOPERATIVE TRANSTHORACIC ECHOCARDIOGRAM  08/03/2019   Procedure: Intraoperative Transthoracic Echocardiogram;  Surgeon: Burnell Blanks, MD;  Location: [place];  Service: Open Heart Surgery;;   KNEE ARTHROSCOPY     right   REVERSE SHOULDER ARTHROPLASTY Left 07/22/2018   Procedure: REVERSE SHOULDER ARTHROPLASTY;  Surgeon: Hiram Gash, MD;  Location: [facility];  Service: Orthopedics;  Laterality: Left;   RIGHT/LEFT HEART CATH AND CORONARY ANGIOGRAPHY N/A 07/05/2019   Procedure: RIGHT/LEFT HEART CATH AND CORONARY ANGIOGRAPHY;  Surgeon: Burnell Blanks, MD;  Location: [facility] [facility];  Service: Cardiovascular;  Laterality: N/A;   TONSILLECTOMY     TRANSCATHETER AORTIC VALVE REPLACEMENT, TRANSFEMORAL N/A 08/03/2019   Procedure: TRANSCATHETER AORTIC VALVE REPLACEMENT, TRANSFEMORAL;  Surgeon: Burnell Blanks, MD;  Location: [place];  Service: Open Heart Surgery;  Laterality: N/A;   VEIN LIGATION      Social History   Socioeconomic History   Marital status: Widowed    Spouse name: Not on file   Number of children: 3   Years of education: Not on file   Highest education level: Associate degree: occupational, Hotel manager, or vocational program  Occupational History   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  Tobacco Use   Smoking status: Never   Smokeless tobacco: Never  Vaping Use   Vaping Use: Never used  Substance and Sexual Activity   Alcohol use: Never   Drug use: Never   Sexual activity: Not on file  Other Topics Concern   Not on file  Social History Narrative   Not on file   Social Determinants of Health   Financial Resource Strain: Not on file  Food Insecurity: Not on file  Transportation Needs: Not on file  Physical Activity: Not on file  Stress: Not on file  Social Connections: Not on file  Intimate Partner Violence: Not on file    Family History  Problem  Relation Age of Onset   Heart failure Father    Healthy Sister    Healthy Son     ROS: no fevers or chills, productive cough, hemoptysis, dysphasia, odynophagia, melena, hematochezia, dysuria, hematuria, rash, seizure activity, orthopnea, PND, pedal edema, claudication. Remaining systems are negative.  Physical Exam: Well-developed obese in no acute distress.  Skin is warm and dry.  HEENT is normal.  Neck is supple.  Chest is clear to auscultation with normal expansion.  Cardiovascular exam is irregular Abdominal exam nontender or distended. No masses palpated. Extremities show no edema. neuro grossly intact   A/P  1 status post TAVR-patient doing well from a symptomatic standpoint.  Continue SBE prophylaxis.  Recent echocardiogram showed normally functioning valve.  2 hypertension-blood pressure controlled.  Continue present medical regimen.  3 morbid obesity-we discussed the importance of weight loss.  4 dyspnea-this is felt to be multifactorial.  Aortic stenosis has now been  corrected.  There is also likely contribution from obesity hypoventilation syndrome, sleep apnea and deconditioning.  5 persistent atrial fibrillation-plan to continue apixaban.  Continue beta-blocker.  6 chronic diastolic congestive heart failure-she appears to be euvolemic today.  [name], MD

## 2021-02-05 ENCOUNTER — Encounter: Payer: Self-pay | Admitting: Cardiology

## 2021-02-05 ENCOUNTER — Ambulatory Visit: Payer: Medicare PPO | Admitting: Cardiology

## 2021-02-05 ENCOUNTER — Other Ambulatory Visit: Payer: Self-pay

## 2021-02-05 VITALS — BP 140/82 | HR 110 | Ht 63.0 in | Wt 282.4 lb

## 2021-02-05 DIAGNOSIS — I10 Essential (primary) hypertension: Secondary | ICD-10-CM | POA: Diagnosis not present

## 2021-02-05 DIAGNOSIS — I5032 Chronic diastolic (congestive) heart failure: Secondary | ICD-10-CM

## 2021-02-05 DIAGNOSIS — Z953 Presence of xenogenic heart valve: Secondary | ICD-10-CM | POA: Diagnosis not present

## 2021-02-05 DIAGNOSIS — I4819 Other persistent atrial fibrillation: Secondary | ICD-10-CM | POA: Diagnosis not present

## 2021-02-05 NOTE — Patient Instructions (Signed)

## 2021-02-23 DIAGNOSIS — H25813 Combined forms of age-related cataract, bilateral: Secondary | ICD-10-CM | POA: Diagnosis not present

## 2021-02-23 DIAGNOSIS — H04123 Dry eye syndrome of bilateral lacrimal glands: Secondary | ICD-10-CM | POA: Diagnosis not present

## 2021-02-23 DIAGNOSIS — H34232 Retinal artery branch occlusion, left eye: Secondary | ICD-10-CM | POA: Diagnosis not present

## 2021-02-23 DIAGNOSIS — H18593 Other hereditary corneal dystrophies, bilateral: Secondary | ICD-10-CM | POA: Diagnosis not present

## 2021-02-23 DIAGNOSIS — H353131 Nonexudative age-related macular degeneration, bilateral, early dry stage: Secondary | ICD-10-CM | POA: Diagnosis not present

## 2021-04-12 ENCOUNTER — Encounter: Payer: Self-pay | Admitting: Neurology

## 2021-04-13 ENCOUNTER — Other Ambulatory Visit: Payer: Self-pay | Admitting: Physician Assistant

## 2021-04-13 NOTE — Telephone Encounter (Signed)
Prescription refill request for Eliquis received. Indication:Afib Last office visit:12/22 Scr:0.9 Age: 71 Weight:128.1 kg  Prescription refilled

## 2021-04-16 ENCOUNTER — Other Ambulatory Visit: Payer: Self-pay

## 2021-04-16 DIAGNOSIS — G20A1 Parkinson's disease without dyskinesia, without mention of fluctuations: Secondary | ICD-10-CM

## 2021-04-16 DIAGNOSIS — G20 Parkinson's disease: Secondary | ICD-10-CM

## 2021-04-16 NOTE — Progress Notes (Unsigned)
Home health care has been sent for the patient to receive physical therapy [facility] Phone number [phone] Fax number [phone]

## 2021-04-27 DIAGNOSIS — H34232 Retinal artery branch occlusion, left eye: Secondary | ICD-10-CM | POA: Diagnosis not present

## 2021-04-27 DIAGNOSIS — H43821 Vitreomacular adhesion, right eye: Secondary | ICD-10-CM | POA: Diagnosis not present

## 2021-04-27 DIAGNOSIS — H35033 Hypertensive retinopathy, bilateral: Secondary | ICD-10-CM | POA: Diagnosis not present

## 2021-04-27 DIAGNOSIS — H353132 Nonexudative age-related macular degeneration, bilateral, intermediate dry stage: Secondary | ICD-10-CM | POA: Diagnosis not present

## 2021-05-08 NOTE — Progress Notes (Signed)
? ? ?Assessment/Plan:  ? ?1.  Parkinsons Disease  ? -Remains slightly underdosed with levodopa.  Does not want to increase the dose.  She will therefore continue on carbidopa/levodopa 25/100, 2/2/1/1.  ? -going to check cbc, UA, chem today.  While she has always had difficulty getting around, she noticed more trouble over the last few weeks.  We will check these labs to make sure no infection or electrolyte change.  UTI is common source of acute onset gait instability ? -refer to [place] ? -If above is negative, we are cautiously going to try pramipexole and work to 0.5 mg 3 times per day.  R/b/se discussed.  I gave her a titration schedule today, even though I did not prescribe it today.  I wanted to wait until labs came back. ? -We will call her after the labs are back and let her know about the pramipexole.  Plan on seeing her back in the next 6 to 8 weeks, at which point we may add bedtime levodopa, as she is having difficulty turning over in bed. ? ? ?2.  Urinary frequency ? -Has seen urology in the past ? ?3.  Atrial fibrillation ? -Failed cardioversion attempt in the past. ? -On Eliquis ? ?4.  Adjustment d/o ? -due to recent death of son ? -declines antidepressant.  She will let me know if she changes her mind.  Counseling certainly could be of benefit, but she just does not think she wants that right now ? ?5.  Dyspnea ? -Cardiology following.  Felt multifactorial due to obesity hypoventilation syndrome, sleep apnea and deconditioning. ?Subjective:  ? ?Cindy Richmond was seen today in follow up for Parkinsons disease.  My previous records were reviewed prior to todays visit as well as outside records available to me.  Patient emailed me in February stating that she was getting more weak and requested physical therapy.  We ordered that.  She states that this never happened.  She does not remember anybody calling her.  She is having more trouble turning in bed and more trouble with the R leg.  She has trouble  getting out the chair.  She feels stiff all over.  It got much worse 2 weeks ago - "i'm shuffling a lot more."  It is hard getting around her house because of that.  She did see cardiology since last visit.  That visit was in December.  I reviewed those notes.  She complained of dyspnea.  They reported they thought that was multifactorial due to obesity hypoventilation syndrome, sleep apnea and deconditioning.  She also c/o EDS - " I can just about sleep around the clock" ? ?Current prescribed movement disorder medications: ?carbidopa/levodopa 25/100, 2/2/1/1 ? ? ? ?ALLERGIES:   ?Allergies  ?Allergen Reactions  ? Nickel   ?  Nickel earrings, ear holes get infected  ? Penicillins Other (See Comments)  ?  unknown ?Did it involve swelling of the face/tongue/throat, SOB, or low BP? Unknown ?Did it involve sudden or severe rash/hives, skin peeling, or any reaction on the inside of your mouth or nose? Unknown ?Did you need to seek medical attention at a hospital or doctor's office? Unknown ?When did it last happen?      newborn allergy ?If all above answers are ?NO?, may proceed with cephalosporin use. ?  ? ? ?CURRENT MEDICATIONS:  ?Outpatient Encounter Medications as of 05/10/2021  ?Medication Sig  ? AMBULATORY NON FORMULARY MEDICATION Transport chair ?Dx: G20  ? bisoprolol-hydrochlorothiazide (ZIAC) 5-6.25 MG  tablet Take 1 tablet by mouth daily.  ? carbidopa-levodopa (SINEMET IR) 25-100 MG tablet 2 AT 8AM, 2 AT NOON, 1 AT 4PM, AND 1 AT 8PM MAY TAKE EXTRA AS NEEDED  ? cephALEXin (KEFLEX) 500 MG capsule Take 2,000 mg (4 capsules) one hour to all dental visits.  ? cholecalciferol (VITAMIN D) 25 MCG (1000 UNIT) tablet Take 1,000 Units by mouth daily.  ? Collagen-Boron-Hyaluronic Acid ([employer] JO[facility] TRIPLE ACTION PO) Take 1 tablet by mouth daily. [facility]  ? Cyanocobalamin (VITAMIN B-12) 5000 MCG TBDP Take 5,000 mcg by mouth daily.  ? docusate sodium (COLACE) 100 MG capsule Take 200 mg by mouth daily as  needed (constipation.).   ? ELIQUIS 5 MG TABS tablet TAKE 1 TABLET BY MOUTH TWICE A DAY  ? esomeprazole (NEXIUM) 20 MG capsule Take 20 mg by mouth daily.   ? levothyroxine (SYNTHROID, LEVOTHROID) 75 MCG tablet Take 75 mcg by mouth daily before breakfast.   ? Menthol-Methyl Salicylate (SALONPAS PAIN RELIEF PATCH) PTCH Place 1 patch onto the skin daily as needed (pain.).  ? Multiple Vitamins-Minerals (PRESERVISION AREDS PO) Take 1 tablet by mouth daily.   ? Omega-3 Fatty Acids (FISH OIL) 1200 MG CAPS Take 1,200 mg by mouth daily.  ? Polyethyl Glycol-Propyl Glycol (SYSTANE OP) Place 1 drop into both eyes in the morning and at bedtime.  ? ?No facility-administered encounter medications on file as of 05/10/2021.  ? ? ?Objective:  ? ?PHYSICAL EXAMINATION:   ? ?VITALS:   ?Vitals:  ? 05/10/21 1306  ?BP: 115/72  ?Pulse: 72  ?SpO2: 96%  ?Weight: 282 lb (127.9 kg)  ?Height: 5\' 3"  (1.6 m)  ? ? ? ? ?GEN:  The patient appears stated age and is in NAD. ?HEENT:  Normocephalic, atraumatic.  The mucous membranes are moist. The superficial temporal arteries are without ropiness or tenderness. ?CV:  RRR ?Lungs:  CTAB.  There is some DOE, especially when she gets up to walk. ?Neck/HEME:  There are no carotid bruits bilaterally. ? ?Neurological examination: ? ?Orientation: The patient is alert and oriented x3. ?Cranial nerves: There is good facial symmetry with mild facial hypomimia. The speech is fluent and clear. Soft palate rises symmetrically and there is no tongue deviation. Hearing is intact to conversational tone. ?Sensation: Sensation is intact to light touch throughout ?Motor: Strength is at least antigravity x4. ? ?Movement examination: ?Tone: There is nl tone today ?Abnormal movements: No dyskinesia today. ?Coordination:  There is mild decremation with RAM's, with any form of RAMS, including alternating supination and pronation of the forearm, hand opening and closing, finger taps, heel taps and toe taps on the right ?Gait and  Station: The patient pushes off to arise.  She has significant difficulty with walking because of shuffling and short steps. ? ?I have reviewed and interpreted the following labs independently ? ?  Chemistry   ?   ?Component Value Date/Time  ? NA 143 11/26/2019 1059  ? K 4.3 11/26/2019 1059  ? CL 101 11/26/2019 1059  ? CO2 24 11/26/2019 1059  ? BUN 14 11/26/2019 1059  ? CREATININE 0.84 11/26/2019 1059  ?    ?Component Value Date/Time  ? CALCIUM 9.2 11/26/2019 1059  ? ALKPHOS 71 07/30/2019 0934  ? AST 14 (L) 07/30/2019 0934  ? ALT 7 07/30/2019 0934  ? BILITOT 0.8 07/30/2019 0934  ?  ? ? ? ?Lab Results  ?Component Value Date  ? WBC 9.0 11/26/2019  ? HGB 15.1 11/26/2019  ? HCT  49.3 (H) 11/26/2019  ? MCV 83 11/26/2019  ? PLT 129 (L) 11/26/2019  ? ? ?Lab Results  ?Component Value Date  ? TSH 4.21 10/06/2006  ? ? ? ?Total time spent on today's visit was 30 minutes, including both face-to-face time and nonface-to-face time.  Time included that spent on review of records (prior notes available to me/labs/imaging if pertinent), discussing treatment and goals, answering patient's questions and coordinating care. ? ?Cc:  Jonathon Jordan, MD ? ?

## 2021-05-10 ENCOUNTER — Encounter: Payer: Self-pay | Admitting: Neurology

## 2021-05-10 ENCOUNTER — Ambulatory Visit: Payer: Medicare PPO | Admitting: Neurology

## 2021-05-10 ENCOUNTER — Other Ambulatory Visit: Payer: Self-pay

## 2021-05-10 ENCOUNTER — Other Ambulatory Visit (INDEPENDENT_AMBULATORY_CARE_PROVIDER_SITE_OTHER): Payer: Medicare PPO

## 2021-05-10 VITALS — BP 115/72 | HR 72 | Ht 63.0 in | Wt 282.0 lb

## 2021-05-10 DIAGNOSIS — G20 Parkinson's disease: Secondary | ICD-10-CM | POA: Diagnosis not present

## 2021-05-10 LAB — CBC
HCT: 42.5 % (ref 36.0–46.0)
Hemoglobin: 13.7 g/dL (ref 12.0–15.0)
MCHC: 32.3 g/dL (ref 30.0–36.0)
MCV: 83.5 fl (ref 78.0–100.0)
Platelets: 111 10*3/uL — ABNORMAL LOW (ref 150.0–400.0)
RBC: 5.09 Mil/uL (ref 3.87–5.11)
RDW: 16.7 % — ABNORMAL HIGH (ref 11.5–15.5)
WBC: 7.1 10*3/uL (ref 4.0–10.5)

## 2021-05-10 LAB — BASIC METABOLIC PANEL
BUN: 18 mg/dL (ref 6–23)
CO2: 31 mEq/L (ref 19–32)
Calcium: 9.3 mg/dL (ref 8.4–10.5)
Chloride: 103 mEq/L (ref 96–112)
Creatinine, Ser: 0.79 mg/dL (ref 0.40–1.20)
GFR: 74.32 mL/min (ref >60.00)
Glucose, Bld: 101 mg/dL — ABNORMAL HIGH (ref 70–99)
Potassium: 3.9 mEq/L (ref 3.5–5.1)
Sodium: 142 mEq/L (ref 135–145)

## 2021-05-10 NOTE — Patient Instructions (Addendum)
We aren't going to start pramipexole yet but I may call you tomorrow to start it.  If so, we will start mirapex (pramipexole) as follows:  0.125 mg - 1 tablet three times per day for a week, then 2 tablets three times per day for a week and then fill the 0.5 mg tablet and take that, 1 pill three times per day  ? ?Your provider has requested that you have labwork completed today. The lab is located on the Second floor at [address], within the [facility] office. When you get off the elevator, turn right and go in the [facility] [address]; the first brown door on the left.  Tell the ladies behind the desk that you are there for lab work. If you are not called within 15 minutes please check with the front desk.  ? ?Once you complete your labs you are free to go. You will receive a call or message via MyChart with your lab results.   ? ?

## 2021-05-15 LAB — MICROSCOPIC EXAMINATION
Casts: NONE SEEN /lpf
Epithelial Cells (non renal): NONE SEEN /hpf (ref 0–10)

## 2021-05-15 LAB — UA/M W/RFLX CULTURE, COMP
Bilirubin, UA: NEGATIVE
Glucose, UA: NEGATIVE
Nitrite, UA: POSITIVE — AB
Protein,UA: NEGATIVE
RBC, UA: NEGATIVE
Specific Gravity, UA: 1.023 (ref 1.005–1.030)
Urobilinogen, Ur: 1 mg/dL (ref 0.2–1.0)
pH, UA: 5.5 (ref 5.0–7.5)

## 2021-05-15 LAB — URINE CULTURE, COMPREHENSIVE

## 2021-05-16 ENCOUNTER — Telehealth: Payer: Self-pay

## 2021-05-16 NOTE — Telephone Encounter (Signed)
Called patient to make sure she is following up with PCP for UTI and also to discuss physical therapy ( patient asking for Home health but patient is not home bound and currently drives) ?

## 2021-05-30 ENCOUNTER — Other Ambulatory Visit: Payer: Self-pay | Admitting: Neurology

## 2021-05-30 DIAGNOSIS — G20 Parkinson's disease: Secondary | ICD-10-CM

## 2021-06-05 ENCOUNTER — Ambulatory Visit: Payer: Medicare PPO | Admitting: Neurology

## 2021-06-20 NOTE — Progress Notes (Deleted)
Assessment/Plan:   1.  Parkinsons Disease   -Continue carbidopa/levodopa 25/100, 2/2/1/1  -When I saw her in the office last month, she was not doing well, and lab work demonstrated urinary tract infection.  She is to follow-up with primary care, but has not yet done that.  Discussed with her that urinary tract infections, gone untreated, can cause sepsis and even death.  -***Add carbidopa/levodopa 50/200 CR at bedtime to help with turning over in the bed at night.   2.  Urinary frequency  -Has seen urology in the past  3.  Atrial fibrillation  -Failed cardioversion attempt in the past.  -On Eliquis  4.  Adjustment d/o  -due to recent death of son  -declines antidepressant.  She will let me know if she changes her mind.  Counseling certainly could be of benefit, but she just does not think she wants that right now  5.  Dyspnea  -Cardiology following.  Felt multifactorial due to obesity hypoventilation syndrome, sleep apnea and deconditioning. Subjective:   Cindy Richmond was seen today in follow up for Parkinsons disease.  My previous records were reviewed prior to todays visit as well as outside records available to me.  Patient was in March and was not doing well.  She was worked in for a follow-up appointment in April, but no showed to that appointment.  We checked lab work last visit and it turns out that she had a urinary tract infection.  We called her several times about following up with primary care for that infection, but I do not see she ever got a visit.  Urine culture was positive for greater than 100,000 E. coli.  We also [facility].  They called her and she declined to schedule.  Current prescribed movement disorder medications: carbidopa/levodopa 25/100, 2/2/1/1    ALLERGIES:   Allergies  Allergen Reactions   Nickel     Nickel earrings, ear holes get infected   Penicillins Other (See Comments)    unknown Did it  involve swelling of the face/tongue/throat, SOB, or low BP? Unknown Did it involve sudden or severe rash/hives, skin peeling, or any reaction on the inside of your mouth or nose? Unknown Did you need to seek medical attention at a hospital or doctor's office? Unknown When did it last happen?      newborn allergy If all above answers are "NO", may proceed with cephalosporin use.     CURRENT MEDICATIONS:  Outpatient Encounter Medications as of 06/22/2021  Medication Sig   AMBULATORY NON FORMULARY MEDICATION Transport chair Dx: G20   bisoprolol-hydrochlorothiazide (ZIAC) 5-6.25 MG tablet Take 1 tablet by mouth daily.   carbidopa-levodopa (SINEMET IR) 25-100 MG tablet 2 AT 8AM, 2 AT NOON, 1 AT 4PM, AND 1 AT 8PM MAY TAKE EXTRA AS NEEDED   cephALEXin (KEFLEX) 500 MG capsule Take 2,000 mg (4 capsules) one hour to all dental visits.   cholecalciferol (VITAMIN D) 25 MCG (1000 UNIT) tablet Take 1,000 Units by mouth daily.   Collagen-Boron-Hyaluronic Acid ([employer] JOINT HEALTH TRIPLE ACTION PO) Take 1 tablet by mouth daily. [facility]   Cyanocobalamin (VITAMIN B-12) 5000 MCG TBDP Take 5,000 mcg by mouth daily.   docusate sodium (COLACE) 100 MG capsule Take 200 mg by mouth daily as needed (constipation.).    ELIQUIS 5 MG TABS tablet TAKE 1 TABLET BY MOUTH TWICE A DAY   esomeprazole (NEXIUM) 20 MG capsule Take 20 mg by  mouth daily.    levothyroxine (SYNTHROID, LEVOTHROID) 75 MCG tablet Take 75 mcg by mouth daily before breakfast.    Menthol-Methyl Salicylate (SALONPAS PAIN RELIEF PATCH) PTCH Place 1 patch onto the skin daily as needed (pain.).   Multiple Vitamins-Minerals (PRESERVISION AREDS PO) Take 1 tablet by mouth daily.    Omega-3 Fatty Acids (FISH OIL) 1200 MG CAPS Take 1,200 mg by mouth daily.   Polyethyl Glycol-Propyl Glycol (SYSTANE OP) Place 1 drop into both eyes in the morning and at bedtime.   No facility-administered encounter medications on file as of 06/22/2021.     Objective:   PHYSICAL EXAMINATION:    VITALS:   There were no vitals filed for this visit.     GEN:  The patient appears stated age and is in NAD. HEENT:  Normocephalic, atraumatic.  The mucous membranes are moist. The superficial temporal arteries are without ropiness or tenderness. CV:  RRR Lungs:  CTAB.  There is some DOE, especially when she gets up to walk. Neck/HEME:  There are no carotid bruits bilaterally.  Neurological examination:  Orientation: The patient is alert and oriented x3. Cranial nerves: There is good facial symmetry with mild facial hypomimia. The speech is fluent and clear. Soft palate rises symmetrically and there is no tongue deviation. Hearing is intact to conversational tone. Sensation: Sensation is intact to light touch throughout Motor: Strength is at least antigravity x4.  Movement examination: Tone: There is nl tone today Abnormal movements: No dyskinesia today. Coordination:  There is mild decremation with RAM's, with any form of RAMS, including alternating supination and pronation of the forearm, hand opening and closing, finger taps, heel taps and toe taps on the right Gait and Station: The patient pushes off to arise.  She has significant difficulty with walking because of shuffling and short steps.  I have reviewed and interpreted the following labs independently    Chemistry      Component Value Date/Time   NA 142 05/10/2021 1419   NA 143 11/26/2019 1059   K 3.9 05/10/2021 1419   CL 103 05/10/2021 1419   CO2 31 05/10/2021 1419   BUN 18 05/10/2021 1419   BUN 14 11/26/2019 1059   CREATININE 0.79 05/10/2021 1419      Component Value Date/Time   CALCIUM 9.3 05/10/2021 1419   ALKPHOS 71 07/30/2019 0934   AST 14 (L) 07/30/2019 0934   ALT 7 07/30/2019 0934   BILITOT 0.8 07/30/2019 0934       Lab Results  Component Value Date   WBC 7.1 05/10/2021   HGB 13.7 05/10/2021   HCT 42.5 05/10/2021   MCV 83.5 05/10/2021   PLT 111.0  (L) 05/10/2021    Lab Results  Component Value Date   TSH 4.21 10/06/2006     Total time spent on today's visit was 30 minutes, including both face-to-face time and nonface-to-face time.  Time included that spent on review of records (prior notes available to me/labs/imaging if pertinent), discussing treatment and goals, answering patient's questions and coordinating care.  Cc:  Mila Palmer, MD

## 2021-06-22 ENCOUNTER — Encounter: Payer: Self-pay | Admitting: Neurology

## 2021-06-22 ENCOUNTER — Telehealth: Payer: Medicare PPO | Admitting: Neurology

## 2021-06-22 DIAGNOSIS — Z029 Encounter for administrative examinations, unspecified: Secondary | ICD-10-CM

## 2021-07-10 ENCOUNTER — Telehealth: Payer: Medicare PPO | Admitting: Neurology

## 2021-07-12 NOTE — Progress Notes (Signed)
Assessment/Plan:   1.  Parkinsons Disease   -Continue carbidopa/levodopa 25/100, 2/2/1/1  -When I saw her in the office last month, she was not doing well, and lab work demonstrated urinary tract infection.  She is to follow-up with primary care, but has not yet done that.  Discussed with her that urinary tract infections, gone untreated, can cause sepsis and even death.  We will recheck her UA with culture and sensitivity.  Discussed with her that if this is positive, she needs to follow-up with primary care, as treatment of urinary tract infections is really out of my area of expertise.  -Would really like to see her in physical therapy, and be an active participant in her own health care.   2.  Urinary frequency  -Has seen urology in the past  3.  Atrial fibrillation  -Failed cardioversion attempt in the past.  -On Eliquis  4.  Dyspnea  -Cardiology following.  Felt multifactorial due to obesity hypoventilation syndrome, sleep apnea and deconditioning.  5.  L occipital neuralgia  -Declines nerve block  6.  Right leg pain  -Suspect a Baker's cyst as she is pointing into the popliteal fossa.  The leg is not swollen.  It is not erythematous.  She is already on Eliquis.  I did ask her to follow-up with her primary care physician.  7.  EDS  -Does not wear her CPAP and this is likely the source of the excessive daytime hypersomnolence  -Discussed morbidity and mortality associated with untreated sleep apnea.  -Discussed alternative treatments.  She reports she is not interested. Subjective:   Cindy Richmond was seen today in follow up for Parkinsons disease.  My previous records were reviewed prior to todays visit as well as outside records available to me.  She is with her sister who supplements the history.  Patient was seen in March and was not doing well.  She was worked in for a follow-up appointment in April, but no showed to that appointment.  We checked lab work last visit and  it turns out that she had a urinary tract infection.  We called her about following up with primary care for that infection, but I do not see she ever got a visit.  She states today that she did not know about getting an appointment (but it was documented in our chart that we talked with her).  Urine culture was positive for greater than 100,000 E. coli.   She states that she never did get to an appt with pcp.   We also referred [facility].  They called her and she declined to schedule.  She states today that she doesn't want to leave the home for therapy.  She feels that she is basically homebound.  She states that she isn't doing well over the last few weeks.  She denies falls but her daughter states that she had a fall 2 weeks ago and got up from the chair and fell.  She also c/o tenderness at the back of the neck.  She also c/o EDS.  She does not wear her CPAP.  She feels a tightness behind the right knee and wonders if she has a blood clot.  The leg is not swollen.  Current prescribed movement disorder medications: carbidopa/levodopa 25/100, 2/2/1/1 Carbidopa/levodopa 50/200 at bedtime   ALLERGIES:   Allergies  Allergen Reactions   Nickel     Nickel earrings, ear holes get infected  Penicillins Other (See Comments)    unknown Did it involve swelling of the face/tongue/throat, SOB, or low BP? Unknown Did it involve sudden or severe rash/hives, skin peeling, or any reaction on the inside of your mouth or nose? Unknown Did you need to seek medical attention at a hospital or doctor's office? Unknown When did it last happen?      newborn allergy If all above answers are "NO", may proceed with cephalosporin use.     CURRENT MEDICATIONS:  Outpatient Encounter Medications as of 07/16/2021  Medication Sig   AMBULATORY NON FORMULARY MEDICATION Transport chair Dx: G20   bisoprolol-hydrochlorothiazide (ZIAC) 5-6.25 MG tablet Take 1 tablet by mouth daily.    carbidopa-levodopa (SINEMET IR) 25-100 MG tablet 2 AT 8AM, 2 AT NOON, 1 AT 4PM, AND 1 AT 8PM MAY TAKE EXTRA AS NEEDED   cholecalciferol (VITAMIN D) 25 MCG (1000 UNIT) tablet Take 1,000 Units by mouth daily.   Cyanocobalamin (VITAMIN B-12) 5000 MCG TBDP Take 5,000 mcg by mouth daily.   ELIQUIS 5 MG TABS tablet TAKE 1 TABLET BY MOUTH TWICE A DAY   esomeprazole (NEXIUM) 20 MG capsule Take 20 mg by mouth daily.    levothyroxine (SYNTHROID, LEVOTHROID) 75 MCG tablet Take 75 mcg by mouth daily before breakfast.    Menthol-Methyl Salicylate (SALONPAS PAIN RELIEF PATCH) PTCH Place 1 patch onto the skin daily as needed (pain.).   Polyethyl Glycol-Propyl Glycol (SYSTANE OP) Place 1 drop into both eyes in the morning and at bedtime.   cephALEXin (KEFLEX) 500 MG capsule Take 2,000 mg (4 capsules) one hour to all dental visits. (Patient not taking: Reported on 07/16/2021)   Collagen-Boron-Hyaluronic Acid ([employer] JOINT HEALTH TRIPLE ACTION PO) Take 1 tablet by mouth daily. [facility] (Patient not taking: Reported on 07/16/2021)   docusate sodium (COLACE) 100 MG capsule Take 200 mg by mouth daily as needed (constipation.).  (Patient not taking: Reported on 07/16/2021)   Multiple Vitamins-Minerals (PRESERVISION AREDS PO) Take 1 tablet by mouth daily.  (Patient not taking: Reported on 07/16/2021)   Omega-3 Fatty Acids (FISH OIL) 1200 MG CAPS Take 1,200 mg by mouth daily. (Patient not taking: Reported on 07/16/2021)   No facility-administered encounter medications on file as of 07/16/2021.    Objective:   PHYSICAL EXAMINATION:    VITALS:   Vitals:   07/16/21 1057  BP: 120/80  Pulse: 94  SpO2: 96%  Weight: 270 lb (122.5 kg)  Height: 5\' 3"  (1.6 m)       GEN:  The patient appears stated age and is in NAD. HEENT:  Normocephalic, atraumatic.  The mucous membranes are moist. The superficial temporal arteries are without ropiness or tenderness. CV:  RRR Lungs:  CTAB.  There is some DOE, especially  when she gets up to walk.  This is stable compared to last visit. Neck/HEME:  There are no carotid bruits bilaterally.  Neurological examination:  Orientation: The patient is alert and oriented x3. Cranial nerves: There is good facial symmetry with mild facial hypomimia. The speech is fluent and clear. Soft palate rises symmetrically and there is no tongue deviation. Hearing is intact to conversational tone. Sensation: Sensation is intact to light touch throughout Motor: Strength is at least antigravity x4.  Movement examination: Tone: There is mild increased tone in the right upper/lower extremity. Abnormal movements: No dyskinesia today. Coordination:  There is mild decremation with RAM's, with any form of RAMS, including alternating supination and pronation of the forearm, hand opening and closing,  finger taps, heel taps and toe taps on the right Gait and Station: The patient pushes off to arise.  She she walks with a walker, but she is short stepped.  The right leg drags some.  I have reviewed and interpreted the following labs independently    Chemistry      Component Value Date/Time   NA 142 05/10/2021 1419   NA 143 11/26/2019 1059   K 3.9 05/10/2021 1419   CL 103 05/10/2021 1419   CO2 31 05/10/2021 1419   BUN 18 05/10/2021 1419   BUN 14 11/26/2019 1059   CREATININE 0.79 05/10/2021 1419      Component Value Date/Time   CALCIUM 9.3 05/10/2021 1419   ALKPHOS 71 07/30/2019 0934   AST 14 (L) 07/30/2019 0934   ALT 7 07/30/2019 0934   BILITOT 0.8 07/30/2019 0934       Lab Results  Component Value Date   WBC 7.1 05/10/2021   HGB 13.7 05/10/2021   HCT 42.5 05/10/2021   MCV 83.5 05/10/2021   PLT 111.0 (L) 05/10/2021    Lab Results  Component Value Date   TSH 4.21 10/06/2006     Total time spent on today's visit was 31 minutes, including both face-to-face time and nonface-to-face time.  Time included that spent on review of records (prior notes available to  me/labs/imaging if pertinent), discussing treatment and goals, answering patient's questions and coordinating care.  Cc:  Jonathon Jordan, MD

## 2021-07-16 ENCOUNTER — Other Ambulatory Visit: Payer: Medicare PPO

## 2021-07-16 ENCOUNTER — Encounter: Payer: Self-pay | Admitting: Neurology

## 2021-07-16 ENCOUNTER — Ambulatory Visit: Payer: Medicare PPO | Admitting: Neurology

## 2021-07-16 VITALS — BP 120/80 | HR 94 | Ht 63.0 in | Wt 270.0 lb

## 2021-07-16 DIAGNOSIS — G20 Parkinson's disease: Secondary | ICD-10-CM

## 2021-07-16 DIAGNOSIS — N390 Urinary tract infection, site not specified: Secondary | ICD-10-CM | POA: Diagnosis not present

## 2021-07-16 NOTE — Patient Instructions (Signed)
Your provider has requested that you have labwork completed today. The lab is located on the Second floor at [address], within the [facility] office. When you get off the elevator, turn right and go in the [facility] [address]; the first brown door on the left.  Tell the ladies behind the desk that you are there for lab work. If you are not called within 15 minutes please check with the front desk.   Once you complete your labs you are free to go. You will receive a call or message via MyChart with your lab results.    

## 2021-07-17 NOTE — Progress Notes (Unsigned)
Pt called and informed of lag results. Fax labs to Dr. Mila Palmer.

## 2021-07-19 LAB — URINALYSIS W MICROSCOPIC + REFLEX CULTURE
Bilirubin Urine: NEGATIVE
Glucose, UA: NEGATIVE
Hgb urine dipstick: NEGATIVE
Hyaline Cast: NONE SEEN /LPF
Ketones, ur: NEGATIVE
Nitrites, Initial: POSITIVE — AB
Protein, ur: NEGATIVE
RBC / HPF: NONE SEEN /HPF (ref 0–2)
Specific Gravity, Urine: 1.018 (ref 1.001–1.035)
pH: 6.5 (ref 5.0–8.0)

## 2021-07-19 LAB — CULTURE INDICATED

## 2021-07-19 LAB — URINE CULTURE
MICRO NUMBER:: 13429381
SPECIMEN QUALITY:: ADEQUATE

## 2021-07-24 ENCOUNTER — Ambulatory Visit: Payer: Medicare PPO | Admitting: Neurology

## 2021-07-24 NOTE — Progress Notes (Signed)
HPI: FU TAVR and atrial fibrillation. Carotid Dopplers March 2021 showed no significant stenosis.  Echocardiogram June 2021 showed normal LV function, mild mitral regurgitation, severe aortic stenosis with mean gradient 31 mmHg and aortic valve area 0.63 cm.  Cardiac catheterization May 2021 showed no coronary disease and severe aortic stenosis.  Had TAVR June 2021.  Last echocardiogram June 2022 showed normal LV function, moderate asymmetric septal hypertrophy, status post TAVR with 26 mm Edwards SAPIEN prosthetic valve and no aortic insufficiency; mean gradient 10 mmHg.  Since last seen, she has some dyspnea on exertion.  No orthopnea, PND, pedal edema, chest pain or syncope.  No bleeding.  Current Outpatient Medications  Medication Sig Dispense Refill   AMBULATORY NON FORMULARY MEDICATION Transport chair Dx: G20 1 Device 0   bisoprolol-hydrochlorothiazide (ZIAC) 5-6.25 MG tablet Take 1 tablet by mouth daily.     carbidopa-levodopa (SINEMET IR) 25-100 MG tablet 2 AT 8AM, 2 AT NOON, 1 AT 4PM, AND 1 AT 8PM MAY TAKE EXTRA AS NEEDED 210 tablet 0   cephALEXin (KEFLEX) 500 MG capsule Take 2,000 mg (4 capsules) one hour to all dental visits. 8 capsule 11   cholecalciferol (VITAMIN D) 25 MCG (1000 UNIT) tablet Take 1,000 Units by mouth daily.     Collagen-Boron-Hyaluronic Acid ([facility] TRIPLE ACTION PO) Take 1 tablet by mouth daily. Triple Action Joint Health     Cyanocobalamin (VITAMIN B-12) 5000 MCG TBDP Take 5,000 mcg by mouth daily.     docusate sodium (COLACE) 100 MG capsule Take 200 mg by mouth daily as needed (constipation.).     ELIQUIS 5 MG TABS tablet TAKE 1 TABLET BY MOUTH TWICE A DAY 60 tablet 6   esomeprazole (NEXIUM) 20 MG capsule Take 20 mg by mouth daily.      levothyroxine (SYNTHROID, LEVOTHROID) 75 MCG tablet Take 75 mcg by mouth daily before breakfast.      Menthol-Methyl Salicylate (SALONPAS PAIN RELIEF PATCH) PTCH Place 1 patch onto the skin daily as needed (pain.).      Multiple Vitamins-Minerals (PRESERVISION AREDS PO) Take 1 tablet by mouth daily.     Omega-3 Fatty Acids (FISH OIL) 1200 MG CAPS Take 1,200 mg by mouth daily.     Polyethyl Glycol-Propyl Glycol (SYSTANE OP) Place 1 drop into both eyes in the morning and at bedtime.     No current facility-administered medications for this visit.     Past Medical History:  Diagnosis Date   Atrial fibrillation, persistent ([place])    Dyspnea    with head low, sleep apnea   Dysrhythmia    afib   GERD (gastroesophageal reflux disease)    H/O hiatal hernia    Hematuria    History of kidney stones    HOH (hard of hearing)    sightly, bilaterally   Hyperlipidemia    Hypertension    Hypothyroidism    Parkinson disease ([place])    S/P TAVR (transcatheter aortic valve replacement) 08/03/2019   s/p TAVR with 26 mm Edwards S3U via the TF approach by Drs Buena Irish & Roxy Manns   Severe aortic stenosis    Sleep apnea     Past Surgical History:  Procedure Laterality Date   ABDOMINAL HYSTERECTOMY     lso   CARDIOVERSION N/A 11/30/2019   Procedure: CARDIOVERSION;  Surgeon: Lelon Perla, MD;  Location: [place];  Service: Cardiovascular;  Laterality: N/A;   CYSTOSCOPY W/ RETROGRADES  02/28/2012   Procedure: CYSTOSCOPY WITH RETROGRADE PYELOGRAM;  Surgeon: Alexis Frock, MD;  Location[facility];  Service: Urology;  Laterality: Right;   CYSTOSCOPY WITH STENT PLACEMENT  02/28/2012   Procedure: CYSTOSCOPY WITH STENT PLACEMENT;  Surgeon: Alexis Frock, MD;  Location: [facility];  Service: Urology;  Laterality: Left;   CYSTOSCOPY/RETROGRADE/URETEROSCOPY/STONE EXTRACTION WITH BASKET  02/28/2012   Procedure: CYSTOSCOPY/RETROGRADE/URETEROSCOPY/STONE EXTRACTION WITH BASKET;  Surgeon: Alexis Frock, MD;  Location: [facility];  Service: Urology;  Laterality: Left;   HOLMIUM LASER APPLICATION  [id number]   Procedure: HOLMIUM LASER APPLICATION;  Surgeon: Alexis Frock, MD;   Location: [facility];  Service: Urology;  Laterality: Left;   INTRAOPERATIVE TRANSTHORACIC ECHOCARDIOGRAM  08/03/2019   Procedure: Intraoperative Transthoracic Echocardiogram;  Surgeon: Burnell Blanks, MD;  Location: [place];  Service: Open Heart Surgery;;   KNEE ARTHROSCOPY     right   REVERSE SHOULDER ARTHROPLASTY Left 07/22/2018   Procedure: REVERSE SHOULDER ARTHROPLASTY;  Surgeon: Hiram Gash, MD;  Location: [facility];  Service: Orthopedics;  Laterality: Left;   RIGHT/LEFT HEART CATH AND CORONARY ANGIOGRAPHY N/A 07/05/2019   Procedure: RIGHT/LEFT HEART CATH AND CORONARY ANGIOGRAPHY;  Surgeon: Burnell Blanks, MD;  Location: [place] [facility];  Service: Cardiovascular;  Laterality: N/A;   TONSILLECTOMY     TRANSCATHETER AORTIC VALVE REPLACEMENT, TRANSFEMORAL N/A 08/03/2019   Procedure: TRANSCATHETER AORTIC VALVE REPLACEMENT, TRANSFEMORAL;  Surgeon: Burnell Blanks, MD;  Location: [place];  Service: Open Heart Surgery;  Laterality: N/A;   VEIN LIGATION      Social History   Socioeconomic History   Marital status: Widowed    Spouse name: Not on file   Number of children: 3   Years of education: Not on file   Highest education level: Associate degree: occupational, Hotel manager, or vocational program  Occupational History   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  Tobacco Use   Smoking status: Never   Smokeless tobacco: Never  Vaping Use   Vaping Use: Never used  Substance and Sexual Activity   Alcohol use: Never   Drug use: Never   Sexual activity: Not on file  Other Topics Concern   Not on file  Social History Narrative   Right handed   Lives alone   One story home one step up to home   Caffeine 2 cups   Social Determinants of Health   Financial Resource Strain: Not on file  Food Insecurity: Not on file  Transportation Needs: Not on file  Physical Activity: Not on file  Stress: Not on file  Social Connections: Not on file   Intimate Partner Violence: Not on file    Family History  Problem Relation Age of Onset   Heart failure Mother    Heart failure Father    Pneumonia Father    Healthy Sister    Healthy Son     ROS: Back pain but no fevers or chills, productive cough, hemoptysis, dysphasia, odynophagia, melena, hematochezia, dysuria, hematuria, rash, seizure activity, orthopnea, PND, pedal edema, claudication. Remaining systems are negative.  Physical Exam: Well-developed obese in no acute distress.  Skin is warm and dry.  HEENT is normal.  Neck is supple.  Chest is clear to auscultation with normal expansion.  Cardiovascular exam is irregular Abdominal exam nontender or distended. No masses palpated. Extremities show no edema. neuro grossly intact  ECG-atrial fibrillation at a rate of 98, left ventricular hypertrophy, RV conduction delay.  Personally reviewed  A/P  1 status post TAVR-plan to continue  SBE prophylaxis.  She does have some dyspnea though some of this is chronic.  Repeat echocardiogram to reassess LV function and valve function.  2 hypertension-blood pressure controlled.  Continue present medications and follow.  3 permanent atrial fibrillation-continue beta-blocker for rate control.  Continue apixaban.  4 chronic diastolic congestive heart failure-she is euvolemic on examination today.  5 dyspnea-felt to be multifactorial including contributions from obesity hypoventilation syndrome, sleep apnea and deconditioning.  As outlined above we will plan to repeat echocardiogram.  6 morbid obesity-we again discussed the importance of weight loss.  Kirk Ruths, MD

## 2021-07-27 DIAGNOSIS — G20 Parkinson's disease: Secondary | ICD-10-CM | POA: Diagnosis not present

## 2021-07-27 DIAGNOSIS — N390 Urinary tract infection, site not specified: Secondary | ICD-10-CM | POA: Diagnosis not present

## 2021-07-31 ENCOUNTER — Encounter: Payer: Self-pay | Admitting: Cardiology

## 2021-07-31 ENCOUNTER — Ambulatory Visit: Payer: Medicare PPO | Admitting: Cardiology

## 2021-07-31 VITALS — BP 128/70 | HR 98 | Ht 63.0 in | Wt 281.0 lb

## 2021-07-31 DIAGNOSIS — Z953 Presence of xenogenic heart valve: Secondary | ICD-10-CM

## 2021-07-31 DIAGNOSIS — I10 Essential (primary) hypertension: Secondary | ICD-10-CM

## 2021-07-31 DIAGNOSIS — R0602 Shortness of breath: Secondary | ICD-10-CM | POA: Diagnosis not present

## 2021-07-31 DIAGNOSIS — I4821 Permanent atrial fibrillation: Secondary | ICD-10-CM | POA: Diagnosis not present

## 2021-07-31 NOTE — Patient Instructions (Signed)
?  Testing/Procedures: ? ?Your physician has requested that you have an echocardiogram. Echocardiography is a painless test that uses sound waves to create images of your heart. It provides your doctor with information about the size and shape of your heart and how well your heart?s chambers and valves are working. This procedure takes approximately one hour. There are no restrictions for this procedure. [address] ? ? ?Follow-Up: ?At [facility], you and your health needs are our priority.  As part of our continuing mission to provide you with exceptional heart care, we have created designated Provider Care Teams.  These Care Teams include your primary Cardiologist (physician) and Advanced Practice Providers (APPs -  Physician Assistants and Nurse Practitioners) who all work together to provide you with the care you need, when you need it. ? ?We recommend signing up for the patient portal called "MyChart".  Sign up information is provided on this After Visit Summary.  MyChart is used to connect with patients for Virtual Visits (Telemedicine).  Patients are able to view lab/test results, encounter notes, upcoming appointments, etc.  Non-urgent messages can be sent to your provider as well.   ?To learn more about what you can do with MyChart, go to [email or web address].   ? ?Your next appointment:   ?12 month(s) ? ?The format for your next appointment:   ?In Person ? ?Provider:   ?Brian Crenshaw, MD   ? ? ? ? ?Important Information About Sugar ? ? ? ? ?  ?

## 2021-08-02 ENCOUNTER — Other Ambulatory Visit: Payer: Self-pay | Admitting: Family Medicine

## 2021-08-02 DIAGNOSIS — E2839 Other primary ovarian failure: Secondary | ICD-10-CM

## 2021-08-20 ENCOUNTER — Other Ambulatory Visit (HOSPITAL_COMMUNITY): Payer: Medicare PPO

## 2021-08-27 ENCOUNTER — Other Ambulatory Visit: Payer: Self-pay | Admitting: Neurology

## 2021-08-27 DIAGNOSIS — G20 Parkinson's disease: Secondary | ICD-10-CM

## 2021-08-29 ENCOUNTER — Ambulatory Visit (HOSPITAL_COMMUNITY): Payer: Medicare PPO | Attending: Cardiology

## 2021-08-29 DIAGNOSIS — Z953 Presence of xenogenic heart valve: Secondary | ICD-10-CM | POA: Diagnosis not present

## 2021-08-29 LAB — ECHOCARDIOGRAM COMPLETE
AR max vel: 1.64 cm2
AV Area VTI: 1.75 cm2
AV Area mean vel: 1.68 cm2
AV Mean grad: 11 mmHg
AV Peak grad: 17.7 mmHg
Ao pk vel: 2.1 m/s
Area-P 1/2: 5.38 cm2
S' Lateral: 2.2 cm

## 2021-09-03 DIAGNOSIS — R7301 Impaired fasting glucose: Secondary | ICD-10-CM | POA: Diagnosis not present

## 2021-09-03 DIAGNOSIS — E559 Vitamin D deficiency, unspecified: Secondary | ICD-10-CM | POA: Diagnosis not present

## 2021-09-03 DIAGNOSIS — Z1211 Encounter for screening for malignant neoplasm of colon: Secondary | ICD-10-CM | POA: Diagnosis not present

## 2021-09-03 DIAGNOSIS — I10 Essential (primary) hypertension: Secondary | ICD-10-CM | POA: Diagnosis not present

## 2021-09-03 DIAGNOSIS — Z79899 Other long term (current) drug therapy: Secondary | ICD-10-CM | POA: Diagnosis not present

## 2021-09-03 DIAGNOSIS — Z0000 Encounter for general adult medical examination without abnormal findings: Secondary | ICD-10-CM | POA: Diagnosis not present

## 2021-09-03 DIAGNOSIS — G20 Parkinson's disease: Secondary | ICD-10-CM | POA: Diagnosis not present

## 2021-09-03 DIAGNOSIS — E039 Hypothyroidism, unspecified: Secondary | ICD-10-CM | POA: Diagnosis not present

## 2021-09-12 DIAGNOSIS — K219 Gastro-esophageal reflux disease without esophagitis: Secondary | ICD-10-CM | POA: Diagnosis not present

## 2021-09-12 DIAGNOSIS — E559 Vitamin D deficiency, unspecified: Secondary | ICD-10-CM | POA: Diagnosis not present

## 2021-09-12 DIAGNOSIS — I4891 Unspecified atrial fibrillation: Secondary | ICD-10-CM | POA: Diagnosis not present

## 2021-09-12 DIAGNOSIS — M8580 Other specified disorders of bone density and structure, unspecified site: Secondary | ICD-10-CM | POA: Diagnosis not present

## 2021-09-12 DIAGNOSIS — G20 Parkinson's disease: Secondary | ICD-10-CM | POA: Diagnosis not present

## 2021-09-12 DIAGNOSIS — E7800 Pure hypercholesterolemia, unspecified: Secondary | ICD-10-CM | POA: Diagnosis not present

## 2021-09-12 DIAGNOSIS — K449 Diaphragmatic hernia without obstruction or gangrene: Secondary | ICD-10-CM | POA: Diagnosis not present

## 2021-09-12 DIAGNOSIS — I10 Essential (primary) hypertension: Secondary | ICD-10-CM | POA: Diagnosis not present

## 2021-09-12 DIAGNOSIS — M1990 Unspecified osteoarthritis, unspecified site: Secondary | ICD-10-CM | POA: Diagnosis not present

## 2021-10-18 ENCOUNTER — Other Ambulatory Visit: Payer: Self-pay

## 2021-10-18 NOTE — Patient Instructions (Signed)
Visit Information  Thank you for taking time to visit with me today. Please don't hesitate to contact me if I can be of assistance to you.   Following are the goals we discussed today:   Goals Addressed             This Visit's Progress    COMPLETED: Care Coordination Activities - no follow up required       Care Coordination Interventions: Provided education to patient re: Annual Wellness Visit, care coordination services Reviewed medications with patient and discussed adherence Assessed social determinant of health barriers Pt had Annual Wellness Visit 09/03/21 Reports coping with her Parkinson's Disease well           If you are experiencing a Mental Health or Behavioral Health Crisis or need someone to talk to, please call the Suicide and Crisis Lifeline: 988 call the [place] National Suicide Prevention Lifeline: 5022547705 or TTY: 903-264-4202 TTY (401) 723-3422) to talk to a trained counselor call 1-800-273-TALK (toll free, 24 hour hotline) go to [facility] [address], [place] [phone]) call 911   Patient verbalizes understanding of instructions and care plan provided today and agrees to view in MyChart. Active MyChart status and patient understanding of how to access instructions and care plan via MyChart confirmed with patient.     No further follow up required:   Dudley Major RN, Maximiano Coss, CDE Care Management Coordinator [facility] Care Management [phone]

## 2021-10-18 NOTE — Patient Outreach (Signed)
  Care Coordination   Initial Visit Note   10/18/2021 Name: Cindy Richmond MRN: [id number] DOB: 1950/08/30  Cindy Richmond is a 71 y.o. year old female who sees Mila Palmer, MD for primary care. I spoke with  [name] by phone today  What matters to the patients health and wellness today?  I am doing well and I am doing OK with my Parkinson's     Goals Addressed             This Visit's Progress    COMPLETED: Care Coordination Activities - no follow up required       Care Coordination Interventions: Provided education to patient re: Annual Wellness Visit, care coordination services Reviewed medications with patient and discussed adherence Assessed social determinant of health barriers Pt had Annual Wellness Visit 09/03/21 Reports coping with her Parkinson's Disease well          SDOH assessments and interventions completed:  Yes  SDOH Interventions Today    Flowsheet Row Most Recent Value  SDOH Interventions   Food Insecurity Interventions Intervention Not Indicated  Financial Strain Interventions Intervention Not Indicated  Housing Interventions Intervention Not Indicated  Transportation Interventions Intervention Not Indicated        Care Coordination Interventions Activated:  Yes  Care Coordination Interventions:  Yes, provided   Follow up plan: No further intervention required.   Encounter Outcome:  Pt. Visit Completed  Dudley Major RN, BSN,CCM, CDE Care Management Coordinator [facility] Care Management [phone]

## 2021-11-18 ENCOUNTER — Encounter: Payer: Self-pay | Admitting: Neurology

## 2021-11-18 ENCOUNTER — Other Ambulatory Visit: Payer: Self-pay | Admitting: Physician Assistant

## 2021-11-18 DIAGNOSIS — I4819 Other persistent atrial fibrillation: Secondary | ICD-10-CM

## 2021-11-19 NOTE — Telephone Encounter (Signed)
Eliquis 5mg  refill request received. Patient is 71 years old, weight-127.5kg, Crea-0.79 on 05/10/2021, Diagnosis-Afib, and last seen by Dr. Stanford Breed on 07/31/2021. Dose is appropriate based on dosing criteria. Will send in refill to requested pharmacy.

## 2021-11-25 ENCOUNTER — Other Ambulatory Visit: Payer: Self-pay | Admitting: Neurology

## 2021-11-25 DIAGNOSIS — G20A1 Parkinson's disease without dyskinesia, without mention of fluctuations: Secondary | ICD-10-CM

## 2021-12-31 DIAGNOSIS — L02412 Cutaneous abscess of left axilla: Secondary | ICD-10-CM | POA: Diagnosis not present

## 2022-01-25 ENCOUNTER — Inpatient Hospital Stay: Admission: RE | Admit: 2022-01-25 | Payer: Medicare PPO | Source: Ambulatory Visit

## 2022-01-28 NOTE — Progress Notes (Unsigned)
Virtual Visit Via Video       Consent was obtained for video visit:  {yes no:[id number]} Answered questions that patient had about telehealth interaction:  {yes no:314532} I discussed the limitations, risks, security and privacy concerns of performing an evaluation and management service by telemedicine. I also discussed with the patient that there may be a patient responsible charge related to this service. The patient expressed understanding and agreed to proceed.  Pt location: Home Physician Location: office Name of referring provider:  Mila Palmer, MD I connected with Cindy Richmond at patients initiation/request on 01/29/2022 at  2:30 PM EST by video enabled telemedicine application and verified that I am speaking with the correct person using two identifiers. Pt MRN:  [id number] Pt DOB:  11-Mar-1950 Video Participants:  Cindy Richmond;  ***   Assessment/Plan:   1.  Parkinsons Disease   -Continue carbidopa/levodopa 25/100, 2/2/1/1  -When I saw her in the office last month, she was not doing well, and lab work demonstrated urinary tract infection.  She is to follow-up with primary care, but has not yet done that.  Discussed with her that urinary tract infections, gone untreated, can cause sepsis and even death.  We will recheck her UA with culture and sensitivity.  Discussed with her that if this is positive, she needs to follow-up with primary care, as treatment of urinary tract infections is really out of my area of expertise.  -Would really like to see her in physical therapy, and be an active participant in her own health care.   2.  Urinary frequency  -Has seen urology in the past  3.  Atrial fibrillation  -Failed cardioversion attempt in the past.  -On Eliquis  4.  Dyspnea  -Cardiology following.  Felt multifactorial due to obesity hypoventilation syndrome, sleep apnea and deconditioning.  5.  L occipital neuralgia  -Declines nerve block  6.  Right leg pain  -Suspect a  Baker's cyst as she is pointing into the popliteal fossa.  The leg is not swollen.  It is not erythematous.  She is already on Eliquis.  I did ask her to follow-up with her primary care physician.  7.  EDS  -Does not wear her CPAP and this is likely the source of the excessive daytime hypersomnolence  -Discussed morbidity and mortality associated with untreated sleep apnea.  -Discussed alternative treatments.  She reports she is not interested. Subjective:   Cindy Richmond was seen today in follow up for Parkinsons disease.  My previous records were reviewed prior to todays visit as well as outside records available to me.  She is with her sister who supplements the history.  Patient was last seen in May.  She had previously been worked in in March when she was not doing well and had a positive urine culture, but never got treatment.  She came in May and we talked about the importance of treatment.  We rechecked her urine and it was positive for greater than 100,000 E. coli.  We told her she needed to get an appointment with her primary care.  We faxed labs to her primary care as well.  She reports that ***.  Current prescribed movement disorder medications: carbidopa/levodopa 25/100, 2/2/1/1 Carbidopa/levodopa 50/200 at bedtime   ALLERGIES:   Allergies  Allergen Reactions   Nickel     Nickel earrings, ear holes get infected   Penicillins Other (See Comments)    unknown Did it involve swelling of the face/tongue/throat, SOB, or  low BP? Unknown Did it involve sudden or severe rash/hives, skin peeling, or any reaction on the inside of your mouth or nose? Unknown Did you need to seek medical attention at a hospital or doctor's office? Unknown When did it last happen?      newborn allergy If all above answers are "NO", may proceed with cephalosporin use.     CURRENT MEDICATIONS:  Outpatient Encounter Medications as of 01/29/2022  Medication Sig   AMBULATORY NON FORMULARY MEDICATION  Transport chair Dx: G20   apixaban (ELIQUIS) 5 MG TABS tablet TAKE 1 TABLET BY MOUTH TWICE A DAY   bisoprolol-hydrochlorothiazide (ZIAC) 5-6.25 MG tablet Take 1 tablet by mouth daily.   carbidopa-levodopa (SINEMET IR) 25-100 MG tablet 2 AT 8AM, 2 AT NOON, 1 AT 4PM, AND 1 AT 8PM MAY TAKE EXTRA AS NEEDED   cephALEXin (KEFLEX) 500 MG capsule Take 2,000 mg (4 capsules) one hour to all dental visits.   cholecalciferol (VITAMIN D) 25 MCG (1000 UNIT) tablet Take 1,000 Units by mouth daily.   Collagen-Boron-Hyaluronic Acid ([facility] JOINT [facility] TRIPLE ACTION PO) Take 1 tablet by mouth daily. Triple Action [facility] Health   Cyanocobalamin (VITAMIN B-12) 5000 MCG TBDP Take 5,000 mcg by mouth daily.   docusate sodium (COLACE) 100 MG capsule Take 200 mg by mouth daily as needed (constipation.).   esomeprazole (NEXIUM) 20 MG capsule Take 20 mg by mouth daily.    levothyroxine (SYNTHROID, LEVOTHROID) 75 MCG tablet Take 75 mcg by mouth daily before breakfast.    Menthol-Methyl Salicylate (SALONPAS PAIN RELIEF PATCH) PTCH Place 1 patch onto the skin daily as needed (pain.).   Multiple Vitamins-Minerals (PRESERVISION AREDS PO) Take 1 tablet by mouth daily.   Omega-3 Fatty Acids (FISH OIL) 1200 MG CAPS Take 1,200 mg by mouth daily.   Polyethyl Glycol-Propyl Glycol (SYSTANE OP) Place 1 drop into both eyes in the morning and at bedtime.   No facility-administered encounter medications on file as of 01/29/2022.    Objective:   PHYSICAL EXAMINATION:    VITALS:   There were no vitals filed for this visit.  GEN:  The patient appears stated age and is in NAD. HEENT:  Normocephalic, atraumatic.    Neurological examination:  Orientation: The patient is alert and oriented x3. Cranial nerves: There is good facial symmetry with mild facial hypomimia. The speech is fluent and clear. Hearing is intact to conversational tone.  Motor: Strength is at least antigravity x4.  Movement examination: Abnormal movements: No  dyskinesia today. Coordination:  There is mild decremation with RAM's, with any form of RAMS, including alternating supination and pronation of the forearm, hand opening and closing, finger taps, heel taps and toe taps on the right Gait and Station: The patient pushes off to arise.  She she walks with a walker, but she is short stepped.  The right leg drags some.  I have reviewed and interpreted the following labs independently    Chemistry      Component Value Date/Time   NA 142 05/10/2021 1419   NA 143 11/26/2019 1059   K 3.9 05/10/2021 1419   CL 103 05/10/2021 1419   CO2 31 05/10/2021 1419   BUN 18 05/10/2021 1419   BUN 14 11/26/2019 1059   CREATININE 0.79 05/10/2021 1419      Component Value Date/Time   CALCIUM 9.3 05/10/2021 1419   ALKPHOS 71 07/30/2019 0934   AST 14 (L) 07/30/2019 0934   ALT 7 07/30/2019 0934   BILITOT 0.8 07/30/2019 0934  Lab Results  Component Value Date   WBC 7.1 05/10/2021   HGB 13.7 05/10/2021   HCT 42.5 05/10/2021   MCV 83.5 05/10/2021   PLT 111.0 (L) 05/10/2021    Lab Results  Component Value Date   TSH 4.21 10/06/2006   Follow up Instructions      -I discussed the assessment and treatment plan with the patient. The patient was provided an opportunity to ask questions and all were answered. The patient agreed with the plan and demonstrated an understanding of the instructions.   The patient was advised to call back or seek an in-person evaluation if the symptoms worsen or if the condition fails to improve as anticipated.    Total time spent on today's visit was ***minutes, including both face-to-face time and nonface-to-face time.  Time included that spent on review of records (prior notes available to me/labs/imaging if pertinent), discussing treatment and goals, answering patient's questions and coordinating care.   Kerin Salen, DO   Cc:  Mila Palmer, MD

## 2022-01-29 ENCOUNTER — Encounter: Payer: Self-pay | Admitting: Neurology

## 2022-01-29 ENCOUNTER — Telehealth (INDEPENDENT_AMBULATORY_CARE_PROVIDER_SITE_OTHER): Payer: Medicare PPO | Admitting: Neurology

## 2022-01-29 DIAGNOSIS — G20A1 Parkinson's disease without dyskinesia, without mention of fluctuations: Secondary | ICD-10-CM

## 2022-01-29 MED ORDER — AMBULATORY NON FORMULARY MEDICATION: 0 refills | Status: AC

## 2022-02-11 DIAGNOSIS — Z111 Encounter for screening for respiratory tuberculosis: Secondary | ICD-10-CM | POA: Diagnosis not present

## 2022-02-13 DIAGNOSIS — Z0279 Encounter for issue of other medical certificate: Secondary | ICD-10-CM | POA: Diagnosis not present

## 2022-02-20 ENCOUNTER — Encounter: Payer: Self-pay | Admitting: Neurology

## 2022-02-24 ENCOUNTER — Other Ambulatory Visit: Payer: Self-pay | Admitting: Neurology

## 2022-02-24 DIAGNOSIS — G20A1 Parkinson's disease without dyskinesia, without mention of fluctuations: Secondary | ICD-10-CM

## 2022-03-01 DIAGNOSIS — H18593 Other hereditary corneal dystrophies, bilateral: Secondary | ICD-10-CM | POA: Diagnosis not present

## 2022-03-01 DIAGNOSIS — H34232 Retinal artery branch occlusion, left eye: Secondary | ICD-10-CM | POA: Diagnosis not present

## 2022-03-01 DIAGNOSIS — H353131 Nonexudative age-related macular degeneration, bilateral, early dry stage: Secondary | ICD-10-CM | POA: Diagnosis not present

## 2022-03-01 DIAGNOSIS — H25813 Combined forms of age-related cataract, bilateral: Secondary | ICD-10-CM | POA: Diagnosis not present

## 2022-03-12 DIAGNOSIS — Z7901 Long term (current) use of anticoagulants: Secondary | ICD-10-CM | POA: Diagnosis not present

## 2022-03-12 DIAGNOSIS — K219 Gastro-esophageal reflux disease without esophagitis: Secondary | ICD-10-CM | POA: Diagnosis not present

## 2022-03-12 DIAGNOSIS — E785 Hyperlipidemia, unspecified: Secondary | ICD-10-CM | POA: Diagnosis not present

## 2022-03-12 DIAGNOSIS — G4733 Obstructive sleep apnea (adult) (pediatric): Secondary | ICD-10-CM | POA: Diagnosis not present

## 2022-03-12 DIAGNOSIS — I482 Chronic atrial fibrillation, unspecified: Secondary | ICD-10-CM | POA: Diagnosis not present

## 2022-03-12 DIAGNOSIS — I10 Essential (primary) hypertension: Secondary | ICD-10-CM | POA: Diagnosis not present

## 2022-03-12 DIAGNOSIS — E039 Hypothyroidism, unspecified: Secondary | ICD-10-CM | POA: Diagnosis not present

## 2022-03-12 DIAGNOSIS — G20C Parkinsonism, unspecified: Secondary | ICD-10-CM | POA: Diagnosis not present

## 2022-03-14 DIAGNOSIS — R296 Repeated falls: Secondary | ICD-10-CM | POA: Diagnosis not present

## 2022-03-14 DIAGNOSIS — M62512 Muscle wasting and atrophy, not elsewhere classified, left shoulder: Secondary | ICD-10-CM | POA: Diagnosis not present

## 2022-03-14 DIAGNOSIS — R2681 Unsteadiness on feet: Secondary | ICD-10-CM | POA: Diagnosis not present

## 2022-03-14 DIAGNOSIS — R278 Other lack of coordination: Secondary | ICD-10-CM | POA: Diagnosis not present

## 2022-03-14 DIAGNOSIS — M62511 Muscle wasting and atrophy, not elsewhere classified, right shoulder: Secondary | ICD-10-CM | POA: Diagnosis not present

## 2022-03-14 DIAGNOSIS — R300 Dysuria: Secondary | ICD-10-CM | POA: Diagnosis not present

## 2022-03-19 DIAGNOSIS — R296 Repeated falls: Secondary | ICD-10-CM | POA: Diagnosis not present

## 2022-03-19 DIAGNOSIS — M62512 Muscle wasting and atrophy, not elsewhere classified, left shoulder: Secondary | ICD-10-CM | POA: Diagnosis not present

## 2022-03-19 DIAGNOSIS — R278 Other lack of coordination: Secondary | ICD-10-CM | POA: Diagnosis not present

## 2022-03-19 DIAGNOSIS — R2681 Unsteadiness on feet: Secondary | ICD-10-CM | POA: Diagnosis not present

## 2022-03-19 DIAGNOSIS — M62511 Muscle wasting and atrophy, not elsewhere classified, right shoulder: Secondary | ICD-10-CM | POA: Diagnosis not present

## 2022-03-21 DIAGNOSIS — R2681 Unsteadiness on feet: Secondary | ICD-10-CM | POA: Diagnosis not present

## 2022-03-21 DIAGNOSIS — M62511 Muscle wasting and atrophy, not elsewhere classified, right shoulder: Secondary | ICD-10-CM | POA: Diagnosis not present

## 2022-03-21 DIAGNOSIS — M62512 Muscle wasting and atrophy, not elsewhere classified, left shoulder: Secondary | ICD-10-CM | POA: Diagnosis not present

## 2022-03-21 DIAGNOSIS — R296 Repeated falls: Secondary | ICD-10-CM | POA: Diagnosis not present

## 2022-03-21 DIAGNOSIS — R278 Other lack of coordination: Secondary | ICD-10-CM | POA: Diagnosis not present

## 2022-03-25 DIAGNOSIS — I482 Chronic atrial fibrillation, unspecified: Secondary | ICD-10-CM | POA: Diagnosis not present

## 2022-03-25 DIAGNOSIS — E038 Other specified hypothyroidism: Secondary | ICD-10-CM | POA: Diagnosis not present

## 2022-03-26 DIAGNOSIS — R2681 Unsteadiness on feet: Secondary | ICD-10-CM | POA: Diagnosis not present

## 2022-03-26 DIAGNOSIS — M62511 Muscle wasting and atrophy, not elsewhere classified, right shoulder: Secondary | ICD-10-CM | POA: Diagnosis not present

## 2022-03-26 DIAGNOSIS — R278 Other lack of coordination: Secondary | ICD-10-CM | POA: Diagnosis not present

## 2022-03-26 DIAGNOSIS — M62512 Muscle wasting and atrophy, not elsewhere classified, left shoulder: Secondary | ICD-10-CM | POA: Diagnosis not present

## 2022-03-26 DIAGNOSIS — R296 Repeated falls: Secondary | ICD-10-CM | POA: Diagnosis not present

## 2022-03-28 DIAGNOSIS — M62511 Muscle wasting and atrophy, not elsewhere classified, right shoulder: Secondary | ICD-10-CM | POA: Diagnosis not present

## 2022-03-28 DIAGNOSIS — M62512 Muscle wasting and atrophy, not elsewhere classified, left shoulder: Secondary | ICD-10-CM | POA: Diagnosis not present

## 2022-03-28 DIAGNOSIS — R296 Repeated falls: Secondary | ICD-10-CM | POA: Diagnosis not present

## 2022-03-28 DIAGNOSIS — R2681 Unsteadiness on feet: Secondary | ICD-10-CM | POA: Diagnosis not present

## 2022-03-28 DIAGNOSIS — R278 Other lack of coordination: Secondary | ICD-10-CM | POA: Diagnosis not present

## 2022-04-01 DIAGNOSIS — M62511 Muscle wasting and atrophy, not elsewhere classified, right shoulder: Secondary | ICD-10-CM | POA: Diagnosis not present

## 2022-04-01 DIAGNOSIS — R296 Repeated falls: Secondary | ICD-10-CM | POA: Diagnosis not present

## 2022-04-01 DIAGNOSIS — R2681 Unsteadiness on feet: Secondary | ICD-10-CM | POA: Diagnosis not present

## 2022-04-01 DIAGNOSIS — R278 Other lack of coordination: Secondary | ICD-10-CM | POA: Diagnosis not present

## 2022-04-01 DIAGNOSIS — M62512 Muscle wasting and atrophy, not elsewhere classified, left shoulder: Secondary | ICD-10-CM | POA: Diagnosis not present

## 2022-04-03 DIAGNOSIS — Z79899 Other long term (current) drug therapy: Secondary | ICD-10-CM | POA: Diagnosis not present

## 2022-04-04 ENCOUNTER — Encounter (HOSPITAL_COMMUNITY): Payer: Self-pay | Admitting: *Deleted

## 2022-04-04 DIAGNOSIS — R278 Other lack of coordination: Secondary | ICD-10-CM | POA: Diagnosis not present

## 2022-04-04 DIAGNOSIS — I480 Paroxysmal atrial fibrillation: Secondary | ICD-10-CM | POA: Diagnosis not present

## 2022-04-04 DIAGNOSIS — R2681 Unsteadiness on feet: Secondary | ICD-10-CM | POA: Diagnosis not present

## 2022-04-04 DIAGNOSIS — G20B1 Parkinson's disease with dyskinesia, without mention of fluctuations: Secondary | ICD-10-CM | POA: Diagnosis not present

## 2022-04-04 DIAGNOSIS — Z7901 Long term (current) use of anticoagulants: Secondary | ICD-10-CM | POA: Diagnosis not present

## 2022-04-04 DIAGNOSIS — M62511 Muscle wasting and atrophy, not elsewhere classified, right shoulder: Secondary | ICD-10-CM | POA: Diagnosis not present

## 2022-04-04 DIAGNOSIS — R296 Repeated falls: Secondary | ICD-10-CM | POA: Diagnosis not present

## 2022-04-04 DIAGNOSIS — R600 Localized edema: Secondary | ICD-10-CM | POA: Diagnosis not present

## 2022-04-04 DIAGNOSIS — M62512 Muscle wasting and atrophy, not elsewhere classified, left shoulder: Secondary | ICD-10-CM | POA: Diagnosis not present

## 2022-04-04 DIAGNOSIS — E038 Other specified hypothyroidism: Secondary | ICD-10-CM | POA: Diagnosis not present

## 2022-04-09 DIAGNOSIS — R278 Other lack of coordination: Secondary | ICD-10-CM | POA: Diagnosis not present

## 2022-04-09 DIAGNOSIS — M62512 Muscle wasting and atrophy, not elsewhere classified, left shoulder: Secondary | ICD-10-CM | POA: Diagnosis not present

## 2022-04-09 DIAGNOSIS — R296 Repeated falls: Secondary | ICD-10-CM | POA: Diagnosis not present

## 2022-04-09 DIAGNOSIS — R2681 Unsteadiness on feet: Secondary | ICD-10-CM | POA: Diagnosis not present

## 2022-04-09 DIAGNOSIS — M62511 Muscle wasting and atrophy, not elsewhere classified, right shoulder: Secondary | ICD-10-CM | POA: Diagnosis not present

## 2022-04-11 DIAGNOSIS — R278 Other lack of coordination: Secondary | ICD-10-CM | POA: Diagnosis not present

## 2022-04-11 DIAGNOSIS — R296 Repeated falls: Secondary | ICD-10-CM | POA: Diagnosis not present

## 2022-04-11 DIAGNOSIS — R2681 Unsteadiness on feet: Secondary | ICD-10-CM | POA: Diagnosis not present

## 2022-04-11 DIAGNOSIS — M62512 Muscle wasting and atrophy, not elsewhere classified, left shoulder: Secondary | ICD-10-CM | POA: Diagnosis not present

## 2022-04-11 DIAGNOSIS — M62511 Muscle wasting and atrophy, not elsewhere classified, right shoulder: Secondary | ICD-10-CM | POA: Diagnosis not present

## 2022-04-15 NOTE — Progress Notes (Unsigned)
Cardiology Office Note:    Date:  04/16/2022   ID:  Amyracle, Scelsi Aug 16, 1950, MRN [id number]  PCP:  Jonathon Jordan, MD  Cardiologist:  Kirk Ruths, MD  Electrophysiologist:  None   Referring MD: Jonathon Jordan, MD   Chief Complaint: "swelling"  History of Present Illness:    Dinah P Worden is a 72 y.o. female with a history of normal coronaries on cardiac catheterization in 06/2019, severe aortic stenosis s/p TAVR in 07/2019, permanent  atrial fibrillation on Eliquis, chronic diastolic CHF,  mild ascending aorta dilatation, hypertension, hyperlipidemia, hypothyroidism, GERD, Parkinson disease, obstructive sleep apnea, and morbid obesity who is followed by Dr. Stanford Breed and presents today for evaluation of swelling.  Patient was has been followed by Dr. Stanford Breed since 2018 primarily for aortic stenosis and atrial fibrillation. Echo in 04/2019 showed normal LV function with progression of his aortic stenosis to the severe range. He ultimately underwent TAVR in 07/2019. Cardiac catheterization prior to valve surgery showed normal coronaries. During cardiac catheterization, she was noted to be in new onset atrial fibrillation and was started on Eliquis. Patient was last seen by Dr. Stanford Breed in 07/2021 at which time she reported some dyspnea on exertion but was otherwise doing well from a cardiac standpoint. Dyspne was felt to be multifactorial with obesity hypoventilation syndrome, sleep apnea, and deconditioning playing a role. However, Echo was ordered for further evaluation and showed LVEF of 55-60% with normal wall motion, normal RV, normal functioning TAVR with mean gradient of 11 mmHG, and mild dilatation of the ascending aorta measuring 41 mm.  Patient presents today to discuss recent edema. Here with sister. Patient started having bilateral leg swelling about 2-3 weeks ago and bilateral leg pain about 1 week ago. The swelling is worse in the left leg but the pain is worse on the right leg.  She states legs hurt at rest and when walking. She was having so much pain last night that she could not sleep in her bed because she could not lift her leg up that much. She has a history of scoliosis and reports low back pain as well. She also reports feeling "weighed down with fluid" when waking up in the morning and sister reported some chest congestion. She is a "heavy breather" at baseline and it sounds like she is audibly wheezing during visit today but lungs clear on exam. Sister states this is how she normally sounds. She denies any new or worsening shortness of breath. She denies any orthopnea but does report waking up at times feeling short of breath.  She has a history of sleep apnea but was unable to tolerate the CPAP machine. Her weight is actually down about 30 lbs since last visit in 07/2021. She denies any chest pain, palpitations, lightheadedness, dizziness, or syncope. She is on Eliquis for permanent atrial fibrillation and denies missing any doses of this.    Past Medical History:  Diagnosis Date   Atrial fibrillation, persistent ([place])    Dyspnea    with head low, sleep apnea   Dysrhythmia    afib   GERD (gastroesophageal reflux disease)    H/O hiatal hernia    Hematuria    History of kidney stones    HOH (hard of hearing)    sightly, bilaterally   Hyperlipidemia    Hypertension    Hypothyroidism    Parkinson disease    S/P TAVR (transcatheter aortic valve replacement) 08/03/2019   s/p TAVR with 26 mm Edwards S3U via  the TF approach by Drs Buena Irish & Roxy Manns   Severe aortic stenosis    Sleep apnea     Past Surgical History:  Procedure Laterality Date   ABDOMINAL HYSTERECTOMY     lso   CARDIOVERSION N/A 11/30/2019   Procedure: CARDIOVERSION;  Surgeon: Lelon Perla, MD;  Location: [place];  Service: Cardiovascular;  Laterality: N/A;   CYSTOSCOPY W/ RETROGRADES  02/28/2012   Procedure: CYSTOSCOPY WITH RETROGRADE PYELOGRAM;  Surgeon: Alexis Frock, MD;  [facility];  Service: Urology;  Laterality: Right;   CYSTOSCOPY WITH STENT PLACEMENT  02/28/2012   Procedure: CYSTOSCOPY WITH STENT PLACEMENT;  Surgeon: Alexis Frock, MD;  Location: [facility];  Service: Urology;  Laterality: Left;   CYSTOSCOPY/RETROGRADE/URETEROSCOPY/STONE EXTRACTION WITH BASKET  02/28/2012   Procedure: CYSTOSCOPY/RETROGRADE/URETEROSCOPY/STONE EXTRACTION WITH BASKET;  Surgeon: Alexis Frock, MD;  Location: [facility];  Service: Urology;  Laterality: Left;   HOLMIUM LASER APPLICATION  [id number]   Procedure: HOLMIUM LASER APPLICATION;  Surgeon: Alexis Frock, MD;  Location: [facility];  Service: Urology;  Laterality: Left;   INTRAOPERATIVE TRANSTHORACIC ECHOCARDIOGRAM  08/03/2019   Procedure: Intraoperative Transthoracic Echocardiogram;  Surgeon: Burnell Blanks, MD;  Location: [place];  Service: Open Heart Surgery;;   KNEE ARTHROSCOPY     right   REVERSE SHOULDER ARTHROPLASTY Left 07/22/2018   Procedure: REVERSE SHOULDER ARTHROPLASTY;  Surgeon: Hiram Gash, MD;  Location: [facility];  Service: Orthopedics;  Laterality: Left;   RIGHT/LEFT HEART CATH AND CORONARY ANGIOGRAPHY N/A 07/05/2019   Procedure: RIGHT/LEFT HEART CATH AND CORONARY ANGIOGRAPHY;  Surgeon: Burnell Blanks, MD;  Location: [facility];  Service: Cardiovascular;  Laterality: N/A;   TONSILLECTOMY     TRANSCATHETER AORTIC VALVE REPLACEMENT, TRANSFEMORAL N/A 08/03/2019   Procedure: TRANSCATHETER AORTIC VALVE REPLACEMENT, TRANSFEMORAL;  Surgeon: Burnell Blanks, MD;  Location: [place];  Service: Open Heart Surgery;  Laterality: N/A;   VEIN LIGATION      Current Medications: Current Meds  Medication Sig   AMBULATORY NON FORMULARY MEDICATION Lift chair Dx:  G20.A1   AMBULATORY NON FORMULARY MEDICATION Wheelchair Dx: G20.A1   apixaban (ELIQUIS) 5 MG TABS tablet TAKE 1 TABLET BY MOUTH TWICE A DAY   bisoprolol-hydrochlorothiazide  (ZIAC) 5-6.25 MG tablet Take 1 tablet by mouth daily.   carbidopa-levodopa (SINEMET IR) 25-100 MG tablet 2 AT 8AM, 2 AT NOON, 1 AT 4PM, AND 1 AT 8PM MAY TAKE EXTRA AS NEEDED   cephALEXin (KEFLEX) 500 MG capsule Take 2,000 mg (4 capsules) one hour to all dental visits.   cholecalciferol (VITAMIN D) 25 MCG (1000 UNIT) tablet Take 1,000 Units by mouth daily.   Collagen-Boron-Hyaluronic Acid ([employer] JOINT HEALTH TRIPLE ACTION PO) Take 1 tablet by mouth daily. Triple Action Joint Health   Cyanocobalamin (VITAMIN B-12) 5000 MCG TBDP Take 5,000 mcg by mouth daily.   docusate sodium (COLACE) 100 MG capsule Take 200 mg by mouth daily as needed (constipation.).   esomeprazole (NEXIUM) 20 MG capsule Take 20 mg by mouth daily.    levothyroxine (SYNTHROID, LEVOTHROID) 75 MCG tablet Take 75 mcg by mouth daily before breakfast.    Menthol-Methyl Salicylate (SALONPAS PAIN RELIEF PATCH) PTCH Place 1 patch onto the skin daily as needed (pain.).   Multiple Vitamins-Minerals (PRESERVISION AREDS PO) Take 1 tablet by mouth daily.   Omega-3 Fatty Acids (FISH OIL) 1200 MG CAPS Take 1,200 mg by mouth daily.   Polyethyl Glycol-Propyl Glycol (SYSTANE OP) Place 1 drop into both  eyes in the morning and at bedtime.   [DISCONTINUED] furosemide (LASIX) 40 MG tablet Take 1 tablet (40 mg total) by mouth daily.   [DISCONTINUED] potassium chloride SA (KLOR-CON M) 20 MEQ tablet Take 1 tablet (20 mEq total) by mouth daily.     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  Tobacco Use   Smoking status: Never   Smokeless tobacco: Never  Vaping Use   Vaping Use: Never used  Substance and Sexual Activity   Alcohol use:  Never   Drug use: Never   Sexual activity: Not on file  Other Topics Concern   Not on file  Social History Narrative   Right handed   Lives alone   One story home one step up to home   Caffeine 2 cups   Social Determinants of Health   Financial Resource Strain: Low Risk  (10/18/2021)   Overall Financial Resource Strain (CARDIA)    Difficulty of Paying Living Expenses: Not hard at all  Food Insecurity: No Food Insecurity (10/18/2021)   Hunger Vital Sign    Worried About Running Out of Food in the Last Year: Never true    [place] in the Last Year: Never true  Transportation Needs: No Transportation Needs (10/18/2021)   PRAPARE - Hydrologist (Medical): No    Lack of Transportation (Non-Medical): No  Physical Activity: Not on file  Stress: Not on file  Social Connections: Not on file     Family History: The patient's family history includes Healthy in her sister and son; Heart failure in her father and mother; Pneumonia in her father.  ROS:   Please see the history of present illness.     EKGs/Labs/Other Studies Reviewed:    The following studies were reviewed:  Right/ Left Cardiac Catheterization 07/05/2019: 1. No angiographic evidence of CAD 2. Severe aortic stenosis. Cath data: mean gradient 28.2mHg, peak to peak gradient 54 mmHg, AVA 0.9 cm2. Echo data with AVA 0.4 cm2 with DI less than .20.  Unable to obtain wedge pressure.    Recommendations: Will continue workup for TAVR. Of note, she is in atrial fibrillation today. Reviewed with Dr. CStanford Breedas this is new for her. Will start Eliquis tomorrow.   Diagnostic Dominance: Right   _______________  Echocardiogram 08/29/2021: Impressions: 1. Left ventricular ejection fraction, by estimation, is 55 to 60%. The  left ventricle has normal function. The left ventricle has no regional  wall motion abnormalities. There is mild concentric left ventricular  hypertrophy. Left ventricular  diastolic  parameters are indeterminate.   2. Right ventricular systolic function is normal. The right ventricular  size is normal. There is normal pulmonary artery systolic pressure. The  estimated right ventricular systolic pressure is 299991111mmHg.   3. Left atrial size was mildly dilated.   4. Right atrial size was mildly dilated.   5. The mitral valve is normal in structure. No evidence of mitral valve  regurgitation. No evidence of mitral stenosis.   6. S/p TAVR with 26 mm Edwards S[place] THV. EOA 1.75 cm^2, mean gradient  11 mmHg, DI 0.4. No significant perivalvular regurgitation.  Normal  function of valve.   7. Aortic dilatation noted. There is mild dilatation of the ascending  aorta, measuring 41 mm.   8. The inferior vena cava is normal in size with greater than 50%  respiratory variability, suggesting right atrial pressure of 3 mmHg.     EKG:  EKG not ordered today.   Recent Labs: 05/10/2021: BUN 18; Creatinine, Ser 0.79; Hemoglobin 13.7; Platelets 111.0; Potassium 3.9; Sodium 142  Recent Lipid Panel No results found for: "CHOL", "TRIG", "HDL", "CHOLHDL", "VLDL", "LDLCALC", "LDLDIRECT"  Physical Exam:    Vital Signs: BP 116/74   Pulse 82   Ht 5' 3"$  (1.6 m)   Wt 250 lb (113.4 kg)   SpO2 97%   BMI 44.29 kg/m     Wt Readings from Last 3 Encounters:  04/16/22 250 lb (113.4 kg)  07/31/21 281 lb (127.5 kg)  07/16/21 270 lb (122.5 kg)     General: 72 y.o. morbidly obese Caucasian  female in no acute distress. HEENT: Normocephalic and atraumatic. Sclera clear.  Neck: Supple. No JVD. Heart: Irregularly irregular rhythm with normal rate. Distinct S1 and S2. No murmurs, gallops, or rubs. Radial and distal pedal pulses 2+ and equal bilaterally. Lungs: No increased work of breathing. Clear to auscultation bilaterally. No wheezes, rhonchi, or rales.  Abdomen: Soft, non-distended, and non-tender to palpation.  Extremities: 2-3+ pitting edema of bilateral lower extremities  extending up to thighs.   Skin: Warm and dry. Neuro: Alert and oriented x3. No focal deficits. Psych: Normal affect. Responds appropriately.  Assessment:    1. Bilateral lower extremity edema   2. Bilateral leg pain   3. Chronic diastolic CHF (congestive heart failure) ([place])   4. Permanent atrial fibrillation ([place])   5. Severe aortic stenosis   6. S/P TAVR (transcatheter aortic valve replacement)   7. Mild ascending aorta dilatation ([facility])   8. Primary hypertension   9. Hyperlipidemia, unspecified hyperlipidemia type   10. Obstructive sleep apnea     Plan:    Lower Extremity Edema Bilateral Leg Pain Patient reports bilateral lower extremity edema (left > right) for the past 2-3 weeks and bilateral leg pain (right > left) for the past week. Please see HPI for more details. - She does have significant edema on exam.  - Will recheck BNP and BMET. - Will go ahead and start Lasix 64m daily and KCl 20 mEq daily. Will then need to repeat BMET in 1 week.  - She has good distal pedal pulses and lower extremities are warm. Do not think pain is coming from PAD. Suspect leg pain may be from significant edema and possible sciatica (she has a history of scoliosis and low back pain). Will see how pain improves with Lasix. Otherwise, recommended following up with PCP/ Ortho. If no improvement in pain with diuresis, can consider lower extremity arterial ultrasounds to screen for PAD. - She does have some asymmetry in her edema. However, she has not missed any doses of her Eliquis. She is very sedentary but given compliance with Eliquis, I think the chance of a DVT is low. Did offer lower extremity dopplers to rule out DVT but after shared-decision making with patient and family, decision was made to hold off on this for now.  Chronic Diastolic CHF Recent Echo in 08/2021 showed LVEF of 55-60% with indeterminate diastolic parameters.  - She has significant lower extremity edema but lungs clear. JVD  difficult to assess due to body habitus. Weight actually down 30 lbs from  last office visit in 07/2021. - Will check BNP and and BMET as above. - Will start Lasix 107m daily (and KCl 223m daily) as above.   Permanent Atrial Fibrillation Initially diagnosed during cardiac catheterization in 06/2019. Plan has been for rate control. - Rate controlled during today's visit. - Continue Bisoprolol 42m42maily (as part of the combo Bisoprolol-HCTZ medication). - Continue chronic anticoagulation with Eliquis 42mg39mice daily.   Severe Aortic Stenosis s/p TAVR S/p TAVR in 07/2019. Last Echo in 08/2021 showed normal functioning TAVR with mean gradient of 11 mmHg. - Continue SBE prophylaxis prior to dental procedures.   Mild Ascending Aorta Dilatation Echo in 08/2021 showed mild dilatation of the ascending aorta measuring 41 mm. - Can monitor on repeat Echo in 08/2022.  Hypertension BP well controlled.  - Continue Bisoprolol-HCTZ 5-6.242mg daily.   Hyperlipidemia History of hyperlipidemia in his chart but not on any statins. Most recent lipid panel in 08/2021: Total Cholesterol 09/03/2021, Triglycerides 88, HDL 54, LDL 117.  - Did not have time to discuss this today given acute concerns above.  Obstructive Sleep Apnea Unable to tolerate CPAP machine in the past.    Disposition: Follow up with me in about 2 weeks.    Medication Adjustments/Labs and Tests Ordered: Current medicines are reviewed at length with the patient today.  Concerns regarding medicines are outlined above.  Orders Placed This Encounter  Procedures   Basic metabolic panel   Brain natriuretic peptide   Meds ordered this encounter  Medications   DISCONTD: furosemide (LASIX) 40 MG tablet    Sig: Take 1 tablet (40 mg total) by mouth daily.    Dispense:  30 tablet    Refill:  0   DISCONTD: potassium chloride SA (KLOR-CON M) 20 MEQ tablet    Sig: Take 1 tablet (20 mEq total) by mouth daily.    Dispense:  30 tablet    Refill:  0    furosemide (LASIX) 40 MG tablet    Sig: Take 1 tablet (40 mg total) by mouth daily.    Dispense:  30 tablet    Refill:  0   potassium chloride SA (KLOR-CON M) 20 MEQ tablet    Sig: Take 1 tablet (20 mEq total) by mouth daily.    Dispense:  30 tablet    Refill:  0    Patient Instructions  Medication Instructions:  Lasix 40mg42mly Potassium Chloride 20meq14mly *If you need a refill on your cardiac medications before your next appointment, please call your pharmacy*   Lab Work: BMET and BNP If you have labs (blood work) drawn today and your tests are completely normal, you will receive your results only by: [facility] have MyChart) OR A paper copy in the mail If you have any lab test that is abnormal or we need to change your treatment, we will call you to review the results.  Follow-Up: At [facility] your health needs are our priority.  As part of our continuing mission to provide you with exceptional heart care, we have created designated Provider Care Teams.  These Care Teams include your primary Cardiologist (physician) and Advanced Practice Providers (APPs -  Physician Assistants and Nurse Practitioners) who all work together to provide you with the care you need, when you need it.  We recommend signing up for the patient portal called "MyChart".  Sign up information is provided on this After Visit Summary.  MyChart is used to connect with patients  for Virtual Visits (Telemedicine).  Patients are able to view lab/test results, encounter notes, upcoming appointments, etc.  Non-urgent messages can be sent to your provider as well.   To learn more about what you can do with MyChart, go to [email or web address].    Your next appointment:   05/01/22 AT 1:55PM  Provider:   Sande Rives, PA-C    Signed, Darreld Mclean, PA-C  04/16/2022 10:08 PM    [place]

## 2022-04-16 ENCOUNTER — Ambulatory Visit: Payer: Medicare PPO | Attending: Student | Admitting: Student

## 2022-04-16 ENCOUNTER — Encounter: Payer: Self-pay | Admitting: Student

## 2022-04-16 VITALS — BP 116/74 | HR 82 | Ht 63.0 in | Wt 250.0 lb

## 2022-04-16 DIAGNOSIS — M79604 Pain in right leg: Secondary | ICD-10-CM

## 2022-04-16 DIAGNOSIS — I350 Nonrheumatic aortic (valve) stenosis: Secondary | ICD-10-CM

## 2022-04-16 DIAGNOSIS — G4733 Obstructive sleep apnea (adult) (pediatric): Secondary | ICD-10-CM

## 2022-04-16 DIAGNOSIS — I5032 Chronic diastolic (congestive) heart failure: Secondary | ICD-10-CM

## 2022-04-16 DIAGNOSIS — Z952 Presence of prosthetic heart valve: Secondary | ICD-10-CM

## 2022-04-16 DIAGNOSIS — R609 Edema, unspecified: Secondary | ICD-10-CM | POA: Diagnosis not present

## 2022-04-16 DIAGNOSIS — I77810 Thoracic aortic ectasia: Secondary | ICD-10-CM | POA: Diagnosis not present

## 2022-04-16 DIAGNOSIS — I4821 Permanent atrial fibrillation: Secondary | ICD-10-CM | POA: Diagnosis not present

## 2022-04-16 DIAGNOSIS — I10 Essential (primary) hypertension: Secondary | ICD-10-CM

## 2022-04-16 DIAGNOSIS — R278 Other lack of coordination: Secondary | ICD-10-CM | POA: Diagnosis not present

## 2022-04-16 DIAGNOSIS — M62512 Muscle wasting and atrophy, not elsewhere classified, left shoulder: Secondary | ICD-10-CM | POA: Diagnosis not present

## 2022-04-16 DIAGNOSIS — R600 Localized edema: Secondary | ICD-10-CM

## 2022-04-16 DIAGNOSIS — M79605 Pain in left leg: Secondary | ICD-10-CM

## 2022-04-16 DIAGNOSIS — M62511 Muscle wasting and atrophy, not elsewhere classified, right shoulder: Secondary | ICD-10-CM | POA: Diagnosis not present

## 2022-04-16 DIAGNOSIS — E785 Hyperlipidemia, unspecified: Secondary | ICD-10-CM | POA: Diagnosis not present

## 2022-04-16 DIAGNOSIS — R296 Repeated falls: Secondary | ICD-10-CM | POA: Diagnosis not present

## 2022-04-16 DIAGNOSIS — R2681 Unsteadiness on feet: Secondary | ICD-10-CM | POA: Diagnosis not present

## 2022-04-16 MED ORDER — FUROSEMIDE 40 MG PO TABS: 40.0000 mg | ORAL_TABLET | Freq: Every day | ORAL | 0 refills | Status: DC

## 2022-04-16 MED ORDER — POTASSIUM CHLORIDE CRYS ER 20 MEQ PO TBCR: 20.0000 meq | EXTENDED_RELEASE_TABLET | Freq: Every day | ORAL | 0 refills | Status: DC

## 2022-04-16 NOTE — Patient Instructions (Signed)
Medication Instructions:  Lasix 61m daily Potassium Chloride 244m daily *If you need a refill on your cardiac medications before your next appointment, please call your pharmacy*   Lab Work: BMET and BNP If you have labs (blood work) drawn today and your tests are completely normal, you will receive your results only by: [facility] you have MyChart) OR A paper copy in the mail If you have any lab test that is abnormal or we need to change your treatment, we will call you to review the results.  Follow-Up: [facility] and your health needs are our priority.  As part of our continuing mission to provide you with exceptional heart care, we have created designated Provider Care Teams.  These Care Teams include your primary Cardiologist (physician) and Advanced Practice Providers (APPs -  Physician Assistants and Nurse Practitioners) who all work together to provide you with the care you need, when you need it.  We recommend signing up for the patient portal called "MyChart".  Sign up information is provided on this After Visit Summary.  MyChart is used to connect with patients for Virtual Visits (Telemedicine).  Patients are able to view lab/test results, encounter notes, upcoming appointments, etc.  Non-urgent messages can be sent to your provider as well.   To learn more about what you can do with MyChart, go to [email or web address]   Your next appointment:   05/01/22 AT 1:55PM  Provider:   CaSande RivesPA-C

## 2022-04-17 ENCOUNTER — Other Ambulatory Visit: Payer: Self-pay

## 2022-04-17 ENCOUNTER — Telehealth: Payer: Self-pay | Admitting: Cardiology

## 2022-04-17 DIAGNOSIS — I4819 Other persistent atrial fibrillation: Secondary | ICD-10-CM

## 2022-04-17 LAB — BASIC METABOLIC PANEL
BUN/Creatinine Ratio: 21 (ref 12–28)
BUN: 18 mg/dL (ref 8–27)
CO2: 25 mmol/L (ref 20–29)
Calcium: 9.1 mg/dL (ref 8.7–10.3)
Chloride: 102 mmol/L (ref 96–106)
Creatinine, Ser: 0.84 mg/dL (ref 0.57–1.00)
Glucose: 84 mg/dL (ref 70–99)
Potassium: 4 mmol/L (ref 3.5–5.2)
Sodium: 145 mmol/L — ABNORMAL HIGH (ref 134–144)
eGFR: 73 mL/min/{1.73_m2} (ref >59)

## 2022-04-17 LAB — BRAIN NATRIURETIC PEPTIDE: BNP: 79.7 pg/mL (ref 0.0–100.0)

## 2022-04-17 MED ORDER — APIXABAN 5 MG PO TABS: 5.0000 mg | ORAL_TABLET | Freq: Two times a day (BID) | ORAL | 5 refills | Status: AC

## 2022-04-17 MED ORDER — FUROSEMIDE 40 MG PO TABS: 40.0000 mg | ORAL_TABLET | Freq: Every day | ORAL | 0 refills | Status: DC

## 2022-04-17 MED ORDER — POTASSIUM CHLORIDE CRYS ER 20 MEQ PO TBCR: 20.0000 meq | EXTENDED_RELEASE_TABLET | Freq: Every day | ORAL | 0 refills | Status: DC

## 2022-04-17 NOTE — Telephone Encounter (Signed)
Caller reported patient's prescriptions should go to [place], [place] [facility] [place] [address].  Caller noted patient has moved to [place].

## 2022-04-17 NOTE — Telephone Encounter (Signed)
Updated RX in the chart.

## 2022-04-18 DIAGNOSIS — M62511 Muscle wasting and atrophy, not elsewhere classified, right shoulder: Secondary | ICD-10-CM | POA: Diagnosis not present

## 2022-04-18 DIAGNOSIS — R296 Repeated falls: Secondary | ICD-10-CM | POA: Diagnosis not present

## 2022-04-18 DIAGNOSIS — M62512 Muscle wasting and atrophy, not elsewhere classified, left shoulder: Secondary | ICD-10-CM | POA: Diagnosis not present

## 2022-04-18 DIAGNOSIS — R2681 Unsteadiness on feet: Secondary | ICD-10-CM | POA: Diagnosis not present

## 2022-04-18 DIAGNOSIS — R278 Other lack of coordination: Secondary | ICD-10-CM | POA: Diagnosis not present

## 2022-04-19 DIAGNOSIS — I110 Hypertensive heart disease with heart failure: Secondary | ICD-10-CM | POA: Diagnosis not present

## 2022-04-19 DIAGNOSIS — E038 Other specified hypothyroidism: Secondary | ICD-10-CM | POA: Diagnosis not present

## 2022-04-20 NOTE — Progress Notes (Signed)
Cardiology Office Note:    Date:  05/01/2022   ID:  Cindy Richmond, Cindy Richmond December 15, 1950, MRN [id number]  PCP:  Mila Palmer, MD  Cardiologist:  [name], MD  Electrophysiologist:  None   Referring MD: Mila Palmer, MD   Chief Complaint: follow-up of lower extremity edema/ pain  History of Present Illness:    Cindy Richmond is a 72 y.o. female with a history of normal coronaries on cardiac catheterization in 06/2019, severe aortic stenosis s/p TAVR in 07/2019, permanent atrial fibrillation on Eliquis, chronic diastolic CHF,  mild ascending aorta dilatation, hypertension, hyperlipidemia, hypothyroidism, GERD, Parkinson disease, obstructive sleep apnea, and morbid obesity who is followed by Dr. Jens Som and presents today for follow-up of lower extremity edema.   Patient has been followed by Dr. Jens Som since 2018 primarily for aortic stenosis and atrial fibrillation. Echo in 04/2019 showed normal LV function with progression of his aortic stenosis to the severe range. He ultimately underwent TAVR in 07/2019. Cardiac catheterization prior to valve surgery showed normal coronaries. During cardiac catheterization, she was noted to be in new onset atrial fibrillation and was started on Eliquis. At visit with Dr. Jens Som in 07/2021, she reported some dyspnea on exertion but was otherwise doing well from a cardiac standpoint. Dyspne was felt to be multifactorial with obesity hypoventilation syndrome, sleep apnea, and deconditioning playing a role. However, Echo was ordered for further evaluation and showed LVEF of 55-60% with normal wall motion, normal RV, normal functioning TAVR with mean gradient of 11 mmHG, and mild dilatation of the ascending aorta measuring 41 mm.  Patient was last seen by me on 04/16/2022 at which time she reported worsening lower extremity edema and pain. Edema was worse in the left leg but pain was worse in the right leg. She denied any new or worsening shortness of breath and  any orthopnea but did report to waking up at night feeling short of breath. She has sleep apnea but reported she was unable to tolerate her CPAP machine. Weight was actually down. She reported compliance with Eliquis so DVT felt to be unlikely.  BNP was normal. She was started on Lasix 40mg  daily (and Kcl 20 mEq daily with this) for 7 days. Leg pain was felt to be possibly be from significant edema and possibly sciatica given history of scoliosis and low back pain.  Patient presents today for follow-up. Here with daughter. She was seen in the ED a couple of days ago on 04/28/2022 for worsening lower extremity edema as well as significant pain in right leg and hip. She was still having trouble lifting her legs up to be able to get them in bed. She weighed 312 lbs lbs. She weighed 250 lbs at last appointment but daughter tells be today that this was a self reported weight and that she was not actually weighed on the the scales. She cannot tell me the last time she actually remembers weighing 250s lbs. She weighed 281 lbs at last office visit in 07/2021. BNP was mildly elevated at 103.5. Chest x-ray showed no acute findings. X-ray of right hip showed no acute findings. She was given a dose of IV Lasix 40mg  in the ED as well as pain medications with improvement. She had at least 900 cc or urinary output with this and felt better. She was discharged home with instructions to follow-up with Cardiology. Today, she states edema and pain have improved some after receiving the IV Lasix. She is no longer having the significant  pain and is now able to walk some. She still has significant edema but improved some with IV Lasix. Weight is down 6lbs today. She always sounds like she is breathing heavily but denies any real shortness of breath. She has been sleeping in a recliner but this is because she has a hard time lifting her feet on the bed rather than because of orthopnea. No chest pain. She report one syncopal episode 2  nights ago that occurred after she stood up from using the bathroom. She states she only LOC for a couple of seconds. She came to on the floor. She thinks she may of grazed her head on the door on the way down but denies hitting her head hard. She denies any neurologic symptoms. She does not remember having any prodromal symptoms prior to this - no palpitations, lightheadedness, dizziness, nausea, or sweating. She states she has had a couple of other syncopal episodes in the past  that also occurred when using the bathroom but none recently (occurred several years ago).   Past Medical History:  Diagnosis Date   Atrial fibrillation, persistent (HCC)    Dyspnea    with head low, sleep apnea   Dysrhythmia    afib   GERD (gastroesophageal reflux disease)    H/O hiatal hernia    Hematuria    History of kidney stones    HOH (hard of hearing)    sightly, bilaterally   Hyperlipidemia    Hypertension    Hypothyroidism    Parkinson disease    S/P TAVR (transcatheter aortic valve replacement) 08/03/2019   s/p TAVR with 26 mm Edwards S3U via the TF approach by Drs Aundra Dubin & Cornelius Moras   Severe aortic stenosis    Sleep apnea     Past Surgical History:  Procedure Laterality Date   ABDOMINAL HYSTERECTOMY     lso   CARDIOVERSION N/A 11/30/2019   Procedure: CARDIOVERSION;  Surgeon: Lewayne Bunting, MD;  Location: [facility] ENDOSCOPY;  Service: Cardiovascular;  Laterality: N/A;   CYSTOSCOPY W/ RETROGRADES  02/28/2012   Procedure: CYSTOSCOPY WITH RETROGRADE PYELOGRAM;  Surgeon: Sebastian Ache, MD;  Location: [facility];  Service: Urology;  Laterality: Right;   CYSTOSCOPY WITH STENT PLACEMENT  02/28/2012   Procedure: CYSTOSCOPY WITH STENT PLACEMENT;  Surgeon: Sebastian Ache, MD;  Location: [facility];  Service: Urology;  Laterality: Left;   CYSTOSCOPY/RETROGRADE/URETEROSCOPY/STONE EXTRACTION WITH BASKET  02/28/2012   Procedure: CYSTOSCOPY/RETROGRADE/URETEROSCOPY/STONE EXTRACTION WITH  BASKET;  Surgeon: Sebastian Ache, MD;  Location: [facility];  Service: Urology;  Laterality: Left;   HOLMIUM LASER APPLICATION  02/28/2012   Procedure: HOLMIUM LASER APPLICATION;  Surgeon: Sebastian Ache, MD;  Location: [facility];  Service: Urology;  Laterality: Left;   INTRAOPERATIVE TRANSTHORACIC ECHOCARDIOGRAM  08/03/2019   Procedure: Intraoperative Transthoracic Echocardiogram;  Surgeon: Kathleene Hazel, MD;  Location[facility] OR;  Service: Open Heart Surgery;;   KNEE ARTHROSCOPY     right   REVERSE SHOULDER ARTHROPLASTY Left 07/22/2018   Procedure: REVERSE SHOULDER ARTHROPLASTY;  Surgeon: Bjorn Pippin, MD;  Location: [facility] ORS;  Service: Orthopedics;  Laterality: Left;   RIGHT/LEFT HEART CATH AND CORONARY ANGIOGRAPHY N/A 07/05/2019   Procedure: RIGHT/LEFT HEART CATH AND CORONARY ANGIOGRAPHY;  Surgeon: Kathleene Hazel, MD;  Location: [facility] INVASIVE CV LAB;  Service: Cardiovascular;  Laterality: N/A;   TONSILLECTOMY     TRANSCATHETER AORTIC VALVE REPLACEMENT, TRANSFEMORAL N/A 08/03/2019   Procedure: TRANSCATHETER AORTIC VALVE REPLACEMENT, TRANSFEMORAL;  Surgeon: Verne Carrow  D, MD;  Location: [facility] OR;  Service: Open Heart Surgery;  Laterality: N/A;   VEIN LIGATION      Current Medications: Current Meds  Medication Sig   AMBULATORY NON FORMULARY MEDICATION Lift chair Dx:  G20.A1   AMBULATORY NON FORMULARY MEDICATION Wheelchair Dx: G20.A1   apixaban (ELIQUIS) 5 MG TABS tablet Take 1 tablet (5 mg total) by mouth 2 (two) times daily.   bisoprolol-hydrochlorothiazide (ZIAC) 5-6.25 MG tablet Take 1 tablet by mouth daily.   carbidopa-levodopa (SINEMET IR) 25-100 MG tablet 2 AT 8AM, 2 AT NOON, 1 AT 4PM, AND 1 AT 8PM MAY TAKE EXTRA AS NEEDED   cephALEXin (KEFLEX) 500 MG capsule Take 2,000 mg (4 capsules) one hour to all dental visits.   cholecalciferol (VITAMIN D) 25 MCG (1000 UNIT) tablet Take 1,000 Units by mouth daily.   Collagen-Boron-Hyaluronic Acid  ([employer] JOINT HEALTH TRIPLE ACTION PO) Take 1 tablet by mouth daily. Triple Action Joint Health   Cyanocobalamin (VITAMIN B-12) 5000 MCG TBDP Take 5,000 mcg by mouth daily.   docusate sodium (COLACE) 100 MG capsule Take 200 mg by mouth daily as needed (constipation.).   esomeprazole (NEXIUM) 20 MG capsule Take 20 mg by mouth daily.    levothyroxine (SYNTHROID, LEVOTHROID) 75 MCG tablet Take 75 mcg by mouth daily before breakfast.    Menthol-Methyl Salicylate (SALONPAS PAIN RELIEF PATCH) PTCH Place 1 patch onto the skin daily as needed (pain.).   Multiple Vitamins-Minerals (PRESERVISION AREDS PO) Take 1 tablet by mouth daily.   Omega-3 Fatty Acids (FISH OIL) 1200 MG CAPS Take 1,200 mg by mouth daily.   oxyCODONE-acetaminophen (PERCOCET/ROXICET) 5-325 MG tablet Take 0.5-1 tablets by mouth every 6 (six) hours as needed for severe pain.   Polyethyl Glycol-Propyl Glycol (SYSTANE OP) Place 1 drop into both eyes in the morning and at bedtime.   [DISCONTINUED] furosemide (LASIX) 40 MG tablet Take 1 tablet (40 mg total) by mouth daily.   [DISCONTINUED] potassium chloride SA (KLOR-CON M) 20 MEQ tablet Take 1 tablet (20 mEq total) by mouth daily.     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  Tobacco Use   Smoking status: Never   Smokeless tobacco: Never  Vaping Use   Vaping Use: Never used  Substance and Sexual Activity   Alcohol use: Never   Drug use: Never   Sexual activity: Not on file  Other Topics Concern   Not on file  Social History Narrative   Right handed   Lives alone   One story home one step up to home   Caffeine 2 cups   Social Determinants of Health   Financial  Resource Strain: Low Risk  (10/18/2021)   Overall Financial Resource Strain (CARDIA)    Difficulty of Paying Living Expenses: Not hard at all  Food Insecurity: No Food Insecurity (10/18/2021)   Hunger Vital Sign    Worried About Running Out of Food in the Last Year: Never true    Ran Out of Food in the Last Year: Never true  Transportation Needs: No Transportation Needs (10/18/2021)   PRAPARE - Transportation    Lack of Transportation (Medical): No    Lack  of Transportation (Non-Medical): No  Physical Activity: Not on file  Stress: Not on file  Social Connections: Not on file     Family History: The patient's family history includes Healthy in her sister and son; Heart failure in her father and mother; Pneumonia in her father.  ROS:   Please see the history of present illness.     EKGs/Labs/Other Studies Reviewed:    The following studies were reviewed:  Right/ Left Cardiac Catheterization 07/05/2019: 1. No angiographic evidence of CAD 2. Severe aortic stenosis. Cath data: mean gradient 28.46mmHg, peak to peak gradient 54 mmHg, AVA 0.9 cm2. Echo data with AVA 0.4 cm2 with DI less than .20.  Unable to obtain wedge pressure.    Recommendations: Will continue workup for TAVR. Of note, she is in atrial fibrillation today. Reviewed with Dr. Jens Som as this is new for her. Will start Eliquis tomorrow.    Diagnostic Dominance: Right   _______________   Echocardiogram 08/29/2021: Impressions: 1. Left ventricular ejection fraction, by estimation, is 55 to 60%. The  left ventricle has normal function. The left ventricle has no regional  wall motion abnormalities. There is mild concentric left ventricular  hypertrophy. Left ventricular diastolic  parameters are indeterminate.   2. Right ventricular systolic function is normal. The right ventricular  size is normal. There is normal pulmonary artery systolic pressure. The  estimated right ventricular systolic pressure is 26.4 mmHg.   3.  Left atrial size was mildly dilated.   4. Right atrial size was mildly dilated.   5. The mitral valve is normal in structure. No evidence of mitral valve  regurgitation. No evidence of mitral stenosis.   6. S/p TAVR with 26 mm Edwards Sapien THV. EOA 1.75 cm^2, mean gradient  11 mmHg, DI 0.4. No significant perivalvular regurgitation. Normal  function of valve.   7. Aortic dilatation noted. There is mild dilatation of the ascending  aorta, measuring 41 mm.   8. The inferior vena cava is normal in size with greater than 50%  respiratory variability, suggesting right atrial pressure of 3 mmHg.   EKG:  EKG not ordered today.   Recent Labs: 04/28/2022: ALT 7; B Natriuretic Peptide 103.5; BUN 23; Creatinine, Ser 0.94; Hemoglobin 12.3; Platelets 125; Potassium 4.5; Sodium 138  Recent Lipid Panel No results found for: "CHOL", "TRIG", "HDL", "CHOLHDL", "VLDL", "LDLCALC", "LDLDIRECT"  Physical Exam:    Vital Signs: BP 125/67 (BP Location: Left Arm, Patient Position: Sitting, Cuff Size: Large)   Pulse 95   Ht 5\' 4"  (1.626 m)   Wt (!) 306 lb 12.8 oz (139.2 kg)   SpO2 96%   BMI 52.66 kg/m     Wt Readings from Last 3 Encounters:  05/01/22 (!) 306 lb 12.8 oz (139.2 kg)  04/28/22 (!) 312 lb 12.8 oz (141.9 kg)  04/16/22 250 lb (113.4 kg)     General: 72 y.o. obese Caucasian female in no acute distress. HEENT: Normocephalic and atraumatic. Sclera clear. Neck: Supple. No carotid bruits. No JVD. Heart: Irregularly irregular rhythm with normal rate.  Distinct S1 and S2. No murmurs, gallops, or rubs.  Lungs: No increased work of breathing. Clear to ausculation bilaterally. No wheezes, rhonchi, or rales.  Abdomen: Soft, non-distended, and non-tender to palpation.  Extremities: 3+ pitting edema of bilateral lower extremity edema.  Skin: Warm and dry. Neuro: Alert and oriented x3. No focal deficits. Psych: Normal affect. Responds appropriately.   Assessment:    1. Lower extremity edema   2.  Bilateral leg  pain   3. Acute on chronic diastolic CHF (congestive heart failure) (HCC)   4. Syncope and collapse   5. Permanent atrial fibrillation (HCC)   6. Severe aortic stenosis   7. S/P TAVR (transcatheter aortic valve replacement)   8. Ascending aorta dilatation (HCC)   9. Primary hypertension   10. Hyperlipidemia, unspecified hyperlipidemia type   11. Obstructive sleep apnea   12. Medication management     Plan:    Lower Extremity Edema Bilateral Leg Pain Patient was seen in the office on 04/16/2022 and reports bilateral lower extremity edema (left > right)  and bilateral leg pain (right > left). BNP was normal. She reported compliance with Eliquis so DVT was felt to be unlikely. She was started on Lasix for 1 week to see if that helped. Leg pain was felt to be possibly be from significant edema and possibly sciatica given history of scoliosis and low back pain. She was seen in the ED a couple of days ago with worsening edema and pain. BNP was mildly elevated at 103. X-ray of right him showed no acute findings. She was given a dose of IV Lasix with good urinary output and pain medications.  - She notes some improvement in edema and pain with the IV Lasix but still has significant edema on exam.  - Will increase Lasix to 80mg  to once daily (initially recommended twice daily for at least a couple of days but patient did not want to do this due to frequent urination). Will also increase KCl to 40 mEq daily.  - Will repeat BMET in 1 weeks.  - I am not sure that all of this is due to diastolic CHF. Will repeat Echo. Also discussed ordering venous reflux dopplers to help assess for chronic venous insufficiency; however, patient would like to wait to see how she responds to Lasix.  - I still do not think the leg pain is all due to edema. May have a musculoskeletal or spinal component. Recommend following up with Ortho.   Acute on Chronic Diastolic CHF Recent Echo in 08/2021 showed LVEF of  55-60% with indeterminate diastolic parameters. BNP mildly elevated at 103 during recent ED visit. Chest x-ray showed no acute findings.  - Volume status difficult to assess due to body habitus. However, she does have significant lower extremity edema as above. Weight is down 6 lbs from ED visit earlier this week but still up about 25 lbs since last office visit in 07/2021.  - Will repeat Echo given significant edema and syncope.  - Will increase Lasix to 80mg  daily as above. Will also increase KCl to 40 mEq daily.  - Discussed the importance of daily weights and advised patient to let us know if she gain 3lbs in 1 day or 5lbs in week. Also discussed the importance of limiting sodium intake.  - If she does not respond to oral Lasix, may need to be hospitalized for IV lasix. However, am going to try to manage this as an outpatient.   Syncope Patient reported a syncopal episode a couple of days ago after getting up from using the bathroom. No prodromal symptoms prior to this. She states she has had a couple of other similar episodes in the past but none recently.  - Will repeat Echo.  - Will order 30 day Event Monitor. - Possibly vasovagal in nature but given no prodromal symptoms, will get monitor as above to rule out any concerning cardiac arrhythmias.    Permanent Atrial Fibrillation  Initially diagnosed during cardiac catheterization in 06/2019. Plan has been for rate control. - Rate controlled during today's visit.  - Continue Bisoprolol 5mg  daily (as part of the combo Bisoprolol-HCTZ medication). - Continue chronic anticoagulation with Eliquis 5mg  twice daily.    Severe Aortic Stenosis s/p TAVR S/p TAVR in 07/2019. Last Echo in 08/2021 showed normal functioning TAVR with mean gradient of 11 mmHg. - Continue SBE prophylaxis prior to dental procedures.    Mild Ascending Aorta Dilatation Echo in 08/2021 showed mild dilatation of the ascending aorta measuring 41 mm. - Can reassess on repeat Echo.     Hypertension BP well controlled.  - Continue Bisoprolol-HCTZ 5-6.25mg  daily.    Hyperlipidemia History of hyperlipidemia in his chart but not on any statins. Most recent lipid panel in 08/2021: Total Cholesterol 09/03/2021, Triglycerides 88, HDL 54, LDL 117.  - Did not have time to discuss this today given acute concerns above.    Obstructive Sleep Apnea Unable to tolerate CPAP machine in the past.   Disposition: Follow up in 1 month.    Medication Adjustments/Labs and Tests Ordered: Current medicines are reviewed at length with the patient today.  Concerns regarding medicines are outlined above.  Orders Placed This Encounter  Procedures   Basic metabolic panel   CARDIAC EVENT MONITOR   ECHOCARDIOGRAM COMPLETE   Meds ordered this encounter  Medications   furosemide (LASIX) 80 MG tablet    Sig: Take 1 tablet (80 mg total) by mouth daily.    Dispense:  90 tablet    Refill:  3   potassium chloride SA (KLOR-CON M) 20 MEQ tablet    Sig: Take 2 tablets (40 mEq total) by mouth daily.    Dispense:  180 tablet    Refill:  1    Patient Instructions  Medication Instructions:   INCREASE Lasix to 80 mg daily  INCREASE Potassium to 40 meq daily   *If you need a refill on your cardiac medications before your next appointment, please call your pharmacy*  Lab Work: Marjie Skiff, PA-C recommends that you have lab work at the facility in 1 week: Please fax results to [phone]  Attn: Belenda Cruise., LPN for Marjie Skiff, PA-C BMP  If you have labs (blood work) drawn today and your tests are completely normal, you will receive your results only by: MyChart Message (if you have MyChart) OR A paper copy in the mail If you have any lab test that is abnormal or we need to change your treatment, we will call you to review the results.  Testing/Procedures: Your physician has requested that you have an echocardiogram. Echocardiography is a painless test that uses sound waves to  create images of your heart. It provides your doctor with information about the size and shape of your heart and how well your heart's chambers and valves are working. This procedure takes approximately one hour. There are no restrictions for this procedure. Please do NOT wear cologne, perfume, aftershave, or lotions (deodorant is allowed). Please arrive 15 minutes prior to your appointment time.  [employer] Cardiac Event Monitor Instructions  Your physician has requested you wear your cardiac event monitor for 30 days, (1-30). [employer] may call or text to confirm a shipping address. The monitor will be sent to a land address via UPS. [employer] will not ship a monitor to a PO BOX. It typically takes 3-5 days to receive your monitor after it has been enrolled. [employer] will assist with USPS tracking if your package is delayed.  The telephone number for [employer] is [phone]. Once you have received your monitor, please review the enclosed instructions. Instruction tutorials can also be viewed under help and settings on the enclosed cell phone. Your monitor has already been registered assigning a specific monitor serial # to you.  Billing and Self Pay Discount Information  [employer] has been provided the insurance information we had on file for you.  If your insurance has been updated, please call [employer] at [phone] to provide them with your updated insurance information.   [employer] offers a discounted Self Pay option for patients who have insurance that does not cover their cardiac event monitor or patients without insurance.  The discounted cost of a Self Pay Cardiac Event Monitor would be $225.00 , if the patient contacts [employer] at [phone] within 7 days of applying the monitor to make payment arrangements.  If the patient does not contact [employer] within 7 days of applying the monitor, the cost of the cardiac event monitor will be $350.00.  Applying the  monitor  Remove cell phone from case and turn it on. The cell phone works as IT consultant and needs to be within [employer] of you at all times. The cell phone will need to be charged on a daily basis. We recommend you plug the cell phone into the enclosed charger at your bedside table every night.  Monitor batteries: You will receive two monitor batteries labelled #1 and #2. These are your recorders. Plug battery #2 onto the second connection on the enclosed charger. Keep one battery on the charger at all times. This will keep the monitor battery deactivated. It will also keep it fully charged for when you need to switch your monitor batteries. A small light will be blinking on the battery emblem when it is charging. The light on the battery emblem will remain on when the battery is fully charged.  Open package of a Monitor strip. Insert battery #1 into black hood on strip and gently squeeze monitor battery onto connection as indicated in instruction booklet. Set aside while preparing skin.  Choose location for your strip, vertical or horizontal, as indicated in the instruction booklet. Shave to remove all hair from location. There cannot be any lotions, oils, powders, or colognes on skin where monitor is to be applied. Wipe skin clean with enclosed Saline wipe. Dry skin completely.  Peel paper labeled #1 off the back of the Monitor strip exposing the adhesive. Place the monitor on the chest in the vertical or horizontal position shown in the instruction booklet. One arrow on the monitor strip must be pointing upward. Carefully remove paper labeled #2, attaching remainder of strip to your skin. Try not to create any folds or wrinkles in the strip as you apply it.  Firmly press and release the circle in the center of the monitor battery. You will hear a small beep. This is turning the monitor battery on. The heart emblem on the monitor battery will light up every 5 seconds if the monitor  battery in turned on and connected to the patient securely. Do not push and hold the circle down as this turns the monitor battery off. The cell phone will locate the monitor battery. A screen will appear on the cell phone checking the connection of your monitor strip. This may read poor connection initially but change to good connection within the next minute. Once your monitor accepts the connection you will hear a series of 3 beeps followed by a climbing crescendo  of beeps. A screen will appear on the cell phone showing the two monitor strip placement options. Touch the picture that demonstrates where you applied the monitor strip.  Your monitor strip and battery are waterproof. You are able to shower, bathe, or swim with the monitor on. They just ask you do not submerge deeper than 3 feet underwater. We recommend removing the monitor if you are swimming in a lake, river, or ocean.  Your monitor battery will need to be switched to a fully charged monitor battery approximately once a week. The cell phone will alert you of an action which needs to be made.  On the cell phone, tap for details to reveal connection status, monitor battery status, and cell phone battery status. The green dots indicates your monitor is in good status. A red dot indicates there is something that needs your attention.  To record a symptom, click the circle on the monitor battery. In 30-60 seconds a list of symptoms will appear on the cell phone. Select your symptom and tap save. Your monitor will record a sustained or significant arrhythmia regardless of you clicking the button. Some patients do not feel the heart rhythm irregularities. [employer] will notify us of any serious or critical events.  Refer to instruction booklet for instructions on switching batteries, changing strips, the Do not disturb or Pause features, or any additional questions.  Call [employer] at [phone], to confirm your monitor is  transmitting and record your baseline. They will answer any questions you may have regarding the monitor instructions at that time.  Returning the monitor to [employer]  Place all equipment back into blue box. Peel off strip of paper to expose adhesive and close box securely. There is a prepaid UPS shipping label on this box. Drop in a UPS drop box, or at a UPS facility like [employer]. You may also contact [employer] to arrange UPS to pick up monitor package at your home.   Follow-Up: At [facility], you and your health needs are our priority.  As part of our continuing mission to provide you with exceptional heart care, we have created designated Provider Care Teams.  These Care Teams include your primary Cardiologist (physician) and Advanced Practice Providers (APPs -  Physician Assistants and Nurse Practitioners) who all work together to provide you with the care you need, when you need it.  Your next appointment:   1 month(s)  Provider:   [name], MD  or Marjie Skiff, PA-C        Other Instructions Patient needs to be weighed EVERY morning after going to the bathroom, before eating or drinking anything. Write this number down in a weight log/diary. If you gain 3 pounds overnight or 5 pounds in a week, call the office.      Signed, Corrin Parker, PA-C  05/01/2022 11:12 PM    [facility]

## 2022-04-25 DIAGNOSIS — M62512 Muscle wasting and atrophy, not elsewhere classified, left shoulder: Secondary | ICD-10-CM | POA: Diagnosis not present

## 2022-04-25 DIAGNOSIS — R278 Other lack of coordination: Secondary | ICD-10-CM | POA: Diagnosis not present

## 2022-04-25 DIAGNOSIS — R296 Repeated falls: Secondary | ICD-10-CM | POA: Diagnosis not present

## 2022-04-25 DIAGNOSIS — R2681 Unsteadiness on feet: Secondary | ICD-10-CM | POA: Diagnosis not present

## 2022-04-25 DIAGNOSIS — M62511 Muscle wasting and atrophy, not elsewhere classified, right shoulder: Secondary | ICD-10-CM | POA: Diagnosis not present

## 2022-04-27 DIAGNOSIS — M62512 Muscle wasting and atrophy, not elsewhere classified, left shoulder: Secondary | ICD-10-CM | POA: Diagnosis not present

## 2022-04-27 DIAGNOSIS — M62511 Muscle wasting and atrophy, not elsewhere classified, right shoulder: Secondary | ICD-10-CM | POA: Diagnosis not present

## 2022-04-27 DIAGNOSIS — R296 Repeated falls: Secondary | ICD-10-CM | POA: Diagnosis not present

## 2022-04-27 DIAGNOSIS — R2681 Unsteadiness on feet: Secondary | ICD-10-CM | POA: Diagnosis not present

## 2022-04-27 DIAGNOSIS — R278 Other lack of coordination: Secondary | ICD-10-CM | POA: Diagnosis not present

## 2022-04-28 ENCOUNTER — Encounter (HOSPITAL_COMMUNITY): Payer: Self-pay

## 2022-04-28 ENCOUNTER — Emergency Department (HOSPITAL_COMMUNITY)
Admission: EM | Admit: 2022-04-28 | Discharge: 2022-04-29 | Disposition: A | Discharge status: Home / Self Care | Payer: Medicare PPO | Attending: Emergency Medicine | Admitting: Emergency Medicine

## 2022-04-28 ENCOUNTER — Other Ambulatory Visit: Payer: Self-pay

## 2022-04-28 ENCOUNTER — Emergency Department (HOSPITAL_COMMUNITY): Payer: Medicare PPO

## 2022-04-28 DIAGNOSIS — R600 Localized edema: Secondary | ICD-10-CM | POA: Diagnosis not present

## 2022-04-28 DIAGNOSIS — R0602 Shortness of breath: Secondary | ICD-10-CM | POA: Insufficient documentation

## 2022-04-28 DIAGNOSIS — G20C Parkinsonism, unspecified: Secondary | ICD-10-CM | POA: Insufficient documentation

## 2022-04-28 DIAGNOSIS — Z79899 Other long term (current) drug therapy: Secondary | ICD-10-CM | POA: Insufficient documentation

## 2022-04-28 DIAGNOSIS — E039 Hypothyroidism, unspecified: Secondary | ICD-10-CM | POA: Insufficient documentation

## 2022-04-28 DIAGNOSIS — M25551 Pain in right hip: Secondary | ICD-10-CM | POA: Diagnosis not present

## 2022-04-28 DIAGNOSIS — R609 Edema, unspecified: Secondary | ICD-10-CM

## 2022-04-28 DIAGNOSIS — I10 Essential (primary) hypertension: Secondary | ICD-10-CM | POA: Insufficient documentation

## 2022-04-28 LAB — CBC WITH DIFFERENTIAL/PLATELET
Abs Immature Granulocytes: 0.04 10*3/uL (ref 0.00–0.07)
Basophils Absolute: 0 10*3/uL (ref 0.0–0.1)
Basophils Relative: 0 %
Eosinophils Absolute: 0.1 10*3/uL (ref 0.0–0.5)
Eosinophils Relative: 1 %
HCT: 42 % (ref 36.0–46.0)
Hemoglobin: 12.3 g/dL (ref 12.0–15.0)
Immature Granulocytes: 0 %
Lymphocytes Relative: 11 %
Lymphs Abs: 1 10*3/uL (ref 0.7–4.0)
MCH: 25.6 pg — ABNORMAL LOW (ref 26.0–34.0)
MCHC: 29.3 g/dL — ABNORMAL LOW (ref 30.0–36.0)
MCV: 87.5 fL (ref 80.0–100.0)
Monocytes Absolute: 1 10*3/uL (ref 0.1–1.0)
Monocytes Relative: 11 %
Neutro Abs: 6.8 10*3/uL (ref 1.7–7.7)
Neutrophils Relative %: 77 %
Platelets: 125 10*3/uL — ABNORMAL LOW (ref 150–400)
RBC: 4.8 MIL/uL (ref 3.87–5.11)
RDW: 17.4 % — ABNORMAL HIGH (ref 11.5–15.5)
WBC: 9 10*3/uL (ref 4.0–10.5)
nRBC: 0 % (ref 0.0–0.2)

## 2022-04-28 LAB — BRAIN NATRIURETIC PEPTIDE: B Natriuretic Peptide: 103.5 pg/mL — ABNORMAL HIGH (ref 0.0–100.0)

## 2022-04-28 MED ORDER — OXYCODONE-ACETAMINOPHEN 5-325 MG PO TABS: 0.5000 | ORAL_TABLET | Freq: Four times a day (QID) | ORAL | 0 refills | Status: DC | PRN

## 2022-04-28 MED ORDER — FENTANYL CITRATE PF 50 MCG/ML IJ SOSY
50.0000 ug | PREFILLED_SYRINGE | Freq: Once | INTRAMUSCULAR | Status: AC
  Administered 2022-04-28: 50 ug via INTRAVENOUS
  Filled 2022-04-28: qty 1

## 2022-04-28 MED ORDER — OXYCODONE-ACETAMINOPHEN 5-325 MG PO TABS
1.0000 | ORAL_TABLET | Freq: Once | ORAL | Status: AC
  Administered 2022-04-28: 1 via ORAL
  Filled 2022-04-28: qty 1

## 2022-04-28 NOTE — Discharge Instructions (Addendum)
Continue taking your lasix once daily as prescribed by your doctor

## 2022-04-28 NOTE — ED Triage Notes (Signed)
Pt BIBA from [facility] for RLE swelling and pain that radiates to her hip and back. Pt was recently put back on Lasix, has hx of Afib and HTN. 197/107 BP 113 HR 96% r/a

## 2022-04-28 NOTE — ED Provider Notes (Signed)
[place] AT [facility] [address] Provider Note   CSN: [id number] Arrival date & time: 04/28/22  2010     History  Chief Complaint  Patient presents with   Leg Swelling    Cindy Richmond is a 72 y.o. female.  HPI Patient presents with increasing swelling.  Also pain in her right leg and right hip.  Does have chronic arthritis.  States worsening pain.  Is on chronic anticoagulation due to A-fib.  States more swelling.  States she has not been able to lay flat but states that is because she has Parkinson's and cannot get her legs up to be able to get in the bed.  Occasional coughing.  I reviewed cardiology notes from about 2 weeks ago.  At that point reported weight was 250 and cardiology complemented her that she had been down 25 pounds.  However patient had not weighed herself at home and did not weigh in the office.  Today the weight is 312 pounds on her scale.  I do not think she is put 60 pounds on in the last 2 weeks but I think she probably has put on weight.  No chest pain.   Past Medical History:  Diagnosis Date   Atrial fibrillation, persistent ([place])    Dyspnea    with head low, sleep apnea   Dysrhythmia    afib   GERD (gastroesophageal reflux disease)    H/O hiatal hernia    Hematuria    History of kidney stones    HOH (hard of hearing)    sightly, bilaterally   Hyperlipidemia    Hypertension    Hypothyroidism    Parkinson disease    S/P TAVR (transcatheter aortic valve replacement) 08/03/2019   s/p TAVR with 26 mm Edwards S3U via the TF approach by Drs Buena Irish & Roxy Manns   Severe aortic stenosis    Sleep apnea     Home Medications Prior to Admission medications   Medication Sig Start Date End Date Taking? Authorizing Provider  oxyCODONE-acetaminophen (PERCOCET/ROXICET) 5-325 MG tablet Take 0.5-1 tablets by mouth every 6 (six) hours as needed for severe pain. 04/28/22  Yes Davonna Belling, MD  AMBULATORY NON [place]  chair Dx:  G20.A1 01/29/22   Tat, Eustace Quail, DO  AMBULATORY NON FORMULARY MEDICATION Wheelchair Dx: G20.A1 01/29/22   Tat, Eustace Quail, DO  apixaban (ELIQUIS) 5 MG TABS tablet Take 1 tablet (5 mg total) by mouth 2 (two) times daily. 04/17/22   Lelon Perla, MD  bisoprolol-hydrochlorothiazide (ZIAC) 5-6.25 MG tablet Take 1 tablet by mouth daily. 10/12/18   [provider]  carbidopa-levodopa (SINEMET IR) 25-100 MG tablet 2 AT 8AM, 2 AT NOON, 1 AT 4PM, AND 1 AT [place] AS NEEDED 02/26/22   Tat, Eustace Quail, DO  cephALEXin (KEFLEX) 500 MG capsule Take 2,000 mg (4 capsules) one hour to all dental visits. 08/11/19   Eileen Stanford, PA-C  cholecalciferol (VITAMIN D) 25 MCG (1000 UNIT) tablet Take 1,000 Units by mouth daily.    [provider]  Collagen-Boron-Hyaluronic Acid ([employer] JOINT HEALTH TRIPLE ACTION PO) Take 1 tablet by mouth daily. Triple Action Joint Health    [provider]  Cyanocobalamin (VITAMIN B-12) 5000 MCG TBDP Take 5,000 mcg by mouth daily.    [provider]  docusate sodium (COLACE) 100 MG capsule Take 200 mg by mouth daily as needed (constipation.).    [provider]  esomeprazole (NEXIUM) 20 MG capsule  Take 20 mg by mouth daily.     [provider]  furosemide (LASIX) 40 MG tablet Take 1 tablet (40 mg total) by mouth daily. 04/17/22 07/16/22  Lelon Perla, MD  levothyroxine (SYNTHROID, LEVOTHROID) 75 MCG tablet Take 75 mcg by mouth daily before breakfast.     [provider]  Menthol-Methyl Salicylate (SALONPAS PAIN RELIEF PATCH) [place] 1 patch onto the skin daily as needed (pain.).    [provider]  Multiple Vitamins-Minerals (PRESERVISION AREDS PO) Take 1 tablet by mouth daily.    [provider]  Omega-3 Fatty Acids (FISH OIL) 1200 MG CAPS Take 1,200 mg by mouth daily.    [provider]  Polyethyl Glycol-Propyl Glycol (SYSTANE OP) Place 1 drop into both eyes in the  morning and at bedtime.    [provider]  potassium chloride SA (KLOR-CON M) 20 MEQ tablet Take 1 tablet (20 mEq total) by mouth daily. 04/17/22   Lelon Perla, MD      Allergies    Nickel and Penicillins    Review of Systems   Review of Systems  Physical Exam Updated Vital Signs BP (!) 142/95 (BP Location: Right Arm)   Pulse 99   Temp (!) 97.5 F (36.4 C) (Oral)   Resp 18   Ht '5\' 5"'$  (1.651 m)   Wt (!) 141.9 kg   SpO2 99%   BMI 52.05 kg/m  Physical Exam Vitals and nursing note reviewed.  HENT:     Head: Normocephalic.  Eyes:     Pupils: Pupils are equal, round, and reactive to light.  Cardiovascular:     Rate and Rhythm: Normal rate. Rhythm irregular.  Pulmonary:     Comments:  mildly harsh breath sounds without focal rales Abdominal:     Tenderness: There is no abdominal tenderness.  Musculoskeletal:     Cervical back: Neck supple.     Right lower leg: Edema present.     Left lower leg: Edema present.     Comments: Moderate to severe pitting edema bilateral lower extremities.  Neurological:     Mental Status: She is alert and oriented to person, place, and time.     ED Results / Procedures / Treatments   Labs (all labs ordered are listed, but only abnormal results are displayed) Labs Reviewed  BRAIN NATRIURETIC PEPTIDE - Abnormal; Notable for the following components:      Result Value   B Natriuretic Peptide 103.5 (*)    All other components within normal limits  CBC WITH DIFFERENTIAL/PLATELET - Abnormal; Notable for the following components:   MCH 25.6 (*)    MCHC 29.3 (*)    RDW 17.4 (*)    Platelets 125 (*)    All other components within normal limits  COMPREHENSIVE METABOLIC PANEL    EKG EKG Interpretation  Date/Time:  Sunday April 28 2022 21:26:05 EST Ventricular Rate:  107 PR Interval:    QRS Duration: 100 QT Interval:  330 QTC Calculation: 441 R Axis:   31 Text Interpretation: Atrial fibrillation RSR' in V1 or V2, right  VCD or RVH Confirmed by Davonna Belling [phone]) on 04/28/2022 10:02:43 PM  Radiology DG Hip Unilat W or Wo Pelvis 2-3 Views Right  Result Date: 04/28/2022 CLINICAL DATA:  Right hip pain, shortness of breath EXAM: DG HIP (WITH OR WITHOUT PELVIS) 2-3V RIGHT COMPARISON:  None Available. FINDINGS: Limited study by body habitus. No visible fracture. No subluxation or dislocation. Hip joints and SI joints symmetric.  IMPRESSION: No visible acute bony abnormality. Electronically Signed   By: Rolm Baptise M.D.   On: 04/28/2022 22:24   DG Chest Portable 1 View  Result Date: 04/28/2022 CLINICAL DATA:  Shortness of breath. EXAM: PORTABLE CHEST 1 VIEW COMPARISON:  Chest radiograph dated 07/30/2019. FINDINGS: There is mild eventration of the right hemidiaphragm. No focal consolidation, pleural effusion, or pneumothorax. Stable cardiac silhouette. Aortic valve repair. No acute osseous pathology. Left shoulder arthroplasty. IMPRESSION: No active disease. Electronically Signed   By: Anner Crete M.D.   On: 04/28/2022 22:14    Procedures Procedures    Medications Ordered in ED Medications  fentaNYL (SUBLIMAZE) injection 50 mcg (50 mcg Intravenous Given 04/28/22 2119)  oxyCODONE-acetaminophen (PERCOCET/ROXICET) 5-325 MG per tablet 1 tablet (1 tablet Oral Given 04/28/22 2259)    ED Course/ Medical Decision Making/ A&P                             Medical Decision Making Amount and/or Complexity of Data Reviewed Labs: ordered. Radiology: ordered.  Risk Prescription drug management.    patient with right hip pain.  No injury.  Worse with certain movements.  Also worsening edema of both lower extremities.  Weight is up 60 pounds since visit 2 weeks ago but under patient's admission she just told the weight of 250 pounds without weighing herself.  Will get x-ray of the hip and chest.  Blood work to evaluate kidney function and CHF.  Chest x-ray reassuring.  Hip x-ray reassuring.  BNP mildly elevated but  below it had been previously.  Pulse rate mildly elevated but history of A-fib.  Waiting on CMP.  Hopefully should be able to discharge home with cardiology follow-up.  Care turned over to Dr. Ralene Bathe.  Pain medicine given for right hip pain.        Final Clinical Impression(s) / ED Diagnoses Final diagnoses:  Peripheral edema  Pain of right hip    Rx / DC Orders ED Discharge Orders          Ordered    oxyCODONE-acetaminophen (PERCOCET/ROXICET) 5-325 MG tablet  Every 6 hours PRN        04/28/22 2310    Ambulatory referral to Cardiology       Comments: If you have not heard from the Cardiology office within the next 72 hours please call [phone].   04/28/22 2311              Davonna Belling, MD 04/28/22 2342

## 2022-04-29 DIAGNOSIS — M25572 Pain in left ankle and joints of left foot: Secondary | ICD-10-CM | POA: Diagnosis not present

## 2022-04-29 DIAGNOSIS — Z7401 Bed confinement status: Secondary | ICD-10-CM | POA: Diagnosis not present

## 2022-04-29 DIAGNOSIS — M25551 Pain in right hip: Secondary | ICD-10-CM | POA: Diagnosis not present

## 2022-04-29 LAB — COMPREHENSIVE METABOLIC PANEL
ALT: 7 U/L (ref 0–44)
AST: 20 U/L (ref 15–41)
Albumin: 3.6 g/dL (ref 3.5–5.0)
Alkaline Phosphatase: 76 U/L (ref 38–126)
Anion gap: 8 (ref 5–15)
BUN: 23 mg/dL (ref 8–23)
CO2: 27 mmol/L (ref 22–32)
Calcium: 8.8 mg/dL — ABNORMAL LOW (ref 8.9–10.3)
Chloride: 103 mmol/L (ref 98–111)
Creatinine, Ser: 0.94 mg/dL (ref 0.44–1.00)
GFR, Estimated: >60 mL/min (ref >60)
Glucose, Bld: 119 mg/dL — ABNORMAL HIGH (ref 70–99)
Potassium: 4.5 mmol/L (ref 3.5–5.1)
Sodium: 138 mmol/L (ref 135–145)
Total Bilirubin: 0.8 mg/dL (ref 0.3–1.2)
Total Protein: 6.9 g/dL (ref 6.5–8.1)

## 2022-04-29 MED ORDER — FUROSEMIDE 10 MG/ML IJ SOLN
40.0000 mg | Freq: Once | INTRAMUSCULAR | Status: AC
  Administered 2022-04-29: 40 mg via INTRAVENOUS
  Filled 2022-04-29: qty 4

## 2022-04-29 MED ORDER — OXYCODONE-ACETAMINOPHEN 5-325 MG PO TABS: 0.5000 | ORAL_TABLET | Freq: Four times a day (QID) | ORAL | 0 refills | Status: DC | PRN

## 2022-04-29 NOTE — ED Notes (Signed)
Writer called and set up transport via [place] through [employer] to go back to facility. Unknown ETA.

## 2022-04-29 NOTE — ED Notes (Signed)
Med tech at facility notified of patient being discharged and follow up information as well as prescription information

## 2022-04-29 NOTE — ED Notes (Signed)
Pt had 934m urine output in purewick cannister

## 2022-04-30 DIAGNOSIS — M62511 Muscle wasting and atrophy, not elsewhere classified, right shoulder: Secondary | ICD-10-CM | POA: Diagnosis not present

## 2022-04-30 DIAGNOSIS — I5033 Acute on chronic diastolic (congestive) heart failure: Secondary | ICD-10-CM | POA: Diagnosis not present

## 2022-04-30 DIAGNOSIS — R600 Localized edema: Secondary | ICD-10-CM | POA: Diagnosis not present

## 2022-04-30 DIAGNOSIS — I350 Nonrheumatic aortic (valve) stenosis: Secondary | ICD-10-CM | POA: Diagnosis not present

## 2022-04-30 DIAGNOSIS — I4821 Permanent atrial fibrillation: Secondary | ICD-10-CM | POA: Diagnosis not present

## 2022-04-30 DIAGNOSIS — R278 Other lack of coordination: Secondary | ICD-10-CM | POA: Diagnosis not present

## 2022-04-30 DIAGNOSIS — M79661 Pain in right lower leg: Secondary | ICD-10-CM | POA: Diagnosis not present

## 2022-04-30 DIAGNOSIS — R2681 Unsteadiness on feet: Secondary | ICD-10-CM | POA: Diagnosis not present

## 2022-04-30 DIAGNOSIS — M62512 Muscle wasting and atrophy, not elsewhere classified, left shoulder: Secondary | ICD-10-CM | POA: Diagnosis not present

## 2022-04-30 DIAGNOSIS — R296 Repeated falls: Secondary | ICD-10-CM | POA: Diagnosis not present

## 2022-04-30 DIAGNOSIS — N189 Chronic kidney disease, unspecified: Secondary | ICD-10-CM | POA: Diagnosis not present

## 2022-04-30 DIAGNOSIS — I110 Hypertensive heart disease with heart failure: Secondary | ICD-10-CM | POA: Diagnosis not present

## 2022-04-30 DIAGNOSIS — G4733 Obstructive sleep apnea (adult) (pediatric): Secondary | ICD-10-CM | POA: Diagnosis not present

## 2022-04-30 DIAGNOSIS — M79662 Pain in left lower leg: Secondary | ICD-10-CM | POA: Diagnosis not present

## 2022-04-30 DIAGNOSIS — G20B1 Parkinson's disease with dyskinesia, without mention of fluctuations: Secondary | ICD-10-CM | POA: Diagnosis not present

## 2022-05-01 ENCOUNTER — Ambulatory Visit: Payer: Medicare PPO | Attending: Student | Admitting: Student

## 2022-05-01 ENCOUNTER — Encounter: Payer: Self-pay | Admitting: *Deleted

## 2022-05-01 ENCOUNTER — Encounter: Payer: Self-pay | Admitting: Student

## 2022-05-01 VITALS — BP 125/67 | HR 95 | Ht 64.0 in | Wt 306.8 lb

## 2022-05-01 DIAGNOSIS — R600 Localized edema: Secondary | ICD-10-CM

## 2022-05-01 DIAGNOSIS — I4821 Permanent atrial fibrillation: Secondary | ICD-10-CM

## 2022-05-01 DIAGNOSIS — Z952 Presence of prosthetic heart valve: Secondary | ICD-10-CM | POA: Diagnosis not present

## 2022-05-01 DIAGNOSIS — I10 Essential (primary) hypertension: Secondary | ICD-10-CM | POA: Diagnosis not present

## 2022-05-01 DIAGNOSIS — I5033 Acute on chronic diastolic (congestive) heart failure: Secondary | ICD-10-CM | POA: Diagnosis not present

## 2022-05-01 DIAGNOSIS — I350 Nonrheumatic aortic (valve) stenosis: Secondary | ICD-10-CM

## 2022-05-01 DIAGNOSIS — M79604 Pain in right leg: Secondary | ICD-10-CM

## 2022-05-01 DIAGNOSIS — M79605 Pain in left leg: Secondary | ICD-10-CM

## 2022-05-01 DIAGNOSIS — R55 Syncope and collapse: Secondary | ICD-10-CM

## 2022-05-01 DIAGNOSIS — G4733 Obstructive sleep apnea (adult) (pediatric): Secondary | ICD-10-CM

## 2022-05-01 DIAGNOSIS — E785 Hyperlipidemia, unspecified: Secondary | ICD-10-CM

## 2022-05-01 DIAGNOSIS — I77810 Thoracic aortic ectasia: Secondary | ICD-10-CM | POA: Diagnosis not present

## 2022-05-01 DIAGNOSIS — Z79899 Other long term (current) drug therapy: Secondary | ICD-10-CM

## 2022-05-01 MED ORDER — POTASSIUM CHLORIDE CRYS ER 20 MEQ PO TBCR: 40.0000 meq | EXTENDED_RELEASE_TABLET | Freq: Every day | ORAL | 1 refills | Status: AC

## 2022-05-01 MED ORDER — FUROSEMIDE 80 MG PO TABS: 80.0000 mg | ORAL_TABLET | Freq: Every day | ORAL | 3 refills | Status: DC

## 2022-05-01 NOTE — Progress Notes (Unsigned)
Patient enrolled for [employer] to ship a 30 day cardiac event monitor to her address on file.  Dr. Stanford Breed to read.

## 2022-05-01 NOTE — Patient Instructions (Addendum)
Medication Instructions:   INCREASE Lasix to 80 mg daily  INCREASE Potassium to 40 meq daily   *If you need a refill on your cardiac medications before your next appointment, please call your pharmacy*  Lab Work: Sande Rives, PA-C recommends that you have lab work at the facility in 1 week: Please fax results to [phone]  Attn: Meredeth Ide., LPN for Sande Rives, PA-C BMP  If you have labs (blood work) drawn today and your tests are completely normal, you will receive your results only by: [place] (if you have MyChart) OR A paper copy in the mail If you have any lab test that is abnormal or we need to change your treatment, we will call you to review the results.  Testing/Procedures: Your physician has requested that you have an echocardiogram. Echocardiography is a painless test that uses sound waves to create images of your heart. It provides your doctor with information about the size and shape of your heart and how well your heart's chambers and valves are working. This procedure takes approximately one hour. There are no restrictions for this procedure. Please do NOT wear cologne, perfume, aftershave, or lotions (deodorant is allowed). Please arrive 15 minutes prior to your appointment time.  [employer] Cardiac Event Monitor Instructions  Your physician has requested you wear your cardiac event monitor for 30 days, (1-30). [employer] may call or text to confirm a shipping address. The monitor will be sent to a land address via UPS. [employer] will not ship a monitor to a PO BOX. It typically takes 3-5 days to receive your monitor after it has been enrolled. [employer] will assist with USPS tracking if your package is delayed. The telephone number for [employer] is [phone]. Once you have received your monitor, please review the enclosed instructions. Instruction tutorials can also be viewed under help and settings on the enclosed cell phone. Your monitor has  already been registered assigning a specific monitor serial # to you.  Billing and Self Pay Discount Information  [employer] has been provided the insurance information we had on file for you.  If your insurance has been updated, please call [employer] at [phone] to provide them with your updated insurance information.   [employer] offers a discounted Self Pay option for patients who have insurance that does not cover their cardiac event monitor or patients without insurance.  The discounted cost of a Self Pay Cardiac Event Monitor would be $225.00 , if the patient contacts [employer] at [phone] within 7 days of applying the monitor to make payment arrangements.  If the patient does not contact [employer] within 7 days of applying the monitor, the cost of the cardiac event monitor will be $350.00.  Applying the monitor  Remove cell phone from case and turn it on. The cell phone works as Dealer and needs to be within [employer] of you at all times. The cell phone will need to be charged on a daily basis. We recommend you plug the cell phone into the enclosed charger at your bedside table every night.  Monitor batteries: You will receive two monitor batteries labelled #1 and #2. These are your recorders. Plug battery #2 onto the second connection on the enclosed charger. Keep one battery on the charger at all times. This will keep the monitor battery deactivated. It will also keep it fully charged for when you need to switch your monitor batteries. A small light will be blinking on the battery emblem when it is charging. The light  on the battery emblem will remain on when the battery is fully charged.  Open package of a Monitor strip. Insert battery #1 into black hood on strip and gently squeeze monitor battery onto connection as indicated in instruction booklet. Set aside while preparing skin.  Choose location for your strip, vertical or horizontal, as indicated in the  instruction booklet. Shave to remove all hair from location. There cannot be any lotions, oils, powders, or colognes on skin where monitor is to be applied. Wipe skin clean with enclosed Saline wipe. Dry skin completely.  Peel paper labeled #1 off the back of the Monitor strip exposing the adhesive. Place the monitor on the chest in the vertical or horizontal position shown in the instruction booklet. One arrow on the monitor strip must be pointing upward. Carefully remove paper labeled #2, attaching remainder of strip to your skin. Try not to create any folds or wrinkles in the strip as you apply it.  Firmly press and release the circle in the center of the monitor battery. You will hear a small beep. This is turning the monitor battery on. The heart emblem on the monitor battery will light up every 5 seconds if the monitor battery in turned on and connected to the patient securely. Do not push and hold the circle down as this turns the monitor battery off. The cell phone will locate the monitor battery. A screen will appear on the cell phone checking the connection of your monitor strip. This may read poor connection initially but change to good connection within the next minute. Once your monitor accepts the connection you will hear a series of 3 beeps followed by a climbing crescendo of beeps. A screen will appear on the cell phone showing the two monitor strip placement options. Touch the picture that demonstrates where you applied the monitor strip.  Your monitor strip and battery are waterproof. You are able to shower, bathe, or swim with the monitor on. They just ask you do not submerge deeper than 3 feet underwater. We recommend removing the monitor if you are swimming in a lake, river, or ocean.  Your monitor battery will need to be switched to a fully charged monitor battery approximately once a week. The cell phone will alert you of an action which needs to be made.  On the cell  phone, tap for details to reveal connection status, monitor battery status, and cell phone battery status. The green dots indicates your monitor is in good status. A red dot indicates there is something that needs your attention.  To record a symptom, click the circle on the monitor battery. In 30-60 seconds a list of symptoms will appear on the cell phone. Select your symptom and tap save. Your monitor will record a sustained or significant arrhythmia regardless of you clicking the button. Some patients do not feel the heart rhythm irregularities. [employer] will notify us of any serious or critical events.  Refer to instruction booklet for instructions on switching batteries, changing strips, the Do not disturb or Pause features, or any additional questions.  Call [employer] at [phone], to confirm your monitor is transmitting and record your baseline. They will answer any questions you may have regarding the monitor instructions at that time.  Returning the monitor to [facility] all equipment back into blue box. Peel off strip of paper to expose adhesive and close box securely. There is a prepaid UPS shipping label on this box. Drop in a UPS drop box, or  at a [facility]. You may also contact [employer] to arrange UPS to pick up monitor package at your home.   Follow-Up: At [facility], you and your health needs are our priority.  As part of our continuing mission to provide you with exceptional heart care, we have created designated Provider Care Teams.  These Care Teams include your primary Cardiologist (physician) and Advanced Practice Providers (APPs -  Physician Assistants and Nurse Practitioners) who all work together to provide you with the care you need, when you need it.  Your next appointment:   1 month(s)  Provider:   Kirk Ruths, MD  or Sande Rives, PA-C        Other Instructions Patient needs to be weighed EVERY morning after  going to the bathroom, before eating or drinking anything. Write this number down in a weight log/diary. If you gain 3 pounds overnight or 5 pounds in a week, call the office.

## 2022-05-02 DIAGNOSIS — R278 Other lack of coordination: Secondary | ICD-10-CM | POA: Diagnosis not present

## 2022-05-02 DIAGNOSIS — R296 Repeated falls: Secondary | ICD-10-CM | POA: Diagnosis not present

## 2022-05-02 DIAGNOSIS — M62511 Muscle wasting and atrophy, not elsewhere classified, right shoulder: Secondary | ICD-10-CM | POA: Diagnosis not present

## 2022-05-02 DIAGNOSIS — M62512 Muscle wasting and atrophy, not elsewhere classified, left shoulder: Secondary | ICD-10-CM | POA: Diagnosis not present

## 2022-05-02 DIAGNOSIS — R2681 Unsteadiness on feet: Secondary | ICD-10-CM | POA: Diagnosis not present

## 2022-05-07 DIAGNOSIS — G20B1 Parkinson's disease with dyskinesia, without mention of fluctuations: Secondary | ICD-10-CM | POA: Diagnosis not present

## 2022-05-07 DIAGNOSIS — I5033 Acute on chronic diastolic (congestive) heart failure: Secondary | ICD-10-CM | POA: Diagnosis not present

## 2022-05-07 DIAGNOSIS — R296 Repeated falls: Secondary | ICD-10-CM | POA: Diagnosis not present

## 2022-05-07 DIAGNOSIS — I4821 Permanent atrial fibrillation: Secondary | ICD-10-CM | POA: Diagnosis not present

## 2022-05-07 DIAGNOSIS — M62512 Muscle wasting and atrophy, not elsewhere classified, left shoulder: Secondary | ICD-10-CM | POA: Diagnosis not present

## 2022-05-07 DIAGNOSIS — R278 Other lack of coordination: Secondary | ICD-10-CM | POA: Diagnosis not present

## 2022-05-07 DIAGNOSIS — M62511 Muscle wasting and atrophy, not elsewhere classified, right shoulder: Secondary | ICD-10-CM | POA: Diagnosis not present

## 2022-05-07 DIAGNOSIS — R2681 Unsteadiness on feet: Secondary | ICD-10-CM | POA: Diagnosis not present

## 2022-05-08 ENCOUNTER — Telehealth: Payer: Self-pay

## 2022-05-08 DIAGNOSIS — Z79899 Other long term (current) drug therapy: Secondary | ICD-10-CM | POA: Diagnosis not present

## 2022-05-08 NOTE — Telephone Encounter (Signed)
        Patient  visited [facility] on 3/4     Telephone encounter attempt :  1st  A HIPAA compliant voice message was left requesting a return call.  Instructed patient to call back .   [facility] [phone] [address], [place], [place] [address] Phone: [phone] Email: [email or web address]

## 2022-05-09 DIAGNOSIS — M62511 Muscle wasting and atrophy, not elsewhere classified, right shoulder: Secondary | ICD-10-CM | POA: Diagnosis not present

## 2022-05-09 DIAGNOSIS — R2681 Unsteadiness on feet: Secondary | ICD-10-CM | POA: Diagnosis not present

## 2022-05-09 DIAGNOSIS — R296 Repeated falls: Secondary | ICD-10-CM | POA: Diagnosis not present

## 2022-05-09 DIAGNOSIS — R278 Other lack of coordination: Secondary | ICD-10-CM | POA: Diagnosis not present

## 2022-05-09 DIAGNOSIS — M62512 Muscle wasting and atrophy, not elsewhere classified, left shoulder: Secondary | ICD-10-CM | POA: Diagnosis not present

## 2022-05-10 DIAGNOSIS — I70203 Unspecified atherosclerosis of native arteries of extremities, bilateral legs: Secondary | ICD-10-CM | POA: Diagnosis not present

## 2022-05-14 DIAGNOSIS — R296 Repeated falls: Secondary | ICD-10-CM | POA: Diagnosis not present

## 2022-05-14 DIAGNOSIS — M62512 Muscle wasting and atrophy, not elsewhere classified, left shoulder: Secondary | ICD-10-CM | POA: Diagnosis not present

## 2022-05-14 DIAGNOSIS — R278 Other lack of coordination: Secondary | ICD-10-CM | POA: Diagnosis not present

## 2022-05-14 DIAGNOSIS — R2681 Unsteadiness on feet: Secondary | ICD-10-CM | POA: Diagnosis not present

## 2022-05-14 DIAGNOSIS — M62511 Muscle wasting and atrophy, not elsewhere classified, right shoulder: Secondary | ICD-10-CM | POA: Diagnosis not present

## 2022-05-15 DIAGNOSIS — G20B1 Parkinson's disease with dyskinesia, without mention of fluctuations: Secondary | ICD-10-CM | POA: Diagnosis not present

## 2022-05-15 DIAGNOSIS — I5033 Acute on chronic diastolic (congestive) heart failure: Secondary | ICD-10-CM | POA: Diagnosis not present

## 2022-05-15 DIAGNOSIS — R600 Localized edema: Secondary | ICD-10-CM | POA: Diagnosis not present

## 2022-05-15 DIAGNOSIS — I4821 Permanent atrial fibrillation: Secondary | ICD-10-CM | POA: Diagnosis not present

## 2022-05-16 DIAGNOSIS — M62511 Muscle wasting and atrophy, not elsewhere classified, right shoulder: Secondary | ICD-10-CM | POA: Diagnosis not present

## 2022-05-16 DIAGNOSIS — M62512 Muscle wasting and atrophy, not elsewhere classified, left shoulder: Secondary | ICD-10-CM | POA: Diagnosis not present

## 2022-05-16 DIAGNOSIS — R296 Repeated falls: Secondary | ICD-10-CM | POA: Diagnosis not present

## 2022-05-16 DIAGNOSIS — R2681 Unsteadiness on feet: Secondary | ICD-10-CM | POA: Diagnosis not present

## 2022-05-16 DIAGNOSIS — R278 Other lack of coordination: Secondary | ICD-10-CM | POA: Diagnosis not present

## 2022-05-21 DIAGNOSIS — M62512 Muscle wasting and atrophy, not elsewhere classified, left shoulder: Secondary | ICD-10-CM | POA: Diagnosis not present

## 2022-05-21 DIAGNOSIS — R2681 Unsteadiness on feet: Secondary | ICD-10-CM | POA: Diagnosis not present

## 2022-05-21 DIAGNOSIS — R296 Repeated falls: Secondary | ICD-10-CM | POA: Diagnosis not present

## 2022-05-21 DIAGNOSIS — M62511 Muscle wasting and atrophy, not elsewhere classified, right shoulder: Secondary | ICD-10-CM | POA: Diagnosis not present

## 2022-05-21 DIAGNOSIS — R278 Other lack of coordination: Secondary | ICD-10-CM | POA: Diagnosis not present

## 2022-05-23 DIAGNOSIS — R296 Repeated falls: Secondary | ICD-10-CM | POA: Diagnosis not present

## 2022-05-23 DIAGNOSIS — E038 Other specified hypothyroidism: Secondary | ICD-10-CM | POA: Diagnosis not present

## 2022-05-23 DIAGNOSIS — M62512 Muscle wasting and atrophy, not elsewhere classified, left shoulder: Secondary | ICD-10-CM | POA: Diagnosis not present

## 2022-05-23 DIAGNOSIS — M62511 Muscle wasting and atrophy, not elsewhere classified, right shoulder: Secondary | ICD-10-CM | POA: Diagnosis not present

## 2022-05-23 DIAGNOSIS — R278 Other lack of coordination: Secondary | ICD-10-CM | POA: Diagnosis not present

## 2022-05-23 DIAGNOSIS — R2681 Unsteadiness on feet: Secondary | ICD-10-CM | POA: Diagnosis not present

## 2022-05-23 DIAGNOSIS — I110 Hypertensive heart disease with heart failure: Secondary | ICD-10-CM | POA: Diagnosis not present

## 2022-05-28 DIAGNOSIS — G20B1 Parkinson's disease with dyskinesia, without mention of fluctuations: Secondary | ICD-10-CM | POA: Diagnosis not present

## 2022-05-28 DIAGNOSIS — M62511 Muscle wasting and atrophy, not elsewhere classified, right shoulder: Secondary | ICD-10-CM | POA: Diagnosis not present

## 2022-05-28 DIAGNOSIS — R296 Repeated falls: Secondary | ICD-10-CM | POA: Diagnosis not present

## 2022-05-28 DIAGNOSIS — R278 Other lack of coordination: Secondary | ICD-10-CM | POA: Diagnosis not present

## 2022-05-28 DIAGNOSIS — M62512 Muscle wasting and atrophy, not elsewhere classified, left shoulder: Secondary | ICD-10-CM | POA: Diagnosis not present

## 2022-05-28 DIAGNOSIS — R2681 Unsteadiness on feet: Secondary | ICD-10-CM | POA: Diagnosis not present

## 2022-05-28 DIAGNOSIS — I110 Hypertensive heart disease with heart failure: Secondary | ICD-10-CM | POA: Diagnosis not present

## 2022-05-28 DIAGNOSIS — I5033 Acute on chronic diastolic (congestive) heart failure: Secondary | ICD-10-CM | POA: Diagnosis not present

## 2022-05-30 DIAGNOSIS — R278 Other lack of coordination: Secondary | ICD-10-CM | POA: Diagnosis not present

## 2022-05-30 DIAGNOSIS — M62511 Muscle wasting and atrophy, not elsewhere classified, right shoulder: Secondary | ICD-10-CM | POA: Diagnosis not present

## 2022-05-30 DIAGNOSIS — M62512 Muscle wasting and atrophy, not elsewhere classified, left shoulder: Secondary | ICD-10-CM | POA: Diagnosis not present

## 2022-05-30 DIAGNOSIS — R2681 Unsteadiness on feet: Secondary | ICD-10-CM | POA: Diagnosis not present

## 2022-05-30 DIAGNOSIS — R296 Repeated falls: Secondary | ICD-10-CM | POA: Diagnosis not present

## 2022-06-04 ENCOUNTER — Ambulatory Visit (HOSPITAL_COMMUNITY): Payer: Medicare PPO | Attending: Student

## 2022-06-04 DIAGNOSIS — R55 Syncope and collapse: Secondary | ICD-10-CM | POA: Diagnosis not present

## 2022-06-04 LAB — ECHOCARDIOGRAM COMPLETE
AR max vel: 1.56 cm2
AV Area VTI: 1.69 cm2
AV Area mean vel: 1.49 cm2
AV Mean grad: 9.3 mmHg
AV Peak grad: 17.6 mmHg
Ao pk vel: 2.1 m/s
S' Lateral: 3.5 cm

## 2022-06-06 ENCOUNTER — Encounter: Payer: Self-pay | Admitting: Cardiology

## 2022-06-06 DIAGNOSIS — M62512 Muscle wasting and atrophy, not elsewhere classified, left shoulder: Secondary | ICD-10-CM | POA: Diagnosis not present

## 2022-06-06 DIAGNOSIS — R296 Repeated falls: Secondary | ICD-10-CM | POA: Diagnosis not present

## 2022-06-06 DIAGNOSIS — R600 Localized edema: Secondary | ICD-10-CM

## 2022-06-06 DIAGNOSIS — R278 Other lack of coordination: Secondary | ICD-10-CM | POA: Diagnosis not present

## 2022-06-06 DIAGNOSIS — M62511 Muscle wasting and atrophy, not elsewhere classified, right shoulder: Secondary | ICD-10-CM | POA: Diagnosis not present

## 2022-06-06 DIAGNOSIS — R2681 Unsteadiness on feet: Secondary | ICD-10-CM | POA: Diagnosis not present

## 2022-06-07 MED ORDER — FUROSEMIDE 80 MG PO TABS: 40.0000 mg | ORAL_TABLET | Freq: Every day | ORAL | 3 refills | Status: DC

## 2022-06-07 MED ORDER — FUROSEMIDE 80 MG PO TABS: ORAL_TABLET | ORAL | 3 refills | Status: DC

## 2022-06-07 NOTE — Addendum Note (Signed)
Addended by: Candie Chroman on: 06/07/2022 02:09 PM   Modules accepted: Orders

## 2022-06-12 DIAGNOSIS — M6259 Muscle wasting and atrophy, not elsewhere classified, multiple sites: Secondary | ICD-10-CM | POA: Diagnosis not present

## 2022-06-12 DIAGNOSIS — G20B2 Parkinson's disease with dyskinesia, with fluctuations: Secondary | ICD-10-CM | POA: Diagnosis not present

## 2022-06-12 DIAGNOSIS — R278 Other lack of coordination: Secondary | ICD-10-CM | POA: Diagnosis not present

## 2022-06-12 DIAGNOSIS — R2689 Other abnormalities of gait and mobility: Secondary | ICD-10-CM | POA: Diagnosis not present

## 2022-06-14 DIAGNOSIS — R278 Other lack of coordination: Secondary | ICD-10-CM | POA: Diagnosis not present

## 2022-06-14 DIAGNOSIS — R2689 Other abnormalities of gait and mobility: Secondary | ICD-10-CM | POA: Diagnosis not present

## 2022-06-14 DIAGNOSIS — G20B2 Parkinson's disease with dyskinesia, with fluctuations: Secondary | ICD-10-CM | POA: Diagnosis not present

## 2022-06-14 DIAGNOSIS — M6259 Muscle wasting and atrophy, not elsewhere classified, multiple sites: Secondary | ICD-10-CM | POA: Diagnosis not present

## 2022-06-18 DIAGNOSIS — G20B2 Parkinson's disease with dyskinesia, with fluctuations: Secondary | ICD-10-CM | POA: Diagnosis not present

## 2022-06-18 DIAGNOSIS — R278 Other lack of coordination: Secondary | ICD-10-CM | POA: Diagnosis not present

## 2022-06-18 DIAGNOSIS — M6259 Muscle wasting and atrophy, not elsewhere classified, multiple sites: Secondary | ICD-10-CM | POA: Diagnosis not present

## 2022-06-18 DIAGNOSIS — R2689 Other abnormalities of gait and mobility: Secondary | ICD-10-CM | POA: Diagnosis not present

## 2022-06-19 DIAGNOSIS — E038 Other specified hypothyroidism: Secondary | ICD-10-CM | POA: Diagnosis not present

## 2022-06-19 DIAGNOSIS — Z7901 Long term (current) use of anticoagulants: Secondary | ICD-10-CM | POA: Diagnosis not present

## 2022-06-19 DIAGNOSIS — G20C Parkinsonism, unspecified: Secondary | ICD-10-CM | POA: Diagnosis not present

## 2022-06-19 DIAGNOSIS — E876 Hypokalemia: Secondary | ICD-10-CM | POA: Diagnosis not present

## 2022-06-19 DIAGNOSIS — I5032 Chronic diastolic (congestive) heart failure: Secondary | ICD-10-CM | POA: Diagnosis not present

## 2022-06-19 DIAGNOSIS — I480 Paroxysmal atrial fibrillation: Secondary | ICD-10-CM | POA: Diagnosis not present

## 2022-06-19 NOTE — Progress Notes (Signed)
HPI: FU TAVR and atrial fibrillation. Carotid Dopplers March 2021 showed no significant stenosis.  Echocardiogram June 2021 showed normal LV function, mild mitral regurgitation, severe aortic stenosis with mean gradient 31 mmHg and aortic valve area 0.63 cm.  Cardiac catheterization May 2021 showed no coronary disease and severe aortic stenosis.  Had TAVR June 2021.  Last echocardiogram April 2024 showed normal LV function, mild left ventricular hypertrophy, status post aortic valve replacement with mean gradient 9 mmHg and no aortic insufficiency.  Seen recently in the emergency room with increased lower extremity edema.  Follow-up in the office and Lasix increased.  Also had syncopal episode and monitor ordered but not performed.  Since last seen, she does have dyspnea which is chronic.  She denies chest pain palpitations or syncope.  She has chronic pedal edema.  Current Outpatient Medications  Medication Sig Dispense Refill   AMBULATORY NON FORMULARY MEDICATION Lift chair Dx:  G20.A1 1 Device 0   AMBULATORY NON FORMULARY MEDICATION Wheelchair Dx: G20.A1 1 Device 0   apixaban (ELIQUIS) 5 MG TABS tablet Take 1 tablet (5 mg total) by mouth 2 (two) times daily. 60 tablet 5   bisoprolol-hydrochlorothiazide (ZIAC) 5-6.25 MG tablet Take 1 tablet by mouth daily.     carbidopa-levodopa (SINEMET IR) 25-100 MG tablet 2 AT 8AM, 2 AT NOON, 1 AT 4PM, AND 1 AT 8PM MAY TAKE EXTRA AS NEEDED 600 tablet 0   cephALEXin (KEFLEX) 500 MG capsule Take 2,000 mg (4 capsules) one hour to all dental visits. 8 capsule 11   cholecalciferol (VITAMIN D) 25 MCG (1000 UNIT) tablet Take 1,000 Units by mouth daily.     Collagen-Boron-Hyaluronic Acid ([facility] TRIPLE ACTION PO) Take 1 tablet by mouth daily. Triple Action Joint Health     Cyanocobalamin (VITAMIN B-12) 5000 MCG TBDP Take 5,000 mcg by mouth daily.     docusate sodium (COLACE) 100 MG capsule Take 200 mg by mouth daily as needed (constipation.).      esomeprazole (NEXIUM) 20 MG capsule Take 20 mg by mouth daily.      furosemide (LASIX) 80 MG tablet Take Lasix 40 mg daily with an additional 40 mg as needed daily. 90 tablet 3   levothyroxine (SYNTHROID, LEVOTHROID) 75 MCG tablet Take 75 mcg by mouth daily before breakfast.      Menthol-Methyl Salicylate (SALONPAS PAIN RELIEF PATCH) PTCH Place 1 patch onto the skin daily as needed (pain.).     Multiple Vitamins-Minerals (PRESERVISION AREDS PO) Take 1 tablet by mouth daily.     Omega-3 Fatty Acids (FISH OIL) 1200 MG CAPS Take 1,200 mg by mouth daily.     oxyCODONE-acetaminophen (PERCOCET/ROXICET) 5-325 MG tablet Take 0.5-1 tablets by mouth every 6 (six) hours as needed for severe pain. 6 tablet 0   Polyethyl Glycol-Propyl Glycol (SYSTANE OP) Place 1 drop into both eyes in the morning and at bedtime.     potassium chloride SA (KLOR-CON M) 20 MEQ tablet Take 2 tablets (40 mEq total) by mouth daily. 180 tablet 1   No current facility-administered medications for this visit.     Past Medical History:  Diagnosis Date   Atrial fibrillation, persistent (HCC)    Dyspnea    with head low, sleep apnea   Dysrhythmia    afib   GERD (gastroesophageal reflux disease)    H/O hiatal hernia    Hematuria    History of kidney stones    HOH (hard of hearing)    sightly, bilaterally  Hyperlipidemia    Hypertension    Hypothyroidism    Parkinson disease    S/P TAVR (transcatheter aortic valve replacement) 08/03/2019   s/p TAVR with 26 mm Edwards S3U via the TF approach by Drs Aundra Dubin & Cornelius Moras   Severe aortic stenosis    Sleep apnea     Past Surgical History:  Procedure Laterality Date   ABDOMINAL HYSTERECTOMY     lso   CARDIOVERSION N/A 11/30/2019   Procedure: CARDIOVERSION;  Surgeon: Lewayne Bunting, MD;  Location: [facility]COPY;  Service: Cardiovascular;  Laterality: N/A;   CYSTOSCOPY W/ RETROGRADES  02/28/2012   Procedure: CYSTOSCOPY WITH RETROGRADE PYELOGRAM;  Surgeon: Sebastian Ache, MD;   Location: [facility];  Service: Urology;  Laterality: Right;   CYSTOSCOPY WITH STENT PLACEMENT  02/28/2012   Procedure: CYSTOSCOPY WITH STENT PLACEMENT;  Surgeon: Sebastian Ache, MD;  Location: [facility];  Service: Urology;  Laterality: Left;   CYSTOSCOPY/RETROGRADE/URETEROSCOPY/STONE EXTRACTION WITH BASKET  02/28/2012   Procedure: CYSTOSCOPY/RETROGRADE/URETEROSCOPY/STONE EXTRACTION WITH BASKET;  Surgeon: Sebastian Ache, MD;  Location: [facility];  Service: Urology;  Laterality: Left;   HOLMIUM LASER APPLICATION  02/28/2012   Procedure: HOLMIUM LASER APPLICATION;  Surgeon: Sebastian Ache, MD;  Location: [facility];  Service: Urology;  Laterality: Left;   INTRAOPERATIVE TRANSTHORACIC ECHOCARDIOGRAM  08/03/2019   Procedure: Intraoperative Transthoracic Echocardiogram;  Surgeon: Kathleene Hazel, MD;  Location: [facility] OR;  Service: Open Heart Surgery;;   KNEE ARTHROSCOPY     right   REVERSE SHOULDER ARTHROPLASTY Left 07/22/2018   Procedure: REVERSE SHOULDER ARTHROPLASTY;  Surgeon: Bjorn Pippin, MD;  Location: [facility] ORS;  Service: Orthopedics;  Laterality: Left;   RIGHT/LEFT HEART CATH AND CORONARY ANGIOGRAPHY N/A 07/05/2019   Procedure: RIGHT/LEFT HEART CATH AND CORONARY ANGIOGRAPHY;  Surgeon: Kathleene Hazel, MD;  Location: [facility] INVASIVE CV LAB;  Service: Cardiovascular;  Laterality: N/A;   TONSILLECTOMY     TRANSCATHETER AORTIC VALVE REPLACEMENT, TRANSFEMORAL N/A 08/03/2019   Procedure: TRANSCATHETER AORTIC VALVE REPLACEMENT, TRANSFEMORAL;  Surgeon: Kathleene Hazel, MD;  Location: [facility] OR;  Service: Open Heart Surgery;  Laterality: N/A;   VEIN LIGATION      Social History   Socioeconomic History   Marital status: Widowed    Spouse name: Not on file   Number of children: 3   Years of education: Not on file   Highest education level: Associate degree: occupational, Scientist, product/process development, or vocational program  Occupational History    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  Tobacco Use   Smoking status: Never   Smokeless tobacco: Never  Vaping Use   Vaping Use: Never used  Substance and Sexual Activity   Alcohol use: Never   Drug use: Never   Sexual activity: Not on file  Other Topics Concern   Not on file  Social History Narrative   Right handed   Lives alone   One story home one step up to home   Caffeine 2 cups   Social Determinants of Health   Financial Resource Strain: Low Risk  (10/18/2021)   Overall Financial Resource Strain (CARDIA)    Difficulty of Paying Living Expenses: Not hard at all  Food Insecurity: No Food Insecurity (10/18/2021)   Hunger Vital Sign    Worried About Running Out of Food in the Last Year: Never true    Ran Out of Food in the Last Year: Never true  Transportation Needs: No Transportation Needs (10/18/2021)   PRAPARE - Transportation  Lack of Transportation (Medical): No    Lack of Transportation (Non-Medical): No  Physical Activity: Not on file  Stress: Not on file  Social Connections: Not on file  Intimate Partner Violence: Not on file    Family History  Problem Relation Age of Onset   Heart failure Mother    Heart failure Father    Pneumonia Father    Healthy Sister    Healthy Son     ROS: no fevers or chills, productive cough, hemoptysis, dysphasia, odynophagia, melena, hematochezia, dysuria, hematuria, rash, seizure activity, orthopnea, PND, claudication. Remaining systems are negative.  Physical Exam: Well-developed well-nourished in no acute distress.  Skin is warm and dry.  HEENT is normal.  Neck is supple.  Chest is clear to auscultation with normal expansion.  Cardiovascular exam is regular rate and rhythm.  Abdominal exam nontender or distended. No masses palpated. Extremities show 2+ edema; chronic skin changes. neuro grossly intact   A/P  1 history of TAVR-most recent echocardiogram showed normally functioning valve.  Continue SBE  prophylaxis.  2 chronic diastolic congestive heart failure-chronic lower extremity edema.  I recommended increasing Lasix to 80 mg daily but she preferred to continue 40 mg daily due to frequent urination and "bone pain".  Will check potassium and renal function.  3 permanent atrial fibrillation-continue beta-blocker and apixaban at present dose.  4 history of syncope-patient was scheduled to wear a monitor but decided against.  No recurrences.  Will follow.  5 hypertension-blood pressure controlled.  Continue present medical regimen.  6 morbid obesity-we discussed importance of weight loss.  7 chronic dyspnea-felt to be multifactorial including obesity hypoventilation syndrome, deconditioning, sleep apnea and CHF.  [name], MD

## 2022-06-20 DIAGNOSIS — G20B2 Parkinson's disease with dyskinesia, with fluctuations: Secondary | ICD-10-CM | POA: Diagnosis not present

## 2022-06-20 DIAGNOSIS — M6259 Muscle wasting and atrophy, not elsewhere classified, multiple sites: Secondary | ICD-10-CM | POA: Diagnosis not present

## 2022-06-20 DIAGNOSIS — R2689 Other abnormalities of gait and mobility: Secondary | ICD-10-CM | POA: Diagnosis not present

## 2022-06-20 DIAGNOSIS — R278 Other lack of coordination: Secondary | ICD-10-CM | POA: Diagnosis not present

## 2022-06-24 DIAGNOSIS — I482 Chronic atrial fibrillation, unspecified: Secondary | ICD-10-CM | POA: Diagnosis not present

## 2022-06-24 DIAGNOSIS — I110 Hypertensive heart disease with heart failure: Secondary | ICD-10-CM | POA: Diagnosis not present

## 2022-06-25 ENCOUNTER — Encounter: Payer: Self-pay | Admitting: Cardiology

## 2022-06-25 ENCOUNTER — Ambulatory Visit: Payer: Medicare PPO | Attending: Cardiology | Admitting: Cardiology

## 2022-06-25 VITALS — BP 126/78 | HR 93 | Ht 65.0 in | Wt 302.0 lb

## 2022-06-25 DIAGNOSIS — R55 Syncope and collapse: Secondary | ICD-10-CM

## 2022-06-25 DIAGNOSIS — Z952 Presence of prosthetic heart valve: Secondary | ICD-10-CM

## 2022-06-25 DIAGNOSIS — M6259 Muscle wasting and atrophy, not elsewhere classified, multiple sites: Secondary | ICD-10-CM | POA: Diagnosis not present

## 2022-06-25 DIAGNOSIS — I4821 Permanent atrial fibrillation: Secondary | ICD-10-CM

## 2022-06-25 DIAGNOSIS — R600 Localized edema: Secondary | ICD-10-CM

## 2022-06-25 DIAGNOSIS — I5032 Chronic diastolic (congestive) heart failure: Secondary | ICD-10-CM | POA: Diagnosis not present

## 2022-06-25 DIAGNOSIS — I110 Hypertensive heart disease with heart failure: Secondary | ICD-10-CM | POA: Diagnosis not present

## 2022-06-25 DIAGNOSIS — G20B2 Parkinson's disease with dyskinesia, with fluctuations: Secondary | ICD-10-CM | POA: Diagnosis not present

## 2022-06-25 DIAGNOSIS — I10 Essential (primary) hypertension: Secondary | ICD-10-CM | POA: Diagnosis not present

## 2022-06-25 DIAGNOSIS — G20C Parkinsonism, unspecified: Secondary | ICD-10-CM | POA: Diagnosis not present

## 2022-06-25 DIAGNOSIS — R278 Other lack of coordination: Secondary | ICD-10-CM | POA: Diagnosis not present

## 2022-06-25 DIAGNOSIS — R2689 Other abnormalities of gait and mobility: Secondary | ICD-10-CM | POA: Diagnosis not present

## 2022-06-25 DIAGNOSIS — I480 Paroxysmal atrial fibrillation: Secondary | ICD-10-CM | POA: Diagnosis not present

## 2022-06-25 NOTE — Patient Instructions (Signed)
    Follow-Up: At [facility], you and your health needs are our priority.  As part of our continuing mission to provide you with exceptional heart care, we have created designated Provider Care Teams.  These Care Teams include your primary Cardiologist (physician) and Advanced Practice Providers (APPs -  Physician Assistants and Nurse Practitioners) who all work together to provide you with the care you need, when you need it.  We recommend signing up for the patient portal called "MyChart".  Sign up information is provided on this After Visit Summary.  MyChart is used to connect with patients for Virtual Visits (Telemedicine).  Patients are able to view lab/test results, encounter notes, upcoming appointments, etc.  Non-urgent messages can be sent to your provider as well.   To learn more about what you can do with MyChart, go to [email or web address].    Your next appointment:   6 month(s)  Provider:   Brian Crenshaw, MD      

## 2022-07-18 ENCOUNTER — Encounter: Payer: Self-pay | Admitting: *Deleted

## 2022-07-18 LAB — LAB REPORT - SCANNED: EGFR: 61.3

## 2022-08-01 ENCOUNTER — Ambulatory Visit: Payer: Medicare PPO | Admitting: Neurology

## 2022-08-16 ENCOUNTER — Telehealth: Payer: Self-pay | Admitting: Neurology

## 2022-08-16 NOTE — Telephone Encounter (Signed)
Lmom to switch appt to a virtual visit

## 2022-08-16 NOTE — Telephone Encounter (Signed)
Patient has appointment on 08/20/2022 at 3:30. Patient is in assisted living and the transportation bus is full and Patient's sister is wanting to know if the appointment could be virtual? /KB

## 2022-08-19 NOTE — Progress Notes (Signed)
Virtual Visit Via Video       Consent was obtained for video visit:  Yes.   Answered questions that patient had about telehealth interaction:  Yes.   I discussed the limitations, risks, security and privacy concerns of performing an evaluation and management service by telemedicine. I also discussed with the patient that there may be a patient responsible charge related to this service. The patient expressed understanding and agreed to proceed.  Pt location: Home Physician Location: office Name of referring provider:  Mila Palmer, MD I connected with Cindy Richmond at patients initiation/request on 01/29/2022 at  2:30 PM EST by video enabled telemedicine application and verified that I am speaking with the correct person using two identifiers. Pt MRN:  [id number] Pt DOB:  28-Feb-1950 Video Participants:  Cindy Richmond;    Assessment/Plan:   1.  Parkinsons Disease   -Continue carbidopa/levodopa 25/100, 2/2/1/1  2.  Urinary frequency  -Has seen urology in the past  3.  Atrial fibrillation  -Failed cardioversion attempt in the past.  -On Eliquis  4.  Dyspnea  -Cardiology following.  Felt multifactorial due to obesity hypoventilation syndrome, sleep apnea and deconditioning.  5.  Right leg pain  -Suspect a Baker's cyst as she is pointing into the popliteal fossa.  She asked me about this today and last visit.  Discussed again today to f/u with pcp.  The leg is not swollen.  It is not erythematous.  She is already on Eliquis.    6.  EDS  -Does not wear her CPAP and this is likely the source of the eds  7.  Did discuss with the patient that I would like to see her in person next time as it has been over a year already since I have seen her in person.  Subjective:   Cindy Richmond was seen today in follow up for Parkinsons disease.  My previous records were reviewed prior to todays visit as well as outside records available to me.  She is with her sister who supplements the history.   Prescriptions last visit recommended for wheelchair and lift chair.  Patient had planned to move to independent assisted living.  She reports today that ***she has done that.  Current prescribed movement disorder medications: carbidopa/levodopa 25/100, 2/2/1/1 Carbidopa/levodopa 50/200 at bedtime   ALLERGIES:   Allergies  Allergen Reactions   Nickel     Nickel earrings, ear holes get infected   Penicillins Other (See Comments)    unknown Did it involve swelling of the face/tongue/throat, SOB, or low BP? Unknown Did it involve sudden or severe rash/hives, skin peeling, or any reaction on the inside of your mouth or nose? Unknown Did you need to seek medical attention at a hospital or doctor's office? Unknown When did it last happen?      newborn allergy If all above answers are "NO", may proceed with cephalosporin use.     CURRENT MEDICATIONS:  Outpatient Encounter Medications as of 01/29/2022  Medication Sig   AMBULATORY NON FORMULARY MEDICATION Transport chair Dx: G20   apixaban (ELIQUIS) 5 MG TABS tablet TAKE 1 TABLET BY MOUTH TWICE A DAY   bisoprolol-hydrochlorothiazide (ZIAC) 5-6.25 MG tablet Take 1 tablet by mouth daily.   carbidopa-levodopa (SINEMET IR) 25-100 MG tablet 2 AT 8AM, 2 AT NOON, 1 AT 4PM, AND 1 AT 8PM MAY TAKE EXTRA AS NEEDED   cephALEXin (KEFLEX) 500 MG capsule Take 2,000 mg (4 capsules) one hour to all dental visits.  cholecalciferol (VITAMIN D) 25 MCG (1000 UNIT) tablet Take 1,000 Units by mouth daily.   Collagen-Boron-Hyaluronic Acid ([facility] [facility] TRIPLE ACTION PO) Take 1 tablet by mouth daily. Triple Action Joint Health   Cyanocobalamin (VITAMIN B-12) 5000 MCG TBDP Take 5,000 mcg by mouth daily.   docusate sodium (COLACE) 100 MG capsule Take 200 mg by mouth daily as needed (constipation.).   esomeprazole (NEXIUM) 20 MG capsule Take 20 mg by mouth daily.    levothyroxine (SYNTHROID, LEVOTHROID) 75 MCG tablet Take 75 mcg by mouth daily before breakfast.     Menthol-Methyl Salicylate (SALONPAS PAIN RELIEF PATCH) PTCH Place 1 patch onto the skin daily as needed (pain.).   Multiple Vitamins-Minerals (PRESERVISION AREDS PO) Take 1 tablet by mouth daily.   Omega-3 Fatty Acids (FISH OIL) 1200 MG CAPS Take 1,200 mg by mouth daily.   Polyethyl Glycol-Propyl Glycol (SYSTANE OP) Place 1 drop into both eyes in the morning and at bedtime.   No facility-administered encounter medications on file as of 01/29/2022.    Objective:   PHYSICAL EXAMINATION:    VITALS:   There were no vitals filed for this visit.  GEN:  The patient appears stated age and is in NAD. HEENT:  Normocephalic, atraumatic.    Neurological examination:  Orientation: The patient is alert and oriented x3. Cranial nerves: There is good facial symmetry with mild facial hypomimia. The speech is fluent and clear. Hearing is intact to conversational tone.  Motor: Strength is at least antigravity x2 (cannot see feet on video today)  Movement examination: Abnormal movements: No dyskinesia today. Coordination:  There is mild decremation with RAM's, with any form of RAMS, including alternating supination and pronation of the forearm, hand opening and closing, finger taps, on the right.  This is similar to previous   I have reviewed and interpreted the following labs independently    Chemistry      Component Value Date/Time   NA 142 05/10/2021 1419   NA 143 11/26/2019 1059   K 3.9 05/10/2021 1419   CL 103 05/10/2021 1419   CO2 31 05/10/2021 1419   BUN 18 05/10/2021 1419   BUN 14 11/26/2019 1059   CREATININE 0.79 05/10/2021 1419      Component Value Date/Time   CALCIUM 9.3 05/10/2021 1419   ALKPHOS 71 07/30/2019 0934   AST 14 (L) 07/30/2019 0934   ALT 7 07/30/2019 0934   BILITOT 0.8 07/30/2019 0934       Lab Results  Component Value Date   WBC 7.1 05/10/2021   HGB 13.7 05/10/2021   HCT 42.5 05/10/2021   MCV 83.5 05/10/2021   PLT 111.0 (L) 05/10/2021    Lab  Results  Component Value Date   TSH 4.21 10/06/2006   Follow up Instructions      -I discussed the assessment and treatment plan with the patient. The patient was provided an opportunity to ask questions and all were answered. The patient agreed with the plan and demonstrated an understanding of the instructions.   The patient was advised to call back or seek an in-person evaluation if the symptoms worsen or if the condition fails to improve as anticipated.    Total time spent on today's visit was ***minutes, including both face-to-face time and nonface-to-face time.  Time included that spent on review of records (prior notes available to me/labs/imaging if pertinent), discussing treatment and goals, answering patient's questions and coordinating care.   Kerin Salen, DO   Cc:  Mila Palmer, MD

## 2022-08-20 ENCOUNTER — Telehealth (INDEPENDENT_AMBULATORY_CARE_PROVIDER_SITE_OTHER): Payer: Medicare PPO | Admitting: Neurology

## 2022-08-20 ENCOUNTER — Encounter: Payer: Self-pay | Admitting: Neurology

## 2022-08-20 DIAGNOSIS — G4733 Obstructive sleep apnea (adult) (pediatric): Secondary | ICD-10-CM

## 2022-08-20 DIAGNOSIS — G20A1 Parkinson's disease without dyskinesia, without mention of fluctuations: Secondary | ICD-10-CM

## 2022-09-23 ENCOUNTER — Other Ambulatory Visit: Payer: Self-pay

## 2022-09-23 ENCOUNTER — Inpatient Hospital Stay (HOSPITAL_COMMUNITY)
Admission: EM | Admit: 2022-09-23 | Discharge: 2022-10-03 | DRG: 871 | Disposition: A | Discharge status: Skilled Nursing Facility | Payer: Medicare PPO | Attending: Internal Medicine | Admitting: Internal Medicine

## 2022-09-23 ENCOUNTER — Encounter (HOSPITAL_COMMUNITY): Payer: Self-pay

## 2022-09-23 ENCOUNTER — Emergency Department (HOSPITAL_COMMUNITY): Payer: Medicare PPO

## 2022-09-23 DIAGNOSIS — Z1152 Encounter for screening for COVID-19: Secondary | ICD-10-CM

## 2022-09-23 DIAGNOSIS — Z952 Presence of prosthetic heart valve: Secondary | ICD-10-CM | POA: Diagnosis not present

## 2022-09-23 DIAGNOSIS — B372 Candidiasis of skin and nail: Secondary | ICD-10-CM | POA: Diagnosis present

## 2022-09-23 DIAGNOSIS — L97819 Non-pressure chronic ulcer of other part of right lower leg with unspecified severity: Secondary | ICD-10-CM | POA: Diagnosis present

## 2022-09-23 DIAGNOSIS — L97829 Non-pressure chronic ulcer of other part of left lower leg with unspecified severity: Secondary | ICD-10-CM | POA: Diagnosis present

## 2022-09-23 DIAGNOSIS — G4733 Obstructive sleep apnea (adult) (pediatric): Secondary | ICD-10-CM | POA: Diagnosis present

## 2022-09-23 DIAGNOSIS — Z79899 Other long term (current) drug therapy: Secondary | ICD-10-CM

## 2022-09-23 DIAGNOSIS — K219 Gastro-esophageal reflux disease without esophagitis: Secondary | ICD-10-CM | POA: Diagnosis present

## 2022-09-23 DIAGNOSIS — Z91048 Other nonmedicinal substance allergy status: Secondary | ICD-10-CM

## 2022-09-23 DIAGNOSIS — I5033 Acute on chronic diastolic (congestive) heart failure: Secondary | ICD-10-CM | POA: Diagnosis present

## 2022-09-23 DIAGNOSIS — I110 Hypertensive heart disease with heart failure: Secondary | ICD-10-CM | POA: Diagnosis present

## 2022-09-23 DIAGNOSIS — L0390 Cellulitis, unspecified: Secondary | ICD-10-CM | POA: Diagnosis not present

## 2022-09-23 DIAGNOSIS — L03116 Cellulitis of left lower limb: Secondary | ICD-10-CM | POA: Diagnosis not present

## 2022-09-23 DIAGNOSIS — Z751 Person awaiting admission to adequate facility elsewhere: Secondary | ICD-10-CM

## 2022-09-23 DIAGNOSIS — L03115 Cellulitis of right lower limb: Secondary | ICD-10-CM | POA: Diagnosis present

## 2022-09-23 DIAGNOSIS — I5023 Acute on chronic systolic (congestive) heart failure: Secondary | ICD-10-CM | POA: Diagnosis not present

## 2022-09-23 DIAGNOSIS — D72829 Elevated white blood cell count, unspecified: Secondary | ICD-10-CM | POA: Diagnosis not present

## 2022-09-23 DIAGNOSIS — R197 Diarrhea, unspecified: Secondary | ICD-10-CM | POA: Diagnosis not present

## 2022-09-23 DIAGNOSIS — Z7901 Long term (current) use of anticoagulants: Secondary | ICD-10-CM

## 2022-09-23 DIAGNOSIS — Z6841 Body Mass Index (BMI) 40.0 and over, adult: Secondary | ICD-10-CM

## 2022-09-23 DIAGNOSIS — Z87442 Personal history of urinary calculi: Secondary | ICD-10-CM

## 2022-09-23 DIAGNOSIS — I10 Essential (primary) hypertension: Secondary | ICD-10-CM | POA: Diagnosis present

## 2022-09-23 DIAGNOSIS — I83019 Varicose veins of right lower extremity with ulcer of unspecified site: Secondary | ICD-10-CM | POA: Diagnosis present

## 2022-09-23 DIAGNOSIS — Z79890 Hormone replacement therapy (postmenopausal): Secondary | ICD-10-CM

## 2022-09-23 DIAGNOSIS — Z90710 Acquired absence of both cervix and uterus: Secondary | ICD-10-CM

## 2022-09-23 DIAGNOSIS — I4811 Longstanding persistent atrial fibrillation: Secondary | ICD-10-CM | POA: Diagnosis not present

## 2022-09-23 DIAGNOSIS — R6510 Systemic inflammatory response syndrome (SIRS) of non-infectious origin without acute organ dysfunction: Secondary | ICD-10-CM | POA: Diagnosis not present

## 2022-09-23 DIAGNOSIS — G20A1 Parkinson's disease without dyskinesia, without mention of fluctuations: Secondary | ICD-10-CM | POA: Diagnosis present

## 2022-09-23 DIAGNOSIS — E039 Hypothyroidism, unspecified: Secondary | ICD-10-CM | POA: Diagnosis present

## 2022-09-23 DIAGNOSIS — I83028 Varicose veins of left lower extremity with ulcer other part of lower leg: Secondary | ICD-10-CM | POA: Diagnosis present

## 2022-09-23 DIAGNOSIS — I4819 Other persistent atrial fibrillation: Secondary | ICD-10-CM | POA: Diagnosis present

## 2022-09-23 DIAGNOSIS — I350 Nonrheumatic aortic (valve) stenosis: Secondary | ICD-10-CM

## 2022-09-23 DIAGNOSIS — I83018 Varicose veins of right lower extremity with ulcer other part of lower leg: Secondary | ICD-10-CM | POA: Diagnosis present

## 2022-09-23 DIAGNOSIS — E785 Hyperlipidemia, unspecified: Secondary | ICD-10-CM | POA: Diagnosis present

## 2022-09-23 DIAGNOSIS — A419 Sepsis, unspecified organism: Secondary | ICD-10-CM | POA: Diagnosis not present

## 2022-09-23 DIAGNOSIS — I872 Venous insufficiency (chronic) (peripheral): Secondary | ICD-10-CM | POA: Diagnosis present

## 2022-09-23 DIAGNOSIS — Z880 Allergy status to penicillin: Secondary | ICD-10-CM

## 2022-09-23 DIAGNOSIS — E669 Obesity, unspecified: Secondary | ICD-10-CM | POA: Diagnosis not present

## 2022-09-23 DIAGNOSIS — Z96612 Presence of left artificial shoulder joint: Secondary | ICD-10-CM | POA: Diagnosis present

## 2022-09-23 DIAGNOSIS — Z8249 Family history of ischemic heart disease and other diseases of the circulatory system: Secondary | ICD-10-CM

## 2022-09-23 LAB — CBC WITH DIFFERENTIAL/PLATELET
Abs Immature Granulocytes: 0.09 10*3/uL — ABNORMAL HIGH (ref 0.00–0.07)
Basophils Absolute: 0.1 10*3/uL (ref 0.0–0.1)
Basophils Relative: 0 %
Eosinophils Absolute: 0.1 10*3/uL (ref 0.0–0.5)
Eosinophils Relative: 1 %
HCT: 39.1 % (ref 36.0–46.0)
Hemoglobin: 11.2 g/dL — ABNORMAL LOW (ref 12.0–15.0)
Immature Granulocytes: 1 %
Lymphocytes Relative: 9 %
Lymphs Abs: 1 10*3/uL (ref 0.7–4.0)
MCH: 22.5 pg — ABNORMAL LOW (ref 26.0–34.0)
MCHC: 28.6 g/dL — ABNORMAL LOW (ref 30.0–36.0)
MCV: 78.5 fL — ABNORMAL LOW (ref 80.0–100.0)
Monocytes Absolute: 1.3 10*3/uL — ABNORMAL HIGH (ref 0.1–1.0)
Monocytes Relative: 11 %
Neutro Abs: 9.2 10*3/uL — ABNORMAL HIGH (ref 1.7–7.7)
Neutrophils Relative %: 78 %
Platelets: 183 10*3/uL (ref 150–400)
RBC: 4.98 MIL/uL (ref 3.87–5.11)
RDW: 20.6 % — ABNORMAL HIGH (ref 11.5–15.5)
WBC: 11.7 10*3/uL — ABNORMAL HIGH (ref 4.0–10.5)
nRBC: 0 % (ref 0.0–0.2)

## 2022-09-23 LAB — RESP PANEL BY RT-PCR (RSV, FLU A&B, COVID)  RVPGX2
Influenza A by PCR: NEGATIVE
Influenza B by PCR: NEGATIVE
Resp Syncytial Virus by PCR: NEGATIVE
SARS Coronavirus 2 by RT PCR: NEGATIVE

## 2022-09-23 LAB — CULTURE, BLOOD (ROUTINE X 2): Special Requests: ADEQUATE

## 2022-09-23 LAB — COMPREHENSIVE METABOLIC PANEL
ALT: 8 U/L (ref 0–44)
AST: 14 U/L — ABNORMAL LOW (ref 15–41)
Albumin: 3.4 g/dL — ABNORMAL LOW (ref 3.5–5.0)
Alkaline Phosphatase: 95 U/L (ref 38–126)
Anion gap: 12 (ref 5–15)
BUN: 21 mg/dL (ref 8–23)
CO2: 29 mmol/L (ref 22–32)
Calcium: 9.1 mg/dL (ref 8.9–10.3)
Chloride: 97 mmol/L — ABNORMAL LOW (ref 98–111)
Creatinine, Ser: 0.86 mg/dL (ref 0.44–1.00)
GFR, Estimated: >60 mL/min (ref >60)
Glucose, Bld: 115 mg/dL — ABNORMAL HIGH (ref 70–99)
Potassium: 4.2 mmol/L (ref 3.5–5.1)
Sodium: 138 mmol/L (ref 135–145)
Total Bilirubin: 0.7 mg/dL (ref 0.3–1.2)
Total Protein: 7.1 g/dL (ref 6.5–8.1)

## 2022-09-23 LAB — URINALYSIS, ROUTINE W REFLEX MICROSCOPIC
Bilirubin Urine: NEGATIVE
Glucose, UA: NEGATIVE mg/dL
Hgb urine dipstick: NEGATIVE
Ketones, ur: NEGATIVE mg/dL
Nitrite: POSITIVE — AB
Protein, ur: NEGATIVE mg/dL
Specific Gravity, Urine: 1.009 (ref 1.005–1.030)
pH: 6 (ref 5.0–8.0)

## 2022-09-23 LAB — I-STAT CG4 LACTIC ACID, ED: Lactic Acid, Venous: 1.5 mmol/L (ref 0.5–1.9)

## 2022-09-23 LAB — PROTIME-INR
INR: 1.5 — ABNORMAL HIGH (ref 0.8–1.2)
Prothrombin Time: 18.2 seconds — ABNORMAL HIGH (ref 11.4–15.2)

## 2022-09-23 LAB — APTT: aPTT: 40 seconds — ABNORMAL HIGH (ref 24–36)

## 2022-09-23 MED ORDER — VANCOMYCIN HCL IN DEXTROSE 1-5 GM/200ML-% IV SOLN
1000.0000 mg | Freq: Once | INTRAVENOUS | Status: AC
  Administered 2022-09-23: 1000 mg via INTRAVENOUS

## 2022-09-23 MED ORDER — BISOPROLOL-HYDROCHLOROTHIAZIDE 5-6.25 MG PO TABS: 1.0000 | ORAL_TABLET | Freq: Every day | ORAL | Status: DC

## 2022-09-23 MED ORDER — CARBIDOPA-LEVODOPA 25-100 MG PO TABS
2.0000 | ORAL_TABLET | ORAL | Status: DC
  Administered 2022-09-24 – 2022-10-03 (×20): 2 via ORAL
  Filled 2022-09-23 (×19): qty 2

## 2022-09-23 MED ORDER — NYSTATIN 100000 UNIT/GM EX POWD
Freq: Once | CUTANEOUS | Status: AC
  Filled 2022-09-23: qty 15

## 2022-09-23 MED ORDER — IPRATROPIUM-ALBUTEROL 0.5-2.5 (3) MG/3ML IN SOLN: 3.0000 mL | Freq: Four times a day (QID) | RESPIRATORY_TRACT | Status: DC | PRN

## 2022-09-23 MED ORDER — LACTATED RINGERS IV BOLUS (SEPSIS)
1000.0000 mL | Freq: Once | INTRAVENOUS | Status: AC
  Administered 2022-09-23: 1000 mL via INTRAVENOUS

## 2022-09-23 MED ORDER — ONDANSETRON HCL 4 MG PO TABS: 4.0000 mg | ORAL_TABLET | Freq: Four times a day (QID) | ORAL | Status: DC | PRN

## 2022-09-23 MED ORDER — VANCOMYCIN HCL IN DEXTROSE 1-5 GM/200ML-% IV SOLN: 1000.0000 mg | Freq: Once | INTRAVENOUS | Status: DC

## 2022-09-23 MED ORDER — LEVOTHYROXINE SODIUM 75 MCG PO TABS
75.0000 ug | ORAL_TABLET | Freq: Every day | ORAL | Status: DC
  Administered 2022-09-24 – 2022-10-03 (×10): 75 ug via ORAL
  Filled 2022-09-23 (×10): qty 1

## 2022-09-23 MED ORDER — PROSIGHT PO TABS
1.0000 | ORAL_TABLET | Freq: Two times a day (BID) | ORAL | Status: DC
  Administered 2022-09-23 – 2022-10-03 (×20): 1 via ORAL
  Filled 2022-09-23 (×21): qty 1

## 2022-09-23 MED ORDER — SODIUM CHLORIDE 0.9 % IV SOLN
2.0000 g | Freq: Once | INTRAVENOUS | Status: AC
  Administered 2022-09-23: 2 g via INTRAVENOUS
  Filled 2022-09-23: qty 12.5

## 2022-09-23 MED ORDER — LACTATED RINGERS IV SOLN: INTRAVENOUS | Status: DC

## 2022-09-23 MED ORDER — SODIUM CHLORIDE 0.9 % IV SOLN
2.0000 g | Freq: Once | INTRAVENOUS | Status: DC
  Filled 2022-09-23: qty 10

## 2022-09-23 MED ORDER — VANCOMYCIN HCL IN DEXTROSE 1-5 GM/200ML-% IV SOLN
1000.0000 mg | Freq: Once | INTRAVENOUS | Status: DC
  Administered 2022-09-23: 1000 mg via INTRAVENOUS
  Filled 2022-09-23: qty 200

## 2022-09-23 MED ORDER — BISOPROLOL FUMARATE 5 MG PO TABS
5.0000 mg | ORAL_TABLET | Freq: Every day | ORAL | Status: DC
  Administered 2022-09-24 – 2022-10-03 (×10): 5 mg via ORAL
  Filled 2022-09-23 (×10): qty 1

## 2022-09-23 MED ORDER — IPRATROPIUM-ALBUTEROL 0.5-2.5 (3) MG/3ML IN SOLN
3.0000 mL | Freq: Once | RESPIRATORY_TRACT | Status: AC
  Administered 2022-09-23: 3 mL via RESPIRATORY_TRACT
  Filled 2022-09-23: qty 3

## 2022-09-23 MED ORDER — VITAMIN C 500 MG PO TABS
250.0000 mg | ORAL_TABLET | Freq: Every day | ORAL | Status: DC
  Administered 2022-09-23 – 2022-10-02 (×10): 250 mg via ORAL
  Filled 2022-09-23 (×10): qty 1

## 2022-09-23 MED ORDER — VANCOMYCIN HCL 2000 MG/400ML IV SOLN
2000.0000 mg | Freq: Once | INTRAVENOUS | Status: DC
  Filled 2022-09-23: qty 400

## 2022-09-23 MED ORDER — PANTOPRAZOLE SODIUM 40 MG PO TBEC
40.0000 mg | DELAYED_RELEASE_TABLET | Freq: Every day | ORAL | Status: DC
  Administered 2022-09-24 – 2022-10-03 (×10): 40 mg via ORAL
  Filled 2022-09-23 (×10): qty 1

## 2022-09-23 MED ORDER — ACETAMINOPHEN 325 MG PO TABS: 650.0000 mg | ORAL_TABLET | Freq: Four times a day (QID) | ORAL | Status: DC | PRN

## 2022-09-23 MED ORDER — SODIUM CHLORIDE 0.9 % IV SOLN
1.0000 g | INTRAVENOUS | Status: DC
  Administered 2022-09-23: 1 g via INTRAVENOUS
  Filled 2022-09-23: qty 10

## 2022-09-23 MED ORDER — APIXABAN 5 MG PO TABS
5.0000 mg | ORAL_TABLET | Freq: Two times a day (BID) | ORAL | Status: DC
  Administered 2022-09-23 – 2022-10-03 (×20): 5 mg via ORAL
  Filled 2022-09-23 (×20): qty 1

## 2022-09-23 MED ORDER — ACETAMINOPHEN 650 MG RE SUPP: 650.0000 mg | Freq: Four times a day (QID) | RECTAL | Status: DC | PRN

## 2022-09-23 MED ORDER — POTASSIUM CHLORIDE CRYS ER 20 MEQ PO TBCR
40.0000 meq | EXTENDED_RELEASE_TABLET | Freq: Every day | ORAL | Status: DC
  Administered 2022-09-24 – 2022-10-03 (×10): 40 meq via ORAL
  Filled 2022-09-23 (×10): qty 2

## 2022-09-23 MED ORDER — HYDROCHLOROTHIAZIDE 12.5 MG PO TABS
6.2500 mg | ORAL_TABLET | Freq: Every day | ORAL | Status: DC
  Administered 2022-09-24 – 2022-09-27 (×4): 6.25 mg via ORAL
  Filled 2022-09-23 (×4): qty 1

## 2022-09-23 MED ORDER — ACETAMINOPHEN 325 MG PO TABS
650.0000 mg | ORAL_TABLET | Freq: Four times a day (QID) | ORAL | Status: DC | PRN
  Administered 2022-09-23 – 2022-09-29 (×5): 650 mg via ORAL
  Filled 2022-09-23 (×6): qty 2

## 2022-09-23 MED ORDER — VITAMIN B-12 1000 MCG PO TABS
1000.0000 ug | ORAL_TABLET | Freq: Every day | ORAL | Status: DC
  Administered 2022-09-23 – 2022-10-03 (×11): 1000 ug via ORAL
  Filled 2022-09-23 (×12): qty 1

## 2022-09-23 MED ORDER — VITAMIN B-12 5000 MCG PO TBDP: 5000.0000 ug | ORAL_TABLET | Freq: Every day | ORAL | Status: DC

## 2022-09-23 MED ORDER — FUROSEMIDE 10 MG/ML IJ SOLN
20.0000 mg | Freq: Once | INTRAMUSCULAR | Status: AC
  Administered 2022-09-23: 20 mg via INTRAVENOUS
  Filled 2022-09-23: qty 4

## 2022-09-23 MED ORDER — ONDANSETRON HCL 4 MG/2ML IJ SOLN: 4.0000 mg | Freq: Four times a day (QID) | INTRAMUSCULAR | Status: DC | PRN

## 2022-09-23 MED ORDER — CARBIDOPA-LEVODOPA 25-100 MG PO TABS
1.0000 | ORAL_TABLET | ORAL | Status: DC
  Administered 2022-09-23 – 2022-10-03 (×20): 1 via ORAL
  Filled 2022-09-23 (×21): qty 1

## 2022-09-23 MED ORDER — FUROSEMIDE 40 MG PO TABS
40.0000 mg | ORAL_TABLET | Freq: Every day | ORAL | Status: DC
  Administered 2022-09-24 – 2022-10-03 (×10): 40 mg via ORAL
  Filled 2022-09-23 (×5): qty 1
  Filled 2022-09-23: qty 2
  Filled 2022-09-23 (×4): qty 1

## 2022-09-23 NOTE — H&P (Signed)
History and Physical    Patient: Cindy Richmond:[id number] DOB: Apr 28, 1950 DOA: 09/23/2022 DOS: the patient was seen and examined on 09/23/2022 PCP: Mila Palmer, MD  Patient coming from: ALF/ILF  Chief Complaint:  Chief Complaint  Patient presents with   Wound Infection   HPI: Cindy Richmond is a 72 y.o. female with medical history significant for A-fib on chronic anticoagulation therapy, history of aortic valve stenosis status post TAVR, hypothyroidism, hypertension, GERD, morbid obesity (BMI 50.26), chronic venous stasis who presents to the ER for evaluation of pain and increased purulent drainage from bilateral lower extremity ulcers (Lt > Rt). Patient has bilateral lower extremity wounds and states that she has noted increased foul-smelling drainage from her wounds as well as increased pain.  She has had chills and undocumented fever at home.  She also complains of a rash under both breasts which is pruritic.  Patient complains of shortness of breath and wheezing but denied having any cough. She denies having any chest pain, no palpitations, no diaphoresis, no abdominal pain, no urinary symptoms, no changes in her bowel habits, no headache, no dizziness, no lightheadedness, no blurred vision, no focal deficit. Patient was noted to be tachycardic with heart rate of about 115 and tachypneic with respiratory rate of 25 Abnormal labs include a white count of 11.7 She received a 2 L IV fluid bolus in the ER and received vancomycin and cefepime she will be admitted to the hospital for further evaluation.    Review of Systems: As mentioned in the history of present illness. All other systems reviewed and are negative. Past Medical History:  Diagnosis Date   Atrial fibrillation, persistent (HCC)    Dyspnea    with head low, sleep apnea   Dysrhythmia    afib   GERD (gastroesophageal reflux disease)    H/O hiatal hernia    Hematuria    History of kidney stones    HOH (hard of hearing)     sightly, bilaterally   Hyperlipidemia    Hypertension    Hypothyroidism    Parkinson disease    S/P TAVR (transcatheter aortic valve replacement) 08/03/2019   s/p TAVR with 26 mm Edwards S3U via the TF approach by Drs Aundra Dubin & Cornelius Moras   Severe aortic stenosis    Sleep apnea    Past Surgical History:  Procedure Laterality Date   ABDOMINAL HYSTERECTOMY     lso   CARDIOVERSION N/A 11/30/2019   Procedure: CARDIOVERSION;  Surgeon: Lewayne Bunting, MD;  Location: [facility] ENDOSCOPY;  Service: Cardiovascular;  Laterality: N/A;   CYSTOSCOPY W/ RETROGRADES  02/28/2012   Procedure: CYSTOSCOPY WITH RETROGRADE PYELOGRAM;  Surgeon: Sebastian Ache, MD;  Location: [facility];  Service: Urology;  Laterality: Right;   CYSTOSCOPY WITH STENT PLACEMENT  02/28/2012   Procedure: CYSTOSCOPY WITH STENT PLACEMENT;  Surgeon: Sebastian Ache, MD;  Location: [facility];  Service: Urology;  Laterality: Left;   CYSTOSCOPY/RETROGRADE/URETEROSCOPY/STONE EXTRACTION WITH BASKET  02/28/2012   Procedure: CYSTOSCOPY/RETROGRADE/URETEROSCOPY/STONE EXTRACTION WITH BASKET;  Surgeon: Sebastian Ache, MD;  Location: [facility];  Service: Urology;  Laterality: Left;   HOLMIUM LASER APPLICATION  02/28/2012   Procedure: HOLMIUM LASER APPLICATION;  Surgeon: Sebastian Ache, MD;  Location: [facility];  Service: Urology;  Laterality: Left;   INTRAOPERATIVE TRANSTHORACIC ECHOCARDIOGRAM  08/03/2019   Procedure: Intraoperative Transthoracic Echocardiogram;  Surgeon: Kathleene Hazel, MD;  Location: [facility] OR;  Service: Open Heart Surgery;;   KNEE ARTHROSCOPY  right   REVERSE SHOULDER ARTHROPLASTY Left 07/22/2018   Procedure: REVERSE SHOULDER ARTHROPLASTY;  Surgeon: Bjorn Pippin, MD;  Location: [facility] ORS;  Service: Orthopedics;  Laterality: Left;   RIGHT/LEFT HEART CATH AND CORONARY ANGIOGRAPHY N/A 07/05/2019   Procedure: RIGHT/LEFT HEART CATH AND CORONARY ANGIOGRAPHY;  Surgeon:  Kathleene Hazel, MD;  Location: [facility];  Service: Cardiovascular;  Laterality: N/A;   TONSILLECTOMY     TRANSCATHETER AORTIC VALVE REPLACEMENT, TRANSFEMORAL N/A 08/03/2019   Procedure: TRANSCATHETER AORTIC VALVE REPLACEMENT, TRANSFEMORAL;  Surgeon: Kathleene Hazel, MD;  Location: [facility] OR;  Service: Open Heart Surgery;  Laterality: N/A;   VEIN LIGATION     Social History:  reports that she has never smoked. She has never used smokeless tobacco. She reports that she does not drink alcohol and does not use drugs.  Allergies  Allergen Reactions   Nickel Other (See Comments)    Nickel earrings, ear holes get infected   Penicillins Other (See Comments)    Allergy recorded when patient was a baby (exact allergic reaction not noted)     Family History  Problem Relation Age of Onset   Heart failure Mother    Heart failure Father    Pneumonia Father    Healthy Sister    Healthy Son     Prior to Admission medications   Medication Sig Start Date End Date Taking? Authorizing Provider  acetaminophen (TYLENOL) 325 MG tablet Take 650 mg by mouth every 6 (six) hours as needed for fever (or pain).   Yes [provider]  apixaban (ELIQUIS) 5 MG TABS tablet Take 1 tablet (5 mg total) by mouth 2 (two) times daily. 04/17/22  Yes Lewayne Bunting, MD  bisoprolol-hydrochlorothiazide [facility]) 5-6.25 MG tablet Take 1 tablet by mouth daily. 10/12/18  Yes [provider]  carbidopa-levodopa (SINEMET IR) 25-100 MG tablet 2 AT 8AM, 2 AT NOON, 1 AT 4PM, AND 1 AT 8PM MAY TAKE EXTRA AS NEEDED Patient taking differently: Take 1-2 tablets by mouth See admin instructions. Take 2 tablets by mouth at 8 AM and 12 NOON. Take 1 tablet by mouth at 4 PM and 8 PM. 02/26/22  Yes Tat, Octaviano Batty, DO  Cyanocobalamin (VITAMIN B-12) 5000 MCG TBDP Take 5,000 mcg by mouth daily.   Yes [provider]  esomeprazole (NEXIUM) 20 MG capsule Take 20 mg by mouth daily.    Yes [provider]  furosemide (LASIX) 40 MG tablet Take 40 mg by mouth in the morning.   Yes [provider]  levothyroxine (SYNTHROID, LEVOTHROID) 75 MCG tablet Take 75 mcg by mouth daily before breakfast.    Yes [provider]  Multiple Vitamins-Minerals (PRESERVISION AREDS PO) Take 1 capsule by mouth 2 (two) times daily.   Yes [provider]  nystatin cream (MYCOSTATIN) Apply 1 Application topically 3 (three) times daily as needed (for redness or rash- abdominal folds).   Yes [provider]  potassium chloride SA (KLOR-CON M) 20 MEQ tablet Take 2 tablets (40 mEq total) by mouth daily. Patient taking differently: Take 20 mEq by mouth daily. 05/01/22  Yes Marjie Skiff E, PA-C  vitamin C (ASCORBIC ACID) 250 MG tablet Take 250 mg by mouth at bedtime.   Yes [provider]  AMBULATORY NON FORMULARY MEDICATION Lift chair Dx:  G20.A1 01/29/22   Tat, Octaviano Batty, DO  AMBULATORY NON FORMULARY MEDICATION Wheelchair Dx: G20.A1 01/29/22   Tat, Octaviano Batty, DO  cephALEXin (KEFLEX) 500 MG capsule Take  2,000 mg (4 capsules) one hour to all dental visits. Patient not taking: Reported on 09/23/2022 08/11/19   Janetta Hora, PA-C  furosemide (LASIX) 80 MG tablet Take Lasix 40 mg daily with an additional 40 mg as needed daily. Patient not taking: Reported on 09/23/2022 06/07/22   Lewayne Bunting, MD  oxyCODONE-acetaminophen (PERCOCET/ROXICET) 5-325 MG tablet Take 0.5-1 tablets by mouth every 6 (six) hours as needed for severe pain. Patient not taking: Reported on 09/23/2022 04/29/22   Tilden Fossa, MD    Physical Exam: Vitals:   09/23/22 1430 09/23/22 1445 09/23/22 1500 09/23/22 1515  BP: (!) 144/88 127/84 111/66 113/82  Pulse: (!) 115 (!) 101 (!) 114 (!) 109  Resp: (!) 24 (!) 26 (!) 25 14  Temp:      TempSrc:      SpO2: 94% 96% 94% 93%  Weight:       Physical Exam Vitals and nursing note reviewed.  Constitutional:      Comments: Acutely ill-appearing   HENT:     Head: Normocephalic and atraumatic.     Nose: Nose normal.     Mouth/Throat:     Mouth: Mucous membranes are moist.  Eyes:     Comments: Pale conjunctiva  Cardiovascular:     Rate and Rhythm: Tachycardia present.  Pulmonary:     Breath sounds: Wheezing present.     Comments: Tachypneic Abdominal:     General: Bowel sounds are normal.     Palpations: Abdomen is soft.     Comments: Central adiposity  Musculoskeletal:     Cervical back: Normal range of motion and neck supple.     Right lower leg: Edema present.     Left lower leg: Edema present.  Skin:    Comments: Redness over both lower extremities, bilateral lower extremity wounds on the posterior tibia with foul-smelling purulent drainage  Neurological:     Mental Status: She is alert.     Motor: Weakness present.  Psychiatric:        Mood and Affect: Mood normal.        Behavior: Behavior normal.      Data Reviewe Relevant notes from primary care and specialist visits, past discharge summaries as available in EHR, including Care Everywhere. Prior diagnostic testing as pertinent to current admission diagnoses Updated medications and problem lists for reconciliation ED course, including vitals, labs, imaging, treatment and response to treatment Triage notes, nursing and pharmacy notes and ED provider's notes Notable results as noted in HPI Labs reviewed.  Lactic acid 1.5, sodium 138, potassium 4.2, chloride 97, bicarb 29, glucose 115, BUN 21, creatinine 0.86, calcium 9.1, total protein 7.1, albumin 3.4, AST 14, ALT 8, alkaline phosphatase 95, total bilirubin 0.7, white count 11.7, hemoglobin 11.2, hematocrit 39.1, MCV 78.5, platelet count 183 Twelve-lead EKG reviewed by me shows rapid A-fib There are no new results to review at this time.  Assessment and Plan: * Sepsis due to cellulitis (HCC) As evidenced by tachycardia, tachypnea, bilateral lower extremity cellulitis and infected wounds with purulent drainage  as well as leukocytosis. Place patient empirically on vancomycin and Rocephin Continue IV fluid resuscitation Consult wound care for bilateral lower extremity wounds with purulent drainage Follow-up results of blood cultures  Acute on chronic diastolic CHF (congestive heart failure) (HCC) Patient with complaints of shortness of breath and wheezing Chest x-ray shows mild interstitial edema and pulmonary vascular congestion Last 2D echocardiogram from 04/24 shows an LVEF of 55 to 60% Patient received 2 L  IV fluid bolus in the ER Will give a dose of Lasix 20 mg IV x 1 As needed bronchodilator therapy Continue diuretic therapy in a.m.  Hypothyroidism Continue Synthroid  Hypertension Continue bisoprolol/HCTZ  Atrial fibrillation, persistent (HCC) Continue apixaban as primary prophylaxis for an acute stroke Continue bisoprolol for rate control  Severe aortic stenosis Status post TAVR Stable  Obesity, morbid (HCC) BMI 50.26 Complicates overall prognosis and care Lifestyle modification and exercise has been discussed with patient      Advance Care Planning:   Code Status: Full Code   Consults: Wound care  Family Communication: Discussed plan of care with patient at the bedside.  She verbalizes understanding and agrees to the plan.  Severity of Illness: The appropriate patient status for this patient is INPATIENT. Inpatient status is judged to be reasonable and necessary in order to provide the required intensity of service to ensure the patient's safety. The patient's presenting symptoms, physical exam findings, and initial radiographic and laboratory data in the context of their chronic comorbidities is felt to place them at high risk for further clinical deterioration. Furthermore, it is not anticipated that the patient will be medically stable for discharge from the hospital within 2 midnights of admission.   * I certify that at the point of admission it is my clinical  judgment that the patient will require inpatient hospital care spanning beyond 2 midnights from the point of admission due to high intensity of service, high risk for further deterioration and high frequency of surveillance required.*  Author: Lucile Shutters, MD 09/23/2022 4:39 PM  For on call review [email or web address].

## 2022-09-23 NOTE — Sepsis Progress Note (Signed)
Elink monitoring for the code sepsis protocol.  

## 2022-09-23 NOTE — Progress Notes (Signed)
Pharmacy Antibiotic Note  Cindy Richmond is a 72 y.o. female admitted on 09/23/2022 with sepsis due to bilateral lower extremity cellulitis and infected wounds with purulent drainage.  Pharmacy has been consulted for vancomycin dosing.  In ED, IV vancomycin 1 g administered at 1414 and also at 1512 to equal a total dose of 2 g.  Plan: Continue vancomycin at 1500 mg IV every 24 hours (Goal AUC 400-550, eAUC 463.3, SCr used: 0.86, Vd: 0.5) Monitor clinical progress, renal function, vancomycin levels as indicated F/U C&S, abx deescalation / LOT   Weight: (!) 137 kg (302 lb)  Temp (24hrs), Avg:98.2 F (36.8 C), Min:97.9 F (36.6 C), Max:98.4 F (36.9 C)  Recent Labs  Lab 09/23/22 1310 09/23/22 1419  WBC 11.7*  --   CREATININE 0.86  --   LATICACIDVEN  --  1.5    Estimated Creatinine Clearance: 80.6 mL/min (by C-G formula based on SCr of 0.86 mg/dL).    Allergies  Allergen Reactions   Nickel Other (See Comments)    Nickel earrings, ear holes get infected   Penicillins Other (See Comments)    Allergy recorded when patient was a baby (exact allergic reaction not noted)     Antimicrobials this admission: 7/29 cefepime x 1 7/29 ceftriaxone >> 7/29 vancomycin >>  Dose adjustments this admission: N/A  Microbiology results: 7/29 BCx: sent   Thank you for allowing pharmacy to be a part of this patient's care.  Selinda Eon, PharmD, BCPS Clinical Pharmacist [place] 09/23/2022 4:49 PM

## 2022-09-23 NOTE — Assessment & Plan Note (Signed)
Continue apixaban as primary prophylaxis for an acute stroke Continue bisoprolol for rate control

## 2022-09-23 NOTE — Sepsis Progress Note (Signed)
Notified bedside nurse of need to draw lactic acid.  

## 2022-09-23 NOTE — Assessment & Plan Note (Addendum)
Patient with complaints of shortness of breath and wheezing Chest x-ray shows mild interstitial edema and pulmonary vascular congestion Last 2D echocardiogram from 04/24 shows an LVEF of 55 to 60% Patient received 2 L IV fluid bolus in the ER Will give a dose of Lasix 20 mg IV x 1 As needed bronchodilator therapy Continue diuretic therapy in a.m.

## 2022-09-23 NOTE — Assessment & Plan Note (Signed)
As evidenced by tachycardia, tachypnea, bilateral lower extremity cellulitis and infected wounds with purulent drainage as well as leukocytosis. Place patient empirically on vancomycin and Rocephin Continue IV fluid resuscitation Consult wound care for bilateral lower extremity wounds with purulent drainage Follow-up results of blood cultures

## 2022-09-23 NOTE — Assessment & Plan Note (Signed)
Status post TAVR.  Stable.

## 2022-09-23 NOTE — Assessment & Plan Note (Signed)
Continue Synthroid °

## 2022-09-23 NOTE — ED Notes (Signed)
ED TO INPATIENT HANDOFF REPORT  ED Nurse Name and Phone #: Al Corpus, RN [phone]  S Name/Age/Gender Cindy Richmond 72 y.o. female Room/Bed: WA03/WA03  Code Status   Code Status: Full Code  Home/SNF/Other [name] Patient oriented to: self, place, time, and situation Is this baseline? Yes   Triage Complete: Triage complete  Chief Complaint Sepsis due to cellulitis (HCC) [L03.90, A41.9]  Triage Note BIB PTAR from [facility] BLE infection that she is being treated for at the facility. Also c/o rash under right breast for couple months not being treated. Pt requested to come to ED for further evaluation.    Allergies Allergies  Allergen Reactions   Nickel Other (See Comments)    Nickel earrings, ear holes get infected   Penicillins Other (See Comments)    Allergy recorded when patient was a baby (exact allergic reaction not noted)     Level of Care/Admitting Diagnosis ED Disposition     ED Disposition  Admit   Condition  --   Comment  Hospital Area: [facility] [[id number]]  Level of Care: Progressive [102]  Admit to Progressive based on following criteria: MULTISYSTEM THREATS such as stable sepsis, metabolic/electrolyte imbalance with or without encephalopathy that is responding to early treatment.  May admit patient to [facility] or [facility] if equivalent level of care is available:: Yes  Covid Evaluation: Asymptomatic - no recent exposure (last 10 days) testing not required  Diagnosis: Sepsis due to cellulitis [place]) [[id number]]  Admitting Physician: Lonia Mad  Attending Physician: Lonia Mad  Certification:: I certify this patient will need inpatient services for at least 2 midnights  Estimated Length of Stay: 3          B Medical/Surgery History Past Medical History:  Diagnosis Date   Atrial fibrillation, persistent (HCC)    Dyspnea    with head low, sleep apnea   Dysrhythmia    afib   GERD  (gastroesophageal reflux disease)    H/O hiatal hernia    Hematuria    History of kidney stones    HOH (hard of hearing)    sightly, bilaterally   Hyperlipidemia    Hypertension    Hypothyroidism    Parkinson disease    S/P TAVR (transcatheter aortic valve replacement) 08/03/2019   s/p TAVR with 26 mm Edwards S3U via the TF approach by Drs Aundra Dubin & Cornelius Moras   Severe aortic stenosis    Sleep apnea    Past Surgical History:  Procedure Laterality Date   ABDOMINAL HYSTERECTOMY     lso   CARDIOVERSION N/A 11/30/2019   Procedure: CARDIOVERSION;  Surgeon: Lewayne Bunting, MD;  Location: [facility] ENDOSCOPY;  Service: Cardiovascular;  Laterality: N/A;   CYSTOSCOPY W/ RETROGRADES  02/28/2012   Procedure: CYSTOSCOPY WITH RETROGRADE PYELOGRAM;  Surgeon: Sebastian Ache, MD;  Location: [facility];  Service: Urology;  Laterality: Right;   CYSTOSCOPY WITH STENT PLACEMENT  02/28/2012   Procedure: CYSTOSCOPY WITH STENT PLACEMENT;  Surgeon: Sebastian Ache, MD;  Location: [facility];  Service: Urology;  Laterality: Left;   CYSTOSCOPY/RETROGRADE/URETEROSCOPY/STONE EXTRACTION WITH BASKET  02/28/2012   Procedure: CYSTOSCOPY/RETROGRADE/URETEROSCOPY/STONE EXTRACTION WITH BASKET;  Surgeon: Sebastian Ache, MD;  Location: [facility];  Service: Urology;  Laterality: Left;   HOLMIUM LASER APPLICATION  02/28/2012   Procedure: HOLMIUM LASER APPLICATION;  Surgeon: Sebastian Ache, MD;  Location: [facility];  Service: Urology;  Laterality: Left;   INTRAOPERATIVE TRANSTHORACIC ECHOCARDIOGRAM  08/03/2019   Procedure: Intraoperative Transthoracic Echocardiogram;  Surgeon: Kathleene Hazel, MD;  Location: [facility] OR;  Service: Open Heart Surgery;;   KNEE ARTHROSCOPY     right   REVERSE SHOULDER ARTHROPLASTY Left 07/22/2018   Procedure: REVERSE SHOULDER ARTHROPLASTY;  Surgeon: Bjorn Pippin, MD;  Location: [facility] ORS;  Service: Orthopedics;  Laterality: Left;   RIGHT/LEFT HEART  CATH AND CORONARY ANGIOGRAPHY N/A 07/05/2019   Procedure: RIGHT/LEFT HEART CATH AND CORONARY ANGIOGRAPHY;  Surgeon: Kathleene Hazel, MD;  Location: [facility] INVASIVE CV LAB;  Service: Cardiovascular;  Laterality: N/A;   TONSILLECTOMY     TRANSCATHETER AORTIC VALVE REPLACEMENT, TRANSFEMORAL N/A 08/03/2019   Procedure: TRANSCATHETER AORTIC VALVE REPLACEMENT, TRANSFEMORAL;  Surgeon: Kathleene Hazel, MD;  Location: [facility] OR;  Service: Open Heart Surgery;  Laterality: N/A;   VEIN LIGATION       A IV Location/Drains/Wounds Patient Lines/Drains/Airways Status     Active Line/Drains/Airways     Name Placement date Placement time Site Days   Peripheral IV 09/23/22 18 G 1" Anterior;Distal;Left;Upper Arm 09/23/22  1300  Arm  less than 1   Peripheral IV 09/23/22 20 G 1" Anterior;Right;Lateral Hand 09/23/22  1310  Hand  less than 1   Ureteral Drain/Stent Left ureter 6 Fr. 02/28/12  1325  Left ureter  3860            Intake/Output Last 24 hours  Intake/Output Summary (Last 24 hours) at 09/23/2022 1659 Last data filed at 09/23/2022 1636 Gross per 24 hour  Intake 2300 ml  Output --  Net 2300 ml    Labs/Imaging Results for orders placed or performed during the hospital encounter of 09/23/22 (from the past 48 hour(s))  Comprehensive metabolic panel     Status: Abnormal   Collection Time: 09/23/22  1:10 PM  Result Value Ref Range   Sodium 138 135 - 145 mmol/L   Potassium 4.2 3.5 - 5.1 mmol/L   Chloride 97 (L) 98 - 111 mmol/L   CO2 29 22 - 32 mmol/L   Glucose, Bld 115 (H) 70 - 99 mg/dL    Comment: Glucose reference range applies only to samples taken after fasting for at least 8 hours.   BUN 21 8 - 23 mg/dL   Creatinine, Ser 6.30 0.44 - 1.00 mg/dL   Calcium 9.1 8.9 - 16.0 mg/dL   Total Protein 7.1 6.5 - 8.1 g/dL   Albumin 3.4 (L) 3.5 - 5.0 g/dL   AST 14 (L) 15 - 41 U/L   ALT 8 0 - 44 U/L   Alkaline Phosphatase 95 38 - 126 U/L   Total Bilirubin 0.7 0.3 - 1.2 mg/dL   GFR, Estimated  >10 >93 mL/min    Comment: (NOTE) Calculated using the CKD-EPI Creatinine Equation (2021)    Anion gap 12 5 - 15    Comment: Performed at [facility], [address] [place], [place] [address]  CBC with Differential     Status: Abnormal   Collection Time: 09/23/22  1:10 PM  Result Value Ref Range   WBC 11.7 (H) 4.0 - 10.5 K/uL   RBC 4.98 3.87 - 5.11 MIL/uL   Hemoglobin 11.2 (L) 12.0 - 15.0 g/dL   HCT 32.2 02.5 - 42.7 %   MCV 78.5 (L) 80.0 - 100.0 fL   MCH 22.5 (L) 26.0 - 34.0 pg   MCHC 28.6 (L) 30.0 - 36.0 g/dL   RDW 06.2 (H) 37.6 - 28.3 %   Platelets 183 150 - 400  K/uL   nRBC 0.0 0.0 - 0.2 %   Neutrophils Relative % 78 %   Neutro Abs 9.2 (H) 1.7 - 7.7 K/uL   Lymphocytes Relative 9 %   Lymphs Abs 1.0 0.7 - 4.0 K/uL   Monocytes Relative 11 %   Monocytes Absolute 1.3 (H) 0.1 - 1.0 K/uL   Eosinophils Relative 1 %   Eosinophils Absolute 0.1 0.0 - 0.5 K/uL   Basophils Relative 0 %   Basophils Absolute 0.1 0.0 - 0.1 K/uL   Immature Granulocytes 1 %   Abs Immature Granulocytes 0.09 (H) 0.00 - 0.07 K/uL    Comment: Performed at [facility], [address] [place], [place] [address]  Protime-INR     Status: Abnormal   Collection Time: 09/23/22  1:10 PM  Result Value Ref Range   Prothrombin Time 18.2 (H) 11.4 - 15.2 seconds   INR 1.5 (H) 0.8 - 1.2    Comment: (NOTE) INR goal varies based on device and disease states. Performed at [facility], [address], [place], [place] [address]   APTT     Status: Abnormal   Collection Time: 09/23/22  1:10 PM  Result Value Ref Range   aPTT 40 (H) 24 - 36 seconds    Comment:        IF BASELINE aPTT IS ELEVATED, SUGGEST PATIENT RISK ASSESSMENT BE USED TO DETERMINE APPROPRIATE ANTICOAGULANT THERAPY. Performed at [facility], [address] [place], [place] [address]   I-Stat Lactic Acid     Status: None   Collection Time: 09/23/22  2:19 PM  Result Value  Ref Range   Lactic Acid, Venous 1.5 0.5 - 1.9 mmol/L  Resp panel by RT-PCR (RSV, Flu A&B, Covid) Anterior Nasal Swab     Status: None   Collection Time: 09/23/22  2:20 PM   Specimen: Anterior Nasal Swab  Result Value Ref Range   SARS Coronavirus 2 by RT PCR NEGATIVE NEGATIVE    Comment: (NOTE) SARS-CoV-2 target nucleic acids are NOT DETECTED.  The SARS-CoV-2 RNA is generally detectable in upper respiratory specimens during the acute phase of infection. The lowest concentration of SARS-CoV-2 viral copies this assay can detect is 138 copies/mL. A negative result does not preclude SARS-Cov-2 infection and should not be used as the sole basis for treatment or other patient management decisions. A negative result may occur with  improper specimen collection/handling, submission of specimen other than nasopharyngeal swab, presence of viral mutation(s) within the areas targeted by this assay, and inadequate number of viral copies(<138 copies/mL). A negative result must be combined with clinical observations, patient history, and epidemiological information. The expected result is Negative.  Fact Sheet for Patients:  BloggerCourse.com  Fact Sheet for Healthcare Providers:  SeriousBroker.it  This test is no t yet approved or cleared by the [place] FDA and  has been authorized for detection and/or diagnosis of SARS-CoV-2 by FDA under an Emergency Use Authorization (EUA). This EUA will remain  in effect (meaning this test can be used) for the duration of the COVID-19 declaration under Section 564(b)(1) of the Act, 21 U.S.C.section 360bbb-3(b)(1), unless the authorization is terminated  or revoked sooner.       Influenza A by PCR NEGATIVE NEGATIVE   Influenza B by PCR NEGATIVE NEGATIVE    Comment: (NOTE) The Xpert Xpress SARS-CoV-2/FLU/RSV plus assay is intended as an aid in the diagnosis of influenza from Nasopharyngeal swab  specimens and should not be used as  a sole basis for treatment. Nasal washings and aspirates are unacceptable for Xpert Xpress SARS-CoV-2/FLU/RSV testing.  Fact Sheet for Patients: BloggerCourse.com  Fact Sheet for Healthcare Providers: SeriousBroker.it  This test is not yet approved or cleared by the [place] FDA and has been authorized for detection and/or diagnosis of SARS-CoV-2 by FDA under an Emergency Use Authorization (EUA). This EUA will remain in effect (meaning this test can be used) for the duration of the COVID-19 declaration under Section 564(b)(1) of the Act, 21 U.S.C. section 360bbb-3(b)(1), unless the authorization is terminated or revoked.     Resp Syncytial Virus by PCR NEGATIVE NEGATIVE    Comment: (NOTE) Fact Sheet for Patients: BloggerCourse.com  Fact Sheet for Healthcare Providers: SeriousBroker.it  This test is not yet approved or cleared by the [place] FDA and has been authorized for detection and/or diagnosis of SARS-CoV-2 by FDA under an Emergency Use Authorization (EUA). This EUA will remain in effect (meaning this test can be used) for the duration of the COVID-19 declaration under Section 564(b)(1) of the Act, 21 U.S.C. section 360bbb-3(b)(1), unless the authorization is terminated or revoked.  Performed at [facility], [address] [place], [place] [address]    DG Chest Port 1 View  Result Date: 09/23/2022 CLINICAL DATA:  Short of breath EXAM: PORTABLE CHEST 1 VIEW COMPARISON:  Prior chest x-ray 04/28/2022 FINDINGS: Stable cardiomegaly. Evidence of prior trans arterial aortic valve replacement. Pulmonary vascular congestion with diffuse mild interstitial prominence is similar compared to prior. Low inspiratory volumes. No pneumothorax. No airspace infiltrate. Left reverse shoulder arthroplasty. IMPRESSION: 1.  Pulmonary vascular congestion with mild interstitial edema, similar compared to prior. 2. Stable mild cardiomegaly with evidence of prior TAVR. Electronically Signed   By: Malachy Moan M.D.   On: 09/23/2022 14:23    Pending Labs Unresulted Labs (From admission, onward)     Start     Ordered   09/24/22 0500  Protime-INR  Tomorrow morning,   R        09/23/22 1536   09/24/22 0500  Cortisol-am, blood  Tomorrow morning,   R        09/23/22 1536   09/24/22 0500  Procalcitonin  Tomorrow morning,   R       References:    Procalcitonin Lower Respiratory Tract Infection AND Sepsis Procalcitonin Algorithm   09/23/22 1536   09/24/22 0500  CBC  Tomorrow morning,   R        09/23/22 1536   09/24/22 0500  Basic metabolic panel  Tomorrow morning,   R        09/23/22 1536   09/23/22 1319  Culture, blood (routine x 2)  BLOOD CULTURE X 2,   R (with STAT occurrences)     Question:  Patient immune status  Answer:  Normal   09/23/22 1320   09/23/22 1319  Urinalysis, Routine w reflex microscopic -Urine, Clean Catch  Once,   URGENT       Question:  Specimen Source  Answer:  Urine, Clean Catch   09/23/22 1320            Vitals/Pain Today's Vitals   09/23/22 1430 09/23/22 1445 09/23/22 1500 09/23/22 1515  BP: (!) 144/88 127/84 111/66 113/82  Pulse: (!) 115 (!) 101 (!) 114 (!) 109  Resp: (!) 24 (!) 26 (!) 25 14  Temp:      TempSrc:      SpO2: 94% 96% 94% 93%  Weight:  Isolation Precautions No active isolations  Medications Medications  levothyroxine (SYNTHROID) tablet 75 mcg (has no administration in time range)  pantoprazole (PROTONIX) EC tablet 40 mg (has no administration in time range)  apixaban (ELIQUIS) tablet 5 mg (has no administration in time range)  carbidopa-levodopa (SINEMET IR) 25-100 MG per tablet immediate release 2 tablet (has no administration in time range)  multivitamin (PROSIGHT) tablet 1 tablet (has no administration in time range)  ascorbic acid (VITAMIN C)  tablet 250 mg (has no administration in time range)  acetaminophen (TYLENOL) tablet 650 mg (has no administration in time range)    Or  acetaminophen (TYLENOL) suppository 650 mg (has no administration in time range)  ondansetron (ZOFRAN) tablet 4 mg (has no administration in time range)    Or  ondansetron (ZOFRAN) injection 4 mg (has no administration in time range)  cefTRIAXone (ROCEPHIN) 1 g in sodium chloride 0.9 % 100 mL IVPB (has no administration in time range)  cyanocobalamin (VITAMIN B12) tablet 1,000 mcg (1,000 mcg Oral Given 09/23/22 1636)  carbidopa-levodopa (SINEMET IR) 25-100 MG per tablet immediate release 1 tablet (1 tablet Oral Given 09/23/22 1636)  ipratropium-albuterol (DUONEB) 0.5-2.5 (3) MG/3ML nebulizer solution 3 mL (has no administration in time range)  potassium chloride SA (KLOR-CON M) CR tablet 40 mEq (has no administration in time range)  furosemide (LASIX) injection 20 mg (has no administration in time range)  bisoprolol (ZEBETA) tablet 5 mg (has no administration in time range)    And  hydrochlorothiazide (HYDRODIURIL) tablet 6.25 mg (has no administration in time range)  furosemide (LASIX) tablet 40 mg (has no administration in time range)  lactated ringers bolus 1,000 mL (0 mLs Intravenous Stopped 09/23/22 1510)    And  lactated ringers bolus 1,000 mL (1,000 mLs Intravenous New Bag/Given 09/23/22 1413)  ipratropium-albuterol (DUONEB) 0.5-2.5 (3) MG/3ML nebulizer solution 3 mL (3 mLs Nebulization Given 09/23/22 1334)  nystatin (MYCOSTATIN/NYSTOP) topical powder ( Topical Given 09/23/22 1629)  ceFEPIme (MAXIPIME) 2 g in sodium chloride 0.9 % 100 mL IVPB (0 g Intravenous Stopped 09/23/22 1510)  vancomycin (VANCOCIN) IVPB 1000 mg/200 mL premix (0 mg Intravenous Stopped 09/23/22 1636)    Mobility walks with device     Focused Assessments Pulmonary Assessment Handoff:  Lung sounds: Bilateral Breath Sounds: Diminished, Expiratory wheezes O2 Device: Room Air       R Recommendations: See Admitting Provider Note  Report given to:   Additional Notes: Obese, A&Ox4, NAD, calm, interactive, tachypneic and upper airway wheezing, she states her breathing is always like this, Legs edemetous and weaping, yeast odor from breast abd and legs

## 2022-09-23 NOTE — Assessment & Plan Note (Signed)
Continue bisoprolol/HCTZ

## 2022-09-23 NOTE — Assessment & Plan Note (Signed)
BMI 50.26 Complicates overall prognosis and care Lifestyle modification and exercise has been discussed with patient

## 2022-09-23 NOTE — ED Notes (Signed)
Admitting MD at [facility].  

## 2022-09-23 NOTE — ED Notes (Signed)
Purwick placed on Pt.

## 2022-09-23 NOTE — Progress Notes (Addendum)
A consult was received from an ED physician for vancomycin and aztreonam per pharmacy dosing.  The patient's profile has been reviewed for ht/wt/allergies/indication/available labs.    Pt reported she had a reaction to PCN when she was a baby and is unsure of what reaction she had to PCN.  She stated that she takes  prophylaxis abx when she goes to the dentist. Pharmacy fill hx indicated that she has been getting keflex 2gm prior to dental appt. Ok with Mertha Baars to change aztreonam to cefepime.  A one time order has been placed for vancomycin 2gm and cefepime 2gm IV x1.  Further antibiotics/pharmacy consults should be ordered by admitting physician if indicated.                       Thank you, Lucia Gaskins 09/23/2022  1:28 PM

## 2022-09-23 NOTE — ED Notes (Signed)
ED PA at [facility] 

## 2022-09-23 NOTE — ED Provider Notes (Signed)
[place] EMERGENCY DEPARTMENT AT [facility] Provider Note   CSN: [id number] Arrival date & time: 09/23/22  1209     History  Chief Complaint  Patient presents with   Wound Infection    Cindy Richmond is a 72 y.o. female.  With past medical history of aortic stenosis s/p TAVR, atrial fibrillation on Eliquis, GERD, hypertension, hyperlipidemia, Parkinson's disease who presents to the emergency department with wound infection.  States she is having rash under her breasts as well as wounds to her legs.  She is unsure how long the wounds to the bilateral lower extremities have been present.  States that they have been draining but she is unsure how long.  She has attempted to take care of them, however states that she cannot bend over and reach her leg so is very limited in this.  Additionally she notes that she has had rash under both breasts that have been itching.  This has been present for months. While interviewing the patient, she appears dyspneic, wheezing.  She states that this is probably her baseline and feels short of breath usually.  She denies having chest pain or palpitations.  She is unsure if she has had fevers recently but states that she is just felt "blah" over the past 4 days.  Denies abdominal pain, nausea, vomiting or diarrhea, dysuria.   HPI     Home Medications Prior to Admission medications   Medication Sig Start Date End Date Taking? Authorizing Provider  acetaminophen (TYLENOL) 325 MG tablet Take 650 mg by mouth every 6 (six) hours as needed for fever (or pain).   Yes [provider]  apixaban (ELIQUIS) 5 MG TABS tablet Take 1 tablet (5 mg total) by mouth 2 (two) times daily. 04/17/22  Yes Lewayne Bunting, MD  bisoprolol-hydrochlorothiazide [facility]) 5-6.25 MG tablet Take 1 tablet by mouth daily. 10/12/18  Yes [provider]  carbidopa-levodopa (SINEMET IR) 25-100 MG tablet 2 AT 8AM, 2 AT NOON, 1 AT 4PM, AND 1 AT 8PM MAY TAKE EXTRA AS  NEEDED Patient taking differently: Take 1-2 tablets by mouth See admin instructions. Take 2 tablets by mouth at 8 AM and 12 NOON. Take 1 tablet by mouth at 4 PM and 8 PM. 02/26/22  Yes Tat, Octaviano Batty, DO  Cyanocobalamin (VITAMIN B-12) 5000 MCG TBDP Take 5,000 mcg by mouth daily.   Yes [provider]  esomeprazole (NEXIUM) 20 MG capsule Take 20 mg by mouth daily.    Yes [provider]  furosemide (LASIX) 40 MG tablet Take 40 mg by mouth in the morning.   Yes [provider]  levothyroxine (SYNTHROID, LEVOTHROID) 75 MCG tablet Take 75 mcg by mouth daily before breakfast.    Yes [provider]  Multiple Vitamins-Minerals (PRESERVISION AREDS PO) Take 1 capsule by mouth 2 (two) times daily.   Yes [provider]  nystatin cream (MYCOSTATIN) Apply 1 Application topically 3 (three) times daily as needed (for redness or rash- abdominal folds).   Yes [provider]  potassium chloride SA (KLOR-CON M) 20 MEQ tablet Take 2 tablets (40 mEq total) by mouth daily. Patient taking differently: Take 20 mEq by mouth daily. 05/01/22  Yes Marjie Skiff E, PA-C  vitamin C (ASCORBIC ACID) 250 MG tablet Take 250 mg by mouth at bedtime.   Yes [provider]  AMBULATORY NON FORMULARY MEDICATION Lift chair Dx:  G20.A1 01/29/22   Tat, Octaviano Batty, DO  AMBULATORY NON FORMULARY MEDICATION Wheelchair Dx:  G20.A1 01/29/22   Tat, Octaviano Batty, DO  cephALEXin (KEFLEX) 500 MG capsule Take 2,000 mg (4 capsules) one hour to all dental visits. Patient not taking: Reported on 09/23/2022 08/11/19   Janetta Hora, PA-C  furosemide (LASIX) 80 MG tablet Take Lasix 40 mg daily with an additional 40 mg as needed daily. Patient not taking: Reported on 09/23/2022 06/07/22   Lewayne Bunting, MD  oxyCODONE-acetaminophen (PERCOCET/ROXICET) 5-325 MG tablet Take 0.5-1 tablets by mouth every 6 (six) hours as needed for severe pain. Patient not taking: Reported on 09/23/2022 04/29/22    Tilden Fossa, MD      Allergies    Nickel and Penicillins    Review of Systems   Review of Systems  Constitutional:  Positive for fatigue.  Skin:  Positive for wound.  All other systems reviewed and are negative.   Physical Exam Updated Vital Signs BP 119/84   Pulse (!) 104   Temp 98.4 F (36.9 C) (Oral)   Resp (!) 23   Wt (!) 137 kg   SpO2 95%   BMI 50.26 kg/m  Physical Exam Vitals and nursing note reviewed.  Constitutional:      Appearance: She is obese. She is ill-appearing.  HENT:     Head: Normocephalic.     Mouth/Throat:     Mouth: Mucous membranes are dry.  Eyes:     General: No scleral icterus. Cardiovascular:     Rate and Rhythm: Regular rhythm. Tachycardia present.     Pulses:          Radial pulses are 1+ on the right side and 1+ on the left side.     Heart sounds: No murmur heard. Pulmonary:     Effort: Tachypnea present.     Breath sounds: Wheezing present.  Abdominal:     General: Bowel sounds are normal.     Palpations: Abdomen is soft.     Tenderness: There is no abdominal tenderness.  Musculoskeletal:     Right lower leg: 2+ Pitting Edema present.     Left lower leg: 2+ Pitting Edema present.  Skin:    General: Skin is warm and dry.     Capillary Refill: Capillary refill takes less than 2 seconds.     Findings: Erythema and rash present.     Comments: Bilateral lower extremity wounds.  This appears to be likely venous stasis but drainage appears to be purulent.  She is having some tenderness.  Under both breast she has yeast infection.  Neurological:     General: No focal deficit present.     Mental Status: She is alert and oriented to person, place, and time.  Psychiatric:        Mood and Affect: Mood normal.        Behavior: Behavior normal.     ED Results / Procedures / Treatments   Labs (all labs ordered are listed, but only abnormal results are displayed) Labs Reviewed  COMPREHENSIVE METABOLIC PANEL - Abnormal; Notable for  the following components:      Result Value   Chloride 97 (*)    Glucose, Bld 115 (*)    Albumin 3.4 (*)    AST 14 (*)    All other components within normal limits  CBC WITH DIFFERENTIAL/PLATELET - Abnormal; Notable for the following components:   WBC 11.7 (*)    Hemoglobin 11.2 (*)    MCV 78.5 (*)    MCH 22.5 (*)    MCHC 28.6 (*)  RDW 20.6 (*)    Neutro Abs 9.2 (*)    Monocytes Absolute 1.3 (*)    Abs Immature Granulocytes 0.09 (*)    All other components within normal limits  PROTIME-INR - Abnormal; Notable for the following components:   Prothrombin Time 18.2 (*)    INR 1.5 (*)    All other components within normal limits  APTT - Abnormal; Notable for the following components:   aPTT 40 (*)    All other components within normal limits  CULTURE, BLOOD (ROUTINE X 2)  CULTURE, BLOOD (ROUTINE X 2)  RESP PANEL BY RT-PCR (RSV, FLU A&B, COVID)  RVPGX2  URINALYSIS, ROUTINE W REFLEX MICROSCOPIC  I-STAT CG4 LACTIC ACID, ED  I-STAT CG4 LACTIC ACID, ED    EKG EKG Interpretation Date/Time:  Monday September 23 2022 12:29:21 EDT Ventricular Rate:  105 PR Interval:    QRS Duration:  106 QT Interval:  342 QTC Calculation: 452 R Axis:   10  Text Interpretation: Atrial fibrillation RSR' in V1 or V2, right VCD or RVH Confirmed by Alvester Chou [phone]) on 09/23/2022 12:40:28 PM  Radiology DG Chest Port 1 View  Result Date: 09/23/2022 CLINICAL DATA:  Short of breath EXAM: PORTABLE CHEST 1 VIEW COMPARISON:  Prior chest x-ray 04/28/2022 FINDINGS: Stable cardiomegaly. Evidence of prior trans arterial aortic valve replacement. Pulmonary vascular congestion with diffuse mild interstitial prominence is similar compared to prior. Low inspiratory volumes. No pneumothorax. No airspace infiltrate. Left reverse shoulder arthroplasty. IMPRESSION: 1. Pulmonary vascular congestion with mild interstitial edema, similar compared to prior. 2. Stable mild cardiomegaly with evidence of prior TAVR.  Electronically Signed   By: Malachy Moan M.D.   On: 09/23/2022 14:23    Procedures .Critical Care  Performed by: Cristopher Peru, PA-C Authorized by: Cristopher Peru, PA-C   Critical care provider statement:    Critical care time (minutes):  40   Critical care time was exclusive of:  Separately billable procedures and treating other patients   Critical care was necessary to treat or prevent imminent or life-threatening deterioration of the following conditions:  Sepsis   Critical care was time spent personally by me on the following activities:  Interpretation of cardiac output measurements, obtaining history from patient or surrogate, examination of patient, evaluation of patient's response to treatment, discussions with primary provider, discussions with consultants, development of treatment plan with patient or surrogate, ordering and performing treatments and interventions, ordering and review of laboratory studies, ordering and review of radiographic studies, pulse oximetry, re-evaluation of patient's condition and review of old charts   I assumed direction of critical care for this patient from another provider in my specialty: no     Care discussed with: admitting provider      Medications Ordered in ED Medications  lactated ringers infusion (has no administration in time range)  nystatin (MYCOSTATIN/NYSTOP) topical powder (has no administration in time range)  vancomycin (VANCOCIN) IVPB 1000 mg/200 mL premix (1,000 mg Intravenous New Bag/Given 09/23/22 1512)  lactated ringers bolus 1,000 mL (0 mLs Intravenous Stopped 09/23/22 1510)    And  lactated ringers bolus 1,000 mL (1,000 mLs Intravenous New Bag/Given 09/23/22 1413)  ipratropium-albuterol (DUONEB) 0.5-2.5 (3) MG/3ML nebulizer solution 3 mL (3 mLs Nebulization Given 09/23/22 1334)  ceFEPIme (MAXIPIME) 2 g in sodium chloride 0.9 % 100 mL IVPB (0 g Intravenous Stopped 09/23/22 1510)    ED Course/ Medical Decision Making/  A&P Clinical Course as of 09/23/22 1525  Mon Sep 23, 2022  1344  This is a 72 year old female presenting from independent living with concern for bilateral lower extremity swelling, difficulty with ambulation, rash under her breast.  Patient has significant pitting edema to lower extremities, likely underlying stasis dermatitis but with areas of ulcerations and purulence, warmth of the overlying skin, which is suggestive of bacterial infection or skin breakdown.  Patient is tachycardic.  She has audible wheezing on exam.  She also is noted to have a likely candidal rash underneath her bilateral breasts.  Labs are still pending, but initial white blood cell count 11.7.  Patient is pending likely infectious workup and admission. [MT]    Clinical Course User Index [MT] Trifan, Kermit Balo, MD   {    Medical Decision Making Amount and/or Complexity of Data Reviewed Labs: ordered. Radiology: ordered.  Risk Prescription drug management. Decision regarding hospitalization.  Initial Impression and Ddx 71 year old female who presents to the emergency department with concern over wound Patient PMH that increases complexity of ED encounter: Aortic stenosis s/p TAVR, atrial fibrillation, GERD, hypertension, Parkinson's disease  Interpretation of Diagnostics I independent reviewed and interpreted the labs as followed: wbc 11.7, lactic negative  - I independently visualized the following imaging with scope of interpretation limited to determining acute life threatening conditions related to emergency care: CXR, which revealed vascular congestion  Patient Reassessment and Ultimate Disposition/Management 72 year old female who presents to the emergency department with wound infection.  On exam she has bilateral lower extremity wounds.  Likely resulting from chronic venous stasis which turned to cellulitis, however there is areas of purulent drainage from both legs.  The area is erythematous and warm.   While examining her the patient is noted to be dyspneic.  She is also tachycardic.  She is wheezing on exam.  She feels like some of her breathing is at baseline but I am concerned for SIRS/sepsis with her vitals and presentation of wound infection.  I am ordering a sepsis workup and will start empiric antibiotics.  Anticipate admission to the hospital.  Labs with mild elevation in leuks. Lactic is negative. No significant elyte dysfunction. Urine and RVP are pending, cultures are pending.  I empirically started her on vanc and cefepime.  She was tachypneic and tachycardic, wheezing so she was given duoneb, which mildly improved wheezing.   Given her SIRS presentation, feel she needs admission for IV antibiotics. She will also need wound care and likely culture from this. Suspect that her lower extremity wounds are etiology of sirs presentation but still pending urine.   Consulted and spoke with hospitalist, Dr. Joylene Igo who will admit patient.   Patient management required discussion with the following services or consulting groups:  Hospitalist Service  Complexity of Problems Addressed Acute illness or injury that poses threat of life of bodily function  Additional Data Reviewed and Analyzed Further history obtained from: EMS on arrival, Past medical history and medications listed in the EMR, Prior ED visit notes, and Care Everywhere  Patient Encounter Risk Assessment SDOH impact on management, Use of parenteral controlled substances, and Consideration of hospitalization  Final Clinical Impression(s) / ED Diagnoses Final diagnoses:  SIRS (systemic inflammatory response syndrome) (HCC)    Rx / DC Orders ED Discharge Orders     None         Cristopher Peru, PA-C 09/23/22 1525    Terald Sleeper, MD 09/24/22 [phone]

## 2022-09-23 NOTE — ED Triage Notes (Addendum)
BIB PTAR from [facility] BLE infection that she is being treated for at the facility. Also c/o rash under right breast for couple months not being treated. Pt requested to come to ED for further evaluation.

## 2022-09-24 DIAGNOSIS — A419 Sepsis, unspecified organism: Secondary | ICD-10-CM | POA: Diagnosis not present

## 2022-09-24 DIAGNOSIS — L0390 Cellulitis, unspecified: Secondary | ICD-10-CM | POA: Diagnosis not present

## 2022-09-24 MED ORDER — VANCOMYCIN HCL 1500 MG/300ML IV SOLN
1500.0000 mg | INTRAVENOUS | Status: DC
  Administered 2022-09-24: 1500 mg via INTRAVENOUS
  Filled 2022-09-24 (×2): qty 300

## 2022-09-24 MED ORDER — SODIUM CHLORIDE 0.9 % IV SOLN
2.0000 g | INTRAVENOUS | Status: AC
  Administered 2022-09-24 – 2022-10-02 (×9): 2 g via INTRAVENOUS
  Filled 2022-09-24 (×9): qty 20

## 2022-09-24 NOTE — TOC Progression Note (Signed)
Transition of Care [facility]) - Progression Note    Patient Details  Name: Cindy Richmond MRN: [id number] Date of Birth: February 07, 1951  [facility] CM/SW Contact  Geni Bers, RN Phone Number: 09/24/2022, 9:14 AM  Clinical Narrative:     Spoke with pt concerning discharge plans. Pt was not sure of any needs at present time. TOC will continue to follow. Pt is from [facility] in [place]. Pt plan to return there.   Expected Discharge Plan: Home w Home Health Services Barriers to Discharge: No Barriers Identified  Expected Discharge Plan and Services       Living arrangements for the past 2 months: Assisted Living Facility                                       Social Determinants of Health (SDOH) Interventions SDOH Screenings   Food Insecurity: No Food Insecurity (09/24/2022)  Housing: Low Risk  (09/24/2022)  Transportation Needs: No Transportation Needs (09/24/2022)  Utilities: Not At Risk (09/24/2022)  Financial Resource Strain: Low Risk  (10/18/2021)  Tobacco Use: Low Risk  (09/23/2022)    Readmission Risk Interventions     No data to display

## 2022-09-24 NOTE — Evaluation (Signed)
Physical Therapy Evaluation Patient Details Name: Cindy Richmond MRN: [id number] DOB: Aug 28, 1950 Today's Date: 09/24/2022  History of Present Illness  72 yo female presented to ED on 09/23/2022 due to B LE pain and foul-smelling drainage from LE wounds. Pt reports chills and feeling feverish. Pt was tachycardic and tachyphemic with abn labs including elevated WBC count. Pt was found to have sepsis secondary to B LE cellulitis with ulcerative changes. Pt started on IV ABX and fluid. Pt PMH includes but is not limited to: dCHF, A-fib, HTN, hypothyroidism, aortic stenosis, HLD, PD, s/p TAVR, OSA, L reverse TSA, and HOH.  Clinical Impression    Pt admitted with above diagnosis.  Pt currently with functional limitations due to the deficits listed below (see PT Problem List). Pt in bed when PT arrived. Pt agreeable to therapy intervention and reports she is ready to get OOB. Pt required mod A for supine to sit with HOB elevated and use of bed pad to EOB. Pt required min A for sit to stand  from elevated EOB, mod A for SPT bed to wc. PT noted SOB with pt reporting feeling SOB and O2 saturation seated EOB 92% (104 PR) and s/p transfer to recliner 87% (110 PR) on RA, cues for pursed lip breathing and seated therapeutic rest break pt able to recover to 90% in 2:13. PT communicated with nurse per pt DOE and O2 saturation on RA and nurse offered and encouraged pt to have supplemental O2 and pt adamantly disagreed. Pt left seated in recliner, all needs in place on RA at 90%.  Pt will benefit from acute skilled PT to increase their independence and safety with mobility to allow discharge.         If plan is discharge home, recommend the following: Two people to help with walking and/or transfers;Two people to help with bathing/dressing/bathroom;Assistance with cooking/housework;Direct supervision/assist for medications management;Assist for transportation;Help with stairs or ramp for entrance   Can travel by private  vehicle        Equipment Recommendations None recommended by PT  Recommendations for Other Services       Functional Status Assessment Patient has had a recent decline in their functional status and demonstrates the ability to make significant improvements in function in a reasonable and predictable amount of time.     Precautions / Restrictions Precautions Precautions: Fall Restrictions Weight Bearing Restrictions: No      Mobility  Bed Mobility Overal bed mobility: Needs Assistance Bed Mobility: Supine to Sit     Supine to sit: Mod assist, HOB elevated     General bed mobility comments: use of bed pad to assist pt to EOB directing pt to use bed rail and assist to scoot hips forward and maintain midline    Transfers Overall transfer level: Needs assistance Equipment used: Rolling walker (2 wheels) Transfers: Bed to chair/wheelchair/BSC, Sit to/from Stand Sit to Stand: From elevated surface, Min assist Stand pivot transfers: Mod assist         General transfer comment: pt required cues, pull to stand at RW from elevated EOB with min A, pt required A to manuver RW for SPT to recliner, once in recliner pt required mod A and cues for inititation and proper UE placement to push to stand to reposition pt requires increased time, increased volume for cues and encouragement to increase IND as able    Ambulation/Gait               General Gait Details: NT  due to required A for transfer tasks, pt reported pain levels and apparent SOB  Stairs            Wheelchair Mobility     Tilt Bed    Modified Rankin (Stroke Patients Only)       Balance Overall balance assessment: History of Falls, Needs assistance Sitting-balance support: Feet supported Sitting balance-Leahy Scale: Fair     Standing balance support: Bilateral upper extremity supported, During functional activity, Reliant on assistive device for balance Standing balance-Leahy Scale: Poor                                Pertinent Vitals/Pain Pain Assessment Pain Assessment: Faces Faces Pain Scale: Hurts a little bit Pain Location: B LEs to touch Pain Descriptors / Indicators: Burning, Tightness, Throbbing Pain Intervention(s): Limited activity within patient's tolerance, Monitored during session    Home Living Family/patient expects to be discharged to:: Assisted living                 Home Equipment: Agricultural consultant (2 wheels);Wheelchair - manual Additional Comments: pt appears to be a poor historian and no family present at eval    Prior Function Prior Level of Function : Patient poor historian/Family not available;History of Falls (last six months);Needs assist       Physical Assist : Mobility (physical);ADLs (physical) Mobility (physical): Transfers;Gait;Stairs ADLs (physical): Grooming;Bathing;Dressing;Toileting;IADLs Mobility Comments: pt reports she was able to get in and out of her recliner where she was sleeping and amb short distances with rollator to and from the bathroom. pt indicated she needed assist for bathing, dressing and hygiene tasks. pt reports dependent in manual wc for facility navigation       Hand Dominance        Extremity/Trunk Assessment        Lower Extremity Assessment Lower Extremity Assessment: Generalized weakness    Cervical / Trunk Assessment Cervical / Trunk Assessment:  (head forward)  Communication   Communication: HOH  Cognition Arousal/Alertness: Awake/alert Behavior During Therapy: WFL for tasks assessed/performed Overall Cognitive Status: Within Functional Limits for tasks assessed                                          General Comments General comments (skin integrity, edema, etc.): B LE edema, rubor, calor and weeping    Exercises     Assessment/Plan    PT Assessment Patient needs continued PT services  PT Problem List Decreased strength;Decreased range of  motion;Decreased activity tolerance;Decreased balance;Decreased mobility;Decreased coordination;Pain;Decreased safety awareness;Cardiopulmonary status limiting activity       PT Treatment Interventions DME instruction;Gait training;Functional mobility training;Therapeutic activities;Therapeutic exercise;Balance training;Neuromuscular re-education;Patient/family education    PT Goals (Current goals can be found in the Care Plan section)  Acute Rehab PT Goals Patient Stated Goal: to go back to ALF PT Goal Formulation: With patient Time For Goal Achievement: 10/15/22 Potential to Achieve Goals: Good    Frequency Min 1X/week     Co-evaluation               AM-PAC PT "6 Clicks" Mobility  Outcome Measure Help needed turning from your back to your side while in a flat bed without using bedrails?: A Lot Help needed moving from lying on your back to sitting on the side of a flat bed without using bedrails?:  A Lot Help needed moving to and from a bed to a chair (including a wheelchair)?: A Lot Help needed standing up from a chair using your arms (e.g., wheelchair or bedside chair)?: A Lot Help needed to walk in hospital room?: Total Help needed climbing 3-5 steps with a railing? : Total 6 Click Score: 10    End of Session Equipment Utilized During Treatment: Gait belt Activity Tolerance: Patient limited by pain;Patient limited by fatigue;Other (comment) (SOB) Patient left: in chair;with call bell/phone within reach;with chair alarm set Nurse Communication: Mobility status;Other (comment) (pt DOE and desturation) PT Visit Diagnosis: Unsteadiness on feet (R26.81);Other abnormalities of gait and mobility (R26.89);Muscle weakness (generalized) (M62.81);History of falling (Z91.81);Difficulty in walking, not elsewhere classified (R26.2);Pain Pain - Right/Left:  (B) Pain - part of body: Leg;Ankle and joints of foot    Time: 7829-5621 PT Time Calculation (min) (ACUTE ONLY): 26  min   Charges:   PT Evaluation $PT Eval Low Complexity: 1 Low PT Treatments $Therapeutic Activity: 8-22 mins PT General Charges $$ ACUTE PT VISIT: 1 Visit         Johnny Bridge, PT Acute Rehab   Jacqualyn Posey 09/24/2022, 4:06 PM

## 2022-09-24 NOTE — Progress Notes (Signed)
PROGRESS NOTE    Cindy Richmond  NWG:[id number] DOB: 1950-06-24 DOA: 09/23/2022 PCP: Mila Palmer, MD  Brief Narrative: This 72 years old female with PMH significant for A-fib on chronic anticoagulation therapy, history of aortic valve stenosis s/p TAVR, hypertension, hypothyroidism, GERD, morbid obesity, chronic venous stasis presented in the ED for the evaluation of pain,  redness and purulent drainage from bilateral lower extremity ulcers.  Patient has bilateral lower extremity wounds and has noted increased foul-smelling drainage from the wounds.  She also reported chills and undocumented fever at home.  Patient also complains of rash under both breasts which is pruritic.  Patient is admitted for bilateral lower extremity cellulitis.  Assessment & Plan:   Principal Problem:   Sepsis due to cellulitis [facility]) Active Problems:   Acute on chronic diastolic CHF (congestive heart failure) (HCC)   Obesity, morbid (HCC)   Severe aortic stenosis   Atrial fibrillation, persistent (HCC)   Hypertension   Hypothyroidism   Sepsis secondary to bilateral cellulitis: Patient presented with tachycardia, tachypnea, bilateral lower extremity cellulitis and infected wounds with purulent drainage as well as leukocytosis. Empirically started on IV vancomycin and Rocephin. Continue IV fluid resuscitation. Consult wound care for bilateral lower extremity wounds with purulent drainage. Follow-up results of blood cultures   Acute on chronic diastolic CHF: Patient presented with complaints of shortness of breath and wheezing Chest x-ray showed mild interstitial edema and pulmonary vascular congestion. Last 2D Echo from 04/24 showed an LVEF of 55 to 60% Patient received 2 L IV fluid bolus in the ER She was also given a dose of Lasix 20 mg IV once. Continue as needed bronchodilator therapy. Continue diuretic therapy in a.m.   Hypothyroidism: Continue Synthroid.   Hypertension: Continue  bisoprolol/hydrochlorothiazide.   Atrial fibrillation, persistent (HCC) Continue apixaban as primary prophylaxis for an acute stroke Continue bisoprolol for rate control. HR controlled.   Severe aortic stenosis Status post TAVR Stable.   Obesity, morbid (HCC) BMI 50.26 Complicates overall prognosis and care. Diet and exercise discussed in detail.   DVT prophylaxis:  Code Status: Full code Family Communication: No family at bed side. Disposition Plan:    Status is: Inpatient Remains inpatient appropriate because: Admitted for sepsis secondary to bilateral lower extremity cellulitis with ulcerative changes.    Consultants:  None  Procedures: None  Antimicrobials:  Anti-infectives (From admission, onward)    Start     Dose/Rate Route Frequency Ordered Stop   09/24/22 1500  vancomycin (VANCOREADY) IVPB 1500 mg/300 mL        1,500 mg 150 mL/hr over 120 Minutes Intravenous Every 24 hours 09/24/22 0112     09/24/22 1000  cefTRIAXone (ROCEPHIN) 2 g in sodium chloride 0.9 % 100 mL IVPB        2 g 200 mL/hr over 30 Minutes Intravenous Every 24 hours 09/24/22 0813     09/23/22 2200  cefTRIAXone (ROCEPHIN) 1 g in sodium chloride 0.9 % 100 mL IVPB  Status:  Discontinued        1 g 200 mL/hr over 30 Minutes Intravenous Every 24 hours 09/23/22 1537 09/24/22 0813   09/23/22 1545  vancomycin (VANCOCIN) IVPB 1000 mg/200 mL premix  Status:  Discontinued        1,000 mg 200 mL/hr over 60 Minutes Intravenous  Once 09/23/22 1536 09/23/22 1536   09/23/22 1515  vancomycin (VANCOCIN) IVPB 1000 mg/200 mL premix        1,000 mg 200 mL/hr over 60 Minutes Intravenous  Once  09/23/22 1505 09/23/22 1636   09/23/22 1400  vancomycin (VANCOREADY) IVPB 2000 mg/400 mL  Status:  Discontinued        2,000 mg 200 mL/hr over 120 Minutes Intravenous  Once 09/23/22 1353 09/23/22 1505   09/23/22 1345  ceFEPIme (MAXIPIME) 2 g in sodium chloride 0.9 % 100 mL IVPB        2 g 200 mL/hr over 30 Minutes  Intravenous  Once 09/23/22 1342 09/23/22 1510   09/23/22 1330  vancomycin (VANCOCIN) IVPB 1000 mg/200 mL premix  Status:  Discontinued        1,000 mg 200 mL/hr over 60 Minutes Intravenous  Once 09/23/22 1320 09/23/22 1526   09/23/22 1330  aztreonam (AZACTAM) 2 g in sodium chloride 0.9 % 100 mL IVPB  Status:  Discontinued        2 g 200 mL/hr over 30 Minutes Intravenous  Once 09/23/22 1320 09/23/22 1342      Subjective: Patient was seen and examined at bedside.  Overnight events noted. Patient reports redness and oozing from both lower extremities. She also reports feeling chills and had fever at home.  Objective: Vitals:   09/24/22 0229 09/24/22 0334 09/24/22 0548 09/24/22 0549  BP: 126/68  122/77 122/77  Pulse: (!) 109  (!) 102 96  Resp: 20  (!) 21 20  Temp: 97.9 F (36.6 C)  97.6 F (36.4 C) 97.6 F (36.4 C)  TempSrc: Oral  Oral Oral  SpO2: 92%  92% 93%  Weight:      Height:  5\' 4"  (1.626 m)      Intake/Output Summary (Last 24 hours) at 09/24/2022 1147 Last data filed at 09/24/2022 0900 Gross per 24 hour  Intake 4000 ml  Output 550 ml  Net 3450 ml   Filed Weights   09/23/22 1351  Weight: (!) 137 kg    Examination:  General exam: Appears calm and comfortable , Morbidly obese, deconditioned. Respiratory system: CTA bilaterally. Respiratory effort normal. RR 15 Cardiovascular system: S1 & S2 heard, RRR. No JVD, murmurs, rubs, gallops or clicks.  Gastrointestinal system: Abdomen is nondistended, soft and non tender. Normal bowel sounds heard. Central nervous system: Alert and oriented x 3. No focal neurological deficits. Extremities: Venous stasis ulceration in both lower extremities with oozing Skin: No rashes, lesions or ulcers Psychiatry: Judgement and insight appear normal. Mood & affect appropriate.     Data Reviewed: I have personally reviewed following labs and imaging studies  CBC: Recent Labs  Lab 09/23/22 1310 09/24/22 0458  WBC 11.7* 9.4   NEUTROABS 9.2*  --   HGB 11.2* 10.1*  HCT 39.1 35.0*  MCV 78.5* 77.3*  PLT 183 150   Basic Metabolic Panel: Recent Labs  Lab 09/23/22 1310 09/24/22 0458  NA 138 137  K 4.2 3.5  CL 97* 98  CO2 29 30  GLUCOSE 115* 106*  BUN 21 19  CREATININE 0.86 0.79  CALCIUM 9.1 8.7*   GFR: Estimated Creatinine Clearance: 85.3 mL/min (by C-G formula based on SCr of 0.79 mg/dL). Liver Function Tests: Recent Labs  Lab 09/23/22 1310  AST 14*  ALT 8  ALKPHOS 95  BILITOT 0.7  PROT 7.1  ALBUMIN 3.4*   No results for input(s): "LIPASE", "AMYLASE" in the last 168 hours. No results for input(s): "AMMONIA" in the last 168 hours. Coagulation Profile: Recent Labs  Lab 09/23/22 1310 09/24/22 0458  INR 1.5* 1.6*   Cardiac Enzymes: No results for input(s): "CKTOTAL", "CKMB", "CKMBINDEX", "TROPONINI" in the last 168  hours. BNP (last 3 results) No results for input(s): "PROBNP" in the last 8760 hours. HbA1C: No results for input(s): "HGBA1C" in the last 72 hours. CBG: No results for input(s): "GLUCAP" in the last 168 hours. Lipid Profile: No results for input(s): "CHOL", "HDL", "LDLCALC", "TRIG", "CHOLHDL", "LDLDIRECT" in the last 72 hours. Thyroid Function Tests: No results for input(s): "TSH", "T4TOTAL", "FREET4", "T3FREE", "THYROIDAB" in the last 72 hours. Anemia Panel: No results for input(s): "VITAMINB12", "FOLATE", "FERRITIN", "TIBC", "IRON", "RETICCTPCT" in the last 72 hours. Sepsis Labs: Recent Labs  Lab 09/23/22 1419 09/24/22 0458  PROCALCITON  --  <0.10  LATICACIDVEN 1.5  --     Recent Results (from the past 240 hour(s))  Culture, blood (routine x 2)     Status: None (Preliminary result)   Collection Time: 09/23/22  1:00 PM   Specimen: BLOOD LEFT ARM  Result Value Ref Range Status   Specimen Description   Final    BLOOD LEFT ARM Performed at [facility], [address], [place], [place] [address]    Special Requests   Final    BOTTLES DRAWN AEROBIC AND  ANAEROBIC Blood Culture adequate volume Performed at [facility], [address], [place], [place] [address]    Culture   Final    NO GROWTH < 24 HOURS Performed at [facility], [address] [place], [place] [address]    Report Status PENDING  Incomplete  Resp panel by RT-PCR (RSV, Flu A&B, Covid) Anterior Nasal Swab     Status: None   Collection Time: 09/23/22  2:20 PM   Specimen: Anterior Nasal Swab  Result Value Ref Range Status   SARS Coronavirus 2 by RT PCR NEGATIVE NEGATIVE Final    Comment: (NOTE) SARS-CoV-2 target nucleic acids are NOT DETECTED.  The SARS-CoV-2 RNA is generally detectable in upper respiratory specimens during the acute phase of infection. The lowest concentration of SARS-CoV-2 viral copies this assay can detect is 138 copies/mL. A negative result does not preclude SARS-Cov-2 infection and should not be used as the sole basis for treatment or other patient management decisions. A negative result may occur with  improper specimen collection/handling, submission of specimen other than nasopharyngeal swab, presence of viral mutation(s) within the areas targeted by this assay, and inadequate number of viral copies(<138 copies/mL). A negative result must be combined with clinical observations, patient history, and epidemiological information. The expected result is Negative.  Fact Sheet for Patients:  BloggerCourse.com  Fact Sheet for Healthcare Providers:  SeriousBroker.it  This test is no t yet approved or cleared by the [place] FDA and  has been authorized for detection and/or diagnosis of SARS-CoV-2 by FDA under an Emergency Use Authorization (EUA). This EUA will remain  in effect (meaning this test can be used) for the duration of the COVID-19 declaration under Section 564(b)(1) of the Act, 21 U.S.C.section 360bbb-3(b)(1), unless the authorization is terminated  or  revoked sooner.       Influenza A by PCR NEGATIVE NEGATIVE Final   Influenza B by PCR NEGATIVE NEGATIVE Final    Comment: (NOTE) The Xpert Xpress SARS-CoV-2/FLU/RSV plus assay is intended as an aid in the diagnosis of influenza from Nasopharyngeal swab specimens and should not be used as a sole basis for treatment. Nasal washings and aspirates are unacceptable for Xpert Xpress SARS-CoV-2/FLU/RSV testing.  Fact Sheet for Patients: BloggerCourse.com  Fact Sheet for Healthcare Providers: SeriousBroker.it  This test is not yet approved or cleared by  the [employer] and has been authorized for detection and/or diagnosis of SARS-CoV-2 by FDA under an Emergency Use Authorization (EUA). This EUA will remain in effect (meaning this test can be used) for the duration of the COVID-19 declaration under Section 564(b)(1) of the Act, 21 U.S.C. section 360bbb-3(b)(1), unless the authorization is terminated or revoked.     Resp Syncytial Virus by PCR NEGATIVE NEGATIVE Final    Comment: (NOTE) Fact Sheet for Patients: BloggerCourse.com  Fact Sheet for Healthcare Providers: SeriousBroker.it  This test is not yet approved or cleared by the [place] FDA and has been authorized for detection and/or diagnosis of SARS-CoV-2 by FDA under an Emergency Use Authorization (EUA). This EUA will remain in effect (meaning this test can be used) for the duration of the COVID-19 declaration under Section 564(b)(1) of the Act, 21 U.S.C. section 360bbb-3(b)(1), unless the authorization is terminated or revoked.  Performed at [facility], [address] [place], [place] [address]   Culture, blood (routine x 2)     Status: None (Preliminary result)   Collection Time: 09/23/22  7:15 PM   Specimen: BLOOD RIGHT ARM  Result Value Ref Range Status   Specimen Description   Final     BLOOD RIGHT ARM Performed at [facility], [address] [place], [place] [address]    Special Requests   Final    BOTTLES DRAWN AEROBIC ONLY Blood Culture adequate volume Performed at [facility], [address] [place], [place] [address]    Culture   Final    NO GROWTH < 12 HOURS Performed at [facility], [address] [place], [place] [address]    Report Status PENDING  Incomplete    Radiology Studies: DG Chest Port 1 View  Result Date: 09/23/2022 CLINICAL DATA:  Short of breath EXAM: PORTABLE CHEST 1 VIEW COMPARISON:  Prior chest x-ray 04/28/2022 FINDINGS: Stable cardiomegaly. Evidence of prior trans arterial aortic valve replacement. Pulmonary vascular congestion with diffuse mild interstitial prominence is similar compared to prior. Low inspiratory volumes. No pneumothorax. No airspace infiltrate. Left reverse shoulder arthroplasty. IMPRESSION: 1. Pulmonary vascular congestion with mild interstitial edema, similar compared to prior. 2. Stable mild cardiomegaly with evidence of prior TAVR. Electronically Signed   By: Malachy Moan M.D.   On: 09/23/2022 14:23    Scheduled Meds:  apixaban  5 mg Oral BID   vitamin C  250 mg Oral QHS   bisoprolol  5 mg Oral Daily   And   hydrochlorothiazide  6.25 mg Oral Daily   carbidopa-levodopa  1 tablet Oral 2 times per day   carbidopa-levodopa  2 tablet Oral 2 times per day   vitamin B-12  1,000 mcg Oral Daily   furosemide  40 mg Oral Daily   levothyroxine  75 mcg Oral Q0600   multivitamin  1 tablet Oral BID   pantoprazole  40 mg Oral Daily   potassium chloride SA  40 mEq Oral Daily   Continuous Infusions:  cefTRIAXone (ROCEPHIN)  IV 2 g (09/24/22 0950)   vancomycin       LOS: 1 day    Time spent: 50 mins    Willeen Niece, MD [facility]   If 7PM-7AM, please contact night-coverage

## 2022-09-24 NOTE — Plan of Care (Signed)
  Problem: Clinical Measurements: Goal: Diagnostic test results will improve Outcome: Progressing   Problem: Respiratory: Goal: Ability to maintain adequate ventilation will improve Outcome: Progressing   Problem: Clinical Measurements: Goal: Diagnostic test results will improve Outcome: Progressing

## 2022-09-24 NOTE — Consult Note (Signed)
WOC Nurse Consult Note: Reason for Consult: bilateral LE wounds and rash under the breast Patient with history of LE edema; self reports she had "wraps" on her legs that were started a few days ago in the facility. She presents now with worsening SOB, acute cellulitis and wounds of the bilateral LEs Wound type: Venous stasis ulceration; RLE; lateral 12cm x 2cm x 0.1cm area of scattered partial thickness skin loss; clean and pink Venous stasis ulceration; LLE; medial 5cm x 3cm x 0.2cm; macerated, grey slough Venous stasis ulceration; LLE posterior calf; 2cm x 2cm area of small areas of skin loss ICD; irritant contact dermatitis; inflammatory; bilateral; redness with satellite lesions consistent with fungal over growth ICD-10 CM Codes for Irritant Dermatitis L30.4  - Erythema intertrigo. Also used for abrasion of the hand, chafing of the skin, dermatitis due to sweating and friction, friction dermatitis, friction eczema, and genital/thigh intertrigo.   Pressure Injury POA: NA Measurement: see above  Wound bed:see above  Drainage (amount, consistency, odor) serosanguinous for the most part, the LLE medial calf has mild musty odor  Periwound: redness bilaterally  Dressing procedure/placement/frequency: Silver hydrofiber to the LLE wounds for exudate management and treat bioburdan Single layer of xeroform to the RLE lateral calf. Top each with ABD pads, secure with kerlix, wrapping from the toes to the knee in order to be therapeutic, followed by 6" ACE wraps from toes to knees.   Spoke with hospitalist, would benefit from therapeutic compression like Unna's boots however with her SOB I am fearful to restart that until she is somewhat more stabilized in her respiratory status  Added antimicrobial wicking fabric to treat ICD under her breast.   Discussed POC with patient and bedside nurse.  Re consult if needed, will not follow at this time. Thanks  Azyriah Nevins [employer], RN,CWOCN, CNS, CWON-AP  [phone])

## 2022-09-24 NOTE — Consult Note (Signed)
I have placed a request via Secure Chat to Dr. Leslye Peer requesting photos of the wound areas of concern to be placed in the EMR.     Limited WOC nursing coverage today, telehealth consultation pending.   Alyviah Crandle [facility], CNS, [employer] [phone]

## 2022-09-24 NOTE — Progress Notes (Signed)
Patient up in the chair and requesting to go back to the bed. RN and NT with the walker tried to assist Patient to stand and Patient unable to push off the chair and stand up. Patient's feet sliding out. Steady attempted but Patient unable to stand up. With 4 person assist Patient placed back to bed.

## 2022-09-25 DIAGNOSIS — L0390 Cellulitis, unspecified: Secondary | ICD-10-CM | POA: Diagnosis not present

## 2022-09-25 DIAGNOSIS — A419 Sepsis, unspecified organism: Secondary | ICD-10-CM | POA: Diagnosis not present

## 2022-09-25 NOTE — Progress Notes (Signed)
PROGRESS NOTE    Cindy Richmond  KGM:[id number] DOB: [birth year] DOA: 09/23/2022 PCP: Mila Palmer, MD  Brief Narrative: This 72 years old female with PMH significant for A-fib on chronic anticoagulation therapy, history of aortic valve stenosis s/p TAVR, hypertension, hypothyroidism, GERD, morbid obesity, chronic venous stasis presented in the ED for the evaluation of pain,  redness and purulent drainage from bilateral lower extremity ulcers.  Patient has bilateral lower extremity wounds and has noted increased foul-smelling drainage from the wounds.  She also reported chills and undocumented fever at home.  Patient also complains of rash under both breasts which is pruritic.  Patient is admitted for bilateral lower extremity cellulitis.  Assessment & Plan:   Principal Problem:   Sepsis due to cellulitis [facility]) Active Problems:   Acute on chronic diastolic CHF (congestive heart failure) (HCC)   Obesity, morbid (HCC)   Severe aortic stenosis   Atrial fibrillation, persistent (HCC)   Hypertension   Hypothyroidism   Sepsis secondary to bilateral cellulitis: Patient presented with tachycardia, tachypnea, bilateral lower extremity cellulitis and infected wounds with purulent drainage as well as leukocytosis. Empirically started on IV vancomycin and Rocephin. Continue IV fluid resuscitation. Consult wound care for bilateral lower extremity wounds with purulent drainage. Blood cultures No growth so far.   Acute on chronic diastolic CHF: Patient presented with complaints of shortness of breath and wheezing Chest x-ray showed mild interstitial edema and pulmonary vascular congestion. Last 2D Echo from 04/24 showed an LVEF of 55 to 60% Patient received 2 L IV fluid bolus in the ER She was also given a dose of Lasix 20 mg IV once. Continue as needed bronchodilator therapy. Continue diuretic therapy in a.m.   Hypothyroidism: Continue Synthroid.   Hypertension: Continue  bisoprolol/hydrochlorothiazide.   Atrial fibrillation, persistent (HCC) Continue apixaban as primary prophylaxis for an acute stroke Continue bisoprolol for rate control. HR controlled.   Severe aortic stenosis Status post TAVR Stable.   Obesity, morbid (HCC) BMI 50.26 Complicates overall prognosis and care. Diet and exercise discussed in detail.   DVT prophylaxis:  Code Status: Full code Family Communication: No family at bed side. Disposition Plan:    Status is: Inpatient Remains inpatient appropriate because: Admitted for sepsis secondary to bilateral lower extremity cellulitis with ulcerative changes.  Cellulitis is improving.    Consultants:  None  Procedures: None  Antimicrobials:  Anti-infectives (From admission, onward)    Start     Dose/Rate Route Frequency Ordered Stop   09/24/22 1500  vancomycin (VANCOREADY) IVPB 1500 mg/300 mL  Status:  Discontinued        1,500 mg 150 mL/hr over 120 Minutes Intravenous Every 24 hours 09/24/22 0112 09/25/22 1128   09/24/22 1000  cefTRIAXone (ROCEPHIN) 2 g in sodium chloride 0.9 % 100 mL IVPB        2 g 200 mL/hr over 30 Minutes Intravenous Every 24 hours 09/24/22 0813     09/23/22 2200  cefTRIAXone (ROCEPHIN) 1 g in sodium chloride 0.9 % 100 mL IVPB  Status:  Discontinued        1 g 200 mL/hr over 30 Minutes Intravenous Every 24 hours 09/23/22 1537 09/24/22 0813   09/23/22 1545  vancomycin (VANCOCIN) IVPB 1000 mg/200 mL premix  Status:  Discontinued        1,000 mg 200 mL/hr over 60 Minutes Intravenous  Once 09/23/22 1536 09/23/22 1536   09/23/22 1515  vancomycin (VANCOCIN) IVPB 1000 mg/200 mL premix        1,000  mg 200 mL/hr over 60 Minutes Intravenous  Once 09/23/22 1505 09/23/22 1636   09/23/22 1400  vancomycin (VANCOREADY) IVPB 2000 mg/400 mL  Status:  Discontinued        2,000 mg 200 mL/hr over 120 Minutes Intravenous  Once 09/23/22 1353 09/23/22 1505   09/23/22 1345  ceFEPIme (MAXIPIME) 2 g in sodium chloride 0.9  % 100 mL IVPB        2 g 200 mL/hr over 30 Minutes Intravenous  Once 09/23/22 1342 09/23/22 1510   09/23/22 1330  vancomycin (VANCOCIN) IVPB 1000 mg/200 mL premix  Status:  Discontinued        1,000 mg 200 mL/hr over 60 Minutes Intravenous  Once 09/23/22 1320 09/23/22 1526   09/23/22 1330  aztreonam (AZACTAM) 2 g in sodium chloride 0.9 % 100 mL IVPB  Status:  Discontinued        2 g 200 mL/hr over 30 Minutes Intravenous  Once 09/23/22 1320 09/23/22 1342      Subjective: Patient was seen and examined at bedside.  Overnight events noted. Patient reports redness and oozing from both lower extremities. Redness and swelling is improving.  Objective: Vitals:   09/24/22 1309 09/25/22 0011 09/25/22 0650 09/25/22 1210  BP: 121/74 127/66 113/68 125/76  Pulse: (!) 102 92 96 98  Resp: 20 (!) 22 (!) 24   Temp: 98.6 F (37 C) 99.3 F (37.4 C) 98.7 F (37.1 C) 97.8 F (36.6 C)  TempSrc: Oral Oral Oral Oral  SpO2:  97% 98% 94%  Weight:      Height:        Intake/Output Summary (Last 24 hours) at 09/25/2022 1402 Last data filed at 09/25/2022 0830 Gross per 24 hour  Intake 785.06 ml  Output 1450 ml  Net -664.94 ml   Filed Weights   09/23/22 1351  Weight: (!) 137 kg    Examination:  General exam: Appears comfortable, not in any acute distress, morbidly obese, deconditioned. Respiratory system: CTA bilaterally. Respiratory effort normal. RR 13 Cardiovascular system: S1 & S2 heard, Irregular  rhythm, no murmur. Gastrointestinal system: Abdomen is non distended, soft and non tender. Normal bowel sounds heard. Central nervous system: Alert and oriented x 3. No focal neurological deficits. Extremities: Venous stasis ulceration in both lower extremities with oozing Skin: No rashes, lesions or ulcers Psychiatry: Judgement and insight appear normal. Mood & affect appropriate.     Data Reviewed: I have personally reviewed following labs and imaging studies  CBC: Recent Labs  Lab  09/23/22 1310 09/24/22 0458  WBC 11.7* 9.4  NEUTROABS 9.2*  --   HGB 11.2* 10.1*  HCT 39.1 35.0*  MCV 78.5* 77.3*  PLT 183 150   Basic Metabolic Panel: Recent Labs  Lab 09/23/22 1310 09/24/22 0458  NA 138 137  K 4.2 3.5  CL 97* 98  CO2 29 30  GLUCOSE 115* 106*  BUN 21 19  CREATININE 0.86 0.79  CALCIUM 9.1 8.7*   GFR: Estimated Creatinine Clearance: 85.3 mL/min (by C-G formula based on SCr of 0.79 mg/dL). Liver Function Tests: Recent Labs  Lab 09/23/22 1310  AST 14*  ALT 8  ALKPHOS 95  BILITOT 0.7  PROT 7.1  ALBUMIN 3.4*   No results for input(s): "LIPASE", "AMYLASE" in the last 168 hours. No results for input(s): "AMMONIA" in the last 168 hours. Coagulation Profile: Recent Labs  Lab 09/23/22 1310 09/24/22 0458  INR 1.5* 1.6*   Cardiac Enzymes: No results for input(s): "CKTOTAL", "CKMB", "CKMBINDEX", "TROPONINI" in  the last 168 hours. BNP (last 3 results) No results for input(s): "PROBNP" in the last 8760 hours. HbA1C: No results for input(s): "HGBA1C" in the last 72 hours. CBG: No results for input(s): "GLUCAP" in the last 168 hours. Lipid Profile: No results for input(s): "CHOL", "HDL", "LDLCALC", "TRIG", "CHOLHDL", "LDLDIRECT" in the last 72 hours. Thyroid Function Tests: No results for input(s): "TSH", "T4TOTAL", "FREET4", "T3FREE", "THYROIDAB" in the last 72 hours. Anemia Panel: No results for input(s): "VITAMINB12", "FOLATE", "FERRITIN", "TIBC", "IRON", "RETICCTPCT" in the last 72 hours. Sepsis Labs: Recent Labs  Lab 09/23/22 1419 09/24/22 0458  PROCALCITON  --  <0.10  LATICACIDVEN 1.5  --     Recent Results (from the past 240 hour(s))  Culture, blood (routine x 2)     Status: None (Preliminary result)   Collection Time: 09/23/22  1:00 PM   Specimen: BLOOD LEFT ARM  Result Value Ref Range Status   Specimen Description   Final    BLOOD LEFT ARM Performed at [facility], [address] [place], [place] [address]    Special  Requests   Final    BOTTLES DRAWN AEROBIC AND ANAEROBIC Blood Culture adequate volume Performed at [facility], [address] [place], [place] [address]    Culture   Final    NO GROWTH 2 DAYS Performed at [facility], [address], [place], [place] [address]    Report Status PENDING  Incomplete  Resp panel by RT-PCR (RSV, Flu A&B, Covid) Anterior Nasal Swab     Status: None   Collection Time: 09/23/22  2:20 PM   Specimen: Anterior Nasal Swab  Result Value Ref Range Status   SARS Coronavirus 2 by RT PCR NEGATIVE NEGATIVE Final    Comment: (NOTE) SARS-CoV-2 target nucleic acids are NOT DETECTED.  The SARS-CoV-2 RNA is generally detectable in upper respiratory specimens during the acute phase of infection. The lowest concentration of SARS-CoV-2 viral copies this assay can detect is 138 copies/mL. A negative result does not preclude SARS-Cov-2 infection and should not be used as the sole basis for treatment or other patient management decisions. A negative result may occur with  improper specimen collection/handling, submission of specimen other than nasopharyngeal swab, presence of viral mutation(s) within the areas targeted by this assay, and inadequate number of viral copies(<138 copies/mL). A negative result must be combined with clinical observations, patient history, and epidemiological information. The expected result is Negative.  Fact Sheet for Patients:  BloggerCourse.com  Fact Sheet for Healthcare Providers:  SeriousBroker.it  This test is no t yet approved or cleared by the [place] FDA and  has been authorized for detection and/or diagnosis of SARS-CoV-2 by FDA under an Emergency Use Authorization (EUA). This EUA will remain  in effect (meaning this test can be used) for the duration of the COVID-19 declaration under Section 564(b)(1) of the Act, 21 U.S.C.section 360bbb-3(b)(1),  unless the authorization is terminated  or revoked sooner.       Influenza A by PCR NEGATIVE NEGATIVE Final   Influenza B by PCR NEGATIVE NEGATIVE Final    Comment: (NOTE) The Xpert Xpress SARS-CoV-2/FLU/RSV plus assay is intended as an aid in the diagnosis of influenza from Nasopharyngeal swab specimens and should not be used as a sole basis for treatment. Nasal washings and aspirates are unacceptable for Xpert Xpress SARS-CoV-2/FLU/RSV testing.  Fact Sheet for Patients: BloggerCourse.com  Fact Sheet for Healthcare Providers: SeriousBroker.it  This test is not yet approved or  cleared by the [place] and has been authorized for detection and/or diagnosis of SARS-CoV-2 by FDA under an Emergency Use Authorization (EUA). This EUA will remain in effect (meaning this test can be used) for the duration of the COVID-19 declaration under Section 564(b)(1) of the Act, 21 U.S.C. section 360bbb-3(b)(1), unless the authorization is terminated or revoked.     Resp Syncytial Virus by PCR NEGATIVE NEGATIVE Final    Comment: (NOTE) Fact Sheet for Patients: BloggerCourse.com  Fact Sheet for Healthcare Providers: SeriousBroker.it  This test is not yet approved or cleared by the [place] FDA and has been authorized for detection and/or diagnosis of SARS-CoV-2 by FDA under an Emergency Use Authorization (EUA). This EUA will remain in effect (meaning this test can be used) for the duration of the COVID-19 declaration under Section 564(b)(1) of the Act, 21 U.S.C. section 360bbb-3(b)(1), unless the authorization is terminated or revoked.  Performed at [facility], [address], [place], [place] [address]   Culture, blood (routine x 2)     Status: None (Preliminary result)   Collection Time: 09/23/22  7:15 PM   Specimen: BLOOD RIGHT ARM  Result Value Ref  Range Status   Specimen Description   Final    BLOOD RIGHT ARM Performed at [facility], [address] [place], [place] [address]    Special Requests   Final    BOTTLES DRAWN AEROBIC ONLY Blood Culture adequate volume Performed at [facility], [address], [place], [place] [address]    Culture   Final    NO GROWTH 2 DAYS Performed at [facility], [address], [place], [place] [address]    Report Status PENDING  Incomplete    Radiology Studies: No results found.  Scheduled Meds:  apixaban  5 mg Oral BID   vitamin C  250 mg Oral QHS   bisoprolol  5 mg Oral Daily   And   hydrochlorothiazide  6.25 mg Oral Daily   carbidopa-levodopa  1 tablet Oral 2 times per day   carbidopa-levodopa  2 tablet Oral 2 times per day   vitamin B-12  1,000 mcg Oral Daily   furosemide  40 mg Oral Daily   levothyroxine  75 mcg Oral Q0600   multivitamin  1 tablet Oral BID   pantoprazole  40 mg Oral Daily   potassium chloride SA  40 mEq Oral Daily   Continuous Infusions:  cefTRIAXone (ROCEPHIN)  IV 2 g (09/25/22 0843)     LOS: 2 days    Time spent: 35 mins    Willeen Niece, MD [facility]   If 7PM-7AM, please contact night-coverage

## 2022-09-25 NOTE — Progress Notes (Signed)
Physical Therapy Treatment Patient Details Name: Cindy Richmond MRN: [id number] DOB: March 06, 1950 Today's Date: 09/25/2022   History of Present Illness 72 yo female presented to ED on 09/23/2022 due to B LE pain and foul-smelling drainage from LE wounds. Pt reports chills and feeling feverish. Pt was tachycardic and tachyphemic with abn labs including elevated WBC count. Pt was found to have sepsis secondary to B LE cellulitis with ulcerative changes. Pt started on IV ABX and fluid. Pt PMH includes but is not limited to: dCHF, A-fib, HTN, hypothyroidism, aortic stenosis, HLD, PD, s/p TAVR, OSA, L reverse TSA, and HOH.    PT Comments  General Comments: AxO x 3 pleasant Lady.  Slight anxiety about walking/fear of falling. Pt in bed on 2 lts sats 96%.  Removed O2 trial RA.  General bed mobility comments: required much assist to transfer from supine to EOB due to body habitus.  Pt stated she sleeps in her recliner. General transfer comment: pt self able to rise from elevated bed using forward weight shift momentum. General Gait Details: pt tolerated amb 16 feet using her personal Rollator walker while monitoring RA which avg 93% and HR 112.  Distance limited by effort/fatigue/deconditioning.  + 2 assist for safety such that recliner was following. Left pt in recliner on RA @ 93%. Pt appears at her prior level of mobility and plans to return to her ALF.    If plan is discharge home, recommend the following: Two people to help with walking and/or transfers;Two people to help with bathing/dressing/bathroom;Assistance with cooking/housework;Direct supervision/assist for medications management;Assist for transportation;Help with stairs or ramp for entrance   Can travel by private vehicle        Equipment Recommendations       Recommendations for Other Services       Precautions / Restrictions Precautions Precautions: Fall Restrictions Weight Bearing Restrictions: No     Mobility  Bed  Mobility Overal bed mobility: Needs Assistance Bed Mobility: Supine to Sit     Supine to sit: Mod assist, HOB elevated     General bed mobility comments: required much assist to transfer from supine to EOB due to body habitus.  Pt stated she sleeps in her recliner.    Transfers Overall transfer level: Needs assistance Equipment used: Rollator (4 wheels)   Sit to Stand: From elevated surface, Min guard, Supervision           General transfer comment: pt self able to rise from elevated bed using forward weight shift momentum.    Ambulation/Gait Ambulation/Gait assistance: Supervision, Min guard Gait Distance (Feet): 16 Feet Assistive device: Rollator (4 wheels) Gait Pattern/deviations: Step-to pattern, Wide base of support Gait velocity: decreased     General Gait Details: pt tolerated amb 16 feet using her personal Rollator walker while monitoring RA which avg 93% and HR 112.  Distance limited by effort/fatigue/deconditioning.  + 2 assist for safety such that recliner was following.   Stairs             Wheelchair Mobility     Tilt Bed    Modified Rankin (Stroke Patients Only)       Balance                                            Cognition Arousal/Alertness: Awake/alert Behavior During Therapy: WFL for tasks assessed/performed Overall Cognitive Status: Within Functional Limits  for tasks assessed                                 General Comments: AxO x 3 pleasant Lady.  Slight anxiety about walking/fear of falling.        Exercises      General Comments        Pertinent Vitals/Pain Pain Assessment Pain Assessment: Faces Faces Pain Scale: Hurts a little bit Pain Location: B LEs to touch Pain Descriptors / Indicators: Burning, Tightness, Throbbing Pain Intervention(s): Monitored during session, Repositioned    Home Living                          Prior Function            PT Goals  (current goals can now be found in the care plan section) Progress towards PT goals: Progressing toward goals    Frequency    Min 1X/week      PT Plan Current plan remains appropriate    Co-evaluation              AM-PAC PT "6 Clicks" Mobility   Outcome Measure  Help needed turning from your back to your side while in a flat bed without using bedrails?: A Lot Help needed moving from lying on your back to sitting on the side of a flat bed without using bedrails?: A Lot Help needed moving to and from a bed to a chair (including a wheelchair)?: A Little Help needed standing up from a chair using your arms (e.g., wheelchair or bedside chair)?: A Little Help needed to walk in hospital room?: A Little Help needed climbing 3-5 steps with a railing? : Total 6 Click Score: 14    End of Session Equipment Utilized During Treatment: Gait belt Activity Tolerance: Patient limited by fatigue;Other (comment) (deconditioned) Patient left: in chair;with call bell/phone within reach;with chair alarm set Nurse Communication: Mobility status;Other (comment) PT Visit Diagnosis: Unsteadiness on feet (R26.81);Other abnormalities of gait and mobility (R26.89);Muscle weakness (generalized) (M62.81);History of falling (Z91.81);Difficulty in walking, not elsewhere classified (R26.2);Pain Pain - part of body: Leg;Ankle and joints of foot     Time: 1410-1436 PT Time Calculation (min) (ACUTE ONLY): 26 min  Charges:    $Gait Training: 8-22 mins $Therapeutic Activity: 8-22 mins PT General Charges $$ ACUTE PT VISIT: 1 Visit                     Felecia Shelling  PTA Acute  Rehabilitation Services Office M-F          [phone]

## 2022-09-25 NOTE — Plan of Care (Signed)

## 2022-09-26 DIAGNOSIS — L0390 Cellulitis, unspecified: Secondary | ICD-10-CM | POA: Diagnosis not present

## 2022-09-26 DIAGNOSIS — A419 Sepsis, unspecified organism: Secondary | ICD-10-CM | POA: Diagnosis not present

## 2022-09-26 NOTE — Plan of Care (Signed)
  Problem: Clinical Measurements: Goal: Diagnostic test results will improve Outcome: Progressing   Problem: Activity: Goal: Risk for activity intolerance will decrease Outcome: Progressing   Problem: Nutrition: Goal: Adequate nutrition will be maintained Outcome: Progressing   

## 2022-09-26 NOTE — Care Management Important Message (Signed)
Important Message  Patient Details IM Letter given. Name: Cindy Richmond MRN: [id number] Date of Birth: [birth year]   Medicare Important Message Given:  Yes     Cindy Richmond 09/26/2022, 1:28 PM

## 2022-09-26 NOTE — Progress Notes (Signed)
PROGRESS NOTE    Cindy Richmond  XLK:[id number] DOB: August 07, 1950 DOA: 09/23/2022 PCP: Mila Palmer, MD  Brief Narrative: This 72 years old female with PMH significant for A-fib on chronic anticoagulation therapy, history of aortic valve stenosis s/p TAVR, hypertension, hypothyroidism, GERD, morbid obesity, chronic venous stasis presented in the ED for the evaluation of pain,  redness and purulent drainage from bilateral lower extremity ulcers.  Patient has bilateral lower extremity wounds and has noted increased foul-smelling drainage from the wounds.  She also reported chills and undocumented fever at home.  Patient also complains of rash under both breasts which is pruritic.  Patient is admitted for bilateral lower extremity cellulitis.  Assessment & Plan:   Principal Problem:   Sepsis due to cellulitis [facility]) Active Problems:   Acute on chronic diastolic CHF (congestive heart failure) (HCC)   Obesity, morbid (HCC)   Severe aortic stenosis   Atrial fibrillation, persistent (HCC)   Hypertension   Hypothyroidism  Sepsis secondary to bilateral cellulitis: Patient presented with tachycardia, tachypnea, bilateral lower extremity cellulitis and infected wounds with purulent drainage as well as leukocytosis. Empirically started on IV vancomycin and Rocephin. Continue IV fluid resuscitation. Consult wound care for bilateral lower extremity wounds with purulent drainage. Blood cultures No growth so far. Antibiotics de-escalated to ceftriaxone.   Acute on chronic diastolic CHF: Patient presented with complaints of shortness of breath and wheezing. Chest x-ray showed mild interstitial edema and pulmonary vascular congestion. Last 2D Echo from 04/24 showed an LVEF of 55 to 60% Patient received 2 L IV fluid bolus in the ER She was also given a dose of Lasix 20 mg IV once. Continue as needed bronchodilator therapy. Continue diuretic therapy in a.m.   Hypothyroidism: Continue Synthroid.    Hypertension: Continue bisoprolol/hydrochlorothiazide.   Atrial fibrillation, persistent (HCC) Continue apixaban as primary prophylaxis for an acute stroke Continue bisoprolol for rate control. HR controlled.   Severe aortic stenosis Status post TAVR Stable.   Obesity, morbid (HCC) BMI 50.26 Complicates overall prognosis and care. Diet and exercise discussed in detail.   DVT prophylaxis:  Code Status: Full code Family Communication: No family at bed side. Disposition Plan:    Status is: Inpatient Remains inpatient appropriate because: Admitted for sepsis secondary to bilateral lower extremity cellulitis with ulcerative changes.  Cellulitis is improving. PT and  OT recommended SNF.  Awaiting insurance authorization.  Consultants:  None  Procedures: None  Antimicrobials:  Anti-infectives (From admission, onward)    Start     Dose/Rate Route Frequency Ordered Stop   09/24/22 1500  vancomycin (VANCOREADY) IVPB 1500 mg/300 mL  Status:  Discontinued        1,500 mg 150 mL/hr over 120 Minutes Intravenous Every 24 hours 09/24/22 0112 09/25/22 1128   09/24/22 1000  cefTRIAXone (ROCEPHIN) 2 g in sodium chloride 0.9 % 100 mL IVPB        2 g 200 mL/hr over 30 Minutes Intravenous Every 24 hours 09/24/22 0813     09/23/22 2200  cefTRIAXone (ROCEPHIN) 1 g in sodium chloride 0.9 % 100 mL IVPB  Status:  Discontinued        1 g 200 mL/hr over 30 Minutes Intravenous Every 24 hours 09/23/22 1537 09/24/22 0813   09/23/22 1545  vancomycin (VANCOCIN) IVPB 1000 mg/200 mL premix  Status:  Discontinued        1,000 mg 200 mL/hr over 60 Minutes Intravenous  Once 09/23/22 1536 09/23/22 1536   09/23/22 1515  vancomycin (VANCOCIN) IVPB 1000  mg/200 mL premix        1,000 mg 200 mL/hr over 60 Minutes Intravenous  Once 09/23/22 1505 09/23/22 1636   09/23/22 1400  vancomycin (VANCOREADY) IVPB 2000 mg/400 mL  Status:  Discontinued        2,000 mg 200 mL/hr over 120 Minutes Intravenous  Once  09/23/22 1353 09/23/22 1505   09/23/22 1345  ceFEPIme (MAXIPIME) 2 g in sodium chloride 0.9 % 100 mL IVPB        2 g 200 mL/hr over 30 Minutes Intravenous  Once 09/23/22 1342 09/23/22 1510   09/23/22 1330  vancomycin (VANCOCIN) IVPB 1000 mg/200 mL premix  Status:  Discontinued        1,000 mg 200 mL/hr over 60 Minutes Intravenous  Once 09/23/22 1320 09/23/22 1526   09/23/22 1330  aztreonam (AZACTAM) 2 g in sodium chloride 0.9 % 100 mL IVPB  Status:  Discontinued        2 g 200 mL/hr over 30 Minutes Intravenous  Once 09/23/22 1320 09/23/22 1342      Subjective: Patient was seen and examined at bedside.  Overnight events noted. Patient reports doing much better.  Redness and oozing from the  lower extremities is improving.  Objective: Vitals:   09/25/22 0650 09/25/22 1210 09/25/22 2024 09/26/22 0436  BP: 113/68 125/76 102/62 126/82  Pulse: 96 98 88 90  Resp: (!) 24  20 20   Temp: 98.7 F (37.1 C) 97.8 F (36.6 C) 97.8 F (36.6 C) 98.1 F (36.7 C)  TempSrc: Oral Oral Oral Oral  SpO2: 98% 94% 95% 92%  Weight:      Height:        Intake/Output Summary (Last 24 hours) at 09/26/2022 1216 Last data filed at 09/26/2022 0500 Gross per 24 hour  Intake 580 ml  Output 1300 ml  Net -720 ml   Filed Weights   09/23/22 1351  Weight: (!) 137 kg    Examination:  General exam: Appears comfortable, not in any acute distress, morbidly obese, deconditioned. Respiratory system: CTA bilaterally. Respiratory effort normal. RR 14 Cardiovascular system: S1 & S2 heard, Irregular  rhythm, no murmur. Gastrointestinal system: Abdomen is non distended, soft and non tender. Normal bowel sounds heard. Central nervous system: Alert and oriented x 3. No focal neurological deficits. Extremities: Venous stasis ulceration in both lower extremities with oozing Skin: No rashes, lesions or ulcers Psychiatry: Judgement and insight appear normal. Mood & affect appropriate.     Data Reviewed: I have  personally reviewed following labs and imaging studies  CBC: Recent Labs  Lab 09/23/22 1310 09/24/22 0458  WBC 11.7* 9.4  NEUTROABS 9.2*  --   HGB 11.2* 10.1*  HCT 39.1 35.0*  MCV 78.5* 77.3*  PLT 183 150   Basic Metabolic Panel: Recent Labs  Lab 09/23/22 1310 09/24/22 0458  NA 138 137  K 4.2 3.5  CL 97* 98  CO2 29 30  GLUCOSE 115* 106*  BUN 21 19  CREATININE 0.86 0.79  CALCIUM 9.1 8.7*   GFR: Estimated Creatinine Clearance: 85.3 mL/min (by C-G formula based on SCr of 0.79 mg/dL). Liver Function Tests: Recent Labs  Lab 09/23/22 1310  AST 14*  ALT 8  ALKPHOS 95  BILITOT 0.7  PROT 7.1  ALBUMIN 3.4*   No results for input(s): "LIPASE", "AMYLASE" in the last 168 hours. No results for input(s): "AMMONIA" in the last 168 hours. Coagulation Profile: Recent Labs  Lab 09/23/22 1310 09/24/22 0458  INR 1.5* 1.6*  Cardiac Enzymes: No results for input(s): "CKTOTAL", "CKMB", "CKMBINDEX", "TROPONINI" in the last 168 hours. BNP (last 3 results) No results for input(s): "PROBNP" in the last 8760 hours. HbA1C: No results for input(s): "HGBA1C" in the last 72 hours. CBG: No results for input(s): "GLUCAP" in the last 168 hours. Lipid Profile: No results for input(s): "CHOL", "HDL", "LDLCALC", "TRIG", "CHOLHDL", "LDLDIRECT" in the last 72 hours. Thyroid Function Tests: No results for input(s): "TSH", "T4TOTAL", "FREET4", "T3FREE", "THYROIDAB" in the last 72 hours. Anemia Panel: No results for input(s): "VITAMINB12", "FOLATE", "FERRITIN", "TIBC", "IRON", "RETICCTPCT" in the last 72 hours. Sepsis Labs: Recent Labs  Lab 09/23/22 1419 09/24/22 0458  PROCALCITON  --  <0.10  LATICACIDVEN 1.5  --     Recent Results (from the past 240 hour(s))  Culture, blood (routine x 2)     Status: None (Preliminary result)   Collection Time: 09/23/22  1:00 PM   Specimen: BLOOD LEFT ARM  Result Value Ref Range Status   Specimen Description   Final    BLOOD LEFT ARM Performed at  [facility], [address] [place], [place] [address]    Special Requests   Final    BOTTLES DRAWN AEROBIC AND ANAEROBIC Blood Culture adequate volume Performed at [facility], [address], [place], [place] [address]    Culture   Final    NO GROWTH 3 DAYS Performed at [facility], [address] [place], [place] [address]    Report Status PENDING  Incomplete  Resp panel by RT-PCR (RSV, Flu A&B, Covid) Anterior Nasal Swab     Status: None   Collection Time: 09/23/22  2:20 PM   Specimen: Anterior Nasal Swab  Result Value Ref Range Status   SARS Coronavirus 2 by RT PCR NEGATIVE NEGATIVE Final    Comment: (NOTE) SARS-CoV-2 target nucleic acids are NOT DETECTED.  The SARS-CoV-2 RNA is generally detectable in upper respiratory specimens during the acute phase of infection. The lowest concentration of SARS-CoV-2 viral copies this assay can detect is 138 copies/mL. A negative result does not preclude SARS-Cov-2 infection and should not be used as the sole basis for treatment or other patient management decisions. A negative result may occur with  improper specimen collection/handling, submission of specimen other than nasopharyngeal swab, presence of viral mutation(s) within the areas targeted by this assay, and inadequate number of viral copies(<138 copies/mL). A negative result must be combined with clinical observations, patient history, and epidemiological information. The expected result is Negative.  Fact Sheet for Patients:  BloggerCourse.com  Fact Sheet for Healthcare Providers:  SeriousBroker.it  This test is no t yet approved or cleared by the [place] FDA and  has been authorized for detection and/or diagnosis of SARS-CoV-2 by FDA under an Emergency Use Authorization (EUA). This EUA will remain  in effect (meaning this test can be used) for the duration of the COVID-19  declaration under Section 564(b)(1) of the Act, 21 U.S.C.section 360bbb-3(b)(1), unless the authorization is terminated  or revoked sooner.       Influenza A by PCR NEGATIVE NEGATIVE Final   Influenza B by PCR NEGATIVE NEGATIVE Final    Comment: (NOTE) The Xpert Xpress SARS-CoV-2/FLU/RSV plus assay is intended as an aid in the diagnosis of influenza from Nasopharyngeal swab specimens and should not be used as a sole basis for treatment. Nasal washings and aspirates are unacceptable for Xpert Xpress SARS-CoV-2/FLU/RSV testing.  Fact Sheet for Patients: BloggerCourse.com  Fact Sheet for  Healthcare Providers: SeriousBroker.it  This test is not yet approved or cleared by the [place] and has been authorized for detection and/or diagnosis of SARS-CoV-2 by FDA under an Emergency Use Authorization (EUA). This EUA will remain in effect (meaning this test can be used) for the duration of the COVID-19 declaration under Section 564(b)(1) of the Act, 21 U.S.C. section 360bbb-3(b)(1), unless the authorization is terminated or revoked.     Resp Syncytial Virus by PCR NEGATIVE NEGATIVE Final    Comment: (NOTE) Fact Sheet for Patients: BloggerCourse.com  Fact Sheet for Healthcare Providers: SeriousBroker.it  This test is not yet approved or cleared by the [place] FDA and has been authorized for detection and/or diagnosis of SARS-CoV-2 by FDA under an Emergency Use Authorization (EUA). This EUA will remain in effect (meaning this test can be used) for the duration of the COVID-19 declaration under Section 564(b)(1) of the Act, 21 U.S.C. section 360bbb-3(b)(1), unless the authorization is terminated or revoked.  Performed at [facility], [address], [place], [place] [address]   Culture, blood (routine x 2)     Status: None (Preliminary result)    Collection Time: 09/23/22  7:15 PM   Specimen: BLOOD RIGHT ARM  Result Value Ref Range Status   Specimen Description   Final    BLOOD RIGHT ARM Performed at [facility], [address] [place], [place] [address]    Special Requests   Final    BOTTLES DRAWN AEROBIC ONLY Blood Culture adequate volume Performed at [facility] [address], [place], [place] [address]    Culture   Final    NO GROWTH 3 DAYS Performed at [facility], [address], [place], [place] [address]    Report Status PENDING  Incomplete    Radiology Studies: No results found.  Scheduled Meds:  apixaban  5 mg Oral BID   vitamin C  250 mg Oral QHS   bisoprolol  5 mg Oral Daily   And   hydrochlorothiazide  6.25 mg Oral Daily   carbidopa-levodopa  1 tablet Oral 2 times per day   carbidopa-levodopa  2 tablet Oral 2 times per day   vitamin B-12  1,000 mcg Oral Daily   furosemide  40 mg Oral Daily   levothyroxine  75 mcg Oral Q0600   multivitamin  1 tablet Oral BID   pantoprazole  40 mg Oral Daily   potassium chloride SA  40 mEq Oral Daily   Continuous Infusions:  cefTRIAXone (ROCEPHIN)  IV 2 g (09/26/22 1013)     LOS: 3 days    Time spent: 35 mins    Willeen Niece, MD [facility]   If 7PM-7AM, please contact night-coverage

## 2022-09-26 NOTE — Progress Notes (Signed)
Mobility Specialist - Progress Note   09/26/22 1348  Mobility  Activity Ambulated with assistance in hallway  Level of Assistance Minimal assist, patient does 75% or more  Assistive Device Front wheel walker  Distance Ambulated (ft) 22 ft  Range of Motion/Exercises Active Assistive  Activity Response Tolerated fair  Mobility Referral Yes  $Mobility charge 1 Mobility  Mobility Specialist Start Time (ACUTE ONLY) 1334  Mobility Specialist Stop Time (ACUTE ONLY) 1348  Mobility Specialist Time Calculation (min) (ACUTE ONLY) 14 min   Pt was found on recliner chair and agreeable to ambulate. Was min-A for STS and CG for ambulation. Pt grew very fatigued with session. At EOS returned to recliner chair with all needs met. Chair alarm on and NT in room.  Billey Chang Mobility Specialist

## 2022-09-27 DIAGNOSIS — A419 Sepsis, unspecified organism: Secondary | ICD-10-CM | POA: Diagnosis not present

## 2022-09-27 DIAGNOSIS — L0390 Cellulitis, unspecified: Secondary | ICD-10-CM | POA: Diagnosis not present

## 2022-09-27 NOTE — Progress Notes (Signed)
Mobility Specialist - Progress Note   09/27/22 1023  Mobility  Activity Ambulated with assistance in hallway;Transferred to/from [facility]  Level of Assistance Maximum assist, patient does 25-49%  Assistive Device Front wheel walker  Distance Ambulated (ft) 34 ft  Range of Motion/Exercises Active Assistive  Activity Response Tolerated fair  Mobility Referral Yes  $Mobility charge 1 Mobility  Mobility Specialist Start Time (ACUTE ONLY) 0947  Mobility Specialist Stop Time (ACUTE ONLY) 1023  Mobility Specialist Time Calculation (min) (ACUTE ONLY) 36 min   Pt was found in bed and agreeable to ambulate. Was max-A for bed mobility and mod-A for STS. Pt grew fatigued with ambulation and returned to use BSC. Able to follow cues throughout session and encouraged pursed lip breathing. Assisted pt in pericare and EOS returned to recliner chair with all needs met. Call bell in reach and chair alarm on.   Billey Chang Mobility Specialist

## 2022-09-27 NOTE — Plan of Care (Signed)
  Problem: Clinical Measurements: Goal: Signs and symptoms of infection will decrease Outcome: Progressing   Problem: Education: Goal: Knowledge of General Education information will improve Description: Including pain rating scale, medication(s)/side effects and non-pharmacologic comfort measures Outcome: Progressing   Problem: Clinical Measurements: Goal: Will remain free from infection Outcome: Progressing   Problem: Coping: Goal: Level of anxiety will decrease Outcome: Progressing   Problem: Pain Managment: Goal: General experience of comfort will improve Outcome: Progressing   Problem: Skin Integrity: Goal: Risk for impaired skin integrity will decrease Outcome: Progressing

## 2022-09-27 NOTE — Progress Notes (Signed)
PT Cancellation Note  Patient Details Name: Cindy Richmond MRN: [id number] DOB: August 06, 1950   Cancelled Treatment:    Reason Eval/Treat Not Completed: Fatigue/lethargy limiting ability to participate (pt just got back to bed with nursing and stated she's too tired to do PT at present. Will follow.)  Tamala Ser PT 09/27/2022  Acute Rehabilitation Services  Office [phone]

## 2022-09-27 NOTE — TOC Progression Note (Signed)
Transition of Care [facility]) - Progression Note    Patient Details  Name: Cindy Richmond MRN: [id number] Date of Birth: May 30, 1950  Transition of Care [facility]) CM/SW Contact  Geni Bers, RN Phone Number: 09/27/2022, 3:31 PM  Clinical Narrative:    Spoke with pt concerning discharge plan. A call to [facility] was made, spoke with Lurena Joiner, RN who states that pt will not be able to receive PT and RN for wound care there. Explained this to pt, who agreed with going to SNF. Pt's sister was at bedside and agreed with SNF. Pt was faxed to SNF, waiting bed offers. Will need insurance authorization.    Expected Discharge Plan: Home w Home Health Services Barriers to Discharge: No Barriers Identified  Expected Discharge Plan and Services       Living arrangements for the past 2 months: Assisted Living Facility                                       Social Determinants of Health (SDOH) Interventions SDOH Screenings   Food Insecurity: No Food Insecurity (09/24/2022)  Housing: Low Risk  (09/24/2022)  Transportation Needs: No Transportation Needs (09/24/2022)  Utilities: Not At Risk (09/24/2022)  Financial Resource Strain: Low Risk  (10/18/2021)  Tobacco Use: Low Risk  (09/23/2022)    Readmission Risk Interventions     No data to display

## 2022-09-27 NOTE — Progress Notes (Signed)
PROGRESS NOTE    Cindy Richmond  ZOX:[id number] DOB: [birth year] DOA: 09/23/2022 PCP: Mila Palmer, MD  Brief Narrative: This 72 years old female with PMH significant for A-fib on chronic anticoagulation therapy, history of aortic valve stenosis s/p TAVR, hypertension, hypothyroidism, GERD, morbid obesity, chronic venous stasis presented in the ED for the evaluation of pain,  redness and purulent drainage from bilateral lower extremity ulcers.  Patient has bilateral lower extremity wounds and has noted increased foul-smelling drainage from the wounds.  She also reported chills and undocumented fever at home.  Patient also complains of rash under both breasts which is pruritic.  Patient is admitted for bilateral lower extremity cellulitis.  Assessment & Plan:   Principal Problem:   Sepsis due to cellulitis [facility]) Active Problems:   Acute on chronic diastolic CHF (congestive heart failure) (HCC)   Obesity, morbid (HCC)   Severe aortic stenosis   Atrial fibrillation, persistent (HCC)   Hypertension   Hypothyroidism  Sepsis secondary to bilateral cellulitis: Patient presented with tachycardia, tachypnea, bilateral lower extremity cellulitis and infected wounds with purulent drainage as well as leukocytosis. Empirically started on IV vancomycin and Rocephin. Continue IV fluid resuscitation. Consult wound care for bilateral lower extremity wounds with purulent drainage. Blood cultures No growth so far. Antibiotics de-escalated to ceftriaxone. Sepsis physiology resolved.  Antibiotics can be changed to oral abx at discharge.   Acute on chronic diastolic CHF: Patient presented with complaints of shortness of breath and wheezing. Chest x-ray showed mild interstitial edema and pulmonary vascular congestion. Last 2D Echo from 04/24 showed an LVEF of 55 to 60% Patient received 2 L IV fluid bolus in the ER She was also given a dose of Lasix 20 mg IV once. Continue as needed bronchodilator  therapy. Continue Lasix 40 mg po dialy   Hypothyroidism: Continue Synthroid.   Hypertension: Continue bisoprolol. Hydrochlorothiazide discontinued as she is on lasix.   Atrial fibrillation, persistent (HCC) Continue apixaban as primary prophylaxis for an acute stroke Continue bisoprolol for rate control. HR controlled.   Severe aortic stenosis Status post TAVR Stable.   Obesity, morbid (HCC) BMI 50.26 Complicates overall prognosis and care. Diet and exercise discussed in detail.   DVT prophylaxis:  Code Status: Full code Family Communication: No family at bed side. Disposition Plan:    Status is: Inpatient Remains inpatient appropriate because: Admitted for sepsis secondary to bilateral lower extremity cellulitis with ulcerative changes.  Cellulitis is improving. PT and  OT recommended SNF.  Awaiting insurance authorization.  Consultants:  None  Procedures: None  Antimicrobials:  Anti-infectives (From admission, onward)    Start     Dose/Rate Route Frequency Ordered Stop   09/24/22 1500  vancomycin (VANCOREADY) IVPB 1500 mg/300 mL  Status:  Discontinued        1,500 mg 150 mL/hr over 120 Minutes Intravenous Every 24 hours 09/24/22 0112 09/25/22 1128   09/24/22 1000  cefTRIAXone (ROCEPHIN) 2 g in sodium chloride 0.9 % 100 mL IVPB        2 g 200 mL/hr over 30 Minutes Intravenous Every 24 hours 09/24/22 0813     09/23/22 2200  cefTRIAXone (ROCEPHIN) 1 g in sodium chloride 0.9 % 100 mL IVPB  Status:  Discontinued        1 g 200 mL/hr over 30 Minutes Intravenous Every 24 hours 09/23/22 1537 09/24/22 0813   09/23/22 1545  vancomycin (VANCOCIN) IVPB 1000 mg/200 mL premix  Status:  Discontinued        1,000 mg  200 mL/hr over 60 Minutes Intravenous  Once 09/23/22 1536 09/23/22 1536   09/23/22 1515  vancomycin (VANCOCIN) IVPB 1000 mg/200 mL premix        1,000 mg 200 mL/hr over 60 Minutes Intravenous  Once 09/23/22 1505 09/23/22 1636   09/23/22 1400  vancomycin  (VANCOREADY) IVPB 2000 mg/400 mL  Status:  Discontinued        2,000 mg 200 mL/hr over 120 Minutes Intravenous  Once 09/23/22 1353 09/23/22 1505   09/23/22 1345  ceFEPIme (MAXIPIME) 2 g in sodium chloride 0.9 % 100 mL IVPB        2 g 200 mL/hr over 30 Minutes Intravenous  Once 09/23/22 1342 09/23/22 1510   09/23/22 1330  vancomycin (VANCOCIN) IVPB 1000 mg/200 mL premix  Status:  Discontinued        1,000 mg 200 mL/hr over 60 Minutes Intravenous  Once 09/23/22 1320 09/23/22 1526   09/23/22 1330  aztreonam (AZACTAM) 2 g in sodium chloride 0.9 % 100 mL IVPB  Status:  Discontinued        2 g 200 mL/hr over 30 Minutes Intravenous  Once 09/23/22 1320 09/23/22 1342      Subjective: Patient was seen and examined at bedside.Overnight events noted. Patient reports doing much better.  She was sitting comfortably on the chair.   Redness and oozing from the lower extremities has improved.  Objective: Vitals:   09/26/22 0436 09/26/22 1314 09/26/22 2039 09/27/22 1324  BP: 126/82 (!) 121/57 123/72 (!) 152/95  Pulse: 90 93 90 93  Resp: 20 (!) 24 20 20   Temp: 98.1 F (36.7 C) 98 F (36.7 C) 98.6 F (37 C) 97.9 F (36.6 C)  TempSrc: Oral Oral Oral Oral  SpO2: 92% 94% 94% 92%  Weight:      Height:        Intake/Output Summary (Last 24 hours) at 09/27/2022 1420 Last data filed at 09/26/2022 2359 Gross per 24 hour  Intake 720 ml  Output 850 ml  Net -130 ml   Filed Weights   09/23/22 1351  Weight: (!) 137 kg    Examination:  General exam: Appears comfortable, morbidly obese, deconditioned, not in any acute distress. Respiratory system: CTA bilaterally. Respiratory effort normal. RR 13 Cardiovascular system: S1 & S2 heard, Irregular  rhythm, no murmur. Gastrointestinal system: Abdomen is non distended, soft and non tender. Normal bowel sounds heard. Central nervous system: Alert and oriented x 3. No focal neurological deficits. Extremities: Venous stasis ulceration in both lower  extremities with oozing Skin: No rashes, lesions or ulcers Psychiatry: Judgement and insight appear normal. Mood & affect appropriate.     Data Reviewed: I have personally reviewed following labs and imaging studies  CBC: Recent Labs  Lab 09/23/22 1310 09/24/22 0458  WBC 11.7* 9.4  NEUTROABS 9.2*  --   HGB 11.2* 10.1*  HCT 39.1 35.0*  MCV 78.5* 77.3*  PLT 183 150   Basic Metabolic Panel: Recent Labs  Lab 09/23/22 1310 09/24/22 0458  NA 138 137  K 4.2 3.5  CL 97* 98  CO2 29 30  GLUCOSE 115* 106*  BUN 21 19  CREATININE 0.86 0.79  CALCIUM 9.1 8.7*   GFR: Estimated Creatinine Clearance: 85.3 mL/min (by C-G formula based on SCr of 0.79 mg/dL). Liver Function Tests: Recent Labs  Lab 09/23/22 1310  AST 14*  ALT 8  ALKPHOS 95  BILITOT 0.7  PROT 7.1  ALBUMIN 3.4*   No results for input(s): "LIPASE", "AMYLASE" in the  last 168 hours. No results for input(s): "AMMONIA" in the last 168 hours. Coagulation Profile: Recent Labs  Lab 09/23/22 1310 09/24/22 0458  INR 1.5* 1.6*   Cardiac Enzymes: No results for input(s): "CKTOTAL", "CKMB", "CKMBINDEX", "TROPONINI" in the last 168 hours. BNP (last 3 results) No results for input(s): "PROBNP" in the last 8760 hours. HbA1C: No results for input(s): "HGBA1C" in the last 72 hours. CBG: No results for input(s): "GLUCAP" in the last 168 hours. Lipid Profile: No results for input(s): "CHOL", "HDL", "LDLCALC", "TRIG", "CHOLHDL", "LDLDIRECT" in the last 72 hours. Thyroid Function Tests: No results for input(s): "TSH", "T4TOTAL", "FREET4", "T3FREE", "THYROIDAB" in the last 72 hours. Anemia Panel: No results for input(s): "VITAMINB12", "FOLATE", "FERRITIN", "TIBC", "IRON", "RETICCTPCT" in the last 72 hours. Sepsis Labs: Recent Labs  Lab 09/23/22 1419 09/24/22 0458  PROCALCITON  --  <0.10  LATICACIDVEN 1.5  --     Recent Results (from the past 240 hour(s))  Culture, blood (routine x 2)     Status: None (Preliminary  result)   Collection Time: 09/23/22  1:00 PM   Specimen: BLOOD LEFT ARM  Result Value Ref Range Status   Specimen Description   Final    BLOOD LEFT ARM Performed at [facility], [address], [place], [place] [address]    Special Requests   Final    BOTTLES DRAWN AEROBIC AND ANAEROBIC Blood Culture adequate volume Performed at [facility], [address] [place], [place] [address]    Culture   Final    NO GROWTH 4 DAYS Performed at [facility], [address] [place], [place] [address]    Report Status PENDING  Incomplete  Resp panel by RT-PCR (RSV, Flu A&B, Covid) Anterior Nasal Swab     Status: None   Collection Time: 09/23/22  2:20 PM   Specimen: Anterior Nasal Swab  Result Value Ref Range Status   SARS Coronavirus 2 by RT PCR NEGATIVE NEGATIVE Final    Comment: (NOTE) SARS-CoV-2 target nucleic acids are NOT DETECTED.  The SARS-CoV-2 RNA is generally detectable in upper respiratory specimens during the acute phase of infection. The lowest concentration of SARS-CoV-2 viral copies this assay can detect is 138 copies/mL. A negative result does not preclude SARS-Cov-2 infection and should not be used as the sole basis for treatment or other patient management decisions. A negative result may occur with  improper specimen collection/handling, submission of specimen other than nasopharyngeal swab, presence of viral mutation(s) within the areas targeted by this assay, and inadequate number of viral copies(<138 copies/mL). A negative result must be combined with clinical observations, patient history, and epidemiological information. The expected result is Negative.  Fact Sheet for Patients:  BloggerCourse.com  Fact Sheet for Healthcare Providers:  SeriousBroker.it  This test is no t yet approved or cleared by the [place] FDA and  has been authorized for detection and/or diagnosis of  SARS-CoV-2 by FDA under an Emergency Use Authorization (EUA). This EUA will remain  in effect (meaning this test can be used) for the duration of the COVID-19 declaration under Section 564(b)(1) of the Act, 21 U.S.C.section 360bbb-3(b)(1), unless the authorization is terminated  or revoked sooner.       Influenza A by PCR NEGATIVE NEGATIVE Final   Influenza B by PCR NEGATIVE NEGATIVE Final    Comment: (NOTE) The Xpert Xpress SARS-CoV-2/FLU/RSV plus assay is intended as an aid in the diagnosis of influenza from Nasopharyngeal swab specimens and should not  be used as a sole basis for treatment. Nasal washings and aspirates are unacceptable for Xpert Xpress SARS-CoV-2/FLU/RSV testing.  Fact Sheet for Patients: BloggerCourse.com  Fact Sheet for Healthcare Providers: SeriousBroker.it  This test is not yet approved or cleared by the [place] FDA and has been authorized for detection and/or diagnosis of SARS-CoV-2 by FDA under an Emergency Use Authorization (EUA). This EUA will remain in effect (meaning this test can be used) for the duration of the COVID-19 declaration under Section 564(b)(1) of the Act, 21 U.S.C. section 360bbb-3(b)(1), unless the authorization is terminated or revoked.     Resp Syncytial Virus by PCR NEGATIVE NEGATIVE Final    Comment: (NOTE) Fact Sheet for Patients: BloggerCourse.com  Fact Sheet for Healthcare Providers: SeriousBroker.it  This test is not yet approved or cleared by the [place] FDA and has been authorized for detection and/or diagnosis of SARS-CoV-2 by FDA under an Emergency Use Authorization (EUA). This EUA will remain in effect (meaning this test can be used) for the duration of the COVID-19 declaration under Section 564(b)(1) of the Act, 21 U.S.C. section 360bbb-3(b)(1), unless the authorization is terminated  or revoked.  Performed at [facility], [address], [place], [place] [address]   Culture, blood (routine x 2)     Status: None (Preliminary result)   Collection Time: 09/23/22  7:15 PM   Specimen: BLOOD RIGHT ARM  Result Value Ref Range Status   Specimen Description   Final    BLOOD RIGHT ARM Performed at [facility], [address] [place], [place] [address]    Special Requests   Final    BOTTLES DRAWN AEROBIC ONLY Blood Culture adequate volume Performed at [facility], [address] [place], [place] [address]    Culture   Final    NO GROWTH 4 DAYS Performed at [facility], [address] [place], [place] [address]    Report Status PENDING  Incomplete    Radiology Studies: No results found.  Scheduled Meds:  apixaban  5 mg Oral BID   vitamin C  250 mg Oral QHS   bisoprolol  5 mg Oral Daily   carbidopa-levodopa  1 tablet Oral 2 times per day   carbidopa-levodopa  2 tablet Oral 2 times per day   vitamin B-12  1,000 mcg Oral Daily   furosemide  40 mg Oral Daily   levothyroxine  75 mcg Oral Q0600   multivitamin  1 tablet Oral BID   pantoprazole  40 mg Oral Daily   potassium chloride SA  40 mEq Oral Daily   Continuous Infusions:  cefTRIAXone (ROCEPHIN)  IV 2 g (09/27/22 0837)     LOS: 4 days    Time spent: 35 mins    Willeen Niece, MD [facility]   If 7PM-7AM, please contact night-coverage

## 2022-09-27 NOTE — [place] FL2 (Signed)
[place] MEDICAID FL2 LEVEL OF CARE FORM     IDENTIFICATION  Patient Name: Cindy Richmond Birthdate: 09-29-1950 Sex: female Admission Date (Current Location): 09/23/2022  [facility] and [place] Number:  Producer, television/film/video and Address:         Provider Number: [id number]  Attending Physician Name and Address:  Willeen Niece, MD  Relative Name and Phone Number:  Lekeisha, Arenas [phone]    Chapman,Patty Sister [phone]    Current Level of Care: Hospital Recommended Level of Care: Skilled Nursing Facility Prior Approval Number:    Date Approved/Denied:   PASRR Number: [id number]  Discharge Plan: SNF    Current Diagnoses: Patient Active Problem List   Diagnosis Date Noted   Sepsis due to cellulitis (HCC) 09/23/2022   Hypothyroidism    Hypertension 04/16/2022   Acute on chronic diastolic CHF (congestive heart failure) (HCC) 08/03/2019   S/P TAVR (transcatheter aortic valve replacement) 08/03/2019   Atrial fibrillation, persistent (HCC)    Severe aortic stenosis    Parkinson's disease 07/31/2017   RBD (REM behavioral disorder) 07/31/2017   Obstructive sleep apnea 04/22/2016   Night terrors, adult 04/22/2016   Obesity, morbid (HCC) 04/22/2016    Orientation RESPIRATION BLADDER Height & Weight     Self, Time, Situation, Place  Normal Incontinent Weight: (!) 137 kg Height:  5\' 4"  (162.6 cm)  BEHAVIORAL SYMPTOMS/MOOD NEUROLOGICAL BOWEL NUTRITION STATUS      Incontinent Diet (2 gm Sodium)  AMBULATORY STATUS COMMUNICATION OF NEEDS Skin   Extensive Assist Verbally Other (Comment) (Bilateral legs Venous Stasis Ulcer daily dressing)                       Personal Care Assistance Level of Assistance  Bathing, Feeding, Dressing Bathing Assistance: Maximum assistance Feeding assistance: Limited assistance Dressing Assistance: Maximum assistance     Functional Limitations Info  Sight, Hearing, Speech Sight Info: Impaired Hearing Info: Adequate Speech  Info: Adequate    SPECIAL CARE FACTORS FREQUENCY  PT (By licensed PT), OT (By licensed OT)     PT Frequency: x5 week OT Frequency: x5 week            Contractures Contractures Info: Not present    Additional Factors Info  Code Status, Allergies Code Status Info: FULL Allergies Info: Nickel, Penicillins           Current Medications (09/27/2022):  This is the current hospital active medication list Current Facility-Administered Medications  Medication Dose Route Frequency Provider Last Rate Last Admin   acetaminophen (TYLENOL) tablet 650 mg  650 mg Oral Q6H PRN Agbata, Tochukwu, MD   650 mg at 09/25/22 0510   Or   acetaminophen (TYLENOL) suppository 650 mg  650 mg Rectal Q6H PRN Agbata, Tochukwu, MD       apixaban (ELIQUIS) tablet 5 mg  5 mg Oral BID Agbata, Tochukwu, MD   5 mg at 09/27/22 0818   ascorbic acid (VITAMIN C) tablet 250 mg  250 mg Oral QHS Agbata, Tochukwu, MD   250 mg at 09/26/22 2114   bisoprolol (ZEBETA) tablet 5 mg  5 mg Oral Daily Agbata, Tochukwu, MD   5 mg at 09/27/22 0819   carbidopa-levodopa (SINEMET IR) 25-100 MG per tablet immediate release 1 tablet  1 tablet Oral 2 times per day Agbata, Tochukwu, MD   1 tablet at 09/26/22 2113   carbidopa-levodopa (SINEMET IR) 25-100 MG per tablet immediate release 2 tablet  2 tablet Oral 2 times per day Agbata,  Tochukwu, MD   2 tablet at 09/27/22 1305   cefTRIAXone (ROCEPHIN) 2 g in sodium chloride 0.9 % 100 mL IVPB  2 g Intravenous Q24H Danford Bad, RPH 200 mL/hr at 09/27/22 0837 2 g at 09/27/22 4098   cyanocobalamin (VITAMIN B12) tablet 1,000 mcg  1,000 mcg Oral Daily Agbata, Tochukwu, MD   1,000 mcg at 09/27/22 0819   furosemide (LASIX) tablet 40 mg  40 mg Oral Daily Agbata, Tochukwu, MD   40 mg at 09/27/22 0819   ipratropium-albuterol (DUONEB) 0.5-2.5 (3) MG/3ML nebulizer solution 3 mL  3 mL Nebulization Q6H PRN Agbata, Tochukwu, MD       levothyroxine (SYNTHROID) tablet 75 mcg  75 mcg Oral Q0600 Agbata,  Tochukwu, MD   75 mcg at 09/27/22 1191   multivitamin (PROSIGHT) tablet 1 tablet  1 tablet Oral BID Agbata, Tochukwu, MD   1 tablet at 09/27/22 0819   ondansetron (ZOFRAN) tablet 4 mg  4 mg Oral Q6H PRN Agbata, Tochukwu, MD       Or   ondansetron (ZOFRAN) injection 4 mg  4 mg Intravenous Q6H PRN Agbata, Tochukwu, MD       pantoprazole (PROTONIX) EC tablet 40 mg  40 mg Oral Daily Agbata, Tochukwu, MD   40 mg at 09/27/22 0819   potassium chloride SA (KLOR-CON M) CR tablet 40 mEq  40 mEq Oral Daily Agbata, Tochukwu, MD   40 mEq at 09/27/22 4782     Discharge Medications: Please see discharge summary for a list of discharge medications.  Relevant Imaging Results:  Relevant Lab Results:   Additional Information NF#[id number]  Geni Bers, RN

## 2022-09-28 DIAGNOSIS — L0390 Cellulitis, unspecified: Secondary | ICD-10-CM | POA: Diagnosis not present

## 2022-09-28 DIAGNOSIS — A419 Sepsis, unspecified organism: Secondary | ICD-10-CM | POA: Diagnosis not present

## 2022-09-28 NOTE — Progress Notes (Signed)
PROGRESS NOTE    Cindy Richmond  EXB:[id number] DOB: [birth year] DOA: 09/23/2022 PCP: Mila Palmer, MD  Brief Narrative: This 72 years old female with PMH significant for A-fib on chronic anticoagulation therapy, history of aortic valve stenosis s/p TAVR, hypertension, hypothyroidism, GERD, morbid obesity, chronic venous stasis presented in the ED for the evaluation of pain,  redness and purulent drainage from bilateral lower extremity ulcers.  Patient has bilateral lower extremity wounds and has noted increased foul-smelling drainage from the wounds.  She also reported chills and undocumented fever at home.  Patient also complains of rash under both breasts which is pruritic.  Patient is admitted for bilateral lower extremity cellulitis.  Assessment & Plan:   Principal Problem:   Sepsis due to cellulitis [facility]) Active Problems:   Acute on chronic diastolic CHF (congestive heart failure) (HCC)   Obesity, morbid (HCC)   Severe aortic stenosis   Atrial fibrillation, persistent (HCC)   Hypertension   Hypothyroidism  Sepsis secondary to bilateral cellulitis: Patient presented with tachycardia, tachypnea, bilateral lower extremity cellulitis and infected wounds with purulent drainage as well as leukocytosis. Empirically started on IV vancomycin and Rocephin. Continue IV fluid resuscitation. Consult wound care for bilateral lower extremity wounds with purulent drainage. Blood cultures No growth so far. Antibiotics de-escalated to ceftriaxone. Sepsis physiology resolved.  Antibiotics can be changed to oral abx at discharge.   Acute on chronic diastolic CHF: Patient presented with complaints of shortness of breath and wheezing. Chest x-ray showed mild interstitial edema and pulmonary vascular congestion. Last 2D Echo from 04/24 showed an LVEF of 55 to 60% Patient received 2 L IV fluid bolus in the ER. She was also given a dose of Lasix 20 mg IV once. Continue as needed bronchodilator  therapy. Continue Lasix 40 mg po dialy   Hypothyroidism: Continue Synthroid.   Hypertension: Continue bisoprolol. Hydrochlorothiazide discontinued as she is on lasix.   Atrial fibrillation, persistent (HCC) Continue apixaban as primary prophylaxis for an acute stroke Continue bisoprolol for rate control. HR controlled.   Severe aortic stenosis Status post TAVR Stable.   Obesity, morbid (HCC) BMI 50.26 Complicates overall prognosis and care. Diet and exercise discussed in detail.   DVT prophylaxis:  Code Status: Full code Family Communication: No family at bed side. Disposition Plan:    Status is: Inpatient Remains inpatient appropriate because: Admitted for sepsis secondary to bilateral lower extremity cellulitis with ulcerative changes.  Cellulitis is improving. PT and  OT recommended SNF.  Awaiting insurance authorization.  Consultants:  None  Procedures: None  Antimicrobials:  Anti-infectives (From admission, onward)    Start     Dose/Rate Route Frequency Ordered Stop   09/24/22 1500  vancomycin (VANCOREADY) IVPB 1500 mg/300 mL  Status:  Discontinued        1,500 mg 150 mL/hr over 120 Minutes Intravenous Every 24 hours 09/24/22 0112 09/25/22 1128   09/24/22 1000  cefTRIAXone (ROCEPHIN) 2 g in sodium chloride 0.9 % 100 mL IVPB        2 g 200 mL/hr over 30 Minutes Intravenous Every 24 hours 09/24/22 0813     09/23/22 2200  cefTRIAXone (ROCEPHIN) 1 g in sodium chloride 0.9 % 100 mL IVPB  Status:  Discontinued        1 g 200 mL/hr over 30 Minutes Intravenous Every 24 hours 09/23/22 1537 09/24/22 0813   09/23/22 1545  vancomycin (VANCOCIN) IVPB 1000 mg/200 mL premix  Status:  Discontinued        1,000 mg  200 mL/hr over 60 Minutes Intravenous  Once 09/23/22 1536 09/23/22 1536   09/23/22 1515  vancomycin (VANCOCIN) IVPB 1000 mg/200 mL premix        1,000 mg 200 mL/hr over 60 Minutes Intravenous  Once 09/23/22 1505 09/23/22 1636   09/23/22 1400  vancomycin  (VANCOREADY) IVPB 2000 mg/400 mL  Status:  Discontinued        2,000 mg 200 mL/hr over 120 Minutes Intravenous  Once 09/23/22 1353 09/23/22 1505   09/23/22 1345  ceFEPIme (MAXIPIME) 2 g in sodium chloride 0.9 % 100 mL IVPB        2 g 200 mL/hr over 30 Minutes Intravenous  Once 09/23/22 1342 09/23/22 1510   09/23/22 1330  vancomycin (VANCOCIN) IVPB 1000 mg/200 mL premix  Status:  Discontinued        1,000 mg 200 mL/hr over 60 Minutes Intravenous  Once 09/23/22 1320 09/23/22 1526   09/23/22 1330  aztreonam (AZACTAM) 2 g in sodium chloride 0.9 % 100 mL IVPB  Status:  Discontinued        2 g 200 mL/hr over 30 Minutes Intravenous  Once 09/23/22 1320 09/23/22 1342      Subjective: Patient was seen and examined at bedside.Overnight events noted. Patient reports doing much better.  She was sitting comfortably on the chair. Redness and oozing from the lower extremities has improved.  Objective: Vitals:   09/27/22 1324 09/27/22 2148 09/27/22 2149 09/28/22 0558  BP: (!) 152/95  115/79 (!) 139/92  Pulse: 93 91 (!) 103 91  Resp: 20 (!) 21  17  Temp: 97.9 F (36.6 C) 98.4 F (36.9 C)  97.6 F (36.4 C)  TempSrc: Oral Oral  Oral  SpO2: 92% 93% 96% 95%  Weight:      Height:        Intake/Output Summary (Last 24 hours) at 09/28/2022 1109 Last data filed at 09/28/2022 4098 Gross per 24 hour  Intake 560 ml  Output 1200 ml  Net -640 ml   Filed Weights   09/23/22 1351  Weight: (!) 137 kg    Examination:  General exam: Appears comfortable, morbidly obese, deconditioned, not in any acute distress. Respiratory system: CTA bilaterally, respiratory effort normal, RR 12 Cardiovascular system: S1 & S2 heard, Irregular  rhythm, no murmur. Gastrointestinal system: Abdomen is non distended, soft and non tender. Normal bowel sounds heard. Central nervous system: Alert and oriented x 3. No focal neurological deficits. Extremities: Venous stasis ulceration in both lower extremities with oozing Skin:  No rashes, lesions or ulcers Psychiatry: Judgement and insight appear normal. Mood & affect appropriate.     Data Reviewed: I have personally reviewed following labs and imaging studies  CBC: Recent Labs  Lab 09/23/22 1310 09/24/22 0458  WBC 11.7* 9.4  NEUTROABS 9.2*  --   HGB 11.2* 10.1*  HCT 39.1 35.0*  MCV 78.5* 77.3*  PLT 183 150   Basic Metabolic Panel: Recent Labs  Lab 09/23/22 1310 09/24/22 0458  NA 138 137  K 4.2 3.5  CL 97* 98  CO2 29 30  GLUCOSE 115* 106*  BUN 21 19  CREATININE 0.86 0.79  CALCIUM 9.1 8.7*   GFR: Estimated Creatinine Clearance: 85.3 mL/min (by C-G formula based on SCr of 0.79 mg/dL). Liver Function Tests: Recent Labs  Lab 09/23/22 1310  AST 14*  ALT 8  ALKPHOS 95  BILITOT 0.7  PROT 7.1  ALBUMIN 3.4*   No results for input(s): "LIPASE", "AMYLASE" in the last 168 hours. No  results for input(s): "AMMONIA" in the last 168 hours. Coagulation Profile: Recent Labs  Lab 09/23/22 1310 09/24/22 0458  INR 1.5* 1.6*   Cardiac Enzymes: No results for input(s): "CKTOTAL", "CKMB", "CKMBINDEX", "TROPONINI" in the last 168 hours. BNP (last 3 results) No results for input(s): "PROBNP" in the last 8760 hours. HbA1C: No results for input(s): "HGBA1C" in the last 72 hours. CBG: No results for input(s): "GLUCAP" in the last 168 hours. Lipid Profile: No results for input(s): "CHOL", "HDL", "LDLCALC", "TRIG", "CHOLHDL", "LDLDIRECT" in the last 72 hours. Thyroid Function Tests: No results for input(s): "TSH", "T4TOTAL", "FREET4", "T3FREE", "THYROIDAB" in the last 72 hours. Anemia Panel: No results for input(s): "VITAMINB12", "FOLATE", "FERRITIN", "TIBC", "IRON", "RETICCTPCT" in the last 72 hours. Sepsis Labs: Recent Labs  Lab 09/23/22 1419 09/24/22 0458  PROCALCITON  --  <0.10  LATICACIDVEN 1.5  --     Recent Results (from the past 240 hour(s))  Culture, blood (routine x 2)     Status: None   Collection Time: 09/23/22  1:00 PM    Specimen: BLOOD LEFT ARM  Result Value Ref Range Status   Specimen Description   Final    BLOOD LEFT ARM Performed at [facility], [address] [place], [place] [address]    Special Requests   Final    BOTTLES DRAWN AEROBIC AND ANAEROBIC Blood Culture adequate volume Performed at [facility], [address] [place], [place] [address]    Culture   Final    NO GROWTH 5 DAYS Performed at [facility], [address], [place], [place] [address]    Report Status 09/28/2022 FINAL  Final  Resp panel by RT-PCR (RSV, Flu A&B, Covid) Anterior Nasal Swab     Status: None   Collection Time: 09/23/22  2:20 PM   Specimen: Anterior Nasal Swab  Result Value Ref Range Status   SARS Coronavirus 2 by RT PCR NEGATIVE NEGATIVE Final    Comment: (NOTE) SARS-CoV-2 target nucleic acids are NOT DETECTED.  The SARS-CoV-2 RNA is generally detectable in upper respiratory specimens during the acute phase of infection. The lowest concentration of SARS-CoV-2 viral copies this assay can detect is 138 copies/mL. A negative result does not preclude SARS-Cov-2 infection and should not be used as the sole basis for treatment or other patient management decisions. A negative result may occur with  improper specimen collection/handling, submission of specimen other than nasopharyngeal swab, presence of viral mutation(s) within the areas targeted by this assay, and inadequate number of viral copies(<138 copies/mL). A negative result must be combined with clinical observations, patient history, and epidemiological information. The expected result is Negative.  Fact Sheet for Patients:  BloggerCourse.com  Fact Sheet for Healthcare Providers:  SeriousBroker.it  This test is no t yet approved or cleared by the [place] FDA and  has been authorized for detection and/or diagnosis of SARS-CoV-2 by FDA under an Emergency Use  Authorization (EUA). This EUA will remain  in effect (meaning this test can be used) for the duration of the COVID-19 declaration under Section 564(b)(1) of the Act, 21 U.S.C.section 360bbb-3(b)(1), unless the authorization is terminated  or revoked sooner.       Influenza A by PCR NEGATIVE NEGATIVE Final   Influenza B by PCR NEGATIVE NEGATIVE Final    Comment: (NOTE) The Xpert Xpress SARS-CoV-2/FLU/RSV plus assay is intended as an aid in the diagnosis of influenza from Nasopharyngeal swab specimens and should not be used as a sole  basis for treatment. Nasal washings and aspirates are unacceptable for Xpert Xpress SARS-CoV-2/FLU/RSV testing.  Fact Sheet for Patients: BloggerCourse.com  Fact Sheet for Healthcare Providers: SeriousBroker.it  This test is not yet approved or cleared by the [place] FDA and has been authorized for detection and/or diagnosis of SARS-CoV-2 by FDA under an Emergency Use Authorization (EUA). This EUA will remain in effect (meaning this test can be used) for the duration of the COVID-19 declaration under Section 564(b)(1) of the Act, 21 U.S.C. section 360bbb-3(b)(1), unless the authorization is terminated or revoked.     Resp Syncytial Virus by PCR NEGATIVE NEGATIVE Final    Comment: (NOTE) Fact Sheet for Patients: BloggerCourse.com  Fact Sheet for Healthcare Providers: SeriousBroker.it  This test is not yet approved or cleared by the [place] FDA and has been authorized for detection and/or diagnosis of SARS-CoV-2 by FDA under an Emergency Use Authorization (EUA). This EUA will remain in effect (meaning this test can be used) for the duration of the COVID-19 declaration under Section 564(b)(1) of the Act, 21 U.S.C. section 360bbb-3(b)(1), unless the authorization is terminated or revoked.  Performed at [facility],  [address] [place], [place] [address]   Culture, blood (routine x 2)     Status: None   Collection Time: 09/23/22  7:15 PM   Specimen: BLOOD RIGHT ARM  Result Value Ref Range Status   Specimen Description   Final    BLOOD RIGHT ARM Performed at [facility], [address], [place], [place] [address]    Special Requests   Final    BOTTLES DRAWN AEROBIC ONLY Blood Culture adequate volume Performed at [facility], [address], [place], [place] [address]    Culture   Final    NO GROWTH 5 DAYS Performed at [facility], [address], [place], [place] [address]    Report Status 09/28/2022 FINAL  Final    Radiology Studies: No results found.  Scheduled Meds:  apixaban  5 mg Oral BID   vitamin C  250 mg Oral QHS   bisoprolol  5 mg Oral Daily   carbidopa-levodopa  1 tablet Oral 2 times per day   carbidopa-levodopa  2 tablet Oral 2 times per day   vitamin B-12  1,000 mcg Oral Daily   furosemide  40 mg Oral Daily   levothyroxine  75 mcg Oral Q0600   multivitamin  1 tablet Oral BID   pantoprazole  40 mg Oral Daily   potassium chloride SA  40 mEq Oral Daily   Continuous Infusions:  cefTRIAXone (ROCEPHIN)  IV 2 g (09/28/22 0846)     LOS: 5 days    Time spent: 35 mins    Willeen Niece, MD [facility]   If 7PM-7AM, please contact night-coverage

## 2022-09-28 NOTE — Progress Notes (Signed)
Placed pt back on heart monitor; verified on tele screen pt heart rhythm showing up

## 2022-09-28 NOTE — Plan of Care (Signed)
  Problem: Clinical Measurements: Goal: Signs and symptoms of infection will decrease Outcome: Progressing   Problem: Education: Goal: Knowledge of General Education information will improve Description: Including pain rating scale, medication(s)/side effects and non-pharmacologic comfort measures Outcome: Progressing   Problem: Activity: Goal: Risk for activity intolerance will decrease Outcome: Progressing   Problem: Coping: Goal: Level of anxiety will decrease Outcome: Progressing   Problem: Pain Managment: Goal: General experience of comfort will improve Outcome: Progressing

## 2022-09-29 DIAGNOSIS — A419 Sepsis, unspecified organism: Secondary | ICD-10-CM | POA: Diagnosis not present

## 2022-09-29 DIAGNOSIS — L0390 Cellulitis, unspecified: Secondary | ICD-10-CM | POA: Diagnosis not present

## 2022-09-29 MED ORDER — HYDROXYZINE HCL 10 MG PO TABS
10.0000 mg | ORAL_TABLET | Freq: Once | ORAL | Status: AC | PRN
  Administered 2022-09-29: 10 mg via ORAL
  Filled 2022-09-29: qty 1

## 2022-09-29 NOTE — Progress Notes (Signed)
PROGRESS NOTE    ADRIONA KANEY  GNF:[id number] DOB: [birth year] DOA: 09/23/2022 PCP: Mila Palmer, MD  Brief Narrative: This 72 years old female with PMH significant for A-fib on chronic anticoagulation therapy, history of aortic valve stenosis s/p TAVR, hypertension, hypothyroidism, GERD, morbid obesity, chronic venous stasis presented in the ED for the evaluation of pain,  redness and purulent drainage from bilateral lower extremity ulcers.  Patient has bilateral lower extremity wounds and has noted increased foul-smelling drainage from the wounds.  She also reported chills and undocumented fever at home.  Patient also complains of rash under both breasts which is pruritic.  Patient is admitted for bilateral lower extremity cellulitis.  Assessment & Plan:   Principal Problem:   Sepsis due to cellulitis [facility]) Active Problems:   Acute on chronic diastolic CHF (congestive heart failure) (HCC)   Obesity, morbid (HCC)   Severe aortic stenosis   Atrial fibrillation, persistent (HCC)   Hypertension   Hypothyroidism  Sepsis secondary to bilateral cellulitis: Patient presented with tachycardia, tachypnea, bilateral lower extremity cellulitis and infected wounds with purulent drainage as well as leukocytosis. Empirically started on IV vancomycin and Rocephin. Continue IV fluid resuscitation. Consult wound care for bilateral lower extremity wounds with purulent drainage. Blood cultures No growth so far. Antibiotics de-escalated to ceftriaxone. Sepsis physiology resolved.  Antibiotics can be changed to oral abx at discharge.   Acute on chronic diastolic CHF: Patient presented with complaints of shortness of breath and wheezing. Chest x-ray showed mild interstitial edema and pulmonary vascular congestion. Last 2D Echo from 04/24 showed an LVEF of 55 to 60% Patient received 2 L IV fluid bolus in the ER. She was also given a dose of Lasix 20 mg IV once. Continue as needed bronchodilator  therapy. Continue Lasix 40 mg po dialy   Hypothyroidism: Continue Synthroid.   Hypertension: Continue bisoprolol. Hydrochlorothiazide discontinued as she is on lasix.   Atrial fibrillation, persistent (HCC) Continue apixaban as primary prophylaxis for an acute stroke Continue bisoprolol for rate control. HR controlled.   Severe aortic stenosis Status post TAVR Stable.   Obesity, morbid (HCC) BMI 50.26 Complicates overall prognosis and care. Diet and exercise discussed in detail.   DVT prophylaxis:  Code Status: Full code Family Communication: No family at bed side. Disposition Plan:    Status is: Inpatient Remains inpatient appropriate because: Admitted for sepsis secondary to bilateral lower extremity cellulitis with ulcerative changes.  Cellulitis is improving. PT and  OT recommended SNF.  Awaiting insurance authorization.  Consultants:  None  Procedures: None  Antimicrobials:  Anti-infectives (From admission, onward)    Start     Dose/Rate Route Frequency Ordered Stop   09/24/22 1500  vancomycin (VANCOREADY) IVPB 1500 mg/300 mL  Status:  Discontinued        1,500 mg 150 mL/hr over 120 Minutes Intravenous Every 24 hours 09/24/22 0112 09/25/22 1128   09/24/22 1000  cefTRIAXone (ROCEPHIN) 2 g in sodium chloride 0.9 % 100 mL IVPB        2 g 200 mL/hr over 30 Minutes Intravenous Every 24 hours 09/24/22 0813     09/23/22 2200  cefTRIAXone (ROCEPHIN) 1 g in sodium chloride 0.9 % 100 mL IVPB  Status:  Discontinued        1 g 200 mL/hr over 30 Minutes Intravenous Every 24 hours 09/23/22 1537 09/24/22 0813   09/23/22 1545  vancomycin (VANCOCIN) IVPB 1000 mg/200 mL premix  Status:  Discontinued        1,000 mg  200 mL/hr over 60 Minutes Intravenous  Once 09/23/22 1536 09/23/22 1536   09/23/22 1515  vancomycin (VANCOCIN) IVPB 1000 mg/200 mL premix        1,000 mg 200 mL/hr over 60 Minutes Intravenous  Once 09/23/22 1505 09/23/22 1636   09/23/22 1400  vancomycin  (VANCOREADY) IVPB 2000 mg/400 mL  Status:  Discontinued        2,000 mg 200 mL/hr over 120 Minutes Intravenous  Once 09/23/22 1353 09/23/22 1505   09/23/22 1345  ceFEPIme (MAXIPIME) 2 g in sodium chloride 0.9 % 100 mL IVPB        2 g 200 mL/hr over 30 Minutes Intravenous  Once 09/23/22 1342 09/23/22 1510   09/23/22 1330  vancomycin (VANCOCIN) IVPB 1000 mg/200 mL premix  Status:  Discontinued        1,000 mg 200 mL/hr over 60 Minutes Intravenous  Once 09/23/22 1320 09/23/22 1526   09/23/22 1330  aztreonam (AZACTAM) 2 g in sodium chloride 0.9 % 100 mL IVPB  Status:  Discontinued        2 g 200 mL/hr over 30 Minutes Intravenous  Once 09/23/22 1320 09/23/22 1342      Subjective: Patient was seen and examined at bedside.Overnight events noted. Patient reports doing better. She reports slept well last night. Redness and swelling in the lower extremities is significantly improved.  Objective: Vitals:   09/28/22 0558 09/28/22 1314 09/28/22 2037 09/29/22 0424  BP: (!) 139/92 127/86 130/77 122/75  Pulse: 91 90 95 84  Resp: 17 18 (!) 22 (!) 22  Temp: 97.6 F (36.4 C) 97.9 F (36.6 C) 98 F (36.7 C) 97.6 F (36.4 C)  TempSrc: Oral Oral Oral Oral  SpO2: 95% 95% 94% 96%  Weight:      Height:        Intake/Output Summary (Last 24 hours) at 09/29/2022 1137 Last data filed at 09/29/2022 0600 Gross per 24 hour  Intake 340 ml  Output 1020 ml  Net -680 ml   Filed Weights   09/23/22 1351  Weight: (!) 137 kg    Examination:  General exam: Appears comfortable, morbidly obese, deconditioned, NAD. Respiratory system: CTA bilaterally, respiratory effort normal, RR 14 Cardiovascular system: S1 & S2 heard, Irregular  rhythm, no murmur. Gastrointestinal system: Abdomen is non distended, soft and non tender. Normal bowel sounds heard. Central nervous system: Alert and oriented x 3. No focal neurological deficits. Extremities: Venous stasis ulceration in both lower extremities with oozing Skin:  No rashes, lesions or ulcers Psychiatry: Judgement and insight appear normal. Mood & affect appropriate.     Data Reviewed: I have personally reviewed following labs and imaging studies  CBC: Recent Labs  Lab 09/23/22 1310 09/24/22 0458  WBC 11.7* 9.4  NEUTROABS 9.2*  --   HGB 11.2* 10.1*  HCT 39.1 35.0*  MCV 78.5* 77.3*  PLT 183 150   Basic Metabolic Panel: Recent Labs  Lab 09/23/22 1310 09/24/22 0458  NA 138 137  K 4.2 3.5  CL 97* 98  CO2 29 30  GLUCOSE 115* 106*  BUN 21 19  CREATININE 0.86 0.79  CALCIUM 9.1 8.7*   GFR: Estimated Creatinine Clearance: 85.3 mL/min (by C-G formula based on SCr of 0.79 mg/dL). Liver Function Tests: Recent Labs  Lab 09/23/22 1310  AST 14*  ALT 8  ALKPHOS 95  BILITOT 0.7  PROT 7.1  ALBUMIN 3.4*   No results for input(s): "LIPASE", "AMYLASE" in the last 168 hours. No results for input(s): "AMMONIA"  in the last 168 hours. Coagulation Profile: Recent Labs  Lab 09/23/22 1310 09/24/22 0458  INR 1.5* 1.6*   Cardiac Enzymes: No results for input(s): "CKTOTAL", "CKMB", "CKMBINDEX", "TROPONINI" in the last 168 hours. BNP (last 3 results) No results for input(s): "PROBNP" in the last 8760 hours. HbA1C: No results for input(s): "HGBA1C" in the last 72 hours. CBG: No results for input(s): "GLUCAP" in the last 168 hours. Lipid Profile: No results for input(s): "CHOL", "HDL", "LDLCALC", "TRIG", "CHOLHDL", "LDLDIRECT" in the last 72 hours. Thyroid Function Tests: No results for input(s): "TSH", "T4TOTAL", "FREET4", "T3FREE", "THYROIDAB" in the last 72 hours. Anemia Panel: No results for input(s): "VITAMINB12", "FOLATE", "FERRITIN", "TIBC", "IRON", "RETICCTPCT" in the last 72 hours. Sepsis Labs: Recent Labs  Lab 09/23/22 1419 09/24/22 0458  PROCALCITON  --  <0.10  LATICACIDVEN 1.5  --     Recent Results (from the past 240 hour(s))  Culture, blood (routine x 2)     Status: None   Collection Time: 09/23/22  1:00 PM    Specimen: BLOOD LEFT ARM  Result Value Ref Range Status   Specimen Description   Final    BLOOD LEFT ARM Performed at [facility], [address], [place], [place] [address]    Special Requests   Final    BOTTLES DRAWN AEROBIC AND ANAEROBIC Blood Culture adequate volume Performed at [facility], [address], [place], [place] [address]    Culture   Final    NO GROWTH 5 DAYS Performed at [facility], [address] [place], [place] [address]    Report Status 09/28/2022 FINAL  Final  Resp panel by RT-PCR (RSV, Flu A&B, Covid) Anterior Nasal Swab     Status: None   Collection Time: 09/23/22  2:20 PM   Specimen: Anterior Nasal Swab  Result Value Ref Range Status   SARS Coronavirus 2 by RT PCR NEGATIVE NEGATIVE Final    Comment: (NOTE) SARS-CoV-2 target nucleic acids are NOT DETECTED.  The SARS-CoV-2 RNA is generally detectable in upper respiratory specimens during the acute phase of infection. The lowest concentration of SARS-CoV-2 viral copies this assay can detect is 138 copies/mL. A negative result does not preclude SARS-Cov-2 infection and should not be used as the sole basis for treatment or other patient management decisions. A negative result may occur with  improper specimen collection/handling, submission of specimen other than nasopharyngeal swab, presence of viral mutation(s) within the areas targeted by this assay, and inadequate number of viral copies(<138 copies/mL). A negative result must be combined with clinical observations, patient history, and epidemiological information. The expected result is Negative.  Fact Sheet for Patients:  BloggerCourse.com  Fact Sheet for Healthcare Providers:  SeriousBroker.it  This test is no t yet approved or cleared by the [place] FDA and  has been authorized for detection and/or diagnosis of SARS-CoV-2 by FDA under an Emergency Use  Authorization (EUA). This EUA will remain  in effect (meaning this test can be used) for the duration of the COVID-19 declaration under Section 564(b)(1) of the Act, 21 U.S.C.section 360bbb-3(b)(1), unless the authorization is terminated  or revoked sooner.       Influenza A by PCR NEGATIVE NEGATIVE Final   Influenza B by PCR NEGATIVE NEGATIVE Final    Comment: (NOTE) The Xpert Xpress SARS-CoV-2/FLU/RSV plus assay is intended as an aid in the diagnosis of influenza from Nasopharyngeal swab specimens and should not be used as a sole basis for treatment. Nasal  washings and aspirates are unacceptable for Xpert Xpress SARS-CoV-2/FLU/RSV testing.  Fact Sheet for Patients: BloggerCourse.com  Fact Sheet for Healthcare Providers: SeriousBroker.it  This test is not yet approved or cleared by the [place] FDA and has been authorized for detection and/or diagnosis of SARS-CoV-2 by FDA under an Emergency Use Authorization (EUA). This EUA will remain in effect (meaning this test can be used) for the duration of the COVID-19 declaration under Section 564(b)(1) of the Act, 21 U.S.C. section 360bbb-3(b)(1), unless the authorization is terminated or revoked.     Resp Syncytial Virus by PCR NEGATIVE NEGATIVE Final    Comment: (NOTE) Fact Sheet for Patients: BloggerCourse.com  Fact Sheet for Healthcare Providers: SeriousBroker.it  This test is not yet approved or cleared by the [place] FDA and has been authorized for detection and/or diagnosis of SARS-CoV-2 by FDA under an Emergency Use Authorization (EUA). This EUA will remain in effect (meaning this test can be used) for the duration of the COVID-19 declaration under Section 564(b)(1) of the Act, 21 U.S.C. section 360bbb-3(b)(1), unless the authorization is terminated or revoked.  Performed at [facility],  [address] [place], [place] [address]   Culture, blood (routine x 2)     Status: None   Collection Time: 09/23/22  7:15 PM   Specimen: BLOOD RIGHT ARM  Result Value Ref Range Status   Specimen Description   Final    BLOOD RIGHT ARM Performed at [facility], [address] [place], [place] [address]    Special Requests   Final    BOTTLES DRAWN AEROBIC ONLY Blood Culture adequate volume Performed at [facility], [address], [place], [place] [address]    Culture   Final    NO GROWTH 5 DAYS Performed at [facility], [address] [place], [place] [address]    Report Status 09/28/2022 FINAL  Final    Radiology Studies: No results found.  Scheduled Meds:  apixaban  5 mg Oral BID   vitamin C  250 mg Oral QHS   bisoprolol  5 mg Oral Daily   carbidopa-levodopa  1 tablet Oral 2 times per day   carbidopa-levodopa  2 tablet Oral 2 times per day   vitamin B-12  1,000 mcg Oral Daily   furosemide  40 mg Oral Daily   levothyroxine  75 mcg Oral Q0600   multivitamin  1 tablet Oral BID   pantoprazole  40 mg Oral Daily   potassium chloride SA  40 mEq Oral Daily   Continuous Infusions:  cefTRIAXone (ROCEPHIN)  IV 2 g (09/29/22 0856)     LOS: 6 days    Time spent: 35 mins    Willeen Niece, MD [facility]   If 7PM-7AM, please contact night-coverage

## 2022-09-29 NOTE — Progress Notes (Addendum)
Patient requested for help getting back to bed. Assisted x 2. Adjusted chair and gripper socks placed. When assisted up in bed, much encouragement given for patient to assist staff with lifting up in bed. Patient very demanding. Staff attempting to make comfortable, but patient is unhappy and continues to say that staff is not helping her. 2048- Requesting for nurse to take off bandages from her bilateral lower extremeties

## 2022-09-29 NOTE — Progress Notes (Signed)
Mobility Specialist - Progress Note   09/29/22 1036  Mobility  Activity Ambulated with assistance in room;Ambulated with assistance in hallway  Level of Assistance Maximum assist, patient does 25-49%  Assistive Device Front wheel walker  Distance Ambulated (ft) 20 ft  Range of Motion/Exercises Active Assistive  Activity Response Tolerated fair  $Mobility charge 1 Mobility  Mobility Specialist Start Time (ACUTE ONLY) 0957  Mobility Specialist Stop Time (ACUTE ONLY) 1036  Mobility Specialist Time Calculation (min) (ACUTE ONLY) 39 min   Pt was found in bed and agreeable to ambulate after using BSC. Pt was max-A for bed mobility and STS after x2 failed attempts. Pt able to ambulate 45ft to door and once out door stated wanting to turn back due to fear of falling. At EOS was left on recliner chair with all needs met. Call bell in reach and chair alarm on.  Billey Chang Mobility Specialist

## 2022-09-30 DIAGNOSIS — A419 Sepsis, unspecified organism: Secondary | ICD-10-CM | POA: Diagnosis not present

## 2022-09-30 DIAGNOSIS — L0390 Cellulitis, unspecified: Secondary | ICD-10-CM | POA: Diagnosis not present

## 2022-09-30 NOTE — Progress Notes (Signed)
PROGRESS NOTE    VASILISA HILDAHL  ZOX:[id number] DOB: May 06, 1950 DOA: 09/23/2022 PCP: Mila Palmer, MD  Brief Narrative: This 72 years old female with PMH significant for A-fib on chronic anticoagulation therapy, history of aortic valve stenosis s/p TAVR, hypertension, hypothyroidism, GERD, morbid obesity, chronic venous stasis presented in the ED for the evaluation of pain,  redness and purulent drainage from bilateral lower extremity ulcers.  Patient has bilateral lower extremity wounds and has noted increased foul-smelling drainage from the wounds.  She also reported chills and undocumented fever at home.  Patient also complains of rash under both breasts which is pruritic.  Patient is admitted for bilateral lower extremity cellulitis.  Assessment & Plan:   Principal Problem:   Sepsis due to cellulitis [facility]) Active Problems:   Acute on chronic diastolic CHF (congestive heart failure) (HCC)   Obesity, morbid (HCC)   Severe aortic stenosis   Atrial fibrillation, persistent (HCC)   Hypertension   Hypothyroidism  Sepsis secondary to bilateral cellulitis: Patient presented with tachycardia, tachypnea, bilateral lower extremity cellulitis and infected wounds with purulent drainage as well as leukocytosis. Empirically started on IV vancomycin and Rocephin. Continue IV fluid resuscitation. Consulted wound care for bilateral lower extremity wounds with purulent drainage. Blood cultures>  No growth so far. Antibiotics de-escalated to ceftriaxone. Sepsis physiology resolved.  Antibiotics can be changed to oral abx at discharge.   Acute on chronic diastolic CHF: Patient presented with complaints of shortness of breath and wheezing. Chest x-ray showed mild interstitial edema and pulmonary vascular congestion. Last 2D Echo from 04/24 showed an LVEF of 55 to 60% Patient received 2 L IV fluid bolus in the ER. She was also given a dose of Lasix 20 mg IV once. Continue as needed bronchodilator  therapy. Continue Lasix 40 mg po dialy   Hypothyroidism: Continue Synthroid.   Hypertension: Continue bisoprolol. Hydrochlorothiazide discontinued as she is on lasix.   Atrial fibrillation, persistent (HCC) Continue apixaban as primary prophylaxis for an acute stroke Continue bisoprolol for rate control. HR controlled.   Severe aortic stenosis Status post TAVR Stable.   Obesity, morbid (HCC) BMI 50.26 Complicates overall prognosis and care. Diet and exercise discussed in detail.   DVT prophylaxis:  Code Status: Full code Family Communication: No family at bed side. Disposition Plan:    Status is: Inpatient Remains inpatient appropriate because: Admitted for sepsis secondary to bilateral lower extremity cellulitis with ulcerative changes.  Cellulitis is improving. PT and  OT recommended SNF.  Awaiting insurance authorization.  Consultants:  None  Procedures: None  Antimicrobials:  Anti-infectives (From admission, onward)    Start     Dose/Rate Route Frequency Ordered Stop   09/24/22 1500  vancomycin (VANCOREADY) IVPB 1500 mg/300 mL  Status:  Discontinued        1,500 mg 150 mL/hr over 120 Minutes Intravenous Every 24 hours 09/24/22 0112 09/25/22 1128   09/24/22 1000  cefTRIAXone (ROCEPHIN) 2 g in sodium chloride 0.9 % 100 mL IVPB        2 g 200 mL/hr over 30 Minutes Intravenous Every 24 hours 09/24/22 0813     09/23/22 2200  cefTRIAXone (ROCEPHIN) 1 g in sodium chloride 0.9 % 100 mL IVPB  Status:  Discontinued        1 g 200 mL/hr over 30 Minutes Intravenous Every 24 hours 09/23/22 1537 09/24/22 0813   09/23/22 1545  vancomycin (VANCOCIN) IVPB 1000 mg/200 mL premix  Status:  Discontinued        1,000  mg 200 mL/hr over 60 Minutes Intravenous  Once 09/23/22 1536 09/23/22 1536   09/23/22 1515  vancomycin (VANCOCIN) IVPB 1000 mg/200 mL premix        1,000 mg 200 mL/hr over 60 Minutes Intravenous  Once 09/23/22 1505 09/23/22 1636   09/23/22 1400  vancomycin  (VANCOREADY) IVPB 2000 mg/400 mL  Status:  Discontinued        2,000 mg 200 mL/hr over 120 Minutes Intravenous  Once 09/23/22 1353 09/23/22 1505   09/23/22 1345  ceFEPIme (MAXIPIME) 2 g in sodium chloride 0.9 % 100 mL IVPB        2 g 200 mL/hr over 30 Minutes Intravenous  Once 09/23/22 1342 09/23/22 1510   09/23/22 1330  vancomycin (VANCOCIN) IVPB 1000 mg/200 mL premix  Status:  Discontinued        1,000 mg 200 mL/hr over 60 Minutes Intravenous  Once 09/23/22 1320 09/23/22 1526   09/23/22 1330  aztreonam (AZACTAM) 2 g in sodium chloride 0.9 % 100 mL IVPB  Status:  Discontinued        2 g 200 mL/hr over 30 Minutes Intravenous  Once 09/23/22 1320 09/23/22 1342      Subjective: Patient was seen and examined at bedside.Overnight events noted. Patient reports doing better.  She slept well last night. Redness and swelling in the lower extremities has significantly improved. She is awaiting SNF placement.  Objective: Vitals:   09/29/22 0424 09/29/22 1342 09/29/22 2123 09/30/22 0631  BP: 122/75 104/69 126/82 133/72  Pulse: 84 96 91 86  Resp: (!) 22 18 20    Temp: 97.6 F (36.4 C) 98.1 F (36.7 C) 97.6 F (36.4 C) (!) 97.3 F (36.3 C)  TempSrc: Oral Oral Oral Oral  SpO2: 96% 100% 94% 94%  Weight:      Height:        Intake/Output Summary (Last 24 hours) at 09/30/2022 1126 Last data filed at 09/30/2022 1041 Gross per 24 hour  Intake 2280 ml  Output 1475 ml  Net 805 ml   Filed Weights   09/23/22 1351  Weight: (!) 137 kg    Examination:  General exam: Appears comfortable, morbidly obese, deconditioned, NAD. Respiratory system: CTA bilaterally, respiratory effort normal, RR 14 Cardiovascular system: S1 & S2 heard, Irregular  rhythm, no murmur. Gastrointestinal system: Abdomen is soft, non tender, non distended, bowel sounds present. Central nervous system: Alert and oriented x 3. No focal neurological deficits. Extremities: Venous stasis ulceration in both lower extremities  with oozing Skin: No rashes, lesions or ulcers Psychiatry: Judgement and insight appear normal. Mood & affect appropriate.     Data Reviewed: I have personally reviewed following labs and imaging studies  CBC: Recent Labs  Lab 09/23/22 1310 09/24/22 0458  WBC 11.7* 9.4  NEUTROABS 9.2*  --   HGB 11.2* 10.1*  HCT 39.1 35.0*  MCV 78.5* 77.3*  PLT 183 150   Basic Metabolic Panel: Recent Labs  Lab 09/23/22 1310 09/24/22 0458  NA 138 137  K 4.2 3.5  CL 97* 98  CO2 29 30  GLUCOSE 115* 106*  BUN 21 19  CREATININE 0.86 0.79  CALCIUM 9.1 8.7*   GFR: Estimated Creatinine Clearance: 85.3 mL/min (by C-G formula based on SCr of 0.79 mg/dL). Liver Function Tests: Recent Labs  Lab 09/23/22 1310  AST 14*  ALT 8  ALKPHOS 95  BILITOT 0.7  PROT 7.1  ALBUMIN 3.4*   No results for input(s): "LIPASE", "AMYLASE" in the last 168 hours. No results  for input(s): "AMMONIA" in the last 168 hours. Coagulation Profile: Recent Labs  Lab 09/23/22 1310 09/24/22 0458  INR 1.5* 1.6*   Cardiac Enzymes: No results for input(s): "CKTOTAL", "CKMB", "CKMBINDEX", "TROPONINI" in the last 168 hours. BNP (last 3 results) No results for input(s): "PROBNP" in the last 8760 hours. HbA1C: No results for input(s): "HGBA1C" in the last 72 hours. CBG: No results for input(s): "GLUCAP" in the last 168 hours. Lipid Profile: No results for input(s): "CHOL", "HDL", "LDLCALC", "TRIG", "CHOLHDL", "LDLDIRECT" in the last 72 hours. Thyroid Function Tests: No results for input(s): "TSH", "T4TOTAL", "FREET4", "T3FREE", "THYROIDAB" in the last 72 hours. Anemia Panel: No results for input(s): "VITAMINB12", "FOLATE", "FERRITIN", "TIBC", "IRON", "RETICCTPCT" in the last 72 hours. Sepsis Labs: Recent Labs  Lab 09/23/22 1419 09/24/22 0458  PROCALCITON  --  <0.10  LATICACIDVEN 1.5  --     Recent Results (from the past 240 hour(s))  Culture, blood (routine x 2)     Status: None   Collection Time: 09/23/22   1:00 PM   Specimen: BLOOD LEFT ARM  Result Value Ref Range Status   Specimen Description   Final    BLOOD LEFT ARM Performed at [facility], [address] [place], [place] [address]    Special Requests   Final    BOTTLES DRAWN AEROBIC AND ANAEROBIC Blood Culture adequate volume Performed at [facility], [address], [place], [place] [address]    Culture   Final    NO GROWTH 5 DAYS Performed at [facility], [address] [place], [place] [address]    Report Status 09/28/2022 FINAL  Final  Resp panel by RT-PCR (RSV, Flu A&B, Covid) Anterior Nasal Swab     Status: None   Collection Time: 09/23/22  2:20 PM   Specimen: Anterior Nasal Swab  Result Value Ref Range Status   SARS Coronavirus 2 by RT PCR NEGATIVE NEGATIVE Final    Comment: (NOTE) SARS-CoV-2 target nucleic acids are NOT DETECTED.  The SARS-CoV-2 RNA is generally detectable in upper respiratory specimens during the acute phase of infection. The lowest concentration of SARS-CoV-2 viral copies this assay can detect is 138 copies/mL. A negative result does not preclude SARS-Cov-2 infection and should not be used as the sole basis for treatment or other patient management decisions. A negative result may occur with  improper specimen collection/handling, submission of specimen other than nasopharyngeal swab, presence of viral mutation(s) within the areas targeted by this assay, and inadequate number of viral copies(<138 copies/mL). A negative result must be combined with clinical observations, patient history, and epidemiological information. The expected result is Negative.  Fact Sheet for Patients:  BloggerCourse.com  Fact Sheet for Healthcare Providers:  SeriousBroker.it  This test is no t yet approved or cleared by the [place] FDA and  has been authorized for detection and/or diagnosis of SARS-CoV-2 by FDA under an  Emergency Use Authorization (EUA). This EUA will remain  in effect (meaning this test can be used) for the duration of the COVID-19 declaration under Section 564(b)(1) of the Act, 21 U.S.C.section 360bbb-3(b)(1), unless the authorization is terminated  or revoked sooner.       Influenza A by PCR NEGATIVE NEGATIVE Final   Influenza B by PCR NEGATIVE NEGATIVE Final    Comment: (NOTE) The Xpert Xpress SARS-CoV-2/FLU/RSV plus assay is intended as an aid in the diagnosis of influenza from Nasopharyngeal swab specimens and should not be used as a sole basis  for treatment. Nasal washings and aspirates are unacceptable for Xpert Xpress SARS-CoV-2/FLU/RSV testing.  Fact Sheet for Patients: BloggerCourse.com  Fact Sheet for Healthcare Providers: SeriousBroker.it  This test is not yet approved or cleared by the [place] FDA and has been authorized for detection and/or diagnosis of SARS-CoV-2 by FDA under an Emergency Use Authorization (EUA). This EUA will remain in effect (meaning this test can be used) for the duration of the COVID-19 declaration under Section 564(b)(1) of the Act, 21 U.S.C. section 360bbb-3(b)(1), unless the authorization is terminated or revoked.     Resp Syncytial Virus by PCR NEGATIVE NEGATIVE Final    Comment: (NOTE) Fact Sheet for Patients: BloggerCourse.com  Fact Sheet for Healthcare Providers: SeriousBroker.it  This test is not yet approved or cleared by the [place] FDA and has been authorized for detection and/or diagnosis of SARS-CoV-2 by FDA under an Emergency Use Authorization (EUA). This EUA will remain in effect (meaning this test can be used) for the duration of the COVID-19 declaration under Section 564(b)(1) of the Act, 21 U.S.C. section 360bbb-3(b)(1), unless the authorization is terminated or revoked.  Performed at [facility], [address], [place], [place] [address]   Culture, blood (routine x 2)     Status: None   Collection Time: 09/23/22  7:15 PM   Specimen: BLOOD RIGHT ARM  Result Value Ref Range Status   Specimen Description   Final    BLOOD RIGHT ARM Performed at [facility], [address] [place], [place] [address]    Special Requests   Final    BOTTLES DRAWN AEROBIC ONLY Blood Culture adequate volume Performed at [facility], [address] [place], [place] [address]    Culture   Final    NO GROWTH 5 DAYS Performed at [facility], [address] [place], [place] [address]    Report Status 09/28/2022 FINAL  Final    Radiology Studies: No results found.  Scheduled Meds:  apixaban  5 mg Oral BID   vitamin C  250 mg Oral QHS   bisoprolol  5 mg Oral Daily   carbidopa-levodopa  1 tablet Oral 2 times per day   carbidopa-levodopa  2 tablet Oral 2 times per day   vitamin B-12  1,000 mcg Oral Daily   furosemide  40 mg Oral Daily   levothyroxine  75 mcg Oral Q0600   multivitamin  1 tablet Oral BID   pantoprazole  40 mg Oral Daily   potassium chloride SA  40 mEq Oral Daily   Continuous Infusions:  cefTRIAXone (ROCEPHIN)  IV 2 g (09/30/22 1041)     LOS: 7 days    Time spent: 35 mins    Willeen Niece, MD [facility]   If 7PM-7AM, please contact night-coverage

## 2022-09-30 NOTE — TOC Progression Note (Signed)
Transition of Care [facility]) - Progression Note    Patient Details  Name: EVAYA BRIDSON MRN: [id number] Date of Birth: September 12, 1950  Transition [facility]) CM/SW Contact  Nicey Krah, Olegario Messier, RN Phone Number: 09/30/2022, 2:32 PM  Clinical Narrative: From [name]-patient in agreement to ST SNF-The only bed offer given [facility]-await choice prior auth.      Expected Discharge Plan: Skilled Nursing Facility Barriers to Discharge: Continued Medical Work up  Expected Discharge Plan and Services       Living arrangements for the past 2 months: Assisted Living Facility                                       Social Determinants of Health (SDOH) Interventions SDOH Screenings   Food Insecurity: No Food Insecurity (09/24/2022)  Housing: Low Risk  (09/24/2022)  Transportation Needs: No Transportation Needs (09/24/2022)  Utilities: Not At Risk (09/24/2022)  Financial Resource Strain: Low Risk  (10/18/2021)  Tobacco Use: Low Risk  (09/23/2022)    Readmission Risk Interventions     No data to display

## 2022-09-30 NOTE — Plan of Care (Signed)
  Problem: Fluid Volume: Goal: Hemodynamic stability will improve Outcome: Progressing   Problem: Clinical Measurements: Goal: Diagnostic test results will improve Outcome: Progressing   Problem: Clinical Measurements: Goal: Signs and symptoms of infection will decrease Outcome: Progressing

## 2022-09-30 NOTE — Progress Notes (Signed)
Mobility Specialist - Progress Note   09/30/22 1535  Mobility  Activity Ambulated with assistance in hallway  Level of Assistance Maximum assist, patient does 25-49%  Assistive Device Front wheel walker  Distance Ambulated (ft) 30 ft  Range of Motion/Exercises Active  Activity Response Tolerated well  Mobility Referral Yes  $Mobility charge 1 Mobility  Mobility Specialist Start Time (ACUTE ONLY) 1500  Mobility Specialist Stop Time (ACUTE ONLY) 1535  Mobility Specialist Time Calculation (min) (ACUTE ONLY) 35 min   Pt was found in bed and agreeable to ambulate. Max-A with bed mobility and STS. Gets fatigued and feels SOB quickly. Encouraged pt to practice pursed lip throughout session. At EOS returned to recliner chair with all needs met. Call bell in reach and chair alarm on. Family in room.  Billey Chang Mobility Specialist

## 2022-10-01 DIAGNOSIS — A419 Sepsis, unspecified organism: Secondary | ICD-10-CM | POA: Diagnosis not present

## 2022-10-01 DIAGNOSIS — L0390 Cellulitis, unspecified: Secondary | ICD-10-CM | POA: Diagnosis not present

## 2022-10-01 NOTE — Progress Notes (Signed)
Physical Therapy Treatment Patient Details Name: Cindy Richmond MRN: [id number] DOB: 1950-12-21 Today's Date: 10/01/2022   History of Present Illness 72 yo female presented to ED on 09/23/2022 due to B LE pain and foul-smelling drainage from LE wounds. Pt reports chills and feeling feverish. Pt was tachycardic and tachyphemic with abn labs including elevated WBC count. Pt was found to have sepsis secondary to B LE cellulitis with ulcerative changes. Pt started on IV ABX and fluid. Pt PMH includes but is not limited to: dCHF, A-fib, HTN, hypothyroidism, aortic stenosis, HLD, PD, s/p TAVR, OSA, L reverse TSA, and HOH.    PT Comments  Pt upset upon my arrival. She reports being uncomfortable in recliner and having to wait too long for assistance. She was agreeable to working with therapy. Observed pt to have had a bowel incontinence episode when her feet were lowered to the floor-pt unaware. NT arrived to assist with getting pt cleaned up. Pt requires encouragement to put forth full effort and to try to do as much for herself as she can. She required +2 assist to rise from recliner and +2 for safety to take a few steps in room with her rollator. She remains anxious and at risk for falls when mobilizing. Patient will benefit from continued inpatient follow up therapy, <3 hours/day.    If plan is discharge home, recommend the following: Two people to help with walking and/or transfers;Two people to help with bathing/dressing/bathroom;Assistance with cooking/housework;Direct supervision/assist for medications management;Assist for transportation;Help with stairs or ramp for entrance   Can travel by private vehicle     No  Equipment Recommendations  None recommended by PT    Recommendations for Other Services       Precautions / Restrictions Precautions Precautions: Fall Restrictions Weight Bearing Restrictions: No     Mobility  Bed Mobility Overal bed mobility: Needs Assistance Bed Mobility: Sit  to Supine, Rolling Rolling: Mod assist     Sit to supine: Mod assist, HOB elevated   General bed mobility comments: required much assist to transfer from sit to supine due to body habitus.  Pt stated she sleeps in her recliner. Cues for pt to do as much for herself as she can    Transfers   Equipment used: Rollator (4 wheels) Transfers: Sit to/from Stand Sit to Stand: Mod assist, +2 physical assistance, +2 safety/equipment Stand pivot transfers: Min assist         General transfer comment: Multiple attempts, increased time and effort to help pt rise from recliner. Max cues for proper technique (correct placement of feet, anterior weightshift, hand placement). Assist to power up, steady, control descent.    Ambulation/Gait Ambulation/Gait assistance: Min assist; +2 safety/equipment Gait Distance (Feet): 5 Feet Assistive device: Rollator (4 wheels) Gait Pattern/deviations: Step-through pattern, Decreased stride length       General Gait Details: Slow. Cues for RW  proximity, posture, safety. Pt walk a short distance from bsc to bed wit her personal rollator   Stairs             Wheelchair Mobility     Tilt Bed    Modified Rankin (Stroke Patients Only)       Balance Overall balance assessment: Needs assistance, History of Falls         Standing balance support: Bilateral upper extremity supported, During functional activity, Reliant on assistive device for balance  Cognition Arousal/Alertness: Awake/alert Behavior During Therapy: WFL for tasks assessed/performed, Anxious Overall Cognitive Status: Within Functional Limits for tasks assessed                                 General Comments: anxious        Exercises      General Comments        Pertinent Vitals/Pain Pain Assessment Pain Assessment: Faces Faces Pain Scale: Hurts little more Pain Location: back Pain Descriptors /  Indicators: Aching, Discomfort Pain Intervention(s): Limited activity within patient's tolerance, Monitored during session, Repositioned    Home Living                          Prior Function            PT Goals (current goals can now be found in the care plan section) Progress towards PT goals: Progressing toward goals    Frequency    Min 1X/week      PT Plan Current plan remains appropriate    Co-evaluation              AM-PAC PT "6 Clicks" Mobility   Outcome Measure  Help needed turning from your back to your side while in a flat bed without using bedrails?: A Lot Help needed moving from lying on your back to sitting on the side of a flat bed without using bedrails?: A Lot Help needed moving to and from a bed to a chair (including a wheelchair)?: A Little Help needed standing up from a chair using your arms (e.g., wheelchair or bedside chair)?: A Lot Help needed to walk in hospital room?: A Lot Help needed climbing 3-5 steps with a railing? : Total 6 Click Score: 12    End of Session   Activity Tolerance: Patient tolerated treatment well;Patient limited by pain Patient left: in bed;with call bell/phone within reach;with bed alarm set   PT Visit Diagnosis: Unsteadiness on feet (R26.81);Other abnormalities of gait and mobility (R26.89);Muscle weakness (generalized) (M62.81);History of falling (Z91.81);Difficulty in walking, not elsewhere classified (R26.2);Pain Pain - part of body:  (back)     Time: 9629-5284 PT Time Calculation (min) (ACUTE ONLY): 38 min  Charges:    $Gait Training: 8-22 mins $Therapeutic Activity: 23-37 mins PT General Charges $$ ACUTE PT VISIT: 1 Visit              Faye Ramsay, PT Acute Rehabilitation  Office: [phone]

## 2022-10-01 NOTE — TOC Progression Note (Signed)
Transition of Care [facility]) - Progression Note    Patient Details  Name: KYRSTAN MARTA MRN: [id number] Date of Birth: 1950/10/03  Transition of Care [facility]) CM/SW Contact  Shyra Emile, Olegario Messier, RN Phone Number: 10/01/2022, 2:12 PM  Clinical Narrative:  Patient chose [facility]-initiated insurance auth with [employer]-await auth. Pending auth [id number].     Expected Discharge Plan: Skilled Nursing Facility Barriers to Discharge: Insurance Authorization  Expected Discharge Plan and Services       Living arrangements for the past 2 months: Assisted Living Facility                                       Social Determinants of Health (SDOH) Interventions SDOH Screenings   Food Insecurity: No Food Insecurity (09/24/2022)  Housing: Low Risk  (09/24/2022)  Transportation Needs: No Transportation Needs (09/24/2022)  Utilities: Not At Risk (09/24/2022)  Financial Resource Strain: Low Risk  (10/18/2021)  Tobacco Use: Low Risk  (09/23/2022)    Readmission Risk Interventions     No data to display

## 2022-10-01 NOTE — Progress Notes (Signed)
PROGRESS NOTE    FELIPE ELMENDORF  QMV:[id number] DOB: 1950/07/24 DOA: 09/23/2022 PCP: Mila Palmer, MD  Brief Narrative: This 72 years old female with PMH significant for A-fib on chronic anticoagulation therapy, history of aortic valve stenosis s/p TAVR, hypertension, hypothyroidism, GERD, morbid obesity, chronic venous stasis presented in the ED for the evaluation of pain,  redness and purulent drainage from bilateral lower extremity ulcers.  Patient has bilateral lower extremity wounds and has noted increased foul-smelling drainage from the wounds.  She also reported chills and undocumented fever at home.  Patient also complains of rash under both breasts which is pruritic.  Patient is admitted for bilateral lower extremity cellulitis.  Assessment & Plan:   Principal Problem:   Sepsis due to cellulitis [facility]) Active Problems:   Acute on chronic diastolic CHF (congestive heart failure) (HCC)   Obesity, morbid (HCC)   Severe aortic stenosis   Atrial fibrillation, persistent (HCC)   Hypertension   Hypothyroidism  Sepsis secondary to bilateral cellulitis: Patient presented with tachycardia, tachypnea, bilateral lower extremity cellulitis and infected wounds with purulent drainage as well as leukocytosis. Empirically started on IV vancomycin and Rocephin. Continue IV fluid resuscitation. Consulted wound care for bilateral lower extremity wounds with purulent drainage. Blood cultures>  No growth so far. Antibiotics de-escalated to ceftriaxone. Sepsis physiology resolved. Antibiotics can be changed to oral abx at discharge.   Acute on chronic diastolic CHF: Patient presented with complaints of shortness of breath and wheezing. Chest x-ray showed mild interstitial edema and pulmonary vascular congestion. Last 2D Echo from 04/24 showed an LVEF of 55 to 60% Continue as needed bronchodilator therapy. Continue Lasix 40 mg po dialy   Hypothyroidism: Continue Synthroid.    Hypertension: Continue bisoprolol. Hydrochlorothiazide discontinued as she is on lasix.   Atrial fibrillation, persistent (HCC) Continue apixaban as primary prophylaxis for an acute stroke Continue bisoprolol for rate control. HR controlled.   Severe aortic stenosis Status post TAVR Stable.   Obesity, morbid (HCC) BMI 50.26 Complicates overall prognosis and care. Diet and exercise discussed in detail.   DVT prophylaxis:  Code Status: Full code Family Communication: No family at bed side. Disposition Plan:    Status is: Inpatient Remains inpatient appropriate because: Admitted for sepsis secondary to bilateral lower extremity cellulitis with ulcerative changes.  Cellulitis is improving. PT and  OT recommended SNF.  Awaiting insurance authorization.  Consultants:  None  Procedures: None  Antimicrobials:  Anti-infectives (From admission, onward)    Start     Dose/Rate Route Frequency Ordered Stop   09/24/22 1500  vancomycin (VANCOREADY) IVPB 1500 mg/300 mL  Status:  Discontinued        1,500 mg 150 mL/hr over 120 Minutes Intravenous Every 24 hours 09/24/22 0112 09/25/22 1128   09/24/22 1000  cefTRIAXone (ROCEPHIN) 2 g in sodium chloride 0.9 % 100 mL IVPB        2 g 200 mL/hr over 30 Minutes Intravenous Every 24 hours 09/24/22 0813 10/02/22 2359   09/23/22 2200  cefTRIAXone (ROCEPHIN) 1 g in sodium chloride 0.9 % 100 mL IVPB  Status:  Discontinued        1 g 200 mL/hr over 30 Minutes Intravenous Every 24 hours 09/23/22 1537 09/24/22 0813   09/23/22 1545  vancomycin (VANCOCIN) IVPB 1000 mg/200 mL premix  Status:  Discontinued        1,000 mg 200 mL/hr over 60 Minutes Intravenous  Once 09/23/22 1536 09/23/22 1536   09/23/22 1515  vancomycin (VANCOCIN) IVPB 1000 mg/200  mL premix        1,000 mg 200 mL/hr over 60 Minutes Intravenous  Once 09/23/22 1505 09/23/22 1636   09/23/22 1400  vancomycin (VANCOREADY) IVPB 2000 mg/400 mL  Status:  Discontinued        2,000 mg 200  mL/hr over 120 Minutes Intravenous  Once 09/23/22 1353 09/23/22 1505   09/23/22 1345  ceFEPIme (MAXIPIME) 2 g in sodium chloride 0.9 % 100 mL IVPB        2 g 200 mL/hr over 30 Minutes Intravenous  Once 09/23/22 1342 09/23/22 1510   09/23/22 1330  vancomycin (VANCOCIN) IVPB 1000 mg/200 mL premix  Status:  Discontinued        1,000 mg 200 mL/hr over 60 Minutes Intravenous  Once 09/23/22 1320 09/23/22 1526   09/23/22 1330  aztreonam (AZACTAM) 2 g in sodium chloride 0.9 % 100 mL IVPB  Status:  Discontinued        2 g 200 mL/hr over 30 Minutes Intravenous  Once 09/23/22 1320 09/23/22 1342      Subjective: Patient was seen and examined at bedside.Overnight events noted. Patient reports doing much better.  She slept all night. Redness and swelling in the lower extremities has significantly improved. She is awaiting SNF placement.  Objective: Vitals:   09/30/22 0631 09/30/22 1704 09/30/22 2146 10/01/22 0513  BP: 133/72 (!) 101/59 104/64 (!) 141/82  Pulse: 86 87 76 72  Resp:  18  (!) 22  Temp: (!) 97.3 F (36.3 C) 97.6 F (36.4 C) 97.6 F (36.4 C) 97.6 F (36.4 C)  TempSrc: Oral Oral Oral Oral  SpO2: 94% 93% 97% 92%  Weight:      Height:        Intake/Output Summary (Last 24 hours) at 10/01/2022 1211 Last data filed at 10/01/2022 0909 Gross per 24 hour  Intake 940 ml  Output 900 ml  Net 40 ml   Filed Weights   09/23/22 1351  Weight: (!) 137 kg    Examination:  General exam: Appears comfortable, deconditioned, morbidly obese, NAD. Respiratory system: CTA bilaterally, respiratory effort normal, RR 13 Cardiovascular system: S1 & S2 heard, Irregular  rhythm, no murmur. Gastrointestinal system: Abdomen is soft, non tender, non distended, bowel sounds present. Central nervous system: Alert and oriented x 3. No focal neurological deficits. Extremities: Venous stasis ulceration in both lower extremities with oozing Skin: No rashes, lesions or ulcers Psychiatry: Judgement and  insight appear normal. Mood & affect appropriate.     Data Reviewed: I have personally reviewed following labs and imaging studies  CBC: No results for input(s): "WBC", "NEUTROABS", "HGB", "HCT", "MCV", "PLT" in the last 168 hours.  Basic Metabolic Panel: No results for input(s): "NA", "K", "CL", "CO2", "GLUCOSE", "BUN", "CREATININE", "CALCIUM", "MG", "PHOS" in the last 168 hours.  GFR: Estimated Creatinine Clearance: 85.3 mL/min (by C-G formula based on SCr of 0.79 mg/dL). Liver Function Tests: No results for input(s): "AST", "ALT", "ALKPHOS", "BILITOT", "PROT", "ALBUMIN" in the last 168 hours.  No results for input(s): "LIPASE", "AMYLASE" in the last 168 hours. No results for input(s): "AMMONIA" in the last 168 hours. Coagulation Profile: No results for input(s): "INR", "PROTIME" in the last 168 hours.  Cardiac Enzymes: No results for input(s): "CKTOTAL", "CKMB", "CKMBINDEX", "TROPONINI" in the last 168 hours. BNP (last 3 results) No results for input(s): "PROBNP" in the last 8760 hours. HbA1C: No results for input(s): "HGBA1C" in the last 72 hours. CBG: No results for input(s): "GLUCAP" in the last 168 hours.  Lipid Profile: No results for input(s): "CHOL", "HDL", "LDLCALC", "TRIG", "CHOLHDL", "LDLDIRECT" in the last 72 hours. Thyroid Function Tests: No results for input(s): "TSH", "T4TOTAL", "FREET4", "T3FREE", "THYROIDAB" in the last 72 hours. Anemia Panel: No results for input(s): "VITAMINB12", "FOLATE", "FERRITIN", "TIBC", "IRON", "RETICCTPCT" in the last 72 hours. Sepsis Labs: No results for input(s): "PROCALCITON", "LATICACIDVEN" in the last 168 hours.   Recent Results (from the past 240 hour(s))  Culture, blood (routine x 2)     Status: None   Collection Time: 09/23/22  1:00 PM   Specimen: BLOOD LEFT ARM  Result Value Ref Range Status   Specimen Description   Final    BLOOD LEFT ARM Performed at [facility], [address], [place], [place] [address]     Special Requests   Final    BOTTLES DRAWN AEROBIC AND ANAEROBIC Blood Culture adequate volume Performed at [facility], [address], [place], [place] [address]    Culture   Final    NO GROWTH 5 DAYS Performed at [facility], [address], [place], [place] [address]    Report Status 09/28/2022 FINAL  Final  Resp panel by RT-PCR (RSV, Flu A&B, Covid) Anterior Nasal Swab     Status: None   Collection Time: 09/23/22  2:20 PM   Specimen: Anterior Nasal Swab  Result Value Ref Range Status   SARS Coronavirus 2 by RT PCR NEGATIVE NEGATIVE Final    Comment: (NOTE) SARS-CoV-2 target nucleic acids are NOT DETECTED.  The SARS-CoV-2 RNA is generally detectable in upper respiratory specimens during the acute phase of infection. The lowest concentration of SARS-CoV-2 viral copies this assay can detect is 138 copies/mL. A negative result does not preclude SARS-Cov-2 infection and should not be used as the sole basis for treatment or other patient management decisions. A negative result may occur with  improper specimen collection/handling, submission of specimen other than nasopharyngeal swab, presence of viral mutation(s) within the areas targeted by this assay, and inadequate number of viral copies(<138 copies/mL). A negative result must be combined with clinical observations, patient history, and epidemiological information. The expected result is Negative.  Fact Sheet for Patients:  BloggerCourse.com  Fact Sheet for Healthcare Providers:  SeriousBroker.it  This test is no t yet approved or cleared by the [place] FDA and  has been authorized for detection and/or diagnosis of SARS-CoV-2 by FDA under an Emergency Use Authorization (EUA). This EUA will remain  in effect (meaning this test can be used) for the duration of the COVID-19 declaration under Section 564(b)(1) of the Act, 21 U.S.C.section  360bbb-3(b)(1), unless the authorization is terminated  or revoked sooner.       Influenza A by PCR NEGATIVE NEGATIVE Final   Influenza B by PCR NEGATIVE NEGATIVE Final    Comment: (NOTE) The Xpert Xpress SARS-CoV-2/FLU/RSV plus assay is intended as an aid in the diagnosis of influenza from Nasopharyngeal swab specimens and should not be used as a sole basis for treatment. Nasal washings and aspirates are unacceptable for Xpert Xpress SARS-CoV-2/FLU/RSV testing.  Fact Sheet for Patients: BloggerCourse.com  Fact Sheet for Healthcare Providers: SeriousBroker.it  This test is not yet approved or cleared by the [place] FDA and has been authorized for detection and/or diagnosis of SARS-CoV-2 by FDA under an Emergency Use Authorization (EUA). This EUA will remain in effect (meaning this test can be used) for the duration of the COVID-19 declaration under Section 564(b)(1) of the Act, 21 U.S.C.  section 360bbb-3(b)(1), unless the authorization is terminated or revoked.     Resp Syncytial Virus by PCR NEGATIVE NEGATIVE Final    Comment: (NOTE) Fact Sheet for Patients: BloggerCourse.com  Fact Sheet for Healthcare Providers: SeriousBroker.it  This test is not yet approved or cleared by the [place] FDA and has been authorized for detection and/or diagnosis of SARS-CoV-2 by FDA under an Emergency Use Authorization (EUA). This EUA will remain in effect (meaning this test can be used) for the duration of the COVID-19 declaration under Section 564(b)(1) of the Act, 21 U.S.C. section 360bbb-3(b)(1), unless the authorization is terminated or revoked.  Performed at [facility], [address], [place], [place] [address]   Culture, blood (routine x 2)     Status: None   Collection Time: 09/23/22  7:15 PM   Specimen: BLOOD RIGHT ARM  Result Value Ref Range  Status   Specimen Description   Final    BLOOD RIGHT ARM Performed at [facility], [address], [place], [place] [address]    Special Requests   Final    BOTTLES DRAWN AEROBIC ONLY Blood Culture adequate volume Performed at [facility], [address], [place], [place] [address]    Culture   Final    NO GROWTH 5 DAYS Performed at [facility], [address] [place], [place] [address]    Report Status 09/28/2022 FINAL  Final    Radiology Studies: No results found.  Scheduled Meds:  apixaban  5 mg Oral BID   vitamin C  250 mg Oral QHS   bisoprolol  5 mg Oral Daily   carbidopa-levodopa  1 tablet Oral 2 times per day   carbidopa-levodopa  2 tablet Oral 2 times per day   vitamin B-12  1,000 mcg Oral Daily   furosemide  40 mg Oral Daily   levothyroxine  75 mcg Oral Q0600   multivitamin  1 tablet Oral BID   pantoprazole  40 mg Oral Daily   potassium chloride SA  40 mEq Oral Daily   Continuous Infusions:  cefTRIAXone (ROCEPHIN)  IV 2 g (10/01/22 0902)     LOS: 8 days    Time spent: 35 mins    Willeen Niece, MD [facility]   If 7PM-7AM, please contact night-coverage

## 2022-10-01 NOTE — Plan of Care (Signed)
°  Problem: Coping: °Goal: Level of anxiety will decrease °Outcome: Progressing °  °

## 2022-10-02 DIAGNOSIS — L0390 Cellulitis, unspecified: Secondary | ICD-10-CM | POA: Diagnosis not present

## 2022-10-02 DIAGNOSIS — D72829 Elevated white blood cell count, unspecified: Secondary | ICD-10-CM

## 2022-10-02 DIAGNOSIS — R6510 Systemic inflammatory response syndrome (SIRS) of non-infectious origin without acute organ dysfunction: Secondary | ICD-10-CM

## 2022-10-02 DIAGNOSIS — I5023 Acute on chronic systolic (congestive) heart failure: Secondary | ICD-10-CM

## 2022-10-02 DIAGNOSIS — I110 Hypertensive heart disease with heart failure: Secondary | ICD-10-CM

## 2022-10-02 DIAGNOSIS — A419 Sepsis, unspecified organism: Secondary | ICD-10-CM | POA: Diagnosis not present

## 2022-10-02 DIAGNOSIS — I4811 Longstanding persistent atrial fibrillation: Secondary | ICD-10-CM

## 2022-10-02 DIAGNOSIS — E669 Obesity, unspecified: Secondary | ICD-10-CM

## 2022-10-02 NOTE — Care Management Important Message (Signed)
Important Message  Patient Details IM Letter given. Name: Cindy Richmond MRN: [id number] Date of Birth: September 05, 1950   Medicare Important Message Given:  Yes     Caren Macadam 10/02/2022, 1:42 PM

## 2022-10-02 NOTE — TOC Transition Note (Signed)
Transition of Care [facility]) - CM/SW Discharge Note   Patient Details  Name: ANIQUA BOHART MRN: [id number] Date of Birth: April 08, 1950  Transition of Care [facility]) CM/SW Contact:  Lanier Clam, RN Phone Number: 10/02/2022, 12:27 PM   Clinical Narrative: Iline Oven for [facility]aryn Creamer [id number] Janie aware. MD updated.      Final next level of care: Skilled Nursing Facility Barriers to Discharge: No Barriers Identified   Patient Goals and CMS Choice CMS Medicare.gov Compare Post Acute Care list provided to:: Patient Choice offered to / list presented to : Patient, Adult Children  Discharge Placement                         Discharge Plan and Services Additional resources added to the After Visit Summary for                                       Social Determinants of Health (SDOH) Interventions SDOH Screenings   Food Insecurity: No Food Insecurity (09/24/2022)  Housing: Low Risk  (09/24/2022)  Transportation Needs: No Transportation Needs (09/24/2022)  Utilities: Not At Risk (09/24/2022)  Financial Resource Strain: Low Risk  (10/18/2021)  Tobacco Use: Low Risk  (09/23/2022)     Readmission Risk Interventions     No data to display

## 2022-10-02 NOTE — Progress Notes (Signed)
  Progress Note   Patient: Cindy Richmond [id number]:[id number] DOB: 1951-01-21 DOA: 09/23/2022     9 DOS: the patient was seen and examined on 10/02/2022   Brief hospital course: 72 years old female with PMH significant for A-fib on chronic anticoagulation therapy, history of aortic valve stenosis s/p TAVR, hypertension, hypothyroidism, GERD, morbid obesity, chronic venous stasis presented in the ED for the evaluation of pain, redness and purulent drainage from bilateral lower extremity ulcers. Patient has bilateral lower extremity wounds and has noted increased foul-smelling drainage from the wounds. She also reported chills and undocumented fever at home. Patient also complains of rash under both breasts which is pruritic. Patient is admitted for bilateral lower extremity cellulitis.   Assessment and Plan: Sepsis secondary to bilateral cellulitis: Patient presented with tachycardia, tachypnea, bilateral lower extremity cellulitis and infected wounds with purulent drainage as well as leukocytosis. Empirically started on IV vancomycin and Rocephin. Continue IV fluid resuscitation. Consulted wound care for bilateral lower extremity wounds with purulent drainage. Blood cultures>  No growth so far. Antibiotics de-escalated to ceftriaxone. Sepsis physiology resolved. Antibiotics can be changed to oral abx at discharge.   Acute on chronic diastolic CHF: Patient presented with complaints of shortness of breath and wheezing. Chest x-ray showed mild interstitial edema and pulmonary vascular congestion. Last 2D Echo from 04/24 showed an LVEF of 55 to 60% Continue as needed bronchodilator therapy. Continue Lasix 40 mg po daily as tolerated   Hypothyroidism: Continue Synthroid.   Hypertension: Continue bisoprolol. Hydrochlorothiazide discontinued as she is on lasix.   Atrial fibrillation, persistent (HCC) Continue apixaban as primary prophylaxis for an acute stroke Continue bisoprolol for rate control. HR  controlled.   Severe aortic stenosis Status post TAVR   Obesity, morbid (HCC) BMI 50.26 Complicates overall prognosis and care. Diet and exercise discussed in detail.  Diarrhea. -RN reported 3 BM over period of 30hrs -Pt denies abd pain, tolerating diet   Subjective: Complaining of diarrhea this AM  Physical Exam: Vitals:   10/01/22 1839 10/01/22 2028 10/02/22 0524 10/02/22 1314  BP: 113/65 108/70 134/84 115/73  Pulse: 86 76 92 62  Resp: (!) 21 (!) 22 (!) 24 18  Temp: 98.3 F (36.8 C) 97.6 F (36.4 C) 98.8 F (37.1 C) 98 F (36.7 C)  TempSrc: Oral Oral Oral Oral  SpO2: (!) 89% 91% 93% (!) 83%  Weight:      Height:       General exam: Awake, laying in bed, in nad Respiratory system: Normal respiratory effort, no wheezing Cardiovascular system: regular rate, s1, s2 Gastrointestinal system: Soft, nondistended, positive BS Central nervous system: CN2-12 grossly intact, strength intact Extremities: Perfused, no clubbing Skin: Normal skin turgor, no notable skin lesions seen Psychiatry: Mood normal // no visual hallucinations   Data Reviewed:  Labs reviewed: Na 140, K 4.1, Cr 0.74, WBC 8.4, Hgb 10.9  Family Communication: Pt in room, family not at bedside  Disposition: Status is: Inpatient Remains inpatient appropriate because: Severity of illness  Planned Discharge Destination: Skilled nursing facility    Author: Rickey Barbara, MD 10/02/2022 5:05 PM  For on call review [email or web address].

## 2022-10-02 NOTE — Hospital Course (Signed)
72 years old female with PMH significant for A-fib on chronic anticoagulation therapy, history of aortic valve stenosis s/p TAVR, hypertension, hypothyroidism, GERD, morbid obesity, chronic venous stasis presented in the ED for the evaluation of pain, redness and purulent drainage from bilateral lower extremity ulcers. Patient has bilateral lower extremity wounds and has noted increased foul-smelling drainage from the wounds. She also reported chills and undocumented fever at home. Patient also complains of rash under both breasts which is pruritic. Patient is admitted for bilateral lower extremity cellulitis.

## 2022-10-02 NOTE — Progress Notes (Signed)
Mobility Specialist - Progress Note   10/02/22 1150  Mobility  Activity Ambulated with assistance in hallway;Ambulated with assistance in room  Level of Assistance Maximum assist, patient does 25-49%  Assistive Device Front wheel walker  Distance Ambulated (ft) 8 ft  Range of Motion/Exercises Active Assistive  Activity Response Tolerated poorly  Mobility Referral Yes  $Mobility charge 1 Mobility  Mobility Specialist Start Time (ACUTE ONLY) 1130  Mobility Specialist Stop Time (ACUTE ONLY) 1150  Mobility Specialist Time Calculation (min) (ACUTE ONLY) 20 min   Pt was found in bed and agreeable to ambulate. Was max-A for bed mobility and CG for ambulation. Took a couple steps into the hallway and c/o knee pain and being unable to go any further. Once returning to room stated needing to use BSC for BM. At EOS was left on [facility] with call bell in reach.  Billey Chang Mobility Specialist

## 2022-10-03 DIAGNOSIS — L03115 Cellulitis of right lower limb: Secondary | ICD-10-CM

## 2022-10-03 DIAGNOSIS — R6510 Systemic inflammatory response syndrome (SIRS) of non-infectious origin without acute organ dysfunction: Secondary | ICD-10-CM | POA: Diagnosis not present

## 2022-10-03 DIAGNOSIS — L0390 Cellulitis, unspecified: Secondary | ICD-10-CM | POA: Diagnosis not present

## 2022-10-03 DIAGNOSIS — A419 Sepsis, unspecified organism: Secondary | ICD-10-CM | POA: Diagnosis not present

## 2022-10-03 DIAGNOSIS — L03116 Cellulitis of left lower limb: Secondary | ICD-10-CM

## 2022-10-03 LAB — BRAIN NATRIURETIC PEPTIDE: B Natriuretic Peptide: 125.7 pg/mL — ABNORMAL HIGH (ref 0.0–100.0)

## 2022-10-03 MED ORDER — BISOPROLOL FUMARATE 5 MG PO TABS: 5.0000 mg | ORAL_TABLET | Freq: Every day | ORAL | 0 refills | Status: AC

## 2022-10-03 NOTE — TOC Transition Note (Signed)
Transition of Care [facility]) - CM/SW Discharge Note   Patient Details  Name: Cindy Richmond MRN: [id number] Date of Birth: [birth year]  Transition of Care [facility]) CM/SW Contact:  Lanier Clam, RN Phone Number: 10/03/2022, 3:04 PM   Clinical Narrative:   d/c today [facility],received auth. Going to rm#[id number],report tel#[phone], option 0.[facility] called.No further CM needs.    Final next level of care: Skilled Nursing Facility Barriers to Discharge: No Barriers Identified   Patient Goals and CMS Choice CMS Medicare.gov Compare Post Acute Care list provided to:: Patient Choice offered to / list presented to : Patient, Adult Children  Discharge Placement PASRR number recieved: 10/01/22 PASRR number recieved: 10/01/22            Patient chooses bed at: [facility] Patient to be transferred to facility by: PTAR Name of family member notified: Patient Patient and family notified of of transfer: 10/03/22  Discharge Plan and Services Additional resources added to the After Visit Summary for                                       Social Determinants of Health (SDOH) Interventions SDOH Screenings   Food Insecurity: No Food Insecurity (09/24/2022)  Housing: Low Risk  (09/24/2022)  Transportation Needs: No Transportation Needs (09/24/2022)  Utilities: Not At Risk (09/24/2022)  Financial Resource Strain: Low Risk  (10/18/2021)  Tobacco Use: Low Risk  (09/23/2022)     Readmission Risk Interventions     No data to display

## 2022-10-03 NOTE — TOC Progression Note (Signed)
Transition of Care [facility]) - Progression Note    Patient Details  Name: Cindy Richmond MRN: [id number] Date of Birth: [birth year]  Transition of Care [facility]) CM/SW Contact  Devarion Mcclanahan, Olegario Messier, RN Phone Number: 10/03/2022, 9:20 AM  Clinical Narrative: Iline Oven for [facility]  rep Janie aware-awaitng if stable for d/c. MD updated.      Expected Discharge Plan: Skilled Nursing Facility Barriers to Discharge: No Barriers Identified  Expected Discharge Plan and Services       Living arrangements for the past 2 months: Assisted Living Facility                                       Social Determinants of Health (SDOH) Interventions SDOH Screenings   Food Insecurity: No Food Insecurity (09/24/2022)  Housing: Low Risk  (09/24/2022)  Transportation Needs: No Transportation Needs (09/24/2022)  Utilities: Not At Risk (09/24/2022)  Financial Resource Strain: Low Risk  (10/18/2021)  Tobacco Use: Low Risk  (09/23/2022)    Readmission Risk Interventions     No data to display

## 2022-10-03 NOTE — Progress Notes (Signed)
Called [facility] rehab room at Room 3206 at [phone] and tried to give report. There was no one answer the phone, but left the message and phone number to have them call me back.

## 2022-10-03 NOTE — Plan of Care (Signed)
  Problem: Clinical Measurements: Goal: Respiratory complications will improve Outcome: Progressing Goal: Cardiovascular complication will be avoided Outcome: Progressing   Problem: Activity: Goal: Risk for activity intolerance will decrease Outcome: Progressing   Problem: Fluid Volume: Goal: Hemodynamic stability will improve Outcome: Adequate for Discharge   Problem: Clinical Measurements: Goal: Diagnostic test results will improve Outcome: Adequate for Discharge Goal: Signs and symptoms of infection will decrease Outcome: Adequate for Discharge   Problem: Respiratory: Goal: Ability to maintain adequate ventilation will improve Outcome: Adequate for Discharge   Problem: Education: Goal: Knowledge of General Education information will improve Description: Including pain rating scale, medication(s)/side effects and non-pharmacologic comfort measures Outcome: Adequate for Discharge   Problem: Health Behavior/Discharge Planning: Goal: Ability to manage health-related needs will improve Outcome: Adequate for Discharge   Problem: Clinical Measurements: Goal: Ability to maintain clinical measurements within normal limits will improve Outcome: Adequate for Discharge Goal: Will remain free from infection Outcome: Adequate for Discharge   Problem: Elimination: Goal: Will not experience complications related to bowel motility Outcome: Adequate for Discharge Goal: Will not experience complications related to urinary retention Outcome: Adequate for Discharge   Problem: Pain Managment: Goal: General experience of comfort will improve Outcome: Adequate for Discharge   Problem: Safety: Goal: Ability to remain free from injury will improve Outcome: Adequate for Discharge   Problem: Skin Integrity: Goal: Risk for impaired skin integrity will decrease Outcome: Adequate for Discharge

## 2022-10-03 NOTE — Discharge Summary (Signed)
Physician Discharge Summary   Patient: Cindy Richmond MRN: [id number] DOB: 05/19/1950  Admit date:     09/23/2022  Discharge date: 10/03/22  Discharge Physician: Rickey Barbara   PCP: Mila Palmer, MD   Recommendations at discharge:    Follow up with PCP in 1-2 weeks  Discharge Diagnoses: Principal Problem:   Sepsis due to cellulitis [facility]) Active Problems:   Acute on chronic diastolic CHF (congestive heart failure) (HCC)   Obesity, morbid (HCC)   Severe aortic stenosis   Atrial fibrillation, persistent (HCC)   Hypertension   Hypothyroidism  Resolved Problems:   * No resolved hospital problems. *  Hospital Course: 72 years old female with PMH significant for A-fib on chronic anticoagulation therapy, history of aortic valve stenosis s/p TAVR, hypertension, hypothyroidism, GERD, morbid obesity, chronic venous stasis presented in the ED for the evaluation of pain, redness and purulent drainage from bilateral lower extremity ulcers. Patient has bilateral lower extremity wounds and has noted increased foul-smelling drainage from the wounds. She also reported chills and undocumented fever at home. Patient also complains of rash under both breasts which is pruritic. Patient is admitted for bilateral lower extremity cellulitis.   Assessment and Plan: Sepsis secondary to bilateral cellulitis: Patient presented with tachycardia, tachypnea, bilateral lower extremity cellulitis and infected wounds with purulent drainage as well as leukocytosis. Empirically started on IV vancomycin and Rocephin. Continue IV fluid resuscitation. Consulted wound care for bilateral lower extremity wounds with purulent drainage. Blood cultures>  No growth so far. Antibiotics de-escalated to ceftriaxone. Sepsis physiology resolved. Patient completed course of rocephin as of 8/7   Acute on chronic diastolic CHF: Patient presented with complaints of shortness of breath and wheezing. Chest x-ray showed mild  interstitial edema and pulmonary vascular congestion. Last 2D Echo from 04/24 showed an LVEF of 55 to 60% Continued as needed bronchodilator therapy. Continue Lasix 40 mg po daily as tolerated Pt reports being at baseline state by day of discharge   Hypothyroidism: Continue Synthroid.   Hypertension: Continue bisoprolol. Hydrochlorothiazide discontinued as she is on lasix.   Atrial fibrillation, persistent (HCC) Continue apixaban as primary prophylaxis for an acute stroke Continue bisoprolol for rate control. HR controlled.   Severe aortic stenosis Status post TAVR Per Cardiology documentation, pt has known chronic sob at baseline   Obesity, morbid (HCC) BMI 50.26 Complicates overall prognosis and care. Diet and exercise discussed in detail.   Diarrhea. -resolved    Consultants: WOC Procedures performed:   Disposition: Skilled nursing facility Diet recommendation:  Cardiac diet DISCHARGE MEDICATION: Allergies as of 10/03/2022       Reactions   Nickel Other (See Comments)   Nickel earrings, ear holes get infected   Penicillins Other (See Comments)   Allergy recorded when patient was a baby (exact allergic reaction not noted)        Medication List     STOP taking these medications    bisoprolol-hydrochlorothiazide 5-6.25 MG tablet Commonly known as: ZIAC   cephALEXin 500 MG capsule Commonly known as: Keflex   oxyCODONE-acetaminophen 5-325 MG tablet Commonly known as: PERCOCET/ROXICET       TAKE these medications    acetaminophen 325 MG tablet Commonly known as: TYLENOL Take 650 mg by mouth every 6 (six) hours as needed for fever (or pain).   AMBULATORY NON FORMULARY MEDICATION Lift chair Dx:  G20.A1   AMBULATORY NON FORMULARY MEDICATION Wheelchair Dx: G20.A1   apixaban 5 MG Tabs tablet Commonly known as: Eliquis Take 1 tablet (5 mg  total) by mouth 2 (two) times daily.   bisoprolol 5 MG tablet Commonly known as: ZEBETA Take 1 tablet (5 mg  total) by mouth daily. Start taking on: October 04, 2022   carbidopa-levodopa 25-100 MG tablet Commonly known as: SINEMET IR 2 AT 8AM, 2 AT NOON, 1 AT 4PM, AND 1 AT 8PM MAY TAKE EXTRA AS NEEDED What changed: See the new instructions.   esomeprazole 20 MG capsule Commonly known as: NEXIUM Take 20 mg by mouth daily.   furosemide 40 MG tablet Commonly known as: LASIX Take 40 mg by mouth in the morning. What changed: Another medication with the same name was removed. Continue taking this medication, and follow the directions you see here.   levothyroxine 75 MCG tablet Commonly known as: SYNTHROID Take 75 mcg by mouth daily before breakfast.   nystatin cream Commonly known as: MYCOSTATIN Apply 1 Application topically 3 (three) times daily as needed (for redness or rash- abdominal folds).   potassium chloride SA 20 MEQ tablet Commonly known as: KLOR-CON M Take 2 tablets (40 mEq total) by mouth daily. What changed: how much to take   PRESERVISION AREDS PO Take 1 capsule by mouth 2 (two) times daily.   Vitamin B-12 5000 MCG Tbdp Take 5,000 mcg by mouth daily.   vitamin C 250 MG tablet Commonly known as: ASCORBIC ACID Take 250 mg by mouth at bedtime.        Contact information for after-discharge care     Destination     [facility] Preferred SNF .   Service: Skilled Nursing Contact information: [address] [place] [address] [phone]                    Discharge Exam: Ceasar Mons Weights   09/23/22 1351 10/03/22 1203  Weight: (!) 137 kg (!) 142.7 kg   General exam: Awake, laying in bed, in nad Respiratory system: Normal respiratory effort, no wheezing Cardiovascular system: regular rate, s1, s2 Gastrointestinal system: Soft, nondistended, positive BS Central nervous system: CN2-12 grossly intact, strength intact Extremities: Perfused, no clubbing Skin: Normal skin turgor, no notable skin lesions  seen Psychiatry: Mood normal // no visual hallucinations   Condition at discharge: fair  The results of significant diagnostics from this hospitalization (including imaging, microbiology, ancillary and laboratory) are listed below for reference.   Imaging Studies: DG Chest Port 1 View  Result Date: 09/23/2022 CLINICAL DATA:  Short of breath EXAM: PORTABLE CHEST 1 VIEW COMPARISON:  Prior chest x-ray 04/28/2022 FINDINGS: Stable cardiomegaly. Evidence of prior trans arterial aortic valve replacement. Pulmonary vascular congestion with diffuse mild interstitial prominence is similar compared to prior. Low inspiratory volumes. No pneumothorax. No airspace infiltrate. Left reverse shoulder arthroplasty. IMPRESSION: 1. Pulmonary vascular congestion with mild interstitial edema, similar compared to prior. 2. Stable mild cardiomegaly with evidence of prior TAVR. Electronically Signed   By: Malachy Moan M.D.   On: 09/23/2022 14:23    Microbiology: Results for orders placed or performed during the hospital encounter of 09/23/22  Culture, blood (routine x 2)     Status: None   Collection Time: 09/23/22  1:00 PM   Specimen: BLOOD LEFT ARM  Result Value Ref Range Status   Specimen Description   Final    BLOOD LEFT ARM Performed at [facility], [address], [place], [place] [address]    Special Requests   Final    BOTTLES DRAWN AEROBIC AND ANAEROBIC Blood Culture adequate volume Performed at  [facility], [address] [place], [place] [address]    Culture   Final    NO GROWTH 5 DAYS Performed at [facility], [address], [place], [place] [address]    Report Status 09/28/2022 FINAL  Final  Resp panel by RT-PCR (RSV, Flu A&B, Covid) Anterior Nasal Swab     Status: None   Collection Time: 09/23/22  2:20 PM   Specimen: Anterior Nasal Swab  Result Value Ref Range Status   SARS Coronavirus 2 by RT PCR NEGATIVE NEGATIVE Final    Comment: (NOTE) SARS-CoV-2  target nucleic acids are NOT DETECTED.  The SARS-CoV-2 RNA is generally detectable in upper respiratory specimens during the acute phase of infection. The lowest concentration of SARS-CoV-2 viral copies this assay can detect is 138 copies/mL. A negative result does not preclude SARS-Cov-2 infection and should not be used as the sole basis for treatment or other patient management decisions. A negative result may occur with  improper specimen collection/handling, submission of specimen other than nasopharyngeal swab, presence of viral mutation(s) within the areas targeted by this assay, and inadequate number of viral copies(<138 copies/mL). A negative result must be combined with clinical observations, patient history, and epidemiological information. The expected result is Negative.  Fact Sheet for Patients:  BloggerCourse.com  Fact Sheet for Healthcare Providers:  SeriousBroker.it  This test is no t yet approved or cleared by the [place] FDA and  has been authorized for detection and/or diagnosis of SARS-CoV-2 by FDA under an Emergency Use Authorization (EUA). This EUA will remain  in effect (meaning this test can be used) for the duration of the COVID-19 declaration under Section 564(b)(1) of the Act, 21 U.S.C.section 360bbb-3(b)(1), unless the authorization is terminated  or revoked sooner.       Influenza A by PCR NEGATIVE NEGATIVE Final   Influenza B by PCR NEGATIVE NEGATIVE Final    Comment: (NOTE) The Xpert Xpress SARS-CoV-2/FLU/RSV plus assay is intended as an aid in the diagnosis of influenza from Nasopharyngeal swab specimens and should not be used as a sole basis for treatment. Nasal washings and aspirates are unacceptable for Xpert Xpress SARS-CoV-2/FLU/RSV testing.  Fact Sheet for Patients: BloggerCourse.com  Fact Sheet for Healthcare  Providers: SeriousBroker.it  This test is not yet approved or cleared by the [place] FDA and has been authorized for detection and/or diagnosis of SARS-CoV-2 by FDA under an Emergency Use Authorization (EUA). This EUA will remain in effect (meaning this test can be used) for the duration of the COVID-19 declaration under Section 564(b)(1) of the Act, 21 U.S.C. section 360bbb-3(b)(1), unless the authorization is terminated or revoked.     Resp Syncytial Virus by PCR NEGATIVE NEGATIVE Final    Comment: (NOTE) Fact Sheet for Patients: BloggerCourse.com  Fact Sheet for Healthcare Providers: SeriousBroker.it  This test is not yet approved or cleared by the [place] FDA and has been authorized for detection and/or diagnosis of SARS-CoV-2 by FDA under an Emergency Use Authorization (EUA). This EUA will remain in effect (meaning this test can be used) for the duration of the COVID-19 declaration under Section 564(b)(1) of the Act, 21 U.S.C. section 360bbb-3(b)(1), unless the authorization is terminated or revoked.  Performed at [facility], [address], [place], [place] [address]   Culture, blood (routine x 2)     Status: None   Collection Time: 09/23/22  7:15 PM   Specimen: BLOOD RIGHT ARM  Result Value Ref Range Status  Specimen Description   Final    BLOOD RIGHT ARM Performed at [facility], [address] [place], [place] [address]    Special Requests   Final    BOTTLES DRAWN AEROBIC ONLY Blood Culture adequate volume Performed at [facility], [address], [place], [place] [address]    Culture   Final    NO GROWTH 5 DAYS Performed at [facility], [address] [place], [place] [address]    Report Status 09/28/2022 FINAL  Final    Labs: CBC: Recent Labs  Lab 10/02/22 0440  WBC 8.4  HGB 10.9*  HCT 38.4  MCV 79.7*  PLT  154   Basic Metabolic Panel: Recent Labs  Lab 10/02/22 0440 10/03/22 0541  NA 140 142  K 4.1 4.5  CL 101 104  CO2 30 28  GLUCOSE 104* 85  BUN 20 18  CREATININE 0.74 0.78  CALCIUM 8.9 8.9  MG 2.2  --   PHOS 3.3  --    Liver Function Tests: Recent Labs  Lab 10/02/22 0440 10/03/22 0541  AST 20 18  ALT 10 9  ALKPHOS 81 76  BILITOT 0.5 0.5  PROT 6.2* 6.2*  ALBUMIN 2.9* 3.0*   CBG: No results for input(s): "GLUCAP" in the last 168 hours.  Discharge time spent: less than 30 minutes.  Signed: Rickey Barbara, MD [facility] 10/03/2022

## 2022-10-03 NOTE — Plan of Care (Signed)
  Problem: Safety: Goal: Ability to remain free from injury will improve Outcome: Progressing   Problem: Skin Integrity: Goal: Risk for impaired skin integrity will decrease Outcome: Progressing   

## 2022-10-10 ENCOUNTER — Telehealth: Payer: Self-pay | Admitting: Neurology

## 2022-10-10 NOTE — Telephone Encounter (Signed)
Spoke to patients son patient is getting out of hospital and suffering with issues with sleep, mobility and discomfort, patient is suffering with mood disorders and feeling very sad and lonely and hates being this way. I did discuss with son Dr. Arbutus Leas has tried to get patient enrolled in PT and patient would not go. Patient is on CPAP she refuses to wear so for him to call pulmonologist . I also told patients son to contact PCP about mood stabilizers like lexapro or Zoloft. I also mentioned to patients son that counseling is always a great idea with medication can make a big difference . Patients son is going to call back next week after patient is released from SNF. I also told son it has been over a year since we have seen patient in office so Dr. Arbutus Leas may want to see patient in person . I let him know Dr. Arbutus Leas is out of the office b ut I would let her know everything we discussed today

## 2022-10-10 NOTE — Telephone Encounter (Signed)
Pts son is calling in wanting to see if he can get a call back to speak about the concerns that he has about his mother.  This is some of the concerns that he is wanting some assistance with pt is have difficulty sleeping, behavior is different (do want to live any more, depression and do not want to take medication, very uncomfortable all the time).

## 2022-12-24 ENCOUNTER — Ambulatory Visit: Payer: Medicare PPO | Admitting: Student

## 2022-12-27 DEATH — deceased

## 2023-03-04 ENCOUNTER — Ambulatory Visit: Payer: Medicare PPO | Admitting: Neurology
